# Patient Record
Sex: Male | Born: 1945 | Race: White | Hispanic: No | Marital: Married | Smoking: Former smoker
Health system: Southern US, Community
[De-identification: ages and names within clinical notes are randomized; demographics above are authoritative.]

## PROBLEM LIST (undated history)

## (undated) DIAGNOSIS — C801 Malignant (primary) neoplasm, unspecified: Secondary | ICD-10-CM

## (undated) DIAGNOSIS — R600 Localized edema: Secondary | ICD-10-CM

## (undated) DIAGNOSIS — Z8679 Personal history of other diseases of the circulatory system: Secondary | ICD-10-CM

## (undated) DIAGNOSIS — J449 Chronic obstructive pulmonary disease, unspecified: Secondary | ICD-10-CM

## (undated) DIAGNOSIS — E291 Testicular hypofunction: Secondary | ICD-10-CM

## (undated) DIAGNOSIS — R0602 Shortness of breath: Secondary | ICD-10-CM

## (undated) DIAGNOSIS — E785 Hyperlipidemia, unspecified: Secondary | ICD-10-CM

## (undated) DIAGNOSIS — M1990 Unspecified osteoarthritis, unspecified site: Secondary | ICD-10-CM

## (undated) DIAGNOSIS — I10 Essential (primary) hypertension: Secondary | ICD-10-CM

## (undated) DIAGNOSIS — R112 Nausea with vomiting, unspecified: Secondary | ICD-10-CM

## (undated) DIAGNOSIS — J42 Unspecified chronic bronchitis: Secondary | ICD-10-CM

## (undated) DIAGNOSIS — M25569 Pain in unspecified knee: Secondary | ICD-10-CM

## (undated) DIAGNOSIS — K439 Ventral hernia without obstruction or gangrene: Secondary | ICD-10-CM

## (undated) DIAGNOSIS — R51 Headache: Secondary | ICD-10-CM

## (undated) DIAGNOSIS — Z85828 Personal history of other malignant neoplasm of skin: Secondary | ICD-10-CM

## (undated) DIAGNOSIS — I714 Abdominal aortic aneurysm, without rupture: Secondary | ICD-10-CM

## (undated) DIAGNOSIS — K759 Inflammatory liver disease, unspecified: Secondary | ICD-10-CM

## (undated) DIAGNOSIS — G4730 Sleep apnea, unspecified: Secondary | ICD-10-CM

## (undated) DIAGNOSIS — R609 Edema, unspecified: Secondary | ICD-10-CM

## (undated) DIAGNOSIS — J189 Pneumonia, unspecified organism: Secondary | ICD-10-CM

## (undated) DIAGNOSIS — K219 Gastro-esophageal reflux disease without esophagitis: Secondary | ICD-10-CM

## (undated) DIAGNOSIS — Z9889 Other specified postprocedural states: Secondary | ICD-10-CM

## (undated) DIAGNOSIS — H43819 Vitreous degeneration, unspecified eye: Secondary | ICD-10-CM

## (undated) HISTORY — DX: Hyperlipidemia, unspecified: E78.5

## (undated) HISTORY — PX: SKIN SURGERY: SHX2413

## (undated) HISTORY — PX: FOOT TENDON SURGERY: SHX958

## (undated) HISTORY — DX: Morbid (severe) obesity due to excess calories: E66.01

## (undated) HISTORY — DX: Headache: R51

## (undated) HISTORY — DX: Malignant (primary) neoplasm, unspecified: C80.1

## (undated) HISTORY — DX: Shortness of breath: R06.02

## (undated) HISTORY — DX: Gastro-esophageal reflux disease without esophagitis: K21.9

## (undated) HISTORY — DX: Testicular hypofunction: E29.1

## (undated) HISTORY — DX: Vitreous degeneration, unspecified eye: H43.819

## (undated) HISTORY — DX: Essential (primary) hypertension: I10

## (undated) HISTORY — PX: KNEE SURGERY: SHX244

## (undated) HISTORY — DX: Unspecified osteoarthritis, unspecified site: M19.90

## (undated) HISTORY — DX: Abdominal aortic aneurysm, without rupture: I71.4

## (undated) HISTORY — DX: Personal history of other malignant neoplasm of skin: Z85.828

---

## 2000-02-06 ENCOUNTER — Encounter: Payer: Self-pay | Admitting: Orthopedic Surgery

## 2000-02-06 ENCOUNTER — Ambulatory Visit (HOSPITAL_COMMUNITY): Admission: RE | Admit: 2000-02-06 | Discharge: 2000-02-06 | Payer: Self-pay | Admitting: Orthopedic Surgery

## 2000-03-03 ENCOUNTER — Emergency Department (HOSPITAL_COMMUNITY): Admission: EM | Admit: 2000-03-03 | Discharge: 2000-03-03 | Payer: Self-pay | Admitting: Emergency Medicine

## 2000-03-05 ENCOUNTER — Emergency Department (HOSPITAL_COMMUNITY): Admission: EM | Admit: 2000-03-05 | Discharge: 2000-03-05 | Payer: Self-pay | Admitting: Emergency Medicine

## 2000-03-11 ENCOUNTER — Emergency Department (HOSPITAL_COMMUNITY): Admission: EM | Admit: 2000-03-11 | Discharge: 2000-03-11 | Payer: Self-pay | Admitting: Emergency Medicine

## 2000-08-31 HISTORY — PX: UMBILICAL HERNIA REPAIR: SHX196

## 2002-08-01 ENCOUNTER — Ambulatory Visit (HOSPITAL_BASED_OUTPATIENT_CLINIC_OR_DEPARTMENT_OTHER): Admission: RE | Admit: 2002-08-01 | Discharge: 2002-08-01 | Payer: Self-pay | Admitting: Surgery

## 2005-11-17 ENCOUNTER — Encounter: Admission: RE | Admit: 2005-11-17 | Discharge: 2005-11-17 | Payer: Self-pay | Admitting: Emergency Medicine

## 2010-08-31 HISTORY — PX: ELBOW SURGERY: SHX618

## 2011-02-26 ENCOUNTER — Ambulatory Visit (HOSPITAL_COMMUNITY): Payer: 59 | Attending: Emergency Medicine | Admitting: Radiology

## 2011-02-26 DIAGNOSIS — R0602 Shortness of breath: Secondary | ICD-10-CM | POA: Insufficient documentation

## 2011-02-26 DIAGNOSIS — R079 Chest pain, unspecified: Secondary | ICD-10-CM

## 2011-02-26 MED ORDER — TECHNETIUM TC 99M TETROFOSMIN IV KIT
33.0000 | PACK | Freq: Once | INTRAVENOUS | Status: AC | PRN
  Administered 2011-02-26: 33 via INTRAVENOUS

## 2011-02-26 NOTE — Progress Notes (Signed)
[facility] SITE 3 NUCLEAR MED [address] [place] [address] [phone]  Cardiology Nuclear Med Study  William Hebert is a 65 y.o. male [id number] [birth year]   Nuclear Med Background Indication for Stress Test:  Evaluation for Ischemia History:  '92 GXT:OK per patient. Cardiac Risk Factors: Hypertension, Lipids and Obesity  Symptoms:  Chest Pain (last episode of chest discomfort was about 2-weeks ago), DOE and Fatigue.  Patient still getting over shingles.   Nuclear Pre-Procedure Caffeine/Decaff Intake:  None NPO After: 8:00am   Lungs:  Diminished, but clear.  O2 sat 92-97% with deep breaths. IV 0.9% NS with Angio Cath:  20g  IV Site: R Hand  IV Started by:  Bonnita Levan, RN  Chest Size (in):  54 Cup Size: n/a  Height: 5\' 10"  (1.778 m)  Weight:  308 lb (139.708 kg)  BMI:  Body mass index is 44.19 kg/(m^2). Tech Comments:  N/A    Nuclear Med Study 1 or 2 day study: 2 day  Stress Test Type:  Stress  Reading MD:  Charlton Haws MD Order Authorizing Provider:  Earl Lites, MD  Resting Radionuclide: Technetium 30m Tetrofosmin  Resting Radionuclide Dose: 33 mCi   Stress Radionuclide:  Technetium 42m Tetrofosmin  Stress Radionuclide Dose: 33 mCi           Stress Protocol Rest HR: 74 Stress HR: 139  Rest BP: 111/78 Stress BP: 179/64  Exercise Time (min): 6:01 METS: 7.0   Predicted Max HR: 157 bpm % Max HR: 88.54 bpm Rate Pressure Product: 66440   Dose of Adenosine (mg):  n/a Dose of Lexiscan: n/a mg  Dose of Atropine (mg): n/a Dose of Dobutamine: n/a mcg/kg/min (at max HR)  Stress Test Technologist: Smiley Houseman, CMA-N  Nuclear Technologist:  Doyne Keel, CNMT     Rest Procedure:  Myocardial perfusion imaging was performed at rest 45 minutes following the intravenous administration of Technetium 81m Tetrofosmin.  Rest ECG: PAC, RAD and IRBBB.  Stress Procedure:  The patient exercised for 6:01 on the treadmill utilizing the Bruce protocol.  The  patient stopped due to fatigue and dyspnea.  Peak O2 sat was 96%.   He denied any chest pain.  There were no diagnostic ST-T wave changes, only rare PAC's and fusion beats.  Technetium 84m Tetrofosmin was injected at peak exercise and myocardial perfusion imaging was performed after a brief delay.  Stress ECG: No significant change from baseline ECG  QPS Raw Data Images:  Normal; no motion artifact; normal heart/lung ratio. Stress Images:  There is decreased uptake in the inferior wall. Rest Images:  There is decreased uptake in the inferior wall. Subtraction (SDS):  No reversibility is appreciated. Transient Ischemic Dilatation (Normal <1.22):  1.03 Lung/Heart Ratio (Normal <0.45):  .35  Quantitative Gated Spect Images QGS EDV:  116 ml QGS ESV:  46 ml QGS cine images:  NL LV Function; NL Wall Motion QGS EF: 60%  Impression Exercise Capacity:  Fair exercise capacity. BP Response:  Normal blood pressure response. Clinical Symptoms:  There is dyspnea. ECG Impression:  No significant ST segment change suggestive of ischemia. 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

## 2011-03-03 ENCOUNTER — Ambulatory Visit (HOSPITAL_COMMUNITY): Payer: 59 | Attending: Emergency Medicine | Admitting: Radiology

## 2011-03-03 DIAGNOSIS — R0989 Other specified symptoms and signs involving the circulatory and respiratory systems: Secondary | ICD-10-CM

## 2011-03-03 MED ORDER — TECHNETIUM TC 99M TETROFOSMIN IV KIT
33.0000 | PACK | Freq: Once | INTRAVENOUS | Status: AC | PRN
  Administered 2011-03-03: 33 via INTRAVENOUS

## 2011-03-09 NOTE — Progress Notes (Signed)
nuc med study routed to Dr. Daub.03/09/11 William Hebert

## 2011-03-16 ENCOUNTER — Telehealth: Payer: Self-pay | Admitting: Cardiovascular Disease

## 2011-03-16 NOTE — Telephone Encounter (Signed)
Dr.Daub wanted this patient to be seen w/in 3-5 days: Abn stress test,pos.old MI,CP.  [facility] does not have any openings that soon.  Should we WI?

## 2011-03-18 ENCOUNTER — Telehealth: Payer: Self-pay | Admitting: *Deleted

## 2011-03-18 NOTE — Telephone Encounter (Signed)
Left msg to call back and we will get an app sooner if needed as advised by urgent care, office number left on msg.Alfonso Ramus RN

## 2011-03-26 ENCOUNTER — Encounter: Payer: Self-pay | Admitting: Emergency Medicine

## 2011-04-06 ENCOUNTER — Telehealth: Payer: Self-pay

## 2011-04-06 ENCOUNTER — Emergency Department (HOSPITAL_COMMUNITY)
Admission: EM | Admit: 2011-04-06 | Discharge: 2011-04-06 | Disposition: A | Discharge status: Home / Self Care | Payer: 59 | Attending: Emergency Medicine | Admitting: Emergency Medicine

## 2011-04-06 ENCOUNTER — Emergency Department (HOSPITAL_COMMUNITY): Payer: 59

## 2011-04-06 ENCOUNTER — Other Ambulatory Visit: Payer: Self-pay | Admitting: Emergency Medicine

## 2011-04-06 DIAGNOSIS — G4733 Obstructive sleep apnea (adult) (pediatric): Secondary | ICD-10-CM | POA: Insufficient documentation

## 2011-04-06 DIAGNOSIS — I2789 Other specified pulmonary heart diseases: Secondary | ICD-10-CM | POA: Insufficient documentation

## 2011-04-06 DIAGNOSIS — R0602 Shortness of breath: Secondary | ICD-10-CM | POA: Insufficient documentation

## 2011-04-06 LAB — POCT I-STAT TROPONIN I: Troponin i, poc: 0.01 ng/mL (ref 0.00–0.08)

## 2011-04-06 LAB — POCT I-STAT, CHEM 8
BUN: 23 mg/dL (ref 6–23)
Calcium, Ion: 1.18 mmol/L (ref 1.12–1.32)
Chloride: 103 mEq/L (ref 96–112)
Creatinine, Ser: 1.3 mg/dL (ref 0.50–1.35)
Glucose, Bld: 112 mg/dL — ABNORMAL HIGH (ref 70–99)
HCT: 36 % — ABNORMAL LOW (ref 39.0–52.0)
Hemoglobin: 12.2 g/dL — ABNORMAL LOW (ref 13.0–17.0)
Potassium: 3.1 mEq/L — ABNORMAL LOW (ref 3.5–5.1)
Sodium: 142 mEq/L (ref 135–145)
TCO2: 27 mmol/L (ref 0–100)

## 2011-04-06 MED ORDER — XENON XE 133 GAS
10.0000 | GAS_FOR_INHALATION | Freq: Once | RESPIRATORY_TRACT | Status: AC | PRN
  Administered 2011-04-06: 10 via RESPIRATORY_TRACT

## 2011-04-06 MED ORDER — TECHNETIUM TO 99M ALBUMIN AGGREGATED
6.0000 | Freq: Once | INTRAVENOUS | Status: AC | PRN
  Administered 2011-04-06: 6 via INTRAVENOUS

## 2011-04-06 NOTE — Telephone Encounter (Signed)
lmomtcb x1 for susan 

## 2011-04-07 LAB — D-DIMER, QUANTITATIVE (NOT AT ARMC): D-Dimer, Quant: 1.23 ug/mL-FEU — ABNORMAL HIGH (ref 0.00–0.48)

## 2011-04-07 NOTE — Telephone Encounter (Signed)
LMTCB-shows in EPIC patient has an appt on 04-24-11 with [facility]-need to confirm this with Darl Pikes or if she is requesting a sooner appt.

## 2011-04-08 NOTE — Telephone Encounter (Signed)
lmomtcb  

## 2011-04-09 ENCOUNTER — Telehealth: Payer: Self-pay | Admitting: *Deleted

## 2011-04-09 NOTE — Telephone Encounter (Signed)
Lupita Leash with [facility] with Dr. Cleta Alberts called following up on phone message from 04/06/11 which has been signed off on.  Lupita Leash states pt already had 1 consult scheduled with RA which was moved up to Aug 24 with [facility].  She would like to know if pt can be seen sooner by any pulmonary dr.  Mauri Reading has openings on Aug 15 at 9:30am but I see pt is scheduled for sleep consult with [facility] on Aug 24.  I asked Lupita Leash about this and she states this is not a sleep consult but is a pulm consult for SOB.  I advised MW can see pt on Aug 15 but he is not a sleep dr -- she is ok with this.  Consult with [facility] for Aug 24 cancelled and rescheduled for Aug 15 at 9:30am with MW -- Lupita Leash aware pt needs to arrive 15 min early, bring all meds, and current insurance card if he has one.  She verbalized understanding of this and will inform pt.  Also, states she has already faxed over records on this pt.

## 2011-04-09 NOTE — Telephone Encounter (Signed)
LMTCB and will sign msg per protocol

## 2011-04-11 ENCOUNTER — Emergency Department (HOSPITAL_COMMUNITY): Payer: 59

## 2011-04-11 ENCOUNTER — Observation Stay (HOSPITAL_COMMUNITY)
Admission: EM | Admit: 2011-04-11 | Discharge: 2011-04-12 | DRG: 305 | Disposition: A | Discharge status: Home / Self Care | Payer: 59 | Attending: Cardiology | Admitting: Cardiology

## 2011-04-11 DIAGNOSIS — E785 Hyperlipidemia, unspecified: Secondary | ICD-10-CM | POA: Insufficient documentation

## 2011-04-11 DIAGNOSIS — R51 Headache: Secondary | ICD-10-CM | POA: Insufficient documentation

## 2011-04-11 DIAGNOSIS — I10 Essential (primary) hypertension: Principal | ICD-10-CM | POA: Insufficient documentation

## 2011-04-11 DIAGNOSIS — G4733 Obstructive sleep apnea (adult) (pediatric): Secondary | ICD-10-CM | POA: Insufficient documentation

## 2011-04-11 LAB — POCT I-STAT, CHEM 8
BUN: 24 mg/dL — ABNORMAL HIGH (ref 6–23)
Calcium, Ion: 1.15 mmol/L (ref 1.12–1.32)
Chloride: 100 mEq/L (ref 96–112)
Creatinine, Ser: 1.2 mg/dL (ref 0.50–1.35)
Glucose, Bld: 104 mg/dL — ABNORMAL HIGH (ref 70–99)
HCT: 38 % — ABNORMAL LOW (ref 39.0–52.0)
Hemoglobin: 12.9 g/dL — ABNORMAL LOW (ref 13.0–17.0)
Potassium: 3.3 mEq/L — ABNORMAL LOW (ref 3.5–5.1)
Sodium: 140 mEq/L (ref 135–145)
TCO2: 30 mmol/L (ref 0–100)

## 2011-04-11 LAB — TROPONIN I: Troponin I: <0.3 ng/mL (ref <0.30)

## 2011-04-11 LAB — POCT I-STAT TROPONIN I
Troponin i, poc: 0 ng/mL (ref 0.00–0.08)
Troponin i, poc: 0.02 ng/mL (ref 0.00–0.08)

## 2011-04-11 LAB — CK TOTAL AND CKMB (NOT AT ARMC)
CK, MB: 3.5 ng/mL (ref 0.3–4.0)
Relative Index: 1.9 (ref 0.0–2.5)
Total CK: 184 U/L (ref 7–232)

## 2011-04-12 LAB — HEMOGLOBIN A1C
Hgb A1c MFr Bld: 6.4 % — ABNORMAL HIGH (ref <5.7)
Mean Plasma Glucose: 137 mg/dL — ABNORMAL HIGH (ref <117)

## 2011-04-12 LAB — BASIC METABOLIC PANEL
BUN: 17 mg/dL (ref 6–23)
CO2: 32 mEq/L (ref 19–32)
Calcium: 9.6 mg/dL (ref 8.4–10.5)
Chloride: 99 mEq/L (ref 96–112)
Creatinine, Ser: 1.04 mg/dL (ref 0.50–1.35)
GFR calc Af Amer: >60 mL/min (ref >60)
GFR calc non Af Amer: >60 mL/min (ref >60)
Glucose, Bld: 106 mg/dL — ABNORMAL HIGH (ref 70–99)
Potassium: 3 mEq/L — ABNORMAL LOW (ref 3.5–5.1)
Sodium: 141 mEq/L (ref 135–145)

## 2011-04-12 LAB — CARDIAC PANEL(CRET KIN+CKTOT+MB+TROPI)
CK, MB: 3.5 ng/mL (ref 0.3–4.0)
Relative Index: 2 (ref 0.0–2.5)
Total CK: 177 U/L (ref 7–232)
Troponin I: <0.3 ng/mL (ref <0.30)

## 2011-04-12 LAB — PRO B NATRIURETIC PEPTIDE: Pro B Natriuretic peptide (BNP): 189 pg/mL — ABNORMAL HIGH (ref 0–125)

## 2011-04-13 ENCOUNTER — Encounter: Payer: Self-pay | Admitting: Internal Medicine

## 2011-04-13 ENCOUNTER — Ambulatory Visit (INDEPENDENT_AMBULATORY_CARE_PROVIDER_SITE_OTHER): Payer: 59 | Admitting: Internal Medicine

## 2011-04-13 VITALS — BP 126/78 | HR 57 | Temp 97.4°F | Ht 71.0 in | Wt 317.6 lb

## 2011-04-13 DIAGNOSIS — R0600 Dyspnea, unspecified: Secondary | ICD-10-CM

## 2011-04-13 DIAGNOSIS — Z87891 Personal history of nicotine dependence: Secondary | ICD-10-CM | POA: Insufficient documentation

## 2011-04-13 DIAGNOSIS — R0989 Other specified symptoms and signs involving the circulatory and respiratory systems: Secondary | ICD-10-CM

## 2011-04-13 DIAGNOSIS — G4733 Obstructive sleep apnea (adult) (pediatric): Secondary | ICD-10-CM | POA: Insufficient documentation

## 2011-04-13 NOTE — Patient Instructions (Signed)
Take omeprazole 2 tablets 30-60 min before bfast if possible   Add pepcid 20 mg one at bedtime  GERD (REFLUX)  is an extremely common cause of respiratory symptoms, many times with no significant heartburn at all.    It can be treated with medication, but also with lifestyle changes including avoidance of late meals, excessive alcohol, smoking cessation, and avoid fatty foods, chocolate, peppermint, colas, red wine, and acidic juices such as orange juice.  NO MINT OR MENTHOL PRODUCTS SO NO COUGH DROPS  USE SUGARLESS CANDY INSTEAD (jolley ranchers or Stover's)  NO OIL BASED VITAMINS (NO fish oil)  Please schedule a follow up office visit in 4 weeks, sooner if needed with PFT's   In meantime avoid anything you know will bother your airways.

## 2011-04-13 NOTE — Progress Notes (Signed)
Subjective:     Patient ID: William Hebert, male   DOB: December 09, 1945, 65 y.o.   MRN: [id number]  HPI  80 yowm quit smoking 2007 with a h/o chronic bronchitis that mostly resolved then  no further resp problems problems at all @  around 280 lbs at that point could still do some jogging and still able to do yardowork until summer 2012.   04/13/2011 Initial pulmonary office eval in EMR era for new sob that occurred p cleaned out a house 3 weeks prior to ov, exposed  X  Two 8 hours work days and now improving but still sob room to room so went in for eval by Dr Cleta Alberts rec sleep study and v/q scan neg started cpap 04/08/11 then went to 04/11/11 with ha and double viz and high bp.  Assoc with severe cough x one week. No better with B2, no sign sputum production.  Sleeping much better on cpap.  Pt denies any significant sore throat, dysphagia, itching, sneezing,  nasal congestion or excess/ purulent secretions,  fever, chills, sweats, unintended wt loss, pleuritic or exertional cp, hempoptysis, orthopnea pnd or leg swelling.    Also denies any obvious fluctuation of symptoms with weather or environmental changes or other aggravating or alleviating factors.             Review of Systems     Objective:   Physical Exam Obese wm nad wt 312 04/13/11 HEENT: nl dentition, turbinates, and orophanx. Nl external ear canals without cough reflex   NECK :  without JVD/Nodes/TM/ nl carotid upstrokes bilaterally   LUNGS: no acc muscle use, clear to A and P bilaterally though    bs distant bilaterally, no increase in exp time,  without cough on insp or exp maneuvers   CV:  RRR  no s3 or murmur or increase in P2, no edema   ABD:  soft and nontender but massively obese with limied excursion in the supine position. No bruits or organomegaly, bowel sounds nl  MS:  warm without deformities, calf tenderness, cyanosis or clubbing  SKIN: warm and dry without lesions    NEURO:  alert, approp, no deficits        CXR 04/11/11 Cardiomegaly without acute disease.  BNP 04/12/11  189 Assessment:         Plan:

## 2011-04-14 ENCOUNTER — Telehealth: Payer: Self-pay | Admitting: Internal Medicine

## 2011-04-14 ENCOUNTER — Encounter: Payer: Self-pay | Admitting: Internal Medicine

## 2011-04-14 NOTE — Telephone Encounter (Signed)
Advised that the pepcid is OTC. Carron Curie, CMA

## 2011-04-14 NOTE — Assessment & Plan Note (Signed)
Unable to reproduce this in office but was assoc with severe cough and didn't respond to albuterol so this is most    Classic Upper airway cough syndrome, so named because it's frequently impossible to sort out how much is  CR/sinusitis with freq throat clearing (which can be related to primary GERD)   vs  causing  secondary (" extra esophageal")  GERD from wide swings in gastric pressure that occur with throat clearing, often  promoting self use of mint and menthol lozenges that reduce the lower esophageal sphincter tone and exacerbate the problem further in a cyclical fashion.   These are the same pts who not infrequently have failed to tolerate ace inhibitors,  dry powder inhalers or biphosphonates or report having reflux symptoms that don't respond to standard doses of PPI , and are easily confused as having aecopd or asthma flares,   Rec rx as gerd with ppi and diet and then return for pft's. I do not think he has significant or limiting copd

## 2011-04-14 NOTE — Assessment & Plan Note (Signed)
Better on cpap, deferred all questions re rx to Dr Nadara Eaton and if sleep evaluation requested from pulmonary will refer to one of my colleagues who does sleep

## 2011-04-14 NOTE — Telephone Encounter (Signed)
SPOUSE CALLED BACK AGAIN. UPSET THAT THIS RX WAS TO HAVE BEEN SENT IN YESTERDAY. SHE HAS BEEN TO THE PHARMACY TWICE NOW. WILL BE GOING BACK SOON AND WANTS TO PICK THIS UP ASAP. [facility] PH# [phone]. Hazel Sams

## 2011-04-15 ENCOUNTER — Ambulatory Visit (INDEPENDENT_AMBULATORY_CARE_PROVIDER_SITE_OTHER): Payer: 59 | Admitting: Internal Medicine

## 2011-04-15 ENCOUNTER — Institutional Professional Consult (permissible substitution): Payer: 59 | Admitting: Internal Medicine

## 2011-04-15 DIAGNOSIS — R0989 Other specified symptoms and signs involving the circulatory and respiratory systems: Secondary | ICD-10-CM

## 2011-04-15 DIAGNOSIS — R0600 Dyspnea, unspecified: Secondary | ICD-10-CM

## 2011-04-15 DIAGNOSIS — R0609 Other forms of dyspnea: Secondary | ICD-10-CM

## 2011-04-15 LAB — PULMONARY FUNCTION TEST

## 2011-04-15 NOTE — Progress Notes (Signed)
PFT done today. 

## 2011-04-15 NOTE — Discharge Summary (Signed)
NAMEHARSHAN, KEARLEY NO.:  [id number]  MEDICAL RECORD NO.:  [id number]  LOCATION:  3743                         FACILITY:  [facility]  PHYSICIAN:  Pamella Pert, MD DATE OF BIRTH:  [birth year]  DATE OF ADMISSION:  04/11/2011 DATE OF DISCHARGE:  04/12/2011                              DISCHARGE SUMMARY   23-hour admit.  BRIEF HISTORY:  Mr. Levy Cedano is a morbidly obese hypertensive 65- year-old gentleman who had been having recurrence of double vision, headache, and had presented to [facility] 2 days ago with uncontrolled hypertension.  He was treated with anti-hypertensive medications and he was eventually discharged home.  Because of shortness of breath, he also had a VQ scan of his lungs, which was negative for pulmonary embolism.  Yesterday again he had gone to work and suddenly had been having continued headaches for the last few days and it has gotten worse yesterday while at work.  His blood pressure at work was somewhere in the range of 190/123 mmHg.  His wife had called me and I had advised him to go to the emergency department to evaluate for hypertensive urgency.  In the emergency room, the patient was evaluated with a CT scan of the head, which essentially was unremarkable.  Chest x-ray revealed mild cardiomegaly without any acute cardiopulmonary disease.  The patient was admitted to telemetry.  COURSE IN THE HOSPITAL:  The patient's blood pressure immediately stabilized while in the hospital.  He received his usual medications along with addition of nifedipine 60 mg p.o. daily.  He did receive extra dose of Bystolic at 20 mg once a day.  With this, the blood pressure was better controlled.  The following day, that is today, he is asymptomatic and feels well.  He has no significant visual disturbances.  No nausea, no vomiting.  He still has mild headache.  Overall, he feels well.  PHYSICAL EXAMINATION:  VITAL SIGNS:   Temperature of 97.5, pulse was 51 beats per minute and regular, respiration was 16, blood pressure 154/80 mmHg. CARDIAC:  S1 and S2 was normal without any gallop or murmur. CHEST:  Clear. ABDOMEN:  Soft, obese.  No obvious organomegaly. EXTREMITIES:  Full range of movements without any edema or tenderness. VASCULAR:  Normal. NEUROLOGICAL:  There was no gross deficits.  His EKG demonstrated sinus bradycardia.  IMPRESSION: 1. Hypertensive urgency with headache, which resolved with     reinitiation of his Procardia XL, which he was on previously. 2. Morbid obesity with obstructive sleep apnea.  The patient has not     started using his CPAP yet. 3. Hyperlipidemia.  RECOMMENDATIONS:  At this point, the patient is felt stable for discharge.  I advised him not to do any heavy lifting.  I will see him back in the office in about a week.  DISCHARGE MEDICATIONS: 1. Procardia XL 60 mg p.o. daily. 2. Aspirin 81 mg p.o. daily. 3. Bystolic 10 mg p.o. daily. 4. Fish oil capsule 1200 mg p.o. daily. 5. Omeprazole 20 mg p.o. daily. 6. Osteo Bi-Flex one p.o. daily. 7. Potassium chloride 10 mEq p.o. daily. 8. Pravachol 40 mg p.o. at bedtime. 9. Testosterone  1% gel once a day.  Please also note the office note history and physical is also in the chart for the patient.     Pamella Pert, MD     JRG/MEDQ  D:  04/12/2011  T:  04/12/2011  Job:  [id number]  cc:   Brett Canales A. Cleta Alberts, M.D.  Electronically Signed by Yates Decamp MD on 04/15/2011 07:02:03 PM

## 2011-04-24 ENCOUNTER — Institutional Professional Consult (permissible substitution): Payer: 59 | Admitting: Pulmonary Disease

## 2011-04-29 ENCOUNTER — Other Ambulatory Visit: Payer: Self-pay | Admitting: Cardiology

## 2011-04-29 DIAGNOSIS — D3500 Benign neoplasm of unspecified adrenal gland: Secondary | ICD-10-CM

## 2011-05-06 ENCOUNTER — Ambulatory Visit
Admission: RE | Admit: 2011-05-06 | Discharge: 2011-05-06 | Disposition: A | Discharge status: Home / Self Care | Payer: 59 | Source: Ambulatory Visit | Attending: Cardiology | Admitting: Cardiology

## 2011-05-06 DIAGNOSIS — D3500 Benign neoplasm of unspecified adrenal gland: Secondary | ICD-10-CM

## 2011-05-07 ENCOUNTER — Encounter: Payer: Self-pay | Admitting: Internal Medicine

## 2011-05-08 ENCOUNTER — Institutional Professional Consult (permissible substitution): Payer: 59 | Admitting: Pulmonary Disease

## 2011-05-11 ENCOUNTER — Encounter: Payer: Self-pay | Admitting: Vascular Surgery

## 2011-05-11 ENCOUNTER — Ambulatory Visit: Payer: 59 | Admitting: Internal Medicine

## 2011-05-12 ENCOUNTER — Encounter: Payer: Self-pay | Admitting: Vascular Surgery

## 2011-05-12 ENCOUNTER — Ambulatory Visit (INDEPENDENT_AMBULATORY_CARE_PROVIDER_SITE_OTHER): Payer: 59 | Admitting: Vascular Surgery

## 2011-05-12 VITALS — BP 122/71 | HR 63 | Resp 22 | Ht 71.0 in | Wt 315.0 lb

## 2011-05-12 DIAGNOSIS — I714 Abdominal aortic aneurysm, without rupture: Secondary | ICD-10-CM

## 2011-05-12 NOTE — Progress Notes (Signed)
Subjective:     Patient ID: William Hebert, male   DOB: August 19, 1946, 65 y.o.   MRN: [id number]  HPI this 65 year old morbidly obese male patient was recently found to have a large abdominal aortic aneurysm. It measures 7 cm in maximum diameter. He had no previous knowledge of the aneurysm. He was evaluated by Dr. Nadara Eaton for severe hypertension which has been controlled. He had a cardiac evaluation including a nuclear stress test which was normal. He does have sleep apnea and uses a CPAP machine at night for the past 3 weeks. He has not been having abdominal or back symptoms.  Past Medical History  Diagnosis Date  . HTN (hypertension)   . GERD (gastroesophageal reflux disease)   . Testosterone deficiency   . Hyperlipidemia   . Morbidly obese   . Headache   . Double vision   . Shortness of breath   . Arthritis     History  Substance Use Topics  . Smoking status: Former Smoker -- 2.0 packs/day for 40 years    Types: Cigarettes    Quit date: 08/31/2005  . Smokeless tobacco: Not on file  . Alcohol Use: No    Family History  Problem Relation Age of Onset  . Cancer Mother   . Heart disease Maternal Aunt     No Known Allergies  Current outpatient prescriptions:aspirin 81 MG tablet, Take 81 mg by mouth daily.  , Disp: , Rfl: ;  BYSTOLIC 10 MG tablet, Take 1 tablet by mouth 2 (two) times daily. , Disp: , Rfl: ;  Misc Natural Products (OSTEO BI-FLEX ADV DOUBLE ST PO), Take by mouth.  , Disp: , Rfl: ;  NIFEDICAL XL 60 MG 24 hr tablet, Take 1 tablet by mouth Daily., Disp: , Rfl: ;  omeprazole (PRILOSEC) 20 MG capsule, Take 1 tablet by mouth Twice daily., Disp: , Rfl:  pravastatin (PRAVACHOL) 40 MG tablet, Take 1 tablet by mouth Twice daily., Disp: , Rfl: ;  spironolactone (ALDACTONE) 25 MG tablet, Take 25 mg by mouth daily.  , Disp: , Rfl: ;  TESTIM 50 MG/5GM GEL, 1 Tube Daily., Disp: , Rfl: ;  triamterene-hydrochlorothiazide (DYAZIDE) 37.5-25 MG per capsule, Take 1 capsule by mouth every morning.   , Disp: , Rfl: ;  diclofenac (VOLTAREN) 75 MG EC tablet, Take 1 tablet by mouth Twice daily., Disp: , Rfl:  fish oil-omega-3 fatty acids 1000 MG capsule, Take 1,200 mg by mouth daily.  , Disp: , Rfl: ;  potassium chloride (K-DUR,KLOR-CON) 10 MEQ tablet, Take 1 tablet by mouth Daily., Disp: , Rfl:   BP 122/71  Pulse 63  Resp 22  Ht 5\' 11"  (1.803 m)  Wt 315 lb (142.883 kg)  BMI 43.93 kg/m2  Body mass index is 43.93 kg/(m^2).        Review of Systems he complains of a 40 pound weight gain in the last 6 months, dizziness, occasional headaches, arthritis, joint pain, orthopnea, dyspnea on exertion, reflux esophagitis, urinary frequency, all other systems are negative a complete review of systems     Objective:   Physical Exam blood pressure 120/71 heart rate 63 respirations 22 Generally he is a morbidly obese male patient is in no apparent distress alert and oriented x3 HEENT exam normal for age Chest clear no rhonchi or wheezing Cardiovascular regular rhythm no murmurs Abdomen is obese no palpable masses Neurologic exams normal Skin free of rashes Extremities exam reveals 3+ femoral and posterior tibial pulses palpable. Musculoskeletal free of major deformities  I reviewed his CT scan of the abdomen without contrast. He has a long infrarenal neck in a very focal saccular abdominal aortic aneurysm measuring 7 cm in diameter with good iliac vessels    Assessment:    large infrarenal abdominal aortic aneurysm-good stent graft candidate    Plan:     Plan endovascular stent graft repair of abdominal aortic aneurysm on Wednesday, September 19. Risks and benefits of them thoroughly discussed with the patient and he would like to proceed

## 2011-05-18 ENCOUNTER — Telehealth: Payer: Self-pay | Admitting: Internal Medicine

## 2011-05-18 ENCOUNTER — Other Ambulatory Visit: Payer: Self-pay | Admitting: Vascular Surgery

## 2011-05-18 NOTE — Telephone Encounter (Signed)
Ms. Coby returned triage call.  Please call back after 12:15 pm today at [phone].  Antionette Fairy

## 2011-05-18 NOTE — Telephone Encounter (Signed)
ATC William Hebert's call, mailbox full so will have to try calling back

## 2011-05-18 NOTE — Telephone Encounter (Signed)
Spoke with pt's spouse and advised PFT not signed yet and MW back in the office tomorrow so will have him address then. She states needs these results asap b/c pt having surgery to repair AAA and we will need to have results faxed to Drs Jacinto Halim and Cleta Alberts. Please advise, thanks!

## 2011-05-18 NOTE — Telephone Encounter (Signed)
See previous telephone note. 

## 2011-05-18 NOTE — Telephone Encounter (Signed)
Tell pt :  Sorry for the delay not sure why it happened but the good news is the pft's are normal and Dr Hart Rochester  Has a copy on his computer (I will send message to [place] in epic).  Dr Charleston Poot needs a copy faxed.  No pulmonary contraindication to surgery.

## 2011-05-18 NOTE — Telephone Encounter (Signed)
Pt is aware PFT is normal per Dr. Sherene Sires and Dr. Hart Rochester has a copy as well as Dr. Charleston Poot.

## 2011-05-19 ENCOUNTER — Other Ambulatory Visit: Payer: Self-pay | Admitting: Vascular Surgery

## 2011-05-19 ENCOUNTER — Encounter (HOSPITAL_COMMUNITY)
Admission: RE | Admit: 2011-05-19 | Discharge: 2011-05-19 | Disposition: A | Discharge status: Home / Self Care | Payer: 59 | Source: Ambulatory Visit | Attending: Vascular Surgery | Admitting: Vascular Surgery

## 2011-05-19 ENCOUNTER — Ambulatory Visit (HOSPITAL_COMMUNITY)
Admission: RE | Admit: 2011-05-19 | Discharge: 2011-05-19 | Disposition: A | Discharge status: Home / Self Care | Payer: 59 | Source: Ambulatory Visit | Attending: Vascular Surgery | Admitting: Vascular Surgery

## 2011-05-19 DIAGNOSIS — I714 Abdominal aortic aneurysm, without rupture: Secondary | ICD-10-CM

## 2011-05-19 LAB — CBC
HCT: 39 % (ref 39.0–52.0)
Hemoglobin: 12.8 g/dL — ABNORMAL LOW (ref 13.0–17.0)
MCH: 29.6 pg (ref 26.0–34.0)
MCHC: 32.8 g/dL (ref 30.0–36.0)
MCV: 90.1 fL (ref 78.0–100.0)
Platelets: 176 K/uL (ref 150–400)
RBC: 4.33 MIL/uL (ref 4.22–5.81)
RDW: 12.7 % (ref 11.5–15.5)
WBC: 7.1 K/uL (ref 4.0–10.5)

## 2011-05-19 LAB — URINALYSIS, ROUTINE W REFLEX MICROSCOPIC
Bilirubin Urine: NEGATIVE
Glucose, UA: NEGATIVE mg/dL
Hgb urine dipstick: NEGATIVE
Ketones, ur: NEGATIVE mg/dL
Leukocytes, UA: NEGATIVE
Nitrite: NEGATIVE
Protein, ur: NEGATIVE mg/dL
Specific Gravity, Urine: 1.015 (ref 1.005–1.030)
Urobilinogen, UA: 1 mg/dL (ref 0.0–1.0)
pH: 7 (ref 5.0–8.0)

## 2011-05-19 LAB — BLOOD GAS, ARTERIAL
Acid-Base Excess: 5 mmol/L — ABNORMAL HIGH (ref 0.0–2.0)
Bicarbonate: 29.3 meq/L — ABNORMAL HIGH (ref 20.0–24.0)
Drawn by: 344381
FIO2: 0.21 %
O2 Saturation: 94.9 %
Patient temperature: 98.6
TCO2: 30.6 mmol/L (ref 0–100)
pCO2 arterial: 45.1 mmHg — ABNORMAL HIGH (ref 35.0–45.0)
pH, Arterial: 7.428 (ref 7.350–7.450)
pO2, Arterial: 69.7 mmHg — ABNORMAL LOW (ref 80.0–100.0)

## 2011-05-19 LAB — PROTIME-INR
INR: 0.91 (ref 0.00–1.49)
Prothrombin Time: 12.5 seconds (ref 11.6–15.2)

## 2011-05-19 LAB — COMPREHENSIVE METABOLIC PANEL
ALT: 27 U/L (ref 0–53)
AST: 20 U/L (ref 0–37)
Albumin: 3.7 g/dL (ref 3.5–5.2)
Alkaline Phosphatase: 76 U/L (ref 39–117)
BUN: 26 mg/dL — ABNORMAL HIGH (ref 6–23)
CO2: 29 mEq/L (ref 19–32)
Calcium: 10 mg/dL (ref 8.4–10.5)
Chloride: 100 mEq/L (ref 96–112)
Creatinine, Ser: 1.28 mg/dL (ref 0.50–1.35)
GFR calc Af Amer: >60 mL/min (ref >60)
GFR calc non Af Amer: 57 mL/min — ABNORMAL LOW (ref >60)
Glucose, Bld: 103 mg/dL — ABNORMAL HIGH (ref 70–99)
Potassium: 4.2 mEq/L (ref 3.5–5.1)
Sodium: 138 mEq/L (ref 135–145)
Total Bilirubin: 0.3 mg/dL (ref 0.3–1.2)
Total Protein: 6.7 g/dL (ref 6.0–8.3)

## 2011-05-19 LAB — SURGICAL PCR SCREEN
MRSA, PCR: NEGATIVE
Staphylococcus aureus: NEGATIVE

## 2011-05-19 LAB — ABO/RH: ABO/RH(D): O POS

## 2011-05-19 LAB — APTT: aPTT: 28 s (ref 24–37)

## 2011-05-20 ENCOUNTER — Inpatient Hospital Stay (HOSPITAL_COMMUNITY): Payer: 59

## 2011-05-20 ENCOUNTER — Inpatient Hospital Stay (HOSPITAL_COMMUNITY)
Admission: RE | Admit: 2011-05-20 | Discharge: 2011-05-21 | DRG: 238 | Disposition: A | Discharge status: Home / Self Care | Payer: 59 | Source: Ambulatory Visit | Attending: Vascular Surgery | Admitting: Vascular Surgery

## 2011-05-20 DIAGNOSIS — I714 Abdominal aortic aneurysm, without rupture, unspecified: Principal | ICD-10-CM | POA: Diagnosis present

## 2011-05-20 DIAGNOSIS — Z01818 Encounter for other preprocedural examination: Secondary | ICD-10-CM

## 2011-05-20 DIAGNOSIS — E785 Hyperlipidemia, unspecified: Secondary | ICD-10-CM | POA: Diagnosis present

## 2011-05-20 DIAGNOSIS — I10 Essential (primary) hypertension: Secondary | ICD-10-CM | POA: Diagnosis present

## 2011-05-20 DIAGNOSIS — Z01810 Encounter for preprocedural cardiovascular examination: Secondary | ICD-10-CM

## 2011-05-20 DIAGNOSIS — Z7982 Long term (current) use of aspirin: Secondary | ICD-10-CM

## 2011-05-20 DIAGNOSIS — K219 Gastro-esophageal reflux disease without esophagitis: Secondary | ICD-10-CM | POA: Diagnosis present

## 2011-05-20 DIAGNOSIS — Z01812 Encounter for preprocedural laboratory examination: Secondary | ICD-10-CM

## 2011-05-20 DIAGNOSIS — M129 Arthropathy, unspecified: Secondary | ICD-10-CM | POA: Diagnosis present

## 2011-05-20 DIAGNOSIS — G4733 Obstructive sleep apnea (adult) (pediatric): Secondary | ICD-10-CM | POA: Diagnosis present

## 2011-05-20 HISTORY — PX: ABDOMINAL AORTIC ANEURYSM REPAIR: SHX42

## 2011-05-20 LAB — BASIC METABOLIC PANEL
BUN: 30 mg/dL — ABNORMAL HIGH (ref 6–23)
CO2: 30 mEq/L (ref 19–32)
Calcium: 9.1 mg/dL (ref 8.4–10.5)
Chloride: 102 mEq/L (ref 96–112)
Creatinine, Ser: 1.48 mg/dL — ABNORMAL HIGH (ref 0.50–1.35)
GFR calc Af Amer: 58 mL/min — ABNORMAL LOW (ref >60)
GFR calc non Af Amer: 48 mL/min — ABNORMAL LOW (ref >60)
Glucose, Bld: 111 mg/dL — ABNORMAL HIGH (ref 70–99)
Potassium: 3.6 mEq/L (ref 3.5–5.1)
Sodium: 140 mEq/L (ref 135–145)

## 2011-05-20 LAB — CBC
HCT: 37.4 % — ABNORMAL LOW (ref 39.0–52.0)
Hemoglobin: 12.5 g/dL — ABNORMAL LOW (ref 13.0–17.0)
MCH: 30.6 pg (ref 26.0–34.0)
MCHC: 33.4 g/dL (ref 30.0–36.0)
MCV: 91.4 fL (ref 78.0–100.0)
Platelets: 141 10*3/uL — ABNORMAL LOW (ref 150–400)
RBC: 4.09 MIL/uL — ABNORMAL LOW (ref 4.22–5.81)
RDW: 13 % (ref 11.5–15.5)
WBC: 7.7 10*3/uL (ref 4.0–10.5)

## 2011-05-20 LAB — MAGNESIUM: Magnesium: 2.2 mg/dL (ref 1.5–2.5)

## 2011-05-20 LAB — PROTIME-INR
INR: 1.07 (ref 0.00–1.49)
Prothrombin Time: 14.1 seconds (ref 11.6–15.2)

## 2011-05-20 LAB — APTT: aPTT: 32 seconds (ref 24–37)

## 2011-05-21 ENCOUNTER — Other Ambulatory Visit: Payer: Self-pay

## 2011-05-21 ENCOUNTER — Other Ambulatory Visit: Payer: Self-pay | Admitting: Physician Assistant

## 2011-05-21 DIAGNOSIS — I714 Abdominal aortic aneurysm, without rupture: Secondary | ICD-10-CM

## 2011-05-21 LAB — BASIC METABOLIC PANEL
BUN: 18 mg/dL (ref 6–23)
CO2: 29 mEq/L (ref 19–32)
Calcium: 9.1 mg/dL (ref 8.4–10.5)
Chloride: 97 mEq/L (ref 96–112)
Creatinine, Ser: 1.12 mg/dL (ref 0.50–1.35)
GFR calc Af Amer: >60 mL/min (ref >60)
GFR calc non Af Amer: >60 mL/min (ref >60)
Glucose, Bld: 154 mg/dL — ABNORMAL HIGH (ref 70–99)
Potassium: 3.5 mEq/L (ref 3.5–5.1)
Sodium: 136 mEq/L (ref 135–145)

## 2011-05-21 LAB — CBC
HCT: 37.3 % — ABNORMAL LOW (ref 39.0–52.0)
Hemoglobin: 12.8 g/dL — ABNORMAL LOW (ref 13.0–17.0)
MCH: 31.1 pg (ref 26.0–34.0)
MCHC: 34.3 g/dL (ref 30.0–36.0)
MCV: 90.8 fL (ref 78.0–100.0)
Platelets: 151 10*3/uL (ref 150–400)
RBC: 4.11 MIL/uL — ABNORMAL LOW (ref 4.22–5.81)
RDW: 12.8 % (ref 11.5–15.5)
WBC: 10.2 10*3/uL (ref 4.0–10.5)

## 2011-05-22 LAB — CROSSMATCH
ABO/RH(D): O POS
Antibody Screen: NEGATIVE
Unit division: 0
Unit division: 0

## 2011-05-25 NOTE — Discharge Summary (Signed)
William Hebert, William Hebert NO.:  [id number]  MEDICAL RECORD NO.:  [id number]  LOCATION:  3316                         FACILITY:  [facility]  PHYSICIAN:  Quita Skye. Hart Rochester, M.D.  DATE OF BIRTH:  Mar 16, 1946  DATE OF ADMISSION:  05/20/2011 DATE OF DISCHARGE:  05/21/2011                              DISCHARGE SUMMARY   ADMISSION DIAGNOSIS:  Abdominal aortic aneurysm.  HISTORY OF PRESENT ILLNESS:  This is a 65 year old morbidly obese male who was recently found to have a large abdominal aortic aneurysm.  It measures 7 cm in maximum diameter.  He had no previous knowledge of the aneurysm.  He was evaluated by Dr. Jacinto Halim for severe hypertension which has been controlled.  He had a cardiac evaluation including a nuclear stress test which was normal.  He does have sleep apnea and uses a CPAP machine at night for the past 3 weeks.  He has not been having any abdominal or back symptoms.  HOSPITAL COURSE:  The patient was admitted to the hospital, taken to the operating room on May 20, 2011 where he underwent abdominal aortic aneurysm endovascular stent graft repair.  He tolerated the procedure well and was transported to the recovery room in satisfactory condition. By postoperative day #1, he was doing well.  He had strong pedal pulse signals with Doppler and bilateral groins were soft without hematoma and he does have good urine output.  His BUN and creatinine have improved at 18 and 1.12 respectively.  Otherwise, his postoperative course including increasing ambulation as well as increasing intake of solids without difficulty.  DISCHARGE INSTRUCTIONS:  He is discharged home with extensive instructions on wound care and progressive ambulation.  He is instructed not to drive or perform any heavy lifting for 1 month. When he is not showering,  he is to keep his bilateral groins dry with keeping a washcloth between his pannus  and groin.  DISCHARGE DIAGNOSES: 1. Abdominal  aortic aneurysm.     a.     Status post endovascular stent graft repair of abdominal      aortic aneurysm, May 20, 2011. 2. Hypertension. 3. Gastroesophageal reflux disease. 4. Testosterone deficiency. 5. Hyperlipidemia. 6. Morbid obesity. 7. Headaches. 8. History of double vision. 9. Shortness of breath. 10.Arthritis. 11.Obstructive sleep apnea and he does use a CPAP. 12.Remote tobacco use.  DISCHARGE MEDICATIONS: 1. Oxycodone 5 mg 1-2 tablets p.o. q.4-6 h. p.r.n. pain, #30, no     refill. 2. Spironolactone 25 mg p.o. daily. 3. Aspirin 81 mg p.o. daily. 4. Bystolic 10 mg p.o. twice a day. 5. Diclofenac 75 mg p.o. daily. 6. Famotidine 20 mg p.o. nightly. 7. Nifedipine 60 mg XL 1 tablet p.o. daily. 8. Omeprazole 20 mg 2 tablets p.o. q.a.m. 9. Osteo Bi-Flex 1 tablet daily. 10.Potassium chloride 10 mEq p.o. daily. 11.Pravachol 40 mg p.o. nightly. 12.Testosterone 1% gel one application to the skin daily. 13.Tramadol 50 mg p.o. t.i.d. 14.Triamterene/hydrochlorothiazide 37.5/25 mg p.o. q.a.m.  FOLLOWUP:  The patient will follow up with Dr. Hart Rochester in 4 weeks with postoperative stent graft protocol.     Newton Pigg, PA   ______________________________ Quita Skye Hart Rochester, M.D.    SE/MEDQ  D:  05/21/2011  T:  05/21/2011  Job:  [id number]  Electronically Signed by Newton Pigg PA on 05/21/2011 10:46:02 AM Electronically Signed by William Hebert M.D. on 05/25/2011 12:49:36 PM

## 2011-05-25 NOTE — Op Note (Signed)
NAMEHIEP, OLLIS NO.:  [id number]  MEDICAL RECORD NO.:  [id number]  LOCATION:  3316                         FACILITY:  [facility]  PHYSICIAN:  Quita Skye. Hart Rochester, M.D.  DATE OF BIRTH:  06/21/1946  DATE OF PROCEDURE:  05/20/2011 DATE OF DISCHARGE:                              OPERATIVE REPORT   PREOPERATIVE DIAGNOSIS:  Infrarenal abdominal aortic aneurysm.  POSTOPERATIVE DIAGNOSIS:  Infrarenal abdominal aortic aneurysm.  OPERATION:  Insertion of an aorto-bi-common iliac stent graft (EVAR) using a Gore-Excluder graft via bilateral common femoral artery percutaneous approach graft.  GRAFTS USED:  A.  A 31 x 14 x 17 cm main body via right side. B.  A 14 x 14 cm left contralateral limb. C.  A 32 x 4 cm proximal cuff with completion angiography and closure using ProGlide Perclose device.  SURGEONS: 1. Quita Skye. Hart Rochester, MD 2. Juleen China IV, MD  ANESTHESIA:  General endotracheal.  ESTIMATED BLOOD LOSS:  200 mL.  DESCRIPTION OF PROCEDURE:  The patient was taken to the operating room and placed in supine position at which time satisfactory general endotracheal anesthesia was administered.  Monitoring lines were inserted by Anesthesia.  The abdomen and groins were prepped with Betadine scrub and solution and draped in routine sterile manner. Bilateral common femoral artery cannulation was performed under ultrasound guidance with insertion of 2 ProGlide Perclose sutures, one at 11 o'clock and one at the 1 o'clock position on each side.  After insertion of the guidewires and placement of the Perclose sutures, 8- French sheath short on the right and long on the left was then placed over the guidewire.  Omni flush catheter was passed up the left side over the guidewire and an angiography was performed to measure appropriate lengths for the graft.  This was done using 20 mL of contrast at 20 mL per second demonstrating the aneurysm and the origin of both  hypogastric arteries.  Following this, a 31 x 14 mm x 17 cm graft was selected to deploy via the right side.  Prior to this, a wire exchange on the right was performed and an Amplatz Super Stiff wire inserted.  A 20-French sheath passed over the Amplatz wire and the device then positioned in the pararenal aorta.  A second angiogram was performed to verify the exact location of the renal arteries.  The graft was then deployed just distal to the renal arteries.  There was a 3-cm neck.  Following completion of this and deployment down to the contralateral gate, the contralateral gate was cannulated.  Because of the size of the patient, it was necessary to insert a 12-French dry seal sheath to facilitate cannulation of the contralateral gate.  This was done using the buddy-wire technique.  Following cannulation of the contralateral gate, a retrograde angiogram was performed to measure length and a 14 x 14 cm contralateral limb was selected, passed through the sheath, sheath retracted, and the limb deployed appropriately down to a point just proximal to left hypogastric.  The right ipsilateral limb was then completely deployed and all junctions were dilated with the Q-50 balloon.  Following this, a completion angiogram was performed which revealed  a small either dissection or type 1 leak along the left lateral proximal wall.  Additional gentle dilatation with the balloon catheter did not resolve this, therefore we inserted a 32 x 4 cm proximal cuff up to the renal arteries which gave another 7-10 mm of coverage.  Additional angiogram revealed no further leaks after dilating with the balloon.  Having completed the procedure, a completion angiogram was also performed which revealed no other Endo leaks and all leaks resolved prior to leaving the operating room.  Sheaths were then removed over the guidewires and the arteries closed using the ProGlide sutures.  It did require one-third suture placed  on the right side but two sutures on the left were adequate.  Following protamine administration, the sutures were cut, adequate hemostasis achieved, and the wounds closed in layers with Vicryl in a subcuticular fashion with Dermabond.  The patient had palpable posterior tibial pulses and was taken to the recovery room in stable condition.     Quita Skye Hart Rochester, M.D.     JDL/MEDQ  D:  05/20/2011  T:  05/20/2011  Job:  [id number]  Electronically Signed by Josephina Gip M.D. on 05/25/2011 12:49:32 PM

## 2011-06-22 ENCOUNTER — Encounter: Payer: Self-pay | Admitting: Vascular Surgery

## 2011-06-23 ENCOUNTER — Encounter: Payer: Self-pay | Admitting: Vascular Surgery

## 2011-06-23 ENCOUNTER — Ambulatory Visit (INDEPENDENT_AMBULATORY_CARE_PROVIDER_SITE_OTHER): Payer: 59 | Admitting: Vascular Surgery

## 2011-06-23 ENCOUNTER — Ambulatory Visit
Admission: RE | Admit: 2011-06-23 | Discharge: 2011-06-23 | Disposition: A | Discharge status: Home / Self Care | Payer: 59 | Source: Ambulatory Visit | Attending: Physician Assistant | Admitting: Physician Assistant

## 2011-06-23 VITALS — BP 123/68 | HR 78 | Temp 98.3°F | Ht 71.0 in | Wt 296.0 lb

## 2011-06-23 DIAGNOSIS — I714 Abdominal aortic aneurysm, without rupture: Secondary | ICD-10-CM

## 2011-06-23 MED ORDER — IOHEXOL 350 MG/ML SOLN
100.0000 mL | Freq: Once | INTRAVENOUS | Status: AC | PRN
Start: 2011-06-23 — End: 2011-06-23
  Administered 2011-06-23: 100 mL via INTRAVENOUS

## 2011-06-23 NOTE — Progress Notes (Signed)
Subjective:     Patient ID: William Hebert, male   DOB: 21-Dec-1945, 65 y.o.   MRN: [id number]  HPI this 65 year old male patient returns today one month post aortic stent grafting for a large infrarenal abdominal aortic aneurysm . He had an aorto biiliac graft inserted as well as a proximal cuff up to the renal arteries. This was done via percutaneous approach. Done well since his surgery. He is beginning to exercise more with daily walking and has lost 22 pounds.   Past Medical History  Diagnosis Date  . HTN (hypertension)   . GERD (gastroesophageal reflux disease)   . Testosterone deficiency   . Hyperlipidemia   . Morbidly obese   . Headache   . Double vision   . Shortness of breath   . Arthritis     History  Substance Use Topics  . Smoking status: Former Smoker -- 2.0 packs/day for 40 years    Types: Cigarettes    Quit date: 08/31/2005  . Smokeless tobacco: Not on file  . Alcohol Use: No    Family History  Problem Relation Age of Onset  . Cancer Mother   . Heart disease Maternal Aunt     No Known Allergies  Current outpatient prescriptions:aspirin 81 MG tablet, Take 81 mg by mouth daily.  , Disp: , Rfl: ;  BYSTOLIC 10 MG tablet, Take 1 tablet by mouth 2 (two) times daily. , Disp: , Rfl: ;  meloxicam (MOBIC) 15 MG tablet, Take 15 mg by mouth daily.  , Disp: , Rfl: ;  Misc Natural Products (OSTEO BI-FLEX ADV DOUBLE ST PO), Take by mouth.  , Disp: , Rfl: ;  NIFEDICAL XL 60 MG 24 hr tablet, Take 1 tablet by mouth Daily., Disp: , Rfl:  omeprazole (PRILOSEC) 20 MG capsule, Take 1 tablet by mouth Twice daily., Disp: , Rfl: ;  pravastatin (PRAVACHOL) 40 MG tablet, Take 1 tablet by mouth Twice daily., Disp: , Rfl: ;  spironolactone (ALDACTONE) 25 MG tablet, Take 25 mg by mouth daily.  , Disp: , Rfl: ;  TESTIM 50 MG/5GM GEL, 1 Tube Daily., Disp: , Rfl: ;  triamterene-hydrochlorothiazide (DYAZIDE) 37.5-25 MG per capsule, Take 1 capsule by mouth every morning.  , Disp: , Rfl:  diclofenac  (VOLTAREN) 75 MG EC tablet, Take 1 tablet by mouth Twice daily., Disp: , Rfl: ;  fish oil-omega-3 fatty acids 1000 MG capsule, Take 1,200 mg by mouth daily.  , Disp: , Rfl: ;  potassium chloride (K-DUR,KLOR-CON) 10 MEQ tablet, Take 1 tablet by mouth Daily., Disp: , Rfl:  No current facility-administered medications for this visit. Facility-Administered Medications Ordered in Other Visits: iohexol (OMNIPAQUE) 350 MG/ML injection 100 mL, 100 mL, Intravenous, Once PRN, Medication Radiologist, 100 mL at 06/23/11 1015  BP 123/68  Pulse 78  Temp(Src) 98.3 F (36.8 C) (Oral)  Ht 5\' 11"  (1.803 m)  Wt 296 lb (134.265 kg)  BMI 41.28 kg/m2  Body mass index is 41.28 kg/(m^2).        Review of Systems    Objective:   Physical Exam blood pressure 123/68 heart rate 78 respirations 16 Gen. he is an obese middle-aged male no apparent distress alert and oriented x3 Abdomen obese no palpable pulse no masses noted Femoral pulses are 3+ with well-healed puncture sites  I ordered a CT angiogram which I reviewed by computer. The stent graft is in excellent position. There are no type I leaks. He does have a type II leak which seems to  be coming from the inferior mesenteric artery and lumbar arteries. Diameter of the aneurysm is unchanged at 7.4 centimeter    Assessment:    doing well post aortic stent grafting for large infrarenal aortic aneurysm with type II endoleak    Plan:     #1 resume normal activities #2 return in 6 months with CT angiogram to monitor aneurysm size and type II endoleak

## 2011-08-26 ENCOUNTER — Ambulatory Visit (INDEPENDENT_AMBULATORY_CARE_PROVIDER_SITE_OTHER): Payer: 59

## 2011-08-26 DIAGNOSIS — R05 Cough: Secondary | ICD-10-CM

## 2011-08-26 DIAGNOSIS — R209 Unspecified disturbances of skin sensation: Secondary | ICD-10-CM

## 2011-08-26 DIAGNOSIS — E236 Other disorders of pituitary gland: Secondary | ICD-10-CM

## 2011-08-26 DIAGNOSIS — J449 Chronic obstructive pulmonary disease, unspecified: Secondary | ICD-10-CM

## 2011-09-29 ENCOUNTER — Other Ambulatory Visit: Payer: Self-pay | Admitting: Emergency Medicine

## 2011-10-08 ENCOUNTER — Other Ambulatory Visit: Payer: Self-pay | Admitting: Neurosurgery

## 2011-10-08 DIAGNOSIS — M542 Cervicalgia: Secondary | ICD-10-CM

## 2011-10-12 ENCOUNTER — Ambulatory Visit
Admission: RE | Admit: 2011-10-12 | Discharge: 2011-10-12 | Disposition: A | Discharge status: Home / Self Care | Payer: 59 | Source: Ambulatory Visit | Attending: Neurosurgery | Admitting: Neurosurgery

## 2011-10-12 DIAGNOSIS — M542 Cervicalgia: Secondary | ICD-10-CM

## 2011-10-13 ENCOUNTER — Inpatient Hospital Stay: Admission: RE | Admit: 2011-10-13 | Payer: 59 | Source: Ambulatory Visit

## 2011-10-28 ENCOUNTER — Other Ambulatory Visit: Payer: Self-pay | Admitting: *Deleted

## 2011-10-28 DIAGNOSIS — I714 Abdominal aortic aneurysm, without rupture: Secondary | ICD-10-CM

## 2011-10-28 DIAGNOSIS — Z9889 Other specified postprocedural states: Secondary | ICD-10-CM

## 2011-10-28 DIAGNOSIS — Z8679 Personal history of other diseases of the circulatory system: Secondary | ICD-10-CM

## 2011-11-28 ENCOUNTER — Ambulatory Visit (INDEPENDENT_AMBULATORY_CARE_PROVIDER_SITE_OTHER): Payer: 59 | Admitting: Emergency Medicine

## 2011-11-28 VITALS — BP 124/76 | HR 54 | Temp 97.7°F | Resp 14 | Ht 72.0 in | Wt 306.2 lb

## 2011-11-28 DIAGNOSIS — E669 Obesity, unspecified: Secondary | ICD-10-CM

## 2011-11-28 DIAGNOSIS — R609 Edema, unspecified: Secondary | ICD-10-CM

## 2011-11-28 DIAGNOSIS — E291 Testicular hypofunction: Secondary | ICD-10-CM

## 2011-11-28 DIAGNOSIS — I10 Essential (primary) hypertension: Secondary | ICD-10-CM

## 2011-11-28 LAB — POCT CBC
Granulocyte percent: 69.8 %G (ref 37–80)
HCT, POC: 43.5 % (ref 43.5–53.7)
Hemoglobin: 13.9 g/dL — AB (ref 14.1–18.1)
Lymph, poc: 2 (ref 0.6–3.4)
MCH, POC: 28.7 pg (ref 27–31.2)
MCHC: 32 g/dL (ref 31.8–35.4)
MCV: 89.8 fL (ref 80–97)
MID (cbc): 0.5 (ref 0–0.9)
MPV: 8.6 fL (ref 0–99.8)
POC Granulocyte: 5.7 (ref 2–6.9)
POC LYMPH PERCENT: 24.5 %L (ref 10–50)
POC MID %: 5.7 %M (ref 0–12)
Platelet Count, POC: 224 10*3/uL (ref 142–424)
RBC: 4.84 M/uL (ref 4.69–6.13)
RDW, POC: 13.8 %
WBC: 8.2 10*3/uL (ref 4.6–10.2)

## 2011-11-28 LAB — GLUCOSE, POCT (MANUAL RESULT ENTRY): POC Glucose: 103

## 2011-11-28 NOTE — Progress Notes (Signed)
  Subjective:    Patient ID: William Hebert, male    DOB: 1945-09-23, 66 y.o.   MRN: [id number]  HPI patient enters with multiple complaints. First he is having swelling of his legs. He was taken off her diuretics recently. His blood pressures have been well controlled on the current regimen of blood pressure medications. He had recent surgery involving the ulnar nerve of his right arm and is currently in a splint for this. He also questions about his skin he would like checked. He also needs a recheck on his testosterone level since he is on testosterone replacement   Review of Systems he denies chest pain shortness of breath he has had pain in the left side of his abdomen. Apparently he coughed very hard and felt an immediate pain just below the ribs on the left side.     Objective:   Physical Exam  Constitutional: He appears well-developed.  HENT:  Head: Normocephalic.  Eyes: Pupils are equal, round, and reactive to light.  Neck: No thyromegaly present.  Cardiovascular: Normal rate and regular rhythm.  Exam reveals no gallop and no friction rub.   No murmur heard. Pulmonary/Chest: Breath sounds normal. No respiratory distress. He has no wheezes. He has no rales. He exhibits no tenderness.  Musculoskeletal: He exhibits edema.       The right arm is in a splint . He has tenderness beneath the costal margin on the left believe there is a defect there.  Skin:       Skin exam reveals a basal cell which is present to the right side of the nose. There is also a 3 mm pigmented area on the right side of his flank.          Assessment & Plan:   Patient has significant peripheral edema. We'll check a venous Doppler to rule out clot since he has been very sedentary since his recent surgeries for check of the meds as well as his testosterone levels. Is currently on testosterone replacement. And these levels checked. They're going to make an appointment to see the dermatologist because of the  lesion on the right side of his nose as well as a dark pigmented lesion right flank he

## 2011-11-29 LAB — PSA: PSA: 0.49 ng/mL (ref <=4.00)

## 2011-11-29 LAB — BASIC METABOLIC PANEL
BUN: 19 mg/dL (ref 6–23)
CO2: 27 mEq/L (ref 19–32)
Calcium: 9.6 mg/dL (ref 8.4–10.5)
Chloride: 101 mEq/L (ref 96–112)
Creat: 1.15 mg/dL (ref 0.50–1.35)
Glucose, Bld: 98 mg/dL (ref 70–99)
Potassium: 3.6 mEq/L (ref 3.5–5.3)
Sodium: 139 mEq/L (ref 135–145)

## 2011-11-30 LAB — TESTOSTERONE, FREE, TOTAL, SHBG
Sex Hormone Binding: 27 nmol/L (ref 13–71)
Testosterone, Free: 40.9 pg/mL — ABNORMAL LOW (ref 47.0–244.0)
Testosterone-% Free: 2.1 % (ref 1.6–2.9)
Testosterone: 192.86 ng/dL — ABNORMAL LOW (ref 250–890)

## 2011-12-01 ENCOUNTER — Encounter: Payer: Self-pay | Admitting: Radiology

## 2011-12-03 ENCOUNTER — Other Ambulatory Visit: Payer: Self-pay

## 2011-12-04 ENCOUNTER — Ambulatory Visit
Admission: RE | Admit: 2011-12-04 | Discharge: 2011-12-04 | Disposition: A | Discharge status: Home / Self Care | Payer: 59 | Source: Ambulatory Visit | Attending: Emergency Medicine | Admitting: Emergency Medicine

## 2011-12-04 DIAGNOSIS — R609 Edema, unspecified: Secondary | ICD-10-CM

## 2011-12-05 ENCOUNTER — Telehealth: Payer: Self-pay | Admitting: Radiology

## 2011-12-05 NOTE — Telephone Encounter (Signed)
Notified pt of normal exam.

## 2011-12-05 NOTE — Telephone Encounter (Signed)
Message copied by Levon Hedger A on Sat Dec 05, 2011  4:16 PM ------      Message from: Lesle Chris A      Created: Sat Dec 05, 2011 10:52 AM       Please call no evidence of DVT on exam to

## 2011-12-17 ENCOUNTER — Other Ambulatory Visit: Payer: Self-pay

## 2011-12-17 ENCOUNTER — Telehealth: Payer: Self-pay

## 2011-12-17 NOTE — Telephone Encounter (Signed)
Patient is waiting for a call with the outcome of his test.

## 2011-12-18 NOTE — Telephone Encounter (Signed)
Spoke with patient and they said that his legs are still swelling and would like to know what they should do.  They think that his meds might be causing the swelling?  He seen Dr. Nadara Eaton last week and he said everything on his end was good.

## 2011-12-18 NOTE — Telephone Encounter (Signed)
Pt William Hebert and I gave him message from Dr Cleta Alberts. Pt agrees to check w/Dr Ganji's office

## 2011-12-18 NOTE — Telephone Encounter (Signed)
No answer or voice mail available.

## 2011-12-18 NOTE — Telephone Encounter (Signed)
They need to call Dr. Jerre Simon office and see how he wants to adjust his diuretics.

## 2011-12-23 ENCOUNTER — Other Ambulatory Visit: Payer: Self-pay | Admitting: Emergency Medicine

## 2011-12-23 ENCOUNTER — Other Ambulatory Visit: Payer: Self-pay | Admitting: Vascular Surgery

## 2011-12-23 DIAGNOSIS — L989 Disorder of the skin and subcutaneous tissue, unspecified: Secondary | ICD-10-CM

## 2011-12-24 LAB — BUN: BUN: 18 mg/dL (ref 6–23)

## 2011-12-24 LAB — CREATININE, SERUM: Creat: 1.09 mg/dL (ref 0.50–1.35)

## 2011-12-28 ENCOUNTER — Encounter: Payer: Self-pay | Admitting: Vascular Surgery

## 2011-12-29 ENCOUNTER — Encounter: Payer: Self-pay | Admitting: Vascular Surgery

## 2011-12-29 ENCOUNTER — Ambulatory Visit
Admission: RE | Admit: 2011-12-29 | Discharge: 2011-12-29 | Disposition: A | Discharge status: Home / Self Care | Payer: 59 | Source: Ambulatory Visit | Attending: Vascular Surgery | Admitting: Vascular Surgery

## 2011-12-29 ENCOUNTER — Ambulatory Visit (INDEPENDENT_AMBULATORY_CARE_PROVIDER_SITE_OTHER): Payer: 59 | Admitting: Vascular Surgery

## 2011-12-29 VITALS — BP 156/79 | HR 56 | Temp 98.2°F | Ht 70.0 in | Wt 308.0 lb

## 2011-12-29 DIAGNOSIS — Z8679 Personal history of other diseases of the circulatory system: Secondary | ICD-10-CM

## 2011-12-29 DIAGNOSIS — Z48812 Encounter for surgical aftercare following surgery on the circulatory system: Secondary | ICD-10-CM

## 2011-12-29 DIAGNOSIS — I714 Abdominal aortic aneurysm, without rupture, unspecified: Secondary | ICD-10-CM

## 2011-12-29 MED ORDER — IOHEXOL 350 MG/ML SOLN
100.0000 mL | Freq: Once | INTRAVENOUS | Status: AC | PRN
  Administered 2011-12-29: 100 mL via INTRAVENOUS

## 2011-12-29 NOTE — Progress Notes (Signed)
Subjective:     Patient ID: CEM KOSMAN, male   DOB: 1945-10-15, 66 y.o.   MRN: [id number]  HPI this 66 year old male returns for continued followup regarding his aortic stent graft was placed in August of 2012 are 7.4 cm abdominal aortic aneurysm. He has no symptoms of abdominal or back pain at this time. I saw him last in October of 2012 and a CT angiogram at that time revealed a type II endoleak from the I MA or lumbar branches. He has been doing well since we last saw him  Past Medical History  Diagnosis Date  . HTN (hypertension)   . GERD (gastroesophageal reflux disease)   . Testosterone deficiency   . Hyperlipidemia   . Morbidly obese   . Headache   . Double vision   . Shortness of breath   . Arthritis     History  Substance Use Topics  . Smoking status: Former Smoker -- 2.0 packs/day for 40 years    Types: Cigarettes    Quit date: 08/31/2005  . Smokeless tobacco: Not on file  . Alcohol Use: No    Family History  Problem Relation Age of Onset  . Cancer Mother   . Heart disease Maternal Aunt     No Known Allergies  Current outpatient prescriptions:Ascorbic Acid (VITAMIN C) 1000 MG tablet, Take 1,000 mg by mouth daily., Disp: , Rfl: ;  B Complex Vitamins (VITAMIN B COMPLEX PO), Take by mouth daily., Disp: , Rfl: ;  b complex vitamins tablet, Take 1 tablet by mouth daily., Disp: , Rfl: ;  BYSTOLIC 10 MG tablet, Take 1 tablet by mouth 2 (two) times daily. 20mg  bid, Disp: , Rfl: ;  BYSTOLIC 20 MG TABS, Take 20 mg by mouth 2 (two) times daily., Disp: , Rfl:  diclofenac (VOLTAREN) 75 MG EC tablet, Take 1 tablet by mouth Twice daily., Disp: , Rfl: ;  famotidine (PEPCID) 20 MG tablet, Take 20 mg by mouth daily. , Disp: , Rfl: ;  fish oil-omega-3 fatty acids 1000 MG capsule, Take 1,200 mg by mouth daily.  , Disp: , Rfl: ;  Misc Natural Products (OSTEO BI-FLEX ADV DOUBLE ST PO), Take by mouth.  , Disp: , Rfl: ;  NIFEDICAL XL 60 MG 24 hr tablet, Take 1 tablet by mouth Daily., Disp: ,  Rfl:  omeprazole (PRILOSEC) 20 MG capsule, Take 1 tablet by mouth Twice daily., Disp: , Rfl: ;  pravastatin (PRAVACHOL) 40 MG tablet, Take 1 tablet by mouth Twice daily., Disp: , Rfl: ;  pyridOXINE (VITAMIN B-6) 100 MG tablet, Take 200 mg by mouth daily., Disp: , Rfl: ;  TESTIM 50 MG/5GM GEL, 1 Tube 2 (two) times daily. , Disp: , Rfl: ;  traMADol (ULTRAM) 50 MG tablet, Take 50 mg by mouth 3 (three) times daily. , Disp: , Rfl:  aspirin 81 MG tablet, Take 81 mg by mouth daily.  , Disp: , Rfl: ;  LEVITRA 20 MG tablet, TAKE 1/2 TO 1 TABLET BY MOUTH AS NEEDED, Disp: 5 tablet, Rfl: 0;  meloxicam (MOBIC) 15 MG tablet, Take 15 mg by mouth daily.  , Disp: , Rfl: ;  potassium chloride (K-DUR,KLOR-CON) 10 MEQ tablet, Take 1 tablet by mouth Daily., Disp: , Rfl: ;  spironolactone (ALDACTONE) 25 MG tablet, Take 25 mg by mouth daily.  , Disp: , Rfl:  triamterene-hydrochlorothiazide (DYAZIDE) 37.5-25 MG per capsule, Take 1 capsule by mouth every morning.  , Disp: , Rfl:  No current facility-administered medications for this  visit. Facility-Administered Medications Ordered in Other Visits: iohexol (OMNIPAQUE) 350 MG/ML injection 100 mL, 100 mL, Intravenous, Once PRN, Medication Radiologist, MD, 100 mL at 12/29/11 1002  BP 156/79  Pulse 56  Temp(Src) 98.2 F (36.8 C) (Oral)  Ht 5\' 10"  (1.778 m)  Wt 308 lb (139.708 kg)  BMI 44.19 kg/m2  Body mass index is 44.19 kg/(m^2).          Review of Systems denies chest pain but does have mild dyspnea on exertion. No hemoptysis or claudication symptoms    Objective:   Physical Exam blood pressure 156/79 heart rate 56 respirations 18 Gen.-alert and oriented x3 in no apparent distress HEENT normal for age Lungs no rhonchi or wheezing Cardiovascular regular rhythm no murmurs carotid pulses 3+ palpable no bruits audible Abdomen soft nontender no palpable masses-obese Musculoskeletal free of  major deformities Skin clear -no rashes Neurologic normal Lower  extremities 3+ femoral and dorsalis pedis pulses palpable bilaterally with no edema   Today I ordered a CT angiogram which are reviewed by computer. He continues to have a type II endoleak arising from the distal lumbar arteries. Aneurysm sac is slightly smaller today at 7 cm in maximum diameter.      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 He will return in 6 months for repeat CT angiogram to check for enlargement of aneurysm sac.

## 2011-12-29 NOTE — Progress Notes (Signed)
Addended by: Sharee Pimple on: 12/29/2011 12:13 PM   Modules accepted: Orders

## 2012-01-24 ENCOUNTER — Other Ambulatory Visit: Payer: Self-pay | Admitting: Emergency Medicine

## 2012-01-26 ENCOUNTER — Other Ambulatory Visit: Payer: Self-pay | Admitting: Physician Assistant

## 2012-01-26 MED ORDER — PRAVASTATIN SODIUM 40 MG PO TABS: 80.0000 mg | ORAL_TABLET | Freq: Every day | ORAL | Status: DC

## 2012-02-13 ENCOUNTER — Other Ambulatory Visit: Payer: Self-pay | Admitting: Family Medicine

## 2012-03-20 ENCOUNTER — Other Ambulatory Visit: Payer: Self-pay | Admitting: Family Medicine

## 2012-03-26 ENCOUNTER — Telehealth: Payer: Self-pay

## 2012-03-26 MED ORDER — TESTOSTERONE 50 MG/5GM (1%) TD GEL: TRANSDERMAL | Status: DC

## 2012-03-26 NOTE — Telephone Encounter (Signed)
LMOM THAT RX WAS SENT TO PHARMACY  

## 2012-03-26 NOTE — Telephone Encounter (Signed)
Rx sent to pharmacy   

## 2012-03-26 NOTE — Telephone Encounter (Signed)
Pt's wife is calling stating that she has been trying for over three weeks now to get a refill on patients testim  And that we should be calling this in to [facility] [place]  Best number [phone]  dfb

## 2012-04-22 ENCOUNTER — Encounter: Payer: Self-pay | Admitting: Emergency Medicine

## 2012-06-01 ENCOUNTER — Other Ambulatory Visit: Payer: Self-pay | Admitting: Radiology

## 2012-06-04 ENCOUNTER — Other Ambulatory Visit: Payer: Self-pay

## 2012-06-04 MED ORDER — OMEPRAZOLE 20 MG PO CPDR: 20.0000 mg | DELAYED_RELEASE_CAPSULE | Freq: Two times a day (BID) | ORAL | Status: DC

## 2012-06-04 MED ORDER — NIFEDIPINE ER OSMOTIC RELEASE 60 MG PO TB24: 60.0000 mg | ORAL_TABLET | Freq: Every day | ORAL | Status: DC

## 2012-06-12 ENCOUNTER — Other Ambulatory Visit: Payer: Self-pay | Admitting: Emergency Medicine

## 2012-06-14 ENCOUNTER — Other Ambulatory Visit: Payer: Self-pay | Admitting: *Deleted

## 2012-06-14 ENCOUNTER — Other Ambulatory Visit: Payer: Self-pay | Admitting: Radiology

## 2012-06-16 ENCOUNTER — Ambulatory Visit (INDEPENDENT_AMBULATORY_CARE_PROVIDER_SITE_OTHER): Payer: 59 | Admitting: Emergency Medicine

## 2012-06-16 VITALS — BP 117/72 | HR 76 | Temp 97.6°F | Resp 18 | Ht 66.0 in | Wt 235.6 lb

## 2012-06-16 DIAGNOSIS — E1169 Type 2 diabetes mellitus with other specified complication: Secondary | ICD-10-CM | POA: Insufficient documentation

## 2012-06-16 DIAGNOSIS — N529 Male erectile dysfunction, unspecified: Secondary | ICD-10-CM

## 2012-06-16 DIAGNOSIS — E785 Hyperlipidemia, unspecified: Secondary | ICD-10-CM | POA: Insufficient documentation

## 2012-06-16 DIAGNOSIS — E782 Mixed hyperlipidemia: Secondary | ICD-10-CM

## 2012-06-16 DIAGNOSIS — I10 Essential (primary) hypertension: Secondary | ICD-10-CM

## 2012-06-16 LAB — TESTOSTERONE: Testosterone: 145.48 ng/dL — ABNORMAL LOW (ref 300–890)

## 2012-06-16 NOTE — Progress Notes (Signed)
[facility] [address] [place] [address] [phone]  Date:  06/16/2012   Name:  William Hebert   DOB:  1945-12-24   MRN:  [id number]  PCP:  Ruthe Mannan, MD    Chief Complaint: Hypogonadism   History of Present Illness:  William Hebert is a 66 y.o. very pleasant male patient who presents with the following:  History of erectile dysfunction and low testosterone.  On testim since April and requests lab to check suitable dose.  In fact requested multiple labs but not fasting.  Tolerating other meds. Wife complains that patient is unable to achieve an erection suitable for penetration since having surgery on his heel.  No improvement with Cialis 5 mg for daily use and unable to tolerate full dose viagra due to headache.    Patient Active Problem List  Diagnosis  . Dyspnea  . Obstructive sleep apnea  . Leaking abdominal aortic aneurysm  . Mixed dyslipidemia  . Hypertension  . Erectile dysfunction    Past Medical History  Diagnosis Date  . HTN (hypertension)   . GERD (gastroesophageal reflux disease)   . Testosterone deficiency   . Hyperlipidemia   . Morbidly obese   . Headache   . Double vision   . Shortness of breath   . Arthritis     Past Surgical History  Procedure Date  . Knee surgery x 2  . Umbilical hernia repair   . Abdominal aortic aneurysm repair 05/20/11    aorto bifem. stent graft    History  Substance Use Topics  . Smoking status: Former Smoker -- 2.0 packs/day for 40 years    Types: Cigarettes    Quit date: 08/31/2005  . Smokeless tobacco: Not on file  . Alcohol Use: No    Family History  Problem Relation Age of Onset  . Cancer Mother   . Heart disease Maternal Aunt     No Known Allergies  Medication list has been reviewed and updated.  Current Outpatient Prescriptions on File Prior to Visit  Medication Sig Dispense Refill  . Ascorbic Acid (VITAMIN C) 1000 MG tablet Take 1,000 mg by mouth daily.      [name] aspirin  81 MG tablet Take 81 mg by mouth daily.        . B Complex Vitamins (VITAMIN B COMPLEX PO) Take by mouth daily.      [name] b complex vitamins tablet Take 1 tablet by mouth daily.      [name] BYSTOLIC 20 MG TABS Take 20 mg by mouth 2 (two) times daily.      . diclofenac (VOLTAREN) 75 MG EC tablet Take 1 tablet by mouth Twice daily.      . famotidine (PEPCID) 20 MG tablet Take 20 mg by mouth daily.       . fish oil-omega-3 fatty acids 1000 MG capsule Take 1,200 mg by mouth daily.        [name] LEVITRA 20 MG tablet TAKE 1/2 TO 1 TABLET BY MOUTH AS NEEDED  5 tablet  0  . meloxicam (MOBIC) 15 MG tablet Take 15 mg by mouth daily.        . Misc Natural Products (OSTEO BI-FLEX ADV DOUBLE ST PO) Take by mouth.        [name] NIFEdipine (NIFEDICAL XL) 60 MG 24 hr tablet Take 1 tablet (60 mg total) by mouth daily.  90 tablet  0  . omeprazole (PRILOSEC) 20 MG capsule Take 1 capsule (  20 mg total) by mouth 2 (two) times daily.  180 capsule  0  . potassium chloride (K-DUR,KLOR-CON) 10 MEQ tablet Take 1 tablet by mouth Daily.      . pravastatin (PRAVACHOL) 40 MG tablet Take 2 tablets (80 mg total) by mouth daily.  180 tablet  1  . pyridOXINE (VITAMIN B-6) 100 MG tablet Take 200 mg by mouth daily.      [name] spironolactone (ALDACTONE) 25 MG tablet Take 25 mg by mouth daily.        . TESTIM 50 MG/5GM GEL APPLY 2 TUBES DAILY AS DIRECTED  300 g  0  . traMADol (ULTRAM) 50 MG tablet Take 50 mg by mouth 3 (three) times daily.       [name] triamterene-hydrochlorothiazide (DYAZIDE) 37.5-25 MG per capsule Take 1 capsule by mouth every morning.        [name] BYSTOLIC 10 MG tablet Take 1 tablet by mouth 2 (two) times daily. 20mg  bid        Review of Systems:  As per HPI, otherwise negative.    Physical Examination: Filed Vitals:   06/16/12 1627  BP: 117/72  Pulse: 76  Temp: 97.6 F (36.4 C)  Resp: 18   Filed Vitals:   06/16/12 1627  Height: 5\' 6"  (1.676 m)  Weight: 235 lb 9.6 oz (106.867 kg)   Body mass index is 38.03 kg/(m^2). Ideal  Body Weight: Weight in (lb) to have BMI = 25: 154.6     GEN: morbidly obese, NAD, Non-toxic, Alert & Oriented x 3 HEENT: Atraumatic, Normocephalic.  Ears and Nose: No external deformity. EXTR: No clubbing/cyanosis/edema NEURO: Normal gait.  PSYCH: Normally interactive. Conversant. Not depressed or anxious appearing.  Calm demeanor.    Assessment and Plan: Erectile dysfunction hypogonadism Labs Suggested trying to optimize testosterone around 600  Alternatives to ED drugs include vacuum pumps and bands, and surgical implants  Carmelina Dane, MD  I have reviewed and agree with documentation. Robert P. Merla Riches, M.D.

## 2012-06-21 ENCOUNTER — Other Ambulatory Visit: Payer: Self-pay | Admitting: Vascular Surgery

## 2012-06-21 ENCOUNTER — Telehealth: Payer: Self-pay

## 2012-06-21 LAB — BUN: BUN: 14 mg/dL (ref 6–23)

## 2012-06-21 LAB — CREATININE, SERUM: Creat: 1.19 mg/dL (ref 0.50–1.35)

## 2012-06-21 NOTE — Telephone Encounter (Signed)
Pt's wife asked about new Rx for pt's Testim. Pt would like for Dr Dareen Piano to go ahead and send in the increased dosage of Testim for pt to [facility] in [place] - see notes under lab results. Dr Dareen Piano, can you please send in new Rx and route back to clinical message pool so that we can call pt to notify?

## 2012-06-25 MED ORDER — TESTOSTERONE 50 MG/5GM (1%) TD GEL: TRANSDERMAL | Status: DC

## 2012-06-25 NOTE — Telephone Encounter (Signed)
Increase dose to four tubes daily

## 2012-06-26 NOTE — Telephone Encounter (Signed)
LMOM with message that Rx was sent in.

## 2012-06-27 ENCOUNTER — Encounter: Payer: Self-pay | Admitting: Vascular Surgery

## 2012-06-28 ENCOUNTER — Ambulatory Visit (INDEPENDENT_AMBULATORY_CARE_PROVIDER_SITE_OTHER): Payer: 59 | Admitting: Vascular Surgery

## 2012-06-28 ENCOUNTER — Encounter: Payer: Self-pay | Admitting: Vascular Surgery

## 2012-06-28 ENCOUNTER — Ambulatory Visit
Admission: RE | Admit: 2012-06-28 | Discharge: 2012-06-28 | Disposition: A | Discharge status: Home / Self Care | Payer: 59 | Source: Ambulatory Visit | Attending: Vascular Surgery | Admitting: Vascular Surgery

## 2012-06-28 VITALS — BP 152/80 | HR 66 | Resp 20 | Ht 71.0 in | Wt 310.0 lb

## 2012-06-28 DIAGNOSIS — Z8679 Personal history of other diseases of the circulatory system: Secondary | ICD-10-CM

## 2012-06-28 DIAGNOSIS — Z9889 Other specified postprocedural states: Secondary | ICD-10-CM

## 2012-06-28 DIAGNOSIS — Z48812 Encounter for surgical aftercare following surgery on the circulatory system: Secondary | ICD-10-CM

## 2012-06-28 DIAGNOSIS — I714 Abdominal aortic aneurysm, without rupture: Secondary | ICD-10-CM

## 2012-06-28 MED ORDER — IOHEXOL 350 MG/ML SOLN
100.0000 mL | Freq: Once | INTRAVENOUS | Status: AC | PRN
  Administered 2012-06-28: 100 mL via INTRAVENOUS

## 2012-06-28 NOTE — Progress Notes (Signed)
Subjective:     Patient ID: William Hebert, male   DOB: 05/02/1946, 66 y.o.   MRN: [id number]  HPI this 66 year old male returns 14 months post abdominal aortic stent grafting for a 7.4 cm abdominal aortic aneurysm. He denies any complaints related to that today. He has no abdominal or back symptoms. His kidney function has been good. He did have some right lower extremity surgery on the foot by Dr. Earlie Counts few months ago.  Past Medical History  Diagnosis Date  . HTN (hypertension)   . GERD (gastroesophageal reflux disease)   . Testosterone deficiency   . Hyperlipidemia   . Morbidly obese   . Headache   . Double vision   . Shortness of breath   . Arthritis     History  Substance Use Topics  . Smoking status: Former Smoker -- 2.0 packs/day for 40 years    Types: Cigarettes    Quit date: 08/31/2005  . Smokeless tobacco: Not on file  . Alcohol Use: No    Family History  Problem Relation Age of Onset  . Cancer Mother   . Heart disease Maternal Aunt     No Known Allergies  Current outpatient prescriptions:Ascorbic Acid (VITAMIN C) 1000 MG tablet, Take 1,000 mg by mouth daily., Disp: , Rfl: ;  aspirin 81 MG tablet, Take 81 mg by mouth daily.  , Disp: , Rfl: ;  B Complex Vitamins (VITAMIN B COMPLEX PO), Take by mouth daily., Disp: , Rfl: ;  b complex vitamins tablet, Take 1 tablet by mouth daily., Disp: , Rfl: ;  BYSTOLIC 10 MG tablet, Take 1 tablet by mouth 2 (two) times daily. 20mg  bid, Disp: , Rfl:  BYSTOLIC 20 MG TABS, Take 20 mg by mouth 2 (two) times daily., Disp: , Rfl: ;  diclofenac (VOLTAREN) 75 MG EC tablet, Take 1 tablet by mouth Twice daily., Disp: , Rfl: ;  famotidine (PEPCID) 20 MG tablet, Take 20 mg by mouth daily. , Disp: , Rfl: ;  fish oil-omega-3 fatty acids 1000 MG capsule, Take 1,200 mg by mouth daily.  , Disp: , Rfl: ;  meloxicam (MOBIC) 15 MG tablet, Take 15 mg by mouth daily.  , Disp: , Rfl:  Misc Natural Products (OSTEO BI-FLEX ADV DOUBLE ST PO), Take by mouth.  ,  Disp: , Rfl: ;  NIFEdipine (NIFEDICAL XL) 60 MG 24 hr tablet, Take 1 tablet (60 mg total) by mouth daily., Disp: 90 tablet, Rfl: 0;  omeprazole (PRILOSEC) 20 MG capsule, Take 1 capsule (20 mg total) by mouth 2 (two) times daily., Disp: 180 capsule, Rfl: 0;  potassium chloride (K-DUR,KLOR-CON) 10 MEQ tablet, Take 1 tablet by mouth Daily., Disp: , Rfl:  pravastatin (PRAVACHOL) 40 MG tablet, Take 2 tablets (80 mg total) by mouth daily., Disp: 180 tablet, Rfl: 1;  pyridOXINE (VITAMIN B-6) 100 MG tablet, Take 200 mg by mouth daily., Disp: , Rfl: ;  spironolactone (ALDACTONE) 25 MG tablet, Take 25 mg by mouth daily.  , Disp: , Rfl: ;  testosterone (TESTIM) 50 MG/5GM GEL, 4 tubes daily, Disp: 600 g, Rfl: 2;  traMADol (ULTRAM) 50 MG tablet, Take 50 mg by mouth 3 (three) times daily. , Disp: , Rfl:  triamterene-hydrochlorothiazide (DYAZIDE) 37.5-25 MG per capsule, Take 1 capsule by mouth every morning.  , Disp: , Rfl: ;  LEVITRA 20 MG tablet, TAKE 1/2 TO 1 TABLET BY MOUTH AS NEEDED, Disp: 5 tablet, Rfl: 0 No current facility-administered medications for this visit. Facility-Administered Medications Ordered in  Other Visits: iohexol (OMNIPAQUE) 350 MG/ML injection 100 mL, 100 mL, Intravenous, Once PRN, Medication Radiologist, MD, 100 mL at 06/28/12 0944  BP 152/80  Pulse 66  Resp 20  Ht 5\' 11"  (1.803 m)  Wt 310 lb (140.615 kg)  BMI 43.24 kg/m2  Body mass index is 43.24 kg/(m^2).        Review of Systems denies chest pain, dyspnea on exertion, PND, orthopnea, claudication to     Objective:   Physical Exam blood pressure 150/80 heart rate 66 respirations 20 Gen.-alert and oriented x3 in no apparent distress HEENT normal for age Lungs no rhonchi or wheezing Cardiovascular regular rhythm no murmurs carotid pulses 3+ palpable no bruits audible Abdomen soft nontender no palpable masses-obese Musculoskeletal free of  major deformities Skin clear -no rashes Neurologic normal Lower extremities 3+  femoral and dorsalis pedis right leg-left leg in splint below-knee  Today I ordered a CT angiogram which are reviewed by computer. The aneurysm measures approximately 7 cm in maximum diameter. The type II endoleak continues to be present but seems to have diminished slightly. Graft is in Position.     Assessment:     14 months post aortic stent graft for abdominal aortic aneurysm with small type II endoleak and stable sac diameter    Plan:      return in one year for repeat CT angiogram

## 2012-06-29 NOTE — Progress Notes (Signed)
Reviewed and agree.

## 2012-07-03 ENCOUNTER — Other Ambulatory Visit: Payer: Self-pay | Admitting: Family Medicine

## 2012-07-04 NOTE — Telephone Encounter (Signed)
Pt requesting refill on Viagra but I don't see this on his med list.  Looks like he is on Levitra, is this what he needs refilled?

## 2012-07-09 ENCOUNTER — Telehealth: Payer: Self-pay

## 2012-07-09 NOTE — Telephone Encounter (Signed)
PATIENT'S WIFE CALLED TO SAY SHE HAS CONTACTED THE PHARMACY TWICE REGARDING HER HUSBAND GETTING A REFILL ON HIS VIAGRA. BOTH TIMES THE PHARMACY TOLD HER THEY HAVE NOT HEARD BACK FROM [place] AND THAT SHE SHE CALL us. SHE WOULD LIKE TO GET THIS REFILL ON HER HUSBAND'S MEDICATION AS SOON AS POSSIBLE PLEASE. BEST PHONE [phone] (DARLENE Heese'S CELL)  OR  [phone] (Breion Murph'S CELL)     PHARMACY CHOICE IS [facility] [place], [place].   MBC

## 2012-07-11 ENCOUNTER — Other Ambulatory Visit: Payer: Self-pay | Admitting: Physician Assistant

## 2012-07-11 MED ORDER — SILDENAFIL CITRATE 100 MG PO TABS: 50.0000 mg | ORAL_TABLET | Freq: Every day | ORAL | Status: DC | PRN

## 2012-07-11 NOTE — Telephone Encounter (Signed)
Patient to have a refill on his Viagra with 4 refills

## 2012-07-11 NOTE — Telephone Encounter (Signed)
Patient requests Rx for Viagra, please advise if Viagra should be renewed.

## 2012-07-11 NOTE — Telephone Encounter (Signed)
Called patient to advise  °

## 2012-08-11 ENCOUNTER — Other Ambulatory Visit: Payer: Self-pay | Admitting: Physician Assistant

## 2012-08-12 NOTE — Telephone Encounter (Signed)
Needs office visit.

## 2012-08-12 NOTE — Telephone Encounter (Signed)
[id number] forwarded to [facility] Pool  bf

## 2012-08-18 ENCOUNTER — Other Ambulatory Visit: Payer: Self-pay | Admitting: *Deleted

## 2012-08-18 DIAGNOSIS — I714 Abdominal aortic aneurysm, without rupture: Secondary | ICD-10-CM

## 2012-08-18 DIAGNOSIS — Z48812 Encounter for surgical aftercare following surgery on the circulatory system: Secondary | ICD-10-CM

## 2012-08-23 ENCOUNTER — Telehealth: Payer: Self-pay | Admitting: Physician Assistant

## 2012-08-23 MED ORDER — PRAVASTATIN SODIUM 40 MG PO TABS: 80.0000 mg | ORAL_TABLET | Freq: Every day | ORAL | Status: DC

## 2012-08-23 NOTE — Telephone Encounter (Signed)
Pravastatin sent to mail order pharmacy, pt needs OV and labs for more

## 2012-08-31 DIAGNOSIS — Z85828 Personal history of other malignant neoplasm of skin: Secondary | ICD-10-CM

## 2012-08-31 HISTORY — PX: CATARACT EXTRACTION: SUR2

## 2012-08-31 HISTORY — DX: Personal history of other malignant neoplasm of skin: Z85.828

## 2012-09-13 ENCOUNTER — Ambulatory Visit: Payer: 59 | Admitting: Family Medicine

## 2012-09-26 ENCOUNTER — Ambulatory Visit (INDEPENDENT_AMBULATORY_CARE_PROVIDER_SITE_OTHER): Payer: 59 | Admitting: Emergency Medicine

## 2012-09-26 ENCOUNTER — Encounter: Payer: Self-pay | Admitting: Emergency Medicine

## 2012-09-26 VITALS — BP 154/75 | HR 63 | Temp 98.2°F | Resp 17 | Ht 72.0 in | Wt 320.0 lb

## 2012-09-26 DIAGNOSIS — R05 Cough: Secondary | ICD-10-CM

## 2012-09-26 DIAGNOSIS — G4733 Obstructive sleep apnea (adult) (pediatric): Secondary | ICD-10-CM

## 2012-09-26 DIAGNOSIS — R7989 Other specified abnormal findings of blood chemistry: Secondary | ICD-10-CM

## 2012-09-26 DIAGNOSIS — Z0000 Encounter for general adult medical examination without abnormal findings: Secondary | ICD-10-CM

## 2012-09-26 DIAGNOSIS — E669 Obesity, unspecified: Secondary | ICD-10-CM

## 2012-09-26 DIAGNOSIS — R059 Cough, unspecified: Secondary | ICD-10-CM

## 2012-09-26 DIAGNOSIS — I10 Essential (primary) hypertension: Secondary | ICD-10-CM

## 2012-09-26 DIAGNOSIS — E291 Testicular hypofunction: Secondary | ICD-10-CM

## 2012-09-26 LAB — CBC
HCT: 44.8 % (ref 39.0–52.0)
Hemoglobin: 15.3 g/dL (ref 13.0–17.0)
MCH: 29.5 pg (ref 26.0–34.0)
MCHC: 34.2 g/dL (ref 30.0–36.0)
MCV: 86.5 fL (ref 78.0–100.0)
Platelets: 203 10*3/uL (ref 150–400)
RBC: 5.18 MIL/uL (ref 4.22–5.81)
RDW: 14.4 % (ref 11.5–15.5)
WBC: 8.5 10*3/uL (ref 4.0–10.5)

## 2012-09-26 LAB — POCT URINALYSIS DIPSTICK
Bilirubin, UA: NEGATIVE
Blood, UA: NEGATIVE
Glucose, UA: NEGATIVE
Ketones, UA: NEGATIVE
Leukocytes, UA: NEGATIVE
Nitrite, UA: NEGATIVE
Protein, UA: 30
Spec Grav, UA: 1.02
Urobilinogen, UA: 0.2
pH, UA: 7

## 2012-09-26 LAB — COMPREHENSIVE METABOLIC PANEL
ALT: 28 U/L (ref 0–53)
AST: 27 U/L (ref 0–37)
Albumin: 4.4 g/dL (ref 3.5–5.2)
Alkaline Phosphatase: 103 U/L (ref 39–117)
BUN: 17 mg/dL (ref 6–23)
CO2: 26 mEq/L (ref 19–32)
Calcium: 9.4 mg/dL (ref 8.4–10.5)
Chloride: 102 mEq/L (ref 96–112)
Creat: 0.95 mg/dL (ref 0.50–1.35)
Glucose, Bld: 86 mg/dL (ref 70–99)
Potassium: 3.4 mEq/L — ABNORMAL LOW (ref 3.5–5.3)
Sodium: 140 mEq/L (ref 135–145)
Total Bilirubin: 0.4 mg/dL (ref 0.3–1.2)
Total Protein: 6.9 g/dL (ref 6.0–8.3)

## 2012-09-26 LAB — TSH: TSH: 1.754 u[IU]/mL (ref 0.350–4.500)

## 2012-09-26 LAB — IFOBT (OCCULT BLOOD): IFOBT: NEGATIVE

## 2012-09-26 NOTE — Progress Notes (Addendum)
@[facility] @  Patient ID: William Hebert MRN: [id number], DOB: [birth year] 67 y.o. Date of Encounter: 09/26/2012, 5:16 PM  Primary Physician: Ruthe Mannan, MD  Chief Complaint: Physical (CPE)  HPI: 67 y.o. y/o male with history noted below here for CPE.  Doing well. No issues/complaints.  Review of Systems:  Consitutional: No fever, chills, fatigue, night sweats, lymphadenopathy, patient has been gaining weight since he has not been able to exercise   Eyes: No visual changes, eye redness, or discharge. Patient is known to have bilateral cataracts. ENT/Mouth: Ears: No otalgia, tinnitus, hearing loss, discharge. Nose: No congestion, rhinorrhea, sinus pain, or epistaxis. Throat: No sore throat, post nasal drip, or teeth pain he has had difficulty with phlegm and fluid collection in the back of his throat.. Cardiovascular: No CP, palpitations, diaphoresis, DOE, edema, orthopnea, PND. Patient has been under the care of Dr.Ganji and had a thorough cardiovascular workup. Respiratory: No cough, hemoptysis, SOB, or wheezing. He is known to have obstructive sleep apnea and uses his CPAP regular. Gastrointestinal: No anorexia, dysphagia, reflux, pain, nausea, vomiting, hematemesis, diarrhea, constipation, BRBPR, or melena he is up-to-date on colon screening.. Genitourinary: No dysuria, frequency, urgency, hematuria, incontinence, nocturia, decreased urinary stream, discharge, impotence, or testicular pain/masses. He has a history of low testosterone decreased libido currently on testosterone replacement. Musculoskeletal: No decreased ROM, myalgias, stiffness, joint swelling, or weakness. He has a lot of discomfort in his back difficulty ambulating which has been worse since he gained weight he Skin: No rash, erythema, lesion changes, pain, warmth, jaundice, or pruritis. Neurological: No headache, dizziness, syncope, seizures, tremors, memory loss, coordination problems, or paresthesias. Psychological: No  anxiety, depression, hallucinations, SI/HI. Endocrine: No fatigue, polydipsia, polyphagia, polyuria, or known diabetes. All other systems were reviewed and are otherwise negative.  Past Medical History  Diagnosis Date  . HTN (hypertension)   . GERD (gastroesophageal reflux disease)   . Testosterone deficiency   . Hyperlipidemia   . Morbidly obese   . Headache   . Double vision   . Shortness of breath   . Arthritis      Past Surgical History  Procedure Date  . Knee surgery x 2  . Umbilical hernia repair   . Abdominal aortic aneurysm repair 05/20/11    aorto bifem. stent graft    Home Meds:  Prior to Admission medications   Medication Sig Start Date End Date Taking? Authorizing Provider  aspirin 81 MG tablet Take 81 mg by mouth daily.     Yes Historical Provider, MD  B Complex Vitamins (VITAMIN B COMPLEX PO) Take by mouth daily.   Yes Historical Provider, MD  b complex vitamins tablet Take 1 tablet by mouth daily.   Yes Historical Provider, MD  BYSTOLIC 20 MG TABS Take 20 mg by mouth 2 (two) times daily. 12/18/11  Yes Historical Provider, MD  diclofenac (VOLTAREN) 75 MG EC tablet Take 1 tablet by mouth Twice daily. 03/18/11  Yes Historical Provider, MD  famotidine (PEPCID) 20 MG tablet Take 20 mg by mouth daily.    Yes Historical Provider, MD  meloxicam (MOBIC) 15 MG tablet Take 15 mg by mouth daily.     Yes Historical Provider, MD  Misc Natural Products (OSTEO BI-FLEX ADV DOUBLE ST PO) Take by mouth.     Yes Historical Provider, MD  NIFEdipine (PROCARDIA XL/ADALAT-CC) 60 MG 24 hr tablet Take 1 tablet by mouth  daily 08/11/12  Yes Ryan M Dunn, PA-C  omeprazole (PRILOSEC) 20 MG capsule Take 1 capsule by mouth  two times daily 08/11/12  Yes Ryan M Dunn, PA-C  potassium chloride (K-DUR,KLOR-CON) 10 MEQ tablet Take 1 tablet by mouth Daily. 03/18/11  Yes Historical Provider, MD  pravastatin (PRAVACHOL) 40 MG tablet Take 2 tablets (80 mg total) by mouth daily. 08/23/12  Yes Eleanore Delia Chimes, PA-C  pyridOXINE (VITAMIN B-6) 100 MG tablet Take 200 mg by mouth daily.   Yes Historical Provider, MD  sildenafil (VIAGRA) 100 MG tablet Take 0.5-1 tablets (50-100 mg total) by mouth daily as needed for erectile dysfunction. 07/11/12  Yes Collene Gobble, MD  testosterone (TESTIM) 50 MG/5GM GEL 4 tubes daily 06/25/12  Yes Phillips Odor, MD  traMADol (ULTRAM) 50 MG tablet Take 50 mg by mouth 3 (three) times daily.    Yes Historical Provider, MD  Ascorbic Acid (VITAMIN C) 1000 MG tablet Take 1,000 mg by mouth daily.    Historical Provider, MD  BYSTOLIC 10 MG tablet Take 1 tablet by mouth 2 (two) times daily. 20mg  bid 03/30/11   Historical Provider, MD  fish oil-omega-3 fatty acids 1000 MG capsule Take 1,200 mg by mouth daily.      Historical Provider, MD  LEVITRA 20 MG tablet TAKE 1/2 TO 1 TABLET BY MOUTH AS NEEDED 09/29/11   Chelle S Jeffery, PA-C  spironolactone (ALDACTONE) 25 MG tablet Take 25 mg by mouth daily.      Historical Provider, MD  triamterene-hydrochlorothiazide (DYAZIDE) 37.5-25 MG per capsule Take 1 capsule by mouth every morning.      Historical Provider, MD    Allergies: No Known Allergies  History   Social History  . Marital Status: Married    Spouse Name: N/A    Number of Children: N/A  . Years of Education: N/A   Occupational History  . 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     Social History Main Topics  . Smoking status: Former Smoker -- 2.0 packs/day for 40 years    Types: Cigarettes    Quit date: 08/31/2005  . Smokeless tobacco: Not on file  . Alcohol Use: No  . Drug Use: No  . Sexually Active: Not on file   Other Topics Concern  . Not on file   Social History Narrative  . No narrative on file    Family History  Problem Relation Age of Onset  . Cancer Mother   . Heart disease Maternal Aunt     Physical Exam:  Blood pressure 154/75, pulse 63, temperature 98.2 F (36.8 C), temperature source Oral, resp. rate 17, height 6' (1.829 m), weight 320 lb (145.151 kg),  SpO2 95.00%. blood pressure was repeated and was 126/82 General: Well developed, well nourished, in no acute distress. HEENT: Normocephalic, atraumatic. Conjunctiva pink, sclera non-icteric. Pupils 2 mm constricting to 1 mm, round, regular, and equally reactive to light and accomodation. EOMI. Internal auditory canal clear. TMs with good cone of light and without pathology. Nasal mucosa pink. Nares are without discharge. No sinus tenderness. Oral mucosa pink. Dentition . Pharynx without exudate.   Neck: Supple. Trachea midline. No thyromegaly. Full ROM. No lymphadenopathy. Lungs: Clear to auscultation bilaterally without wheezes, rales, or rhonchi. Breathing is of normal effort and unlabored. Cardiovascular: RRR with S1 S2. No murmurs, rubs, or gallops appreciated. Distal pulses 2+ symmetrically. No carotid or abdominal bruits. Her multiple scars present which represent the sites of his abdominal aortic aneurysm repair  Abdomen: Soft, non-tender, non-distended with normoactive bowel sounds. No hepatosplenomegaly or masses. No rebound/guarding. No CVA tenderness. Without hernias.  Rectal: No external hemorrhoids or  fissures. Rectal vault without masses.   Genitourinary:   circumcised male. No penile lesions. Testes descended bilaterally, and smooth without tenderness or masses.  Musculoskeletal: Full range of motion and 5/5 strength throughout. Without swelling, atrophy, tenderness, crepitus, or warmth. Extremities without clubbing, cyanosis, or edema. Calves supple. Skin: Warm and moist without erythema, ecchymosis, wounds, or rash. Neuro: A+Ox3. CN II-XII grossly intact. Moves all extremities spontaneously. Full sensation throughout. Normal gait. DTR 2+ throughout upper and lower extremities. Finger to nose intact. Psych:  Responds to questions appropriately with a normal affect.   Results for orders placed in visit on 09/26/12  IFOBT (OCCULT BLOOD)      Component Value Range   IFOBT Negative     POCT URINALYSIS DIPSTICK      Component Value Range   Color, UA yellow     Clarity, UA clear     Glucose, UA neg     Bilirubin, UA neg     Ketones, UA neg     Spec Grav, UA 1.020     Blood, UA neg     pH, UA 7.0     Protein, UA 30     Urobilinogen, UA 0.2     Nitrite, UA neg     Leukocytes, UA Negative     Studies: CBC, CMET, Lipid, PSA, TSH, free and total testosterone      Assessment/Plan:  67 y.o. y/o   male here for CPE Routine labs were done including testosterone level to followup on his testosterone replacement. He's going to get his chip read on his CPAP machine to see if he can verify usage. -  Signed, Earl Lites, MD 09/26/2012 5:16 PM

## 2012-09-27 LAB — TESTOSTERONE, FREE, TOTAL, SHBG
Sex Hormone Binding: 34 nmol/L (ref 13–71)
Testosterone, Free: 89.4 pg/mL (ref 47.0–244.0)
Testosterone-% Free: 2 % (ref 1.6–2.9)
Testosterone: 441.66 ng/dL (ref 300–890)

## 2012-09-27 LAB — PSA: PSA: 0.56 ng/mL (ref <=4.00)

## 2012-10-03 ENCOUNTER — Telehealth: Payer: Self-pay

## 2012-10-03 NOTE — Telephone Encounter (Signed)
Wife did not know what she was to do, advised her to have company send [place] his compliance, she will do this.

## 2012-10-03 NOTE — Telephone Encounter (Signed)
William Hebert STATES SOME PAPERS REGARDING HER HUSBAND C-PAP SHOULD HAVE BEEN SENT TO [place] SO HER HUSBAND CAN GET HIS CDL LICENSE. WOULD LIKE TO KNOW THE STATUS. PLEASE CALL [phone]

## 2012-10-05 ENCOUNTER — Telehealth: Payer: Self-pay

## 2012-10-05 NOTE — Telephone Encounter (Signed)
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 Best# [phone]

## 2012-10-05 NOTE — Telephone Encounter (Signed)
Called patient to advise, we got compliance with CPAP for 1 week. However we need this for 1 month. I have left message for patient to advise.

## 2012-10-06 NOTE — Telephone Encounter (Signed)
Patient needs at least 2 weeks of documentation and then I can sign off on his DOT

## 2012-10-06 NOTE — Telephone Encounter (Signed)
Patient states there was a malfunction with his machine, and they could not print out anything more than the one week patient provided for [place]. Please advise.

## 2012-10-07 NOTE — Telephone Encounter (Signed)
Patient advised.

## 2012-10-10 ENCOUNTER — Ambulatory Visit: Payer: Self-pay | Admitting: Family Medicine

## 2012-10-15 ENCOUNTER — Ambulatory Visit: Payer: 59

## 2012-10-15 ENCOUNTER — Ambulatory Visit (INDEPENDENT_AMBULATORY_CARE_PROVIDER_SITE_OTHER): Payer: 59 | Admitting: Family Medicine

## 2012-10-15 VITALS — BP 134/66 | HR 63 | Temp 97.5°F | Resp 18 | Ht 71.75 in | Wt 325.0 lb

## 2012-10-15 DIAGNOSIS — M542 Cervicalgia: Secondary | ICD-10-CM

## 2012-10-15 DIAGNOSIS — M549 Dorsalgia, unspecified: Secondary | ICD-10-CM

## 2012-10-15 DIAGNOSIS — S40011A Contusion of right shoulder, initial encounter: Secondary | ICD-10-CM

## 2012-10-15 DIAGNOSIS — M62838 Other muscle spasm: Secondary | ICD-10-CM

## 2012-10-15 MED ORDER — CYCLOBENZAPRINE HCL 5 MG PO TABS: ORAL_TABLET | ORAL | Status: DC

## 2012-10-15 NOTE — Patient Instructions (Signed)
Ice or heat to area where you are sore. Range of motion as discussed. Ultram OR flexeril if needed but try to avoid combining these. Return to the clinic or go to the nearest emergency room if any of your symptoms worsen or new symptoms occur.

## 2012-10-15 NOTE — Progress Notes (Signed)
  Subjective:    Patient ID: DACEN FRAYRE, male    DOB: 19-Apr-1946, 67 y.o.   MRN: [id number]  HPI KAMALI NEPHEW is a 67 y.o. male  Larey Seat yesterday am - walking across parking lot, ? Black ice.  Upper back sore and r neck sore.  Trouble turning head to right today.   No initial treatment.  More sore today.  Took tramadol yesterday. No bowel or bladder incontinence, no saddle anesthesia, no lower extremity weakness.  No arm weakness.    Detention office - [facility].  Review of Systems  Constitutional: Negative for fever and chills.  Respiratory: Negative for cough, chest tightness and shortness of breath.        Posterior upper back sore and swollen area.        Objective:   Physical Exam  Vitals reviewed. Constitutional: He is oriented to person, place, and time. He appears well-developed and well-nourished. No distress.  Obese.   HENT:  Head: Normocephalic and atraumatic.  Eyes: Pupils are equal, round, and reactive to light.  Pulmonary/Chest: Effort normal and breath sounds normal. He has no decreased breath sounds (distant breath sounds with body habitus, but equal, and clear. ). He has no wheezes.    Musculoskeletal:       Cervical back: He exhibits decreased range of motion (min decreased R rotation, with discomfort in r rotation. ). He exhibits no bony tenderness and no swelling.       Back:  Neurological: He is alert and oriented to person, place, and time. He has normal strength. No sensory deficit.  Equal strength, from at elbow.   Skin: Skin is warm and dry. He is not diaphoretic. No erythema.  Psychiatric: He has a normal mood and affect. His behavior is normal.    [facility] reading (PRIMARY) by  Dr. Neva Seat: C Spine: djd, no apparent fx R rib series:  No apparent fx. .     Assessment & Plan:  KAELYN NAUTA is a 67 y.o. male  Back pain Contusion, scapular region, right, initial encounter - Plan: cyclobenzaprine (FLEXERIL) 5 MG tablet or ultram as below - has at home.   Rom, sx care with heat or ice and rtc precautions.   Neck pain - Plan: DG Cervical Spine Complete - no fx identified.  Suspect nmuscle strain/whiplash type injury. cyclobenzaprine (FLEXERIL) 5 MG tablet - as above, heat, rom, rtc precautions. Understanding expressed.   Muscle spasm - Plan: cyclobenzaprine (FLEXERIL) 5 MG tablet   Patient Instructions  Ice or heat to area where you are sore. Range of motion as discussed. Ultram OR flexeril if needed but try to avoid combining these. Return to the clinic or go to the nearest emergency room if any of your symptoms worsen or new symptoms occur.

## 2012-10-17 ENCOUNTER — Telehealth: Payer: Self-pay

## 2012-10-17 NOTE — Telephone Encounter (Signed)
Wife called to report that pt. fell on 10/14/12, as he slipped on ice.  Stated "he fell flat on his back onto the pavement, in the parking lot, when he got off of work.  Stated that pt. was evaluated by his medical doctor, and did not have any fractures.  Stated his neck is very sore, and pt.c/o pain in the upper back, between his shoulder blades.  Denies abdominal pain, nausea or vomiting.  States that the PCP wanted the pt. to check with Dr. Hart Rochester re: evaluation of the stent graft repair of his AAA.  Advised wife that Dr. Hart Rochester was on vacation this week.  Discussed w/ Dr. Myra Gianotti.  Advised that the pt. should continue to monitor, and report any worsening symptoms and need for further evaluation.  Wife was advised to monitor for change in back pain, occurrence of abdominal pain, nausea/vomiting.  Wife stated she understood, and will cont. to monitor pt's. symptoms.  Verb. Will call office if worsening symptoms.

## 2012-10-26 ENCOUNTER — Other Ambulatory Visit: Payer: Self-pay | Admitting: Physician Assistant

## 2012-10-30 ENCOUNTER — Other Ambulatory Visit: Payer: Self-pay | Admitting: Emergency Medicine

## 2012-10-30 DIAGNOSIS — R609 Edema, unspecified: Secondary | ICD-10-CM

## 2012-11-03 ENCOUNTER — Other Ambulatory Visit: Payer: Self-pay | Admitting: Radiology

## 2012-11-03 ENCOUNTER — Ambulatory Visit
Admission: RE | Admit: 2012-11-03 | Discharge: 2012-11-03 | Disposition: A | Discharge status: Home / Self Care | Payer: 59 | Source: Ambulatory Visit | Attending: Emergency Medicine | Admitting: Emergency Medicine

## 2012-11-03 DIAGNOSIS — R609 Edema, unspecified: Secondary | ICD-10-CM

## 2012-11-03 DIAGNOSIS — M79661 Pain in right lower leg: Secondary | ICD-10-CM

## 2012-11-09 ENCOUNTER — Telehealth: Payer: Self-pay | Admitting: Radiology

## 2012-11-09 NOTE — Telephone Encounter (Signed)
Patient went for the doppler scan, then refused to have it done. To you FYI

## 2012-11-10 ENCOUNTER — Ambulatory Visit (HOSPITAL_COMMUNITY): Payer: 59

## 2012-11-16 ENCOUNTER — Telehealth: Payer: Self-pay

## 2012-11-16 DIAGNOSIS — I872 Venous insufficiency (chronic) (peripheral): Secondary | ICD-10-CM

## 2012-11-16 MED ORDER — JOBST ANTI-EM KNEE HIGH MED MISC: 30.0000 mmol | Status: DC

## 2012-11-16 NOTE — Telephone Encounter (Signed)
We will do both, referral made to vein surgeon. Rx to patient for support hose with zipper, hopefully this will be easier for him to use. Have mailed the hose Rx to his home, he can get these at any medical supply store. Will call to advise.

## 2012-11-16 NOTE — Telephone Encounter (Signed)
pts wife advised

## 2012-11-16 NOTE — Telephone Encounter (Signed)
Wife relates leg still swollen can not get his support hose, on. I advised her he may benefit from jobst support hose with a zipper. Please advise these may be easier for patient to use.

## 2012-11-16 NOTE — Telephone Encounter (Signed)
We do need to get William Hebert fitted for a Jobst zipper stocking or I can get him an appointment to be seen by one of the vein and vascular specialists  to see if they have other recommendations regarding the difficulty he is having with his swelling

## 2012-11-16 NOTE — Telephone Encounter (Signed)
[place] was negative for DVT called her to advise. Apologized for the delay to them. Told his wife he should have been advised of the results at the time of the study.

## 2012-11-16 NOTE — Telephone Encounter (Signed)
Patients wife called on her husbands behalf to ask Dr. Cleta Alberts,  waiting results for doppler on William Hebert's leg, and have not heard anything from our office or [facility] (been over several weeks)  because they were told to call [facility] to ask why they have not received results on William Hebert's leg from [facility]. Patient wants to be notified why no one has called him for his results.Please call when results are available:  Best number is:  [phone]

## 2012-11-18 ENCOUNTER — Telehealth: Payer: Self-pay

## 2012-11-18 ENCOUNTER — Other Ambulatory Visit: Payer: Self-pay

## 2012-11-18 ENCOUNTER — Encounter: Payer: Self-pay | Admitting: Vascular Surgery

## 2012-11-18 DIAGNOSIS — I872 Venous insufficiency (chronic) (peripheral): Secondary | ICD-10-CM

## 2012-11-18 NOTE — Telephone Encounter (Signed)
Patients wife states she needs the rx mailed to her house for her husbands support stocking. She says it was mailed to [facility] but she needs to have the rx in hand for insurance to pay for it.

## 2012-11-21 MED ORDER — JOBST ANTI-EM KNEE HIGH MED MISC: 30.0000 mmol | Status: DC

## 2012-11-21 NOTE — Telephone Encounter (Signed)
At [facility] desk to be picked up.

## 2012-11-21 NOTE — Telephone Encounter (Signed)
It has been mailed to her.

## 2012-12-17 ENCOUNTER — Other Ambulatory Visit: Payer: Self-pay | Admitting: *Deleted

## 2012-12-17 MED ORDER — NIFEDIPINE ER OSMOTIC RELEASE 60 MG PO TB24: 60.0000 mg | ORAL_TABLET | Freq: Every day | ORAL | Status: DC

## 2012-12-23 ENCOUNTER — Encounter: Payer: 59 | Admitting: Vascular Surgery

## 2012-12-27 ENCOUNTER — Other Ambulatory Visit: Payer: Self-pay | Admitting: Radiology

## 2013-01-30 ENCOUNTER — Encounter: Payer: Self-pay | Admitting: Vascular Surgery

## 2013-01-31 ENCOUNTER — Encounter (INDEPENDENT_AMBULATORY_CARE_PROVIDER_SITE_OTHER): Payer: 59 | Admitting: *Deleted

## 2013-01-31 ENCOUNTER — Encounter: Payer: Self-pay | Admitting: Vascular Surgery

## 2013-01-31 ENCOUNTER — Ambulatory Visit (INDEPENDENT_AMBULATORY_CARE_PROVIDER_SITE_OTHER): Payer: 59 | Admitting: Vascular Surgery

## 2013-01-31 VITALS — BP 163/80 | HR 56 | Resp 20 | Ht 70.0 in | Wt 322.0 lb

## 2013-01-31 DIAGNOSIS — M7989 Other specified soft tissue disorders: Secondary | ICD-10-CM | POA: Insufficient documentation

## 2013-01-31 DIAGNOSIS — I872 Venous insufficiency (chronic) (peripheral): Secondary | ICD-10-CM

## 2013-01-31 NOTE — Progress Notes (Signed)
Subjective:     Patient ID: William Hebert, male   DOB: [birth year], 67 y.o.   MRN: [id number]  HPI this 67 year old male was evaluated bilateral lower extremity edema right worse than left. Patient had right foot and calf surgery-extensive by Dr. Victorino Dike in the past and since then the swelling has become more severe. He has also developed swelling in the contralateral left leg. He has no history of DVT and thrombophlebitis. He has tried wearing elastic compression stockings but has difficulty putting him on. He is not elevate his legs.  Past Medical History  Diagnosis Date  . HTN (hypertension)   . GERD (gastroesophageal reflux disease)   . Testosterone deficiency   . Hyperlipidemia   . Morbidly obese   . Headache(784.0)   . Double vision   . Shortness of breath   . Arthritis     History  Substance Use Topics  . Smoking status: Former Smoker -- 2.00 packs/day for 40 years    Types: Cigarettes    Quit date: 08/31/2005  . Smokeless tobacco: Former Neurosurgeon  . Alcohol Use: No    Family History  Problem Relation Age of Onset  . Cancer Mother   . Heart disease Maternal Aunt     No Known Allergies  Current outpatient prescriptions:Ascorbic Acid (VITAMIN C) 1000 MG tablet, Take 1,000 mg by mouth daily., Disp: , Rfl: ;  B Complex Vitamins (VITAMIN B COMPLEX PO), Take by mouth daily., Disp: , Rfl: ;  b complex vitamins tablet, Take 1 tablet by mouth daily., Disp: , Rfl: ;  BYSTOLIC 20 MG TABS, Take 20 mg by mouth 2 (two) times daily., Disp: , Rfl:  cyclobenzaprine (FLEXERIL) 5 MG tablet, 1 pill by mouth up to every 8 hours as needed. Start with one pill by mouth each bedtime as needed due to sedation, Disp: 15 tablet, Rfl: 0;  diclofenac (VOLTAREN) 75 MG EC tablet, Take 1 tablet by mouth Twice daily., Disp: , Rfl: ;  Elastic Bandages & Supports (JOBST ANTI-EM KNEE HIGH MED) MISC, 30 mmol by Does not apply route 1 day or 1 dose. 30mm compression hose with zipper/, Disp: 2 each, Rfl: 0 Misc  Natural Products (OSTEO BI-FLEX ADV DOUBLE ST PO), Take by mouth.  , Disp: , Rfl: ;  NIFEdipine (PROCARDIA XL/ADALAT-CC) 60 MG 24 hr tablet, Take 1 tablet (60 mg total) by mouth daily., Disp: 90 tablet, Rfl: 2;  omeprazole (PRILOSEC) 20 MG capsule, Take 1 capsule by mouth two times daily, Disp: 60 capsule, Rfl: 2;  OVER THE COUNTER MEDICATION, 1 tablet daily. Calcium magnesium complex 1000-500mg , Disp: , Rfl:  potassium chloride (K-DUR,KLOR-CON) 10 MEQ tablet, Take 1 tablet by mouth Daily., Disp: , Rfl: ;  pravastatin (PRAVACHOL) 40 MG tablet, Take 2 tablets (80 mg  total) by mouth daily., Disp: 180 tablet, Rfl: 1;  pyridOXINE (VITAMIN B-6) 100 MG tablet, Take 100 mg by mouth daily. , Disp: , Rfl: ;  sildenafil (VIAGRA) 100 MG tablet, Take 0.5-1 tablets (50-100 mg total) by mouth daily as needed for erectile dysfunction., Disp: 5 tablet, Rfl: 4 testosterone (ANDROGEL) 50 MG/5GM GEL, 2 tubes daily, Disp: , Rfl: ;  aspirin 81 MG tablet, Take 81 mg by mouth daily.  , Disp: , Rfl: ;  BYSTOLIC 10 MG tablet, Take 1 tablet by mouth 2 (two) times daily. 20mg  bid, Disp: , Rfl: ;  famotidine (PEPCID) 20 MG tablet, Take 20 mg by mouth daily. , Disp: , Rfl: ;  fish oil-omega-3 fatty acids  1000 MG capsule, Take 1,200 mg by mouth daily.  , Disp: , Rfl:  LEVITRA 20 MG tablet, TAKE 1/2 TO 1 TABLET BY MOUTH AS NEEDED, Disp: 5 tablet, Rfl: 0;  meloxicam (MOBIC) 15 MG tablet, Take 15 mg by mouth daily.  , Disp: , Rfl: ;  spironolactone (ALDACTONE) 25 MG tablet, Take 25 mg by mouth daily.  , Disp: , Rfl: ;  traMADol (ULTRAM) 50 MG tablet, Take 50 mg by mouth 3 (three) times daily as needed. , Disp: , Rfl:  triamterene-hydrochlorothiazide (DYAZIDE) 37.5-25 MG per capsule, Take 1 capsule by mouth every morning.  , Disp: , Rfl:   BP 163/80  Pulse 56  Resp 20  Ht 5\' 10"  (1.778 m)  Wt 322 lb (146.058 kg)  BMI 46.2 kg/m2  Body mass index is 46.2 kg/(m^2).           Review of Systems multiple complaints including  dyspnea on exertion, obesity, pain in feet when lying flat, varicose veins, productive cough, dizziness.     Objective:   Physical Exam blood pressure 163/80 heart rate 86 respirations 20 General obese male in no apparent stress alert and oriented x3 Lungs no rhonchi or wheezing Lower extremities with diffuse edema bilaterally. Right calf about 1-1/2-2 cm larger in circumference than left. Palpable dorsalis pedis pulse bilaterally. No active ulcerations noted. Diffuse spider veins bilaterally and distal thighs.  Today I ordered bilateral venous duplex exam which I reviewed and interpreted. There is no reflux and grade or small saphenous veins of significance bilaterally. There is no DVT. There is some deep vein reflux bilaterally.    Assessment:     Bilateral edema right worse than left. Status post extensive orthopedic surgery right lower extremity in the past few years No history or evidence of DVT Mild deep vein reflux    Plan:     #1 elevate head of bed 2-3 inches #2 we'll remeasure for short-leg elastic compression stockings which must be placed on legs immediately in the a.m. prior to arising #3 no further suggestions-no indication for any other vascular workup

## 2013-03-02 ENCOUNTER — Other Ambulatory Visit: Payer: Self-pay | Admitting: Physician Assistant

## 2013-03-26 ENCOUNTER — Other Ambulatory Visit: Payer: Self-pay | Admitting: Physician Assistant

## 2013-04-06 ENCOUNTER — Encounter (INDEPENDENT_AMBULATORY_CARE_PROVIDER_SITE_OTHER): Payer: Self-pay | Admitting: Surgery

## 2013-04-06 ENCOUNTER — Ambulatory Visit (INDEPENDENT_AMBULATORY_CARE_PROVIDER_SITE_OTHER): Payer: 59 | Admitting: Surgery

## 2013-04-06 VITALS — BP 138/78 | HR 64 | Temp 98.1°F | Resp 15 | Ht 71.0 in | Wt 313.0 lb

## 2013-04-06 DIAGNOSIS — R229 Localized swelling, mass and lump, unspecified: Secondary | ICD-10-CM

## 2013-04-06 DIAGNOSIS — R222 Localized swelling, mass and lump, trunk: Secondary | ICD-10-CM

## 2013-04-06 NOTE — Patient Instructions (Signed)
Lipoma A lipoma is a noncancerous (benign) tumor composed of fat cells. They are usually found under the skin (subcutaneous). A lipoma may occur in any tissue of the body that contains fat. Common areas for lipomas to appear include the back, shoulders, buttocks, and thighs. Lipomas are a very common soft tissue growth. They are soft and grow slowly. Most problems caused by a lipoma depend on where it is growing. DIAGNOSIS  A lipoma can be diagnosed with a physical exam. These tumors rarely become cancerous, but radiographic studies can help determine this for certain. Studies used may include:  Computerized X-ray scans (CT or CAT scan).  Computerized magnetic scans (MRI). TREATMENT  Small lipomas that are not causing problems may be watched. If a lipoma continues to enlarge or causes problems, removal is often the best treatment. Lipomas can also be removed to improve appearance. Surgery is done to remove the fatty cells and the surrounding capsule. Most often, this is done with medicine that numbs the area (local anesthetic). The removed tissue is examined under a microscope to make sure it is not cancerous. Keep all follow-up appointments with your caregiver. SEEK MEDICAL CARE IF:   The lipoma becomes larger or hard.  The lipoma becomes painful, red, or increasingly swollen. These could be signs of infection or a more serious condition. Document Released: 08/07/2002 Document Revised: 11/09/2011 Document Reviewed: 01/17/2010 ExitCare Patient Information 2014 ExitCare,  GENERAL SURGERY: POST OP INSTRUCTIONS  1. DIET: Follow a light bland diet the first 24 hours after arrival home, such as soup, liquids, crackers, etc.  Be sure to include lots of fluids daily.  Avoid fast food or heavy meals as your are more likely to get nauseated.   2. Take your usually prescribed home medications unless otherwise directed. 3. PAIN CONTROL: a. Pain is best controlled by a usual combination of three  different methods TOGETHER: i. Ice/Heat ii. Over the counter pain medication iii. Prescription pain medication b. Most patients will experience some swelling and bruising around the incisions.  Ice packs or heating pads (30-60 minutes up to 6 times a day) will help. Use ice for the first few days to help decrease swelling and bruising, then switch to heat to help relax tight/sore spots and speed recovery.  Some people prefer to use ice alone, heat alone, alternating between ice & heat.  Experiment to what works for you.  Swelling and bruising can take several weeks to resolve.   c. It is helpful to take an over-the-counter pain medication regularly for the first few weeks.  Choose one of the following that works best for you: i. Naproxen (Aleve, etc)  Two 220mg  tabs twice a day ii. Ibuprofen (Advil, etc) Three 200mg  tabs four times a day (every meal & bedtime) iii. Acetaminophen (Tylenol, etc) 500-650mg  four times a day (every meal & bedtime) d. A  prescription for pain medication (such as oxycodone, hydrocodone, etc) should be given to you upon discharge.  Take your pain medication as prescribed.  i. If you are having problems/concerns with the prescription medicine (does not control pain, nausea, vomiting, rash, itching, etc), please call us [phone] to see if we need to switch you to a different pain medicine that will work better for you and/or control your side effect better. ii. If you need a refill on your pain medication, please contact your pharmacy.  They will contact our office to request authorization. Prescriptions will not be filled after 5 pm or on week-ends. 4. Avoid  getting constipated.  Between the surgery and the pain medications, it is common to experience some constipation.  Increasing fluid intake and taking a fiber supplement (such as Metamucil, Citrucel, FiberCon, MiraLax, etc) 1-2 times a day regularly will usually help prevent this problem from occurring.  A mild laxative  (prune juice, Milk of Magnesia, MiraLax, etc) should be taken according to package directions if there are no bowel movements after 48 hours.   5. Wash / shower every day.  You may shower over the dressings as they are waterproof.  Continue to shower over incision(s) after the dressing is off. 6. Remove your waterproof bandages 5 days after surgery.  You may leave the incision open to air.  You may have skin tapes (Steri Strips) covering the incision(s).  Leave them on until one week, then remove.  You may replace a dressing/Band-Aid to cover the incision for comfort if you wish.      7. ACTIVITIES as tolerated:   a. You may resume regular (light) daily activities beginning the next day-such as daily self-care, walking, climbing stairs-gradually increasing activities as tolerated.  If you can walk 30 minutes without difficulty, it is safe to try more intense activity such as jogging, treadmill, bicycling, low-impact aerobics, swimming, etc. b. Save the most intensive and strenuous activity for last such as sit-ups, heavy lifting, contact sports, etc  Refrain from any heavy lifting or straining until you are off narcotics for pain control.   c. DO NOT PUSH THROUGH PAIN.  Let pain be your guide: If it hurts to do something, don't do it.  Pain is your body warning you to avoid that activity for another week until the pain goes down. d. You may drive when you are no longer taking prescription pain medication, you can comfortably wear a seatbelt, and you can safely maneuver your car and apply brakes. e. Bonita Quin may have sexual intercourse when it is comfortable.  8. FOLLOW UP in our office a. Please call [facility] at [phone] to set up an appointment to see your surgeon in the office for a follow-up appointment approximately 2-3 weeks after your surgery. b. Make sure that you call for this appointment the day you arrive home to insure a convenient appointment time. 9. IF YOU HAVE DISABILITY OR FAMILY LEAVE  FORMS, BRING THEM TO THE OFFICE FOR PROCESSING.  DO NOT GIVE THEM TO YOUR DOCTOR.   WHEN TO CALL us [phone]: 1. Poor pain control 2. Reactions / problems with new medications (rash/itching, nausea, etc)  3. Fever over 101.5 F (38.5 C) 4. Worsening swelling or bruising 5. Continued bleeding from incision. 6. Increased pain, redness, or drainage from the incision 7. Difficulty breathing / swallowing   The clinic staff is available to answer your questions during regular business hours (8:30am-5pm).  Please don't hesitate to call and ask to speak to one of our nurses for clinical concerns.   If you have a medical emergency, go to the nearest emergency room or call 911.  A surgeon from [facility] is always on call at the [facility], [place] [address], [place], [place]  [address] ? MAIN: [phone] ? TOLL FREE: [phone] ?  FAX [phone] [email or web address]

## 2013-04-06 NOTE — Progress Notes (Signed)
Patient ID: William Hebert, male   DOB: Jul 21, 1946, 67 y.o.   MRN: [id number]  Chief Complaint  Patient presents with  . New Evaluation    eval mass on back / also possible melanoma on back     HPI William Hebert is a 67 y.o. male.  Patient sent at the request of Dr.Stinehelfer for mass on upper back. He had an area removed there before. The area is returning and  got larger and more painful. Denies redness or drainage. Has had recent surgery for basal cell carcinoma on his face. His wife wants me to look at multiple skin lesions on his back. HPI  Past Medical History  Diagnosis Date  . HTN (hypertension)   . GERD (gastroesophageal reflux disease)   . Testosterone deficiency   . Hyperlipidemia   . Morbidly obese   . Headache(784.0)   . Double vision   . Shortness of breath   . Arthritis   . Cancer     Past Surgical History  Procedure Laterality Date  . Knee surgery  x 2  . Umbilical hernia repair    . Abdominal aortic aneurysm repair  05/20/11    aorto bifem. stent graft  . Foot tendon surgery  rt foot  . Skin surgery      3 different surgeries for melanoma    Family History  Problem Relation Age of Onset  . Cancer Mother   . Heart disease Maternal Aunt     Social History History  Substance Use Topics  . Smoking status: Former Smoker -- 2.00 packs/day for 40 years    Types: Cigarettes    Quit date: 08/31/2005  . Smokeless tobacco: Former Neurosurgeon  . Alcohol Use: No    No Known Allergies  Current Outpatient Prescriptions  Medication Sig Dispense Refill  . Ascorbic Acid (VITAMIN C) 1000 MG tablet Take 1,000 mg by mouth daily.      . B Complex Vitamins (VITAMIN B COMPLEX PO) Take by mouth daily.      [name] b complex vitamins tablet Take 1 tablet by mouth daily.      [name] BYSTOLIC 20 MG TABS Take 20 mg by mouth 2 (two) times daily.      . diclofenac (VOLTAREN) 75 MG EC tablet Take 1 tablet by mouth Twice daily.      . Misc Natural Products (OSTEO BI-FLEX ADV DOUBLE ST PO)  Take by mouth.        [name] NIFEdipine (PROCARDIA XL/ADALAT-CC) 60 MG 24 hr tablet Take 1 tablet (60 mg total) by mouth daily.  90 tablet  2  . omeprazole (PRILOSEC) 20 MG capsule Take 1 capsule (20 mg total) by mouth 2 (two) times daily. PATIENT NEEDS OFFICE VISIT FOR ADDITIONAL REFILLS  60 capsule  0  . OVER THE COUNTER MEDICATION 1 tablet daily. Calcium magnesium complex 1000-500mg       . potassium chloride (K-DUR,KLOR-CON) 10 MEQ tablet Take 1 tablet by mouth Daily.      . pravastatin (PRAVACHOL) 40 MG tablet Take 2 tablets (80 mg  total) by mouth daily.  180 tablet  1  . sildenafil (VIAGRA) 100 MG tablet Take 0.5-1 tablets (50-100 mg total) by mouth daily as needed for erectile dysfunction.  5 tablet  4  . testosterone (ANDROGEL) 50 MG/5GM GEL 2 tubes daily      . aspirin 81 MG tablet Take 81 mg by mouth daily.        [name] pyridOXINE (VITAMIN B-6) 100 MG  tablet Take 100 mg by mouth daily.       [name] spironolactone (ALDACTONE) 25 MG tablet Take 25 mg by mouth daily.         No current facility-administered medications for this visit.    Review of Systems Review of Systems  Constitutional: Negative for fever, chills and unexpected weight change.  HENT: Negative for hearing loss, congestion, sore throat, trouble swallowing and voice change.   Eyes: Negative for visual disturbance.  Respiratory: Negative for cough and wheezing.   Cardiovascular: Negative for chest pain, palpitations and leg swelling.  Gastrointestinal: Negative for nausea, vomiting, abdominal pain, diarrhea, constipation, blood in stool, abdominal distention, anal bleeding and rectal pain.  Genitourinary: Negative for hematuria and difficulty urinating.  Musculoskeletal: Negative for arthralgias.  Skin: Negative for rash and wound.  Neurological: Negative for seizures, syncope, weakness and headaches.  Hematological: Negative for adenopathy. Does not bruise/bleed easily.  Psychiatric/Behavioral: Negative for confusion.    Blood  pressure 138/78, pulse 64, temperature 98.1 F (36.7 C), temperature source Temporal, resp. rate 15, height 5\' 11"  (1.803 m), weight 313 lb (141.976 kg).  Physical Exam Physical Exam  Constitutional: He is oriented to person, place, and time. He appears well-developed and well-nourished.  HENT:  Head: Normocephalic and atraumatic.  Scar on face  Eyes: Pupils are equal, round, and reactive to light. No scleral icterus.  Neck: Normal range of motion. Neck supple.  Cardiovascular: Normal rate and regular rhythm.   Pulmonary/Chest: Effort normal and breath sounds normal.  Musculoskeletal: Normal range of motion.  Neurological: He is alert and oriented to person, place, and time.  Skin:     Multiple skin lesions. These all appear benign. Over his back.    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 The risk and benefits and alternatives were discussed. He has multiple skin lesions on his back but none look suspicious currently to me.The procedure has been discussed with the patient.  Alternative therapies have been discussed with the patient.  Operative risks include bleeding,  Infection,  Organ injury,  Nerve injury,  Blood vessel injury,  DVT,  Pulmonary embolism,  Death,  And possible reoperation.  Medical management risks include worsening of present situation.  The success of the procedure is 50 -90 % at treating patients symptoms.  The patient understands and agrees to proceed.       Ab Leaming A. 04/06/2013, 2:24 PM

## 2013-04-11 ENCOUNTER — Other Ambulatory Visit: Payer: Self-pay | Admitting: Emergency Medicine

## 2013-05-03 ENCOUNTER — Encounter (HOSPITAL_BASED_OUTPATIENT_CLINIC_OR_DEPARTMENT_OTHER): Payer: Self-pay | Admitting: *Deleted

## 2013-05-03 NOTE — Progress Notes (Signed)
Instructed patient to have EKG and Bmet prior to surgery. During PAT appt, anesthesiologist may want to evaluate. Will notify PAT nurse.

## 2013-05-08 ENCOUNTER — Encounter (HOSPITAL_BASED_OUTPATIENT_CLINIC_OR_DEPARTMENT_OTHER)
Admission: RE | Admit: 2013-05-08 | Discharge: 2013-05-08 | Disposition: A | Discharge status: Home / Self Care | Payer: 59 | Source: Ambulatory Visit | Attending: Surgery | Admitting: Surgery

## 2013-05-08 ENCOUNTER — Other Ambulatory Visit: Payer: Self-pay

## 2013-05-08 LAB — BASIC METABOLIC PANEL
BUN: 15 mg/dL (ref 6–23)
CO2: 27 mEq/L (ref 19–32)
Calcium: 9 mg/dL (ref 8.4–10.5)
Chloride: 102 mEq/L (ref 96–112)
Creatinine, Ser: 0.99 mg/dL (ref 0.50–1.35)
GFR calc Af Amer: >90 mL/min (ref >90)
GFR calc non Af Amer: 84 mL/min — ABNORMAL LOW (ref >90)
Glucose, Bld: 144 mg/dL — ABNORMAL HIGH (ref 70–99)
Potassium: 4.2 mEq/L (ref 3.5–5.1)
Sodium: 140 mEq/L (ref 135–145)

## 2013-05-08 NOTE — Progress Notes (Signed)
Dr crews evaluated pt airway for surgery when he came in for labs, ok for dsc

## 2013-05-10 ENCOUNTER — Encounter (HOSPITAL_BASED_OUTPATIENT_CLINIC_OR_DEPARTMENT_OTHER)
Admission: RE | Disposition: A | Discharge status: Home / Self Care | Payer: Self-pay | Source: Ambulatory Visit | Attending: Surgery

## 2013-05-10 ENCOUNTER — Encounter (HOSPITAL_BASED_OUTPATIENT_CLINIC_OR_DEPARTMENT_OTHER): Payer: Self-pay

## 2013-05-10 ENCOUNTER — Encounter (HOSPITAL_BASED_OUTPATIENT_CLINIC_OR_DEPARTMENT_OTHER): Payer: Self-pay | Admitting: *Deleted

## 2013-05-10 ENCOUNTER — Ambulatory Visit (HOSPITAL_BASED_OUTPATIENT_CLINIC_OR_DEPARTMENT_OTHER)
Admission: RE | Admit: 2013-05-10 | Discharge: 2013-05-10 | Disposition: A | Discharge status: Home / Self Care | Payer: 59 | Source: Ambulatory Visit | Attending: Surgery | Admitting: Surgery

## 2013-05-10 ENCOUNTER — Ambulatory Visit (HOSPITAL_BASED_OUTPATIENT_CLINIC_OR_DEPARTMENT_OTHER): Payer: 59 | Admitting: *Deleted

## 2013-05-10 DIAGNOSIS — L723 Sebaceous cyst: Secondary | ICD-10-CM

## 2013-05-10 DIAGNOSIS — N529 Male erectile dysfunction, unspecified: Secondary | ICD-10-CM

## 2013-05-10 DIAGNOSIS — I10 Essential (primary) hypertension: Secondary | ICD-10-CM

## 2013-05-10 DIAGNOSIS — I714 Abdominal aortic aneurysm, without rupture: Secondary | ICD-10-CM

## 2013-05-10 DIAGNOSIS — M7989 Other specified soft tissue disorders: Secondary | ICD-10-CM

## 2013-05-10 DIAGNOSIS — G4733 Obstructive sleep apnea (adult) (pediatric): Secondary | ICD-10-CM

## 2013-05-10 DIAGNOSIS — R0600 Dyspnea, unspecified: Secondary | ICD-10-CM

## 2013-05-10 DIAGNOSIS — E782 Mixed hyperlipidemia: Secondary | ICD-10-CM

## 2013-05-10 HISTORY — PX: MASS EXCISION: SHX2000

## 2013-05-10 LAB — POCT HEMOGLOBIN-HEMACUE: Hemoglobin: 15 g/dL (ref 13.0–17.0)

## 2013-05-10 SURGERY — EXCISION MASS
Anesthesia: General | Site: Back | Wound class: Clean

## 2013-05-10 MED ORDER — LACTATED RINGERS IV SOLN
INTRAVENOUS | Status: DC
  Administered 2013-05-10 (×2): via INTRAVENOUS

## 2013-05-10 MED ORDER — DEXTROSE 5 % IV SOLN
3.0000 g | INTRAVENOUS | Status: AC
  Administered 2013-05-10: 3 g via INTRAVENOUS

## 2013-05-10 MED ORDER — SUCCINYLCHOLINE CHLORIDE 20 MG/ML IJ SOLN
INTRAMUSCULAR | Status: DC | PRN
  Administered 2013-05-10: 200 mg via INTRAVENOUS

## 2013-05-10 MED ORDER — ONDANSETRON HCL 4 MG/2ML IJ SOLN
INTRAMUSCULAR | Status: DC | PRN
  Administered 2013-05-10: 4 mg via INTRAVENOUS

## 2013-05-10 MED ORDER — OXYCODONE HCL 5 MG PO TABS: 5.0000 mg | ORAL_TABLET | Freq: Once | ORAL | Status: DC | PRN

## 2013-05-10 MED ORDER — ONDANSETRON HCL 4 MG/2ML IJ SOLN: 4.0000 mg | Freq: Once | INTRAMUSCULAR | Status: DC | PRN

## 2013-05-10 MED ORDER — HYDROMORPHONE HCL PF 1 MG/ML IJ SOLN: 0.2500 mg | INTRAMUSCULAR | Status: DC | PRN

## 2013-05-10 MED ORDER — FENTANYL CITRATE 0.05 MG/ML IJ SOLN
INTRAMUSCULAR | Status: DC | PRN
  Administered 2013-05-10: 100 ug via INTRAVENOUS

## 2013-05-10 MED ORDER — MIDAZOLAM HCL 5 MG/5ML IJ SOLN
INTRAMUSCULAR | Status: DC | PRN
  Administered 2013-05-10: 2 mg via INTRAVENOUS

## 2013-05-10 MED ORDER — EPHEDRINE SULFATE 50 MG/ML IJ SOLN
INTRAMUSCULAR | Status: DC | PRN
  Administered 2013-05-10 (×2): 10 mg via INTRAVENOUS

## 2013-05-10 MED ORDER — CHLORHEXIDINE GLUCONATE 4 % EX LIQD: 1.0000 "application " | Freq: Once | CUTANEOUS | Status: DC

## 2013-05-10 MED ORDER — GLYCOPYRROLATE 0.2 MG/ML IJ SOLN
INTRAMUSCULAR | Status: DC | PRN
  Administered 2013-05-10: 0.2 mg via INTRAVENOUS

## 2013-05-10 MED ORDER — LIDOCAINE HCL (CARDIAC) 20 MG/ML IV SOLN
INTRAVENOUS | Status: DC | PRN
  Administered 2013-05-10: 100 mg via INTRAVENOUS
  Administered 2013-05-10: 50 mg via INTRAVENOUS

## 2013-05-10 MED ORDER — PROPOFOL 10 MG/ML IV BOLUS
INTRAVENOUS | Status: DC | PRN
  Administered 2013-05-10: 50 mg via INTRAVENOUS
  Administered 2013-05-10: 300 mg via INTRAVENOUS

## 2013-05-10 MED ORDER — DEXAMETHASONE SODIUM PHOSPHATE 4 MG/ML IJ SOLN
INTRAMUSCULAR | Status: DC | PRN
  Administered 2013-05-10: 10 mg via INTRAVENOUS

## 2013-05-10 MED ORDER — OXYCODONE-ACETAMINOPHEN 5-325 MG PO TABS: 2.0000 | ORAL_TABLET | ORAL | Status: DC | PRN

## 2013-05-10 MED ORDER — BUPIVACAINE-EPINEPHRINE 0.25% -1:200000 IJ SOLN
INTRAMUSCULAR | Status: DC | PRN
  Administered 2013-05-10: 20 mL

## 2013-05-10 MED ORDER — OXYCODONE HCL 5 MG/5ML PO SOLN: 5.0000 mg | Freq: Once | ORAL | Status: DC | PRN

## 2013-05-10 SURGICAL SUPPLY — 46 items
ADH SKN CLS APL DERMABOND .7 (GAUZE/BANDAGES/DRESSINGS) ×1
APL SKNCLS STERI-STRIP NONHPOA (GAUZE/BANDAGES/DRESSINGS)
BENZOIN TINCTURE PRP APPL 2/3 (GAUZE/BANDAGES/DRESSINGS) IMPLANT
BLADE SURG 10 STRL SS (BLADE) IMPLANT
BLADE SURG 15 STRL LF DISP TIS (BLADE) ×1 IMPLANT
BLADE SURG 15 STRL SS (BLADE) ×2
CANISTER SUCTION 1200CC (MISCELLANEOUS) IMPLANT
CHLORAPREP W/TINT 26ML (MISCELLANEOUS) ×2 IMPLANT
COVER MAYO STAND STRL (DRAPES) ×2 IMPLANT
COVER TABLE BACK 60X90 (DRAPES) ×3 IMPLANT
DECANTER SPIKE VIAL GLASS SM (MISCELLANEOUS) IMPLANT
DERMABOND ADVANCED (GAUZE/BANDAGES/DRESSINGS) ×1
DERMABOND ADVANCED .7 DNX12 (GAUZE/BANDAGES/DRESSINGS) IMPLANT
DRAPE PED LAPAROTOMY (DRAPES) ×2 IMPLANT
DRAPE SURG 17X23 STRL (DRAPES) ×1 IMPLANT
DRAPE UTILITY XL STRL (DRAPES) ×2 IMPLANT
ELECT COATED BLADE 2.86 ST (ELECTRODE) ×2 IMPLANT
ELECT REM PT RETURN 9FT ADLT (ELECTROSURGICAL) ×2
ELECTRODE REM PT RTRN 9FT ADLT (ELECTROSURGICAL) ×1 IMPLANT
GLOVE BIO SURGEON STRL SZ 6.5 (GLOVE) ×1 IMPLANT
GLOVE BIOGEL PI IND STRL 8 (GLOVE) ×1 IMPLANT
GLOVE BIOGEL PI INDICATOR 8 (GLOVE) ×1
GLOVE ECLIPSE 6.5 STRL STRAW (GLOVE) ×1 IMPLANT
GLOVE ECLIPSE 8.0 STRL XLNG CF (GLOVE) ×2 IMPLANT
GOWN PREVENTION PLUS XLARGE (GOWN DISPOSABLE) ×2 IMPLANT
NDL HYPO 25X1 1.5 SAFETY (NEEDLE) ×1 IMPLANT
NEEDLE HYPO 25X1 1.5 SAFETY (NEEDLE) ×2 IMPLANT
NS IRRIG 1000ML POUR BTL (IV SOLUTION) ×1 IMPLANT
PACK BASIN DAY SURGERY FS (CUSTOM PROCEDURE TRAY) ×2 IMPLANT
PENCIL BUTTON HOLSTER BLD 10FT (ELECTRODE) ×2 IMPLANT
SLEEVE SCD COMPRESS KNEE MED (MISCELLANEOUS) ×2 IMPLANT
SPONGE LAP 4X18 X RAY DECT (DISPOSABLE) IMPLANT
STAPLER VISISTAT 35W (STAPLE) IMPLANT
STRIP CLOSURE SKIN 1/2X4 (GAUZE/BANDAGES/DRESSINGS) IMPLANT
SUT MNCRL AB 3-0 PS2 18 (SUTURE) ×1 IMPLANT
SUT MON AB 4-0 PC3 18 (SUTURE) ×2 IMPLANT
SUT VIC AB 2-0 SH 27 (SUTURE) ×4
SUT VIC AB 2-0 SH 27XBRD (SUTURE) IMPLANT
SUT VIC AB 3-0 SH 27 (SUTURE) ×2
SUT VIC AB 3-0 SH 27X BRD (SUTURE) ×1 IMPLANT
SUT VICRYL AB 3 0 TIES (SUTURE) IMPLANT
SYR CONTROL 10ML LL (SYRINGE) ×2 IMPLANT
TOWEL OR 17X24 6PK STRL BLUE (TOWEL DISPOSABLE) ×4 IMPLANT
TOWEL OR NON WOVEN STRL DISP B (DISPOSABLE) ×2 IMPLANT
TUBE CONNECTING 20X1/4 (TUBING) IMPLANT
YANKAUER SUCT BULB TIP NO VENT (SUCTIONS) IMPLANT

## 2013-05-10 NOTE — Op Note (Signed)
William Hebert, William Hebert NO.:  [id number]  MEDICAL RECORD NO.:  [id number]  LOCATION:                                 FACILITY:  PHYSICIAN:  Jaydan Chretien A. Joshva Labreck, M.D.     DATE OF BIRTH:  DATE OF PROCEDURE:  05/10/2013 DATE OF DISCHARGE:                              OPERATIVE REPORT   PREOPERATIVE DIAGNOSIS:  Mass, upper back, involving the skin and subcutaneous tissue measuring 5 x 5 cm.  POSTOPERATIVE DIAGNOSIS:  Mass, upper back, involving the skin and subcutaneous tissue, measuring 5 x 5 cm.  PROCEDURE:  Wide excision of mass, upper back, skin, and subcutaneous fat measuring 5 x 5 cm.  SURGEON:  Maisie Fus A. Harl Wiechmann, M.D.  ANESTHESIA:  General endotracheal anesthesia, 0.25% Sensorcaine local with epinephrine.  ESTIMATED BLOOD LOSS:  Approximately 30 mL.  SPECIMENS:  Skin and subcutaneous fat and tissue to Pathology.  DRAINS:  None.  IV FLUIDS:  Approximately 500 mL crystalloid.  INDICATIONS FOR PROCEDURE:  The patient is a 67 year old male with enlarging, painful mass over his left scapula.  This appeared to be either a lipoma or expanding epidermal inclusion cyst.  He wished to have it removed due to discomfort.  We discussed risks, benefits, and alternative therapies with the patient as outlined in my history and physical.  He wished to proceed.  DESCRIPTION OF PROCEDURE:  The patient was met in the holding area and questions were answered.  The mass was marked preoperatively.  He was taken back to the operating room and placed supine on the operating room table.  He was then intubated easily and general anesthesia was initiated.  He was then rolled up on his right side and placed in a bean bag and appropriately padded to have his right side down.  The mass was easily seen and this area was prepped and draped in sterile fashion. Time-out was done.  Transverse incision was made over the mass and the skin and subcutaneous fat down to the fascia was  excised to include a large epidermal inclusion cyst.  The wound was irrigated and closed with the deep layer of 3-0 Vicryl, and 3-0 Monocryl was used to close the skin.  This was done in a subcuticular fashion.  Total area excised was 5 x 5 cm including the skin and subcutaneous fat down to the muscle.  Dermabond was applied.  He was then rolled back supine and placed on the stretcher.  He was then extubated in the operating room. He was taken to PACU in stable condition.  All final counts were found to be correct.     Candela Krul A. Tajae Maiolo, M.D.     TAC/MEDQ  D:  05/10/2013  T:  05/10/2013  Job:  [id number]

## 2013-05-10 NOTE — Anesthesia Procedure Notes (Signed)
Procedure Name: Intubation Date/Time: 05/10/2013 10:36 AM Performed by: Meyer Russel Pre-anesthesia Checklist: Patient identified, Emergency Drugs available, Suction available and Patient being monitored Patient Re-evaluated:Patient Re-evaluated prior to inductionOxygen Delivery Method: Circle System Utilized Preoxygenation: Pre-oxygenation with 100% oxygen Intubation Type: IV induction Ventilation: Mask ventilation without difficulty and Oral airway inserted - appropriate to patient size Laryngoscope size: Glide scope #4. Grade View: Grade II Tube type: Oral Tube size: 8.0 mm Number of attempts: 1 Airway Equipment and Method: stylet,  oral airway and Video-laryngoscopy Placement Confirmation: ETT inserted through vocal cords under direct vision,  positive ETCO2 and breath sounds checked- equal and bilateral Secured at: 24 cm Tube secured with: Tape Dental Injury: Teeth and Oropharynx as per pre-operative assessment

## 2013-05-10 NOTE — Anesthesia Preprocedure Evaluation (Signed)
Anesthesia Evaluation  Patient identified by MRN, date of birth, ID band Patient awake    Reviewed: Allergy & Precautions, H&P , NPO status , Patient's Chart, lab work & pertinent test results  Airway Mallampati: II TM Distance: >3 FB Neck ROM: Full    Dental  (+) Teeth Intact and Dental Advisory Given   Pulmonary sleep apnea and Continuous Positive Airway Pressure Ventilation ,  breath sounds clear to auscultation        Cardiovascular hypertension, Pt. on medications Rhythm:Regular Rate:Normal     Neuro/Psych    GI/Hepatic   Endo/Other    Renal/GU      Musculoskeletal   Abdominal   Peds  Hematology   Anesthesia Other Findings   Reproductive/Obstetrics                           Anesthesia Physical Anesthesia Plan  ASA: III  Anesthesia Plan: General   Post-op Pain Management:    Induction: Intravenous  Airway Management Planned: Oral ETT  Additional Equipment:   Intra-op Plan:   Post-operative Plan: Extubation in OR  Informed Consent: I have reviewed the patients History and Physical, chart, labs and discussed the procedure including the risks, benefits and alternatives for the proposed anesthesia with the patient or authorized representative who has indicated his/her understanding and acceptance.   Dental advisory given  Plan Discussed with: CRNA, Anesthesiologist and Surgeon  Anesthesia Plan Comments:         Anesthesia Quick Evaluation

## 2013-05-10 NOTE — Brief Op Note (Signed)
05/10/2013  11:23 AM  PATIENT:  William Hebert  67 y.o. male  PRE-OPERATIVE DIAGNOSIS:  mass back  POST-OPERATIVE DIAGNOSIS:  mass upper back  PROCEDURE:  Procedure(s): EXCISION back MASS (N/A)  SURGEON:  Surgeon(s) and Role:    * Vernor Monnig A. Duran Ohern, MD - Primary   ASSISTANTS: none   ANESTHESIA:   local and general  EBL:  Total I/O In: 1000 [I.V.:1000] Out: -   BLOOD ADMINISTERED:none  DRAINS: none   LOCAL MEDICATIONS USED:  BUPIVICAINE   SPECIMEN:  Source of Specimen:  skin subcutaneous tissue upper back  DISPOSITION OF SPECIMEN:  PATHOLOGY  COUNTS:  YES  TOURNIQUET:  * No tourniquets in log *  DICTATION: .Other Dictation: Dictation Number [id number]  PLAN OF CARE: Discharge to home after PACU  PATIENT DISPOSITION:  PACU - hemodynamically stable.   Delay start of Pharmacological VTE agent (>24hrs) due to surgical blood loss or risk of bleeding: not applicable

## 2013-05-10 NOTE — Transfer of Care (Signed)
Immediate Anesthesia Transfer of Care Note  Patient: William Hebert  Procedure(s) Performed: Procedure(s): EXCISION back MASS (N/A)  Patient Location: PACU  Anesthesia Type:General  Level of Consciousness: awake, alert  and oriented  Airway & Oxygen Therapy: Patient Spontanous Breathing and Patient connected to face mask oxygen  Post-op Assessment: Report given to PACU RN, Post -op Vital signs reviewed and stable and Patient moving all extremities  Post vital signs: Reviewed and stable  Complications: No apparent anesthesia complications

## 2013-05-10 NOTE — Anesthesia Postprocedure Evaluation (Signed)
  Anesthesia Post-op Note  Patient: William Hebert  Procedure(s) Performed: Procedure(s): EXCISION back MASS (N/A)  Patient Location: PACU  Anesthesia Type:General  Level of Consciousness: awake, alert  and oriented  Airway and Oxygen Therapy: Patient Spontanous Breathing  Post-op Pain: mild  Post-op Assessment: Post-op Vital signs reviewed  Post-op Vital Signs: Reviewed  Complications: No apparent anesthesia complications

## 2013-05-10 NOTE — H&P (Signed)
Demographics William Hebert 67 year old male  Comm Pref: None [address]  [facility] [place] [address] [phone] (H) [phone] (W) Works at [employer]  Problem ListUnprioritized  Dyspnea  Obstructive sleep apnea  Leaking abdominal aortic aneurysm  Mixed dyslipidemia  Hypertension  Erectile dysfunction  Swelling of limb  Significant History/Details  Smoking: Former Smoker (Quit Date:08/31/2005), 2 ppd, 80 pack-years  Smokeless Tobacco: Former Neurosurgeon  Alcohol: No  4 open orders  Preferred Language: English   Date of Birth1947/10/02Specialty CommentsEditShow AllReportPHI SIGNED FOR DARLENE Cillo (WIFE) DOB [birth year] GM/04-06-13 DOS 9.10.14 [facility]-OP Excision Back Mass/8.7.14 [id number] tlc 04/06/2013 pt schd for op surgery 05/10/2013 @ [facility] no precert req'd. (tlc,chm)    MedicationsLong-Term  Ascorbic Acid (VITAMIN C) 1000 MG tablet    aspirin 81 MG tablet    B Complex Vitamins (VITAMIN B COMPLEX PO)    b complex vitamins tablet   BYSTOLIC 20 MG TABS    Misc Natural Products (OSTEO BI-FLEX ADV DOUBLE ST PO)   NIFEdipine (PROCARDIA XL/ADALAT-CC) 60 MG 24 hr tablet   omeprazole (PRILOSEC) 20 MG capsule    OVER THE COUNTER MEDICATION   pravastatin (PRAVACHOL) 40 MG tablet    pyridOXINE (VITAMIN B-6) 100 MG tablet   sildenafil (VIAGRA) 100 MG tablet   spironolactone (ALDACTONE) 25 MG tablet   testosterone (ANDROGEL) 50 MG/5GM GEL    diclofenac (VOLTAREN) 75 MG EC tablet   potassium chloride (K-DUR,KLOR-CON) 10 MEQ tablet     Relevant Labs (3 years)  Na K Cl C02 WBC Hgb Hct Plts  05/08/13 1230 140 4.2 102 -- -- -- -- --  09/26/12 1505* -- -- -- -- 8.5 15.3 44.8 203  09/26/12 1505* 140 3.4 102 -- -- -- -- --  11/28/11 1616 -- -- -- -- 8.2 13.9 43.5 --  11/28/11 1615* 139 3.6 101 -- -- -- -- --                     Relevant Encounters (Maximum of 5 visits)Date Type Department Provider Description  05/10/2013 Surgery [facility]  Dortha Schwalbe., MD   04/06/2013 Office Visit [facility] Dortha Schwalbe., MD Mass On Back (Primary Dx)          My Last Outpatient Progress NoteStatus Last Edited Encounter Date  Signed Thu Apr 06, 2013 2:30 PM EDT 04/06/2013  Patient ID: William Hebert, male   DOB: [birth year], 67 y.o.   MRN: [id number]    Chief Complaint   Patient presents with   .  New Evaluation       eval mass on back / also possible melanoma on back       HPI William Hebert is a 67 y.o. male.  Patient sent at the request of Dr.Stinehelfer for mass on upper back. He had an area removed there before. The area is returning and  got larger and more painful. Denies redness or drainage. Has had recent surgery for basal cell carcinoma on his face. His wife wants me to look at multiple skin lesions on his back. HPI    Past Medical History   Diagnosis  Date   .  HTN (hypertension)     .  GERD (gastroesophageal reflux disease)     .  Testosterone deficiency     .  Hyperlipidemia     .  Morbidly obese     .  Headache(784.0)     .  Double vision     .  Shortness of breath     .  Arthritis     .  Cancer         Past Surgical History   Procedure  Laterality  Date   .  Knee surgery    x 2   .  Umbilical hernia repair       .  Abdominal aortic aneurysm repair    05/20/11       aorto bifem. stent graft   .  Foot tendon surgery    rt foot   .  Skin surgery           3 different surgeries for melanoma       Family History   Problem  Relation  Age of Onset   .  Cancer  Mother     .  Heart disease  Maternal Aunt        Social History History   Substance Use Topics   .  Smoking status:  Former Smoker -- 2.00 packs/day for 40 years       Types:  Cigarettes       Quit date:  08/31/2005   .  Smokeless tobacco:  Former Neurosurgeon   .  Alcohol Use:  No      No Known Allergies    Current Outpatient Prescriptions   Medication  Sig  Dispense  Refill   .  Ascorbic Acid (VITAMIN C) 1000 MG tablet   Take 1,000 mg by mouth daily.         .  B Complex Vitamins (VITAMIN B COMPLEX PO)  Take by mouth daily.         [name]  b complex vitamins tablet  Take 1 tablet by mouth daily.         [name]  BYSTOLIC 20 MG TABS  Take 20 mg by mouth 2 (two) times daily.         .  diclofenac (VOLTAREN) 75 MG EC tablet  Take 1 tablet by mouth Twice daily.         .  Misc Natural Products (OSTEO BI-FLEX ADV DOUBLE ST PO)  Take by mouth.           [name]  NIFEdipine (PROCARDIA XL/ADALAT-CC) 60 MG 24 hr tablet  Take 1 tablet (60 mg total) by mouth daily.   90 tablet   2   .  omeprazole (PRILOSEC) 20 MG capsule  Take 1 capsule (20 mg total) by mouth 2 (two) times daily. PATIENT NEEDS OFFICE VISIT FOR ADDITIONAL REFILLS   60 capsule   0   .  OVER THE COUNTER MEDICATION  1 tablet daily. Calcium magnesium complex 1000-500mg          .  potassium chloride (K-DUR,KLOR-CON) 10 MEQ tablet  Take 1 tablet by mouth Daily.         .  pravastatin (PRAVACHOL) 40 MG tablet  Take 2 tablets (80 mg  total) by mouth daily.   180 tablet   1   .  sildenafil (VIAGRA) 100 MG tablet  Take 0.5-1 tablets (50-100 mg total) by mouth daily as needed for erectile dysfunction.   5 tablet   4   .  testosterone (ANDROGEL) 50 MG/5GM GEL  2 tubes daily         .  aspirin 81 MG tablet  Take 81 mg by mouth daily.           [name]  pyridOXINE (  VITAMIN B-6) 100 MG tablet  Take 100 mg by mouth daily.          [name]  spironolactone (ALDACTONE) 25 MG tablet  Take 25 mg by mouth daily.               No current facility-administered medications for this visit.      Review of Systems Review of Systems  Constitutional: Negative for fever, chills and unexpected weight change.  HENT: Negative for hearing loss, congestion, sore throat, trouble swallowing and voice change.   Eyes: Negative for visual disturbance.  Respiratory: Negative for cough and wheezing.   Cardiovascular: Negative for chest pain, palpitations and leg swelling.  Gastrointestinal: Negative for nausea,  vomiting, abdominal pain, diarrhea, constipation, blood in stool, abdominal distention, anal bleeding and rectal pain.  Genitourinary: Negative for hematuria and difficulty urinating.  Musculoskeletal: Negative for arthralgias.  Skin: Negative for rash and wound.  Neurological: Negative for seizures, syncope, weakness and headaches.  Hematological: Negative for adenopathy. Does not bruise/bleed easily.  Psychiatric/Behavioral: Negative for confusion.    Blood pressure 138/78, pulse 64, temperature 98.1 F (36.7 C), temperature source Temporal, resp. rate 15, height 5\' 11"  (1.803 m), weight 313 lb (141.976 kg).   Physical Exam Physical Exam  Constitutional: He is oriented to person, place, and time. He appears well-developed and well-nourished.  HENT:   Head: Normocephalic and atraumatic.  Scar on face  Eyes: Pupils are equal, round, and reactive to light. No scleral icterus.  Neck: Normal range of motion. Neck supple.  Cardiovascular: Normal rate and regular rhythm.   Pulmonary/Chest: Effort normal and breath sounds normal.  Musculoskeletal: Normal range of motion.  Neurological: He is alert and oriented to person, place, and time.  Skin:     Multiple skin lesions. These all appear benign. Over his back.    Data Reviewed Dorcas Mcmurray note   Assessment 4 cm mass upper back probable lipoma causing pain   Plan The patient and wife would like to have this excised. The risk and benefits and alternatives were discussed. He has multiple skin lesions on his back but none look suspicious currently to me.The procedure has been discussed with the patient.  Alternative therapies have been discussed with the patient.  Operative risks include bleeding,  Infection,  Organ injury,  Nerve injury,  Blood vessel injury,  DVT,  Pulmonary embolism,  Death,  And possible reoperation.  Medical management risks include worsening of present situation.  The success of the procedure is 50 -90 % at  treating patients symptoms.  The patient understands and agrees to proceed.       Jlyn Cerros A.

## 2013-05-10 NOTE — Interval H&P Note (Signed)
History and Physical Interval Note:  05/10/2013 10:16 AM  William Hebert  has presented today for surgery, with the diagnosis of mass back  The various methods of treatment have been discussed with the patient and family. After consideration of risks, benefits and other options for treatment, the patient has consented to  Procedure(s): EXCISION back MASS (N/A) as a surgical intervention .  The patient's history has been reviewed, patient examined, no change in status, stable for surgery.  I have reviewed the patient's chart and labs.  Questions were answered to the patient's satisfaction.     [name]

## 2013-05-11 ENCOUNTER — Encounter (HOSPITAL_BASED_OUTPATIENT_CLINIC_OR_DEPARTMENT_OTHER): Payer: Self-pay | Admitting: Surgery

## 2013-05-25 ENCOUNTER — Encounter (INDEPENDENT_AMBULATORY_CARE_PROVIDER_SITE_OTHER): Payer: Self-pay | Admitting: Surgery

## 2013-05-25 ENCOUNTER — Ambulatory Visit (INDEPENDENT_AMBULATORY_CARE_PROVIDER_SITE_OTHER): Payer: 59 | Admitting: Surgery

## 2013-05-25 VITALS — BP 136/84 | HR 56 | Resp 20 | Ht 70.0 in | Wt 319.0 lb

## 2013-05-25 DIAGNOSIS — Z9889 Other specified postprocedural states: Secondary | ICD-10-CM

## 2013-05-25 NOTE — Progress Notes (Signed)
Patient returns 2 weeks after excision of large epidermal inclusion cyst which had ruptured from his upper back. He is doing okay. No significant pain. No fever or chills.  Exam: Upper back incision intact. Mild erythema noted. No significant drainage or seroma noted.  Impression: Status post excision large ruptured epidermal inclusion cyst upper back with early cellulitis.  Plan: Doxycycline 100 mg by mouth twice a day for 7 days. May return to work next week. Return to clinic as needed.

## 2013-05-25 NOTE — Patient Instructions (Signed)
Take doxycycline twice a day for 7 days.

## 2013-05-29 ENCOUNTER — Other Ambulatory Visit: Payer: Self-pay | Admitting: Physician Assistant

## 2013-06-15 ENCOUNTER — Telehealth (INDEPENDENT_AMBULATORY_CARE_PROVIDER_SITE_OTHER): Payer: Self-pay

## 2013-06-15 NOTE — Telephone Encounter (Signed)
Wife states patients incision on his back has gotten some what better but there is a small area that still is draining  greenish color fluid denies temp or redness . He has completed his Doxycycline, does Dr. Luisa Hart want to prescribe another round ?

## 2013-06-15 NOTE — Telephone Encounter (Signed)
No. Keep area clean.  Return to check it next week

## 2013-06-15 NOTE — Telephone Encounter (Signed)
Called pt with msg below. Made appt for Monday

## 2013-06-19 ENCOUNTER — Encounter (INDEPENDENT_AMBULATORY_CARE_PROVIDER_SITE_OTHER): Payer: Self-pay | Admitting: Surgery

## 2013-06-19 ENCOUNTER — Ambulatory Visit (INDEPENDENT_AMBULATORY_CARE_PROVIDER_SITE_OTHER): Payer: 59 | Admitting: Surgery

## 2013-06-19 VITALS — BP 140/80 | HR 68 | Temp 98.7°F | Resp 16 | Ht 70.0 in | Wt 320.8 lb

## 2013-06-19 DIAGNOSIS — Z9889 Other specified postprocedural states: Secondary | ICD-10-CM

## 2013-06-19 NOTE — Progress Notes (Signed)
Patient returns for wound check. He is 6 weeks out from excision of large cyst from upper back. There is a small open area that is draining of the weekend he was to check. No fever chills. The drainage is yellow.  Exam: Upper back incision well healed except for a lateral 3 mm opening. Small scab noted. No redness or drainage.  Impression: Probable stitch granuloma resolving  Plan: Keep clean. Keep covered until it dry

## 2013-06-19 NOTE — Patient Instructions (Signed)
Return if area does not dry up in 2 weeks.

## 2013-06-28 IMAGING — NM NM PULMONARY VENT & PERF
2 series · 12 of 12 positions shown · non-contrast
Comparison: Outside chest radiographs dated 04/06/2011.

CLINICAL DATA: Intermittent shortness of breath x1 month, evaluate
for PE

NM PULMONARY VENTILATION AND PERFUSION SCAN
Radiopharmaceutical: 10 mCi xenon 133.  6 mCi technetium 99m MAA.

[vq scan · 2.52mm/px · 6 of 20 frames shown (1 of 2)]
[frame 2/20  full-range]
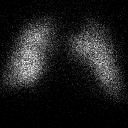
[frame 5/20  full-range]
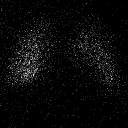
[frame 9/20]
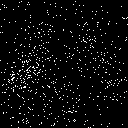
[frame 12/20]
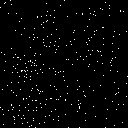
[frame 15/20]
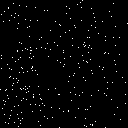
[frame 19/20]
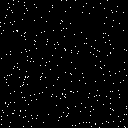

[vq scan · 2.52mm/px · 6 of 20 frames shown (2 of 2)]
[frame 2/20  full-range]
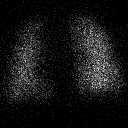
[frame 5/20]
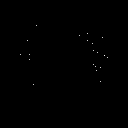
[frame 9/20]
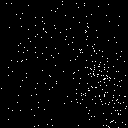
[frame 12/20]
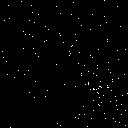
[frame 15/20]
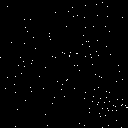
[frame 19/20]
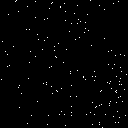

[12 of 12 positions shown; findings below may reference images not displayed]

FINDINGS: Normal ventilation and perfusion.

No perfusion defects worrisome for pulmonary embolism.

On the provided outside hospital radiographs, the lungs are clear.
IMPRESSION: Normal VQ scan.  Very low probability for PE.

## 2013-07-03 ENCOUNTER — Encounter: Payer: Self-pay | Admitting: Vascular Surgery

## 2013-07-04 ENCOUNTER — Encounter: Payer: Self-pay | Admitting: Vascular Surgery

## 2013-07-04 ENCOUNTER — Ambulatory Visit (INDEPENDENT_AMBULATORY_CARE_PROVIDER_SITE_OTHER): Payer: 59 | Admitting: Vascular Surgery

## 2013-07-04 ENCOUNTER — Other Ambulatory Visit: Payer: Self-pay | Admitting: Vascular Surgery

## 2013-07-04 ENCOUNTER — Ambulatory Visit
Admission: RE | Admit: 2013-07-04 | Discharge: 2013-07-04 | Disposition: A | Discharge status: Home / Self Care | Payer: 59 | Source: Ambulatory Visit | Attending: Vascular Surgery | Admitting: Vascular Surgery

## 2013-07-04 VITALS — BP 138/81 | HR 69 | Resp 18 | Ht 71.0 in | Wt 320.0 lb

## 2013-07-04 DIAGNOSIS — Z48812 Encounter for surgical aftercare following surgery on the circulatory system: Secondary | ICD-10-CM

## 2013-07-04 DIAGNOSIS — I714 Abdominal aortic aneurysm, without rupture: Secondary | ICD-10-CM

## 2013-07-04 DIAGNOSIS — Z8679 Personal history of other diseases of the circulatory system: Secondary | ICD-10-CM | POA: Insufficient documentation

## 2013-07-04 DIAGNOSIS — Z9889 Other specified postprocedural states: Secondary | ICD-10-CM

## 2013-07-04 LAB — CREATININE, SERUM: Creat: 1.1 mg/dL (ref 0.50–1.35)

## 2013-07-04 LAB — BUN: BUN: 16 mg/dL (ref 6–23)

## 2013-07-04 MED ORDER — IOHEXOL 350 MG/ML SOLN
125.0000 mL | Freq: Once | INTRAVENOUS | Status: AC | PRN
  Administered 2013-07-04: 125 mL via INTRAVENOUS

## 2013-07-04 NOTE — Progress Notes (Signed)
Subjective:     Patient ID: William Hebert, male   DOB: 1945-12-17, 67 y.o.   MRN: [id number]  HPI this 67 year old male returns to years post endovascular repair of abdominal aortic aneurysm with aortobifemoral iliac graft-Gore -Excluder-C3. Patient denies any abdominal or back symptoms at this time. Aneurysm was 7.4 cm prior to insertion of the endograft. He does have chronic swelling in both legs which has been resistant to elevation and compression stockings. We have evaluated him in the past and he has been found to have some reflux in the deep system particularly on the right.  Past Medical History  Diagnosis Date  . HTN (hypertension)   . GERD (gastroesophageal reflux disease)   . Testosterone deficiency   . Hyperlipidemia   . Morbidly obese   . Headache(784.0)   . Double vision   . Shortness of breath   . Arthritis   . Cancer     History  Substance Use Topics  . Smoking status: Former Smoker -- 2.00 packs/day for 40 years    Types: Cigarettes    Quit date: 08/31/2005  . Smokeless tobacco: Former Neurosurgeon  . Alcohol Use: No    Family History  Problem Relation Age of Onset  . Cancer Mother   . Heart disease Maternal Aunt     No Active Allergies  Current outpatient prescriptions:Ascorbic Acid (VITAMIN C) 1000 MG tablet, Take 1,000 mg by mouth daily., Disp: , Rfl: ;  B Complex Vitamins (VITAMIN B COMPLEX PO), Take by mouth daily., Disp: , Rfl: ;  b complex vitamins tablet, Take 1 tablet by mouth daily., Disp: , Rfl: ;  BYSTOLIC 20 MG TABS, Take 20 mg by mouth 2 (two) times daily., Disp: , Rfl: ;  diclofenac (VOLTAREN) 75 MG EC tablet, Take 1 tablet by mouth Twice daily., Disp: , Rfl:  Misc Natural Products (OSTEO BI-FLEX ADV DOUBLE ST PO), Take by mouth.  , Disp: , Rfl: ;  NIFEdipine (PROCARDIA XL/ADALAT-CC) 60 MG 24 hr tablet, Take 1 tablet (60 mg total) by mouth daily., Disp: 90 tablet, Rfl: 2;  omeprazole (PRILOSEC) 20 MG capsule, Take 1 capsule by mouth  twice a day, Disp: 60  capsule, Rfl: 1;  pravastatin (PRAVACHOL) 40 MG tablet, Take 1 tablet (40 mg total) by mouth daily., Disp: 60 tablet, Rfl: 0 pyridOXINE (VITAMIN B-6) 100 MG tablet, Take 100 mg by mouth daily. , Disp: , Rfl: ;  sildenafil (VIAGRA) 100 MG tablet, Take 0.5-1 tablets (50-100 mg total) by mouth daily as needed for erectile dysfunction., Disp: 5 tablet, Rfl: 4;  testosterone (ANDROGEL) 50 MG/5GM GEL, 2 tubes daily, Disp: , Rfl: ;  aspirin 81 MG tablet, Take 81 mg by mouth daily.  , Disp: , Rfl:  OVER THE COUNTER MEDICATION, 1 tablet daily. Calcium magnesium complex 1000-500mg , Disp: , Rfl: ;  spironolactone (ALDACTONE) 25 MG tablet, Take 25 mg by mouth daily.  , Disp: , Rfl:   BP 138/81  Pulse 69  Resp 18  Ht 5\' 11"  (1.803 m)  Wt 320 lb (145.151 kg)  BMI 44.65 kg/m2  Body mass index is 44.65 kg/(m^2).           Review of Systems denies chest pain but does have mild dyspnea on exertion. He does complain of productive cough and bilateral lower extremity edema. Has had multiple surgical procedures by Dr. Avis Epley systems negative and complete review of systems     Objective:   Physical Exam BP 138/81  Pulse 69  Resp  18  Ht 5\' 11"  (1.803 m)  Wt 320 lb (145.151 kg)  BMI 44.65 kg/m2   General well-developed well-nourished male no apparent distress-morbidly obese HEENT normal for age Lungs no rhonchi or wheezing Cardiovascular regular rhythm no murmurs carotid pulses 3+ palpable no bruits audible Abdomen soft nontender no palpable masses-obese no pulsatile mass Musculoskeletal free of  major deformities Skin clear -no rashes Neurologic normal Lower extremities 3+ femoral and dorsalis pedis pulses palpable bilaterally with 1-2+ edema bilaterally right worse than left  Today I ordered CT angiogram of the abdomen and pelvis reviewed by computer. Aneurysm continues to be about 70 mm in diameter. There is a very scant type II endoleak but the graft is in excellent position with no  abnormalities noted.       Assessment:     2 years post endovascular stent graft for abdominal aortic aneurysm with small type II endoleak-stable and stable diameter of aneurysm sac    Plan:     Return in one year for CT angiogram of abdomen and pelvis for continued followup

## 2013-07-21 ENCOUNTER — Other Ambulatory Visit: Payer: Self-pay | Admitting: Physician Assistant

## 2013-08-15 ENCOUNTER — Telehealth: Payer: Self-pay | Admitting: *Deleted

## 2013-08-15 ENCOUNTER — Ambulatory Visit (INDEPENDENT_AMBULATORY_CARE_PROVIDER_SITE_OTHER): Payer: 59 | Admitting: Emergency Medicine

## 2013-08-15 ENCOUNTER — Encounter: Payer: Self-pay | Admitting: Emergency Medicine

## 2013-08-15 VITALS — BP 150/90 | HR 55 | Temp 97.9°F | Resp 16 | Ht 71.0 in | Wt 320.0 lb

## 2013-08-15 DIAGNOSIS — G4733 Obstructive sleep apnea (adult) (pediatric): Secondary | ICD-10-CM

## 2013-08-15 DIAGNOSIS — I714 Abdominal aortic aneurysm, without rupture: Secondary | ICD-10-CM

## 2013-08-15 DIAGNOSIS — I10 Essential (primary) hypertension: Secondary | ICD-10-CM

## 2013-08-15 DIAGNOSIS — E291 Testicular hypofunction: Secondary | ICD-10-CM

## 2013-08-15 DIAGNOSIS — E782 Mixed hyperlipidemia: Secondary | ICD-10-CM

## 2013-08-15 LAB — CBC WITH DIFFERENTIAL/PLATELET
Basophils Absolute: 0 10*3/uL (ref 0.0–0.1)
Basophils Relative: 1 % (ref 0–1)
Eosinophils Absolute: 0.1 10*3/uL (ref 0.0–0.7)
Eosinophils Relative: 2 % (ref 0–5)
HCT: 42.8 % (ref 39.0–52.0)
Hemoglobin: 14.8 g/dL (ref 13.0–17.0)
Lymphocytes Relative: 20 % (ref 12–46)
Lymphs Abs: 1.4 10*3/uL (ref 0.7–4.0)
MCH: 30.6 pg (ref 26.0–34.0)
MCHC: 34.6 g/dL (ref 30.0–36.0)
MCV: 88.4 fL (ref 78.0–100.0)
Monocytes Absolute: 0.8 10*3/uL (ref 0.1–1.0)
Monocytes Relative: 11 % (ref 3–12)
Neutro Abs: 4.5 10*3/uL (ref 1.7–7.7)
Neutrophils Relative %: 66 % (ref 43–77)
Platelets: 182 10*3/uL (ref 150–400)
RBC: 4.84 MIL/uL (ref 4.22–5.81)
RDW: 14.3 % (ref 11.5–15.5)
WBC: 6.8 10*3/uL (ref 4.0–10.5)

## 2013-08-15 LAB — LIPID PANEL
Cholesterol: 161 mg/dL (ref 0–200)
HDL: 30 mg/dL — ABNORMAL LOW (ref >39)
LDL Cholesterol: 72 mg/dL (ref 0–99)
Total CHOL/HDL Ratio: 5.4 Ratio
Triglycerides: 296 mg/dL — ABNORMAL HIGH (ref <150)
VLDL: 59 mg/dL — ABNORMAL HIGH (ref 0–40)

## 2013-08-15 LAB — COMPLETE METABOLIC PANEL WITH GFR
ALT: 22 U/L (ref 0–53)
AST: 19 U/L (ref 0–37)
Albumin: 3.9 g/dL (ref 3.5–5.2)
Alkaline Phosphatase: 91 U/L (ref 39–117)
BUN: 18 mg/dL (ref 6–23)
CO2: 28 mEq/L (ref 19–32)
Calcium: 9 mg/dL (ref 8.4–10.5)
Chloride: 104 mEq/L (ref 96–112)
Creat: 1.19 mg/dL (ref 0.50–1.35)
GFR, Est African American: 74 mL/min
GFR, Est Non African American: 64 mL/min
Glucose, Bld: 85 mg/dL (ref 70–99)
Potassium: 3.6 mEq/L (ref 3.5–5.3)
Sodium: 139 mEq/L (ref 135–145)
Total Bilirubin: 0.4 mg/dL (ref 0.3–1.2)
Total Protein: 6.1 g/dL (ref 6.0–8.3)

## 2013-08-15 MED ORDER — PRAVASTATIN SODIUM 40 MG PO TABS: ORAL_TABLET | ORAL | Status: DC

## 2013-08-15 MED ORDER — NEBIVOLOL HCL 20 MG PO TABS: 20.0000 mg | ORAL_TABLET | Freq: Two times a day (BID) | ORAL | Status: DC

## 2013-08-15 MED ORDER — TESTOSTERONE 50 MG/5GM (1%) TD GEL: 5.0000 g | Freq: Every day | TRANSDERMAL | Status: DC

## 2013-08-15 MED ORDER — OMEPRAZOLE 20 MG PO CPDR: DELAYED_RELEASE_CAPSULE | ORAL | Status: DC

## 2013-08-15 MED ORDER — NIFEDIPINE ER OSMOTIC RELEASE 60 MG PO TB24: 60.0000 mg | ORAL_TABLET | Freq: Every day | ORAL | Status: DC

## 2013-08-15 NOTE — Progress Notes (Signed)
   Subjective:    Patient ID: William Hebert, male    DOB: 1946-02-21, 67 y.o.   MRN: [id number]  HPI patient in for blood work to get his medications refilled. He overall is doing well and is planning to retire 7. His weight continues to be an issue. He is had multiple surgeries this year. He overall is feeling well. He has sleep apnea and uses his CPAP machine. He still has difficulty with sleeping. He currently is on testosterone replacement but only uses one packet rather than 2 packets a day.    Review of Systems     Objective:   Physical Exam patient is alert and cooperative in no distress. His weight today was 320 pounds. His chest is clear his heart has a regular rate without murmurs his abdomen is protuberant without masses his extremities have 1+ edema but no calf tenderness        Assessment & Plan:  Hopefully once he retires he can start exercising and do better with his diet and exercise. We did refill his medications but I decreased his testosterone to 1 pack a day because of the risks of clot.

## 2013-08-15 NOTE — Telephone Encounter (Signed)
Faxed Rx for testosterone to [facility].

## 2013-08-16 ENCOUNTER — Other Ambulatory Visit: Payer: Self-pay | Admitting: *Deleted

## 2013-08-16 DIAGNOSIS — E291 Testicular hypofunction: Secondary | ICD-10-CM

## 2013-08-16 NOTE — Telephone Encounter (Signed)
Okay to add a free and total testosterone to his blood work

## 2013-08-17 LAB — PSA: PSA: 0.68 ng/mL (ref <=4.00)

## 2013-08-17 LAB — TESTOSTERONE, FREE, TOTAL, SHBG
Sex Hormone Binding: 31 nmol/L (ref 13–71)
Testosterone, Free: 35.4 pg/mL — ABNORMAL LOW (ref 47.0–244.0)
Testosterone-% Free: 2 % (ref 1.6–2.9)
Testosterone: 181 ng/dL — ABNORMAL LOW (ref 300–890)

## 2013-08-17 MED ORDER — TESTOSTERONE 50 MG/5GM (1%) TD GEL: 5.0000 g | Freq: Every day | TRANSDERMAL | Status: DC

## 2013-08-17 MED ORDER — OMEPRAZOLE 20 MG PO CPDR: DELAYED_RELEASE_CAPSULE | ORAL | Status: DC

## 2013-08-17 NOTE — Telephone Encounter (Signed)
[phone]   Patient states the stomach doctor wants a different way of taking the meds and Dr. Cleta Alberts needs to redo them.   Please call Darlene at [phone]

## 2013-08-17 NOTE — Telephone Encounter (Signed)
Spoke to [employer]. Labetalol 200 mg was given by Dr Jacinto Halim, since the Bystolic is on back order Testosterone was not received by the mail order (please reprint and fax) pended She wants to know if we can also change sig on the Omprazole to bid pended Will you clarify the dose on the Pravastatin, Darlene states he was taking bid. What should he be using?

## 2013-08-18 ENCOUNTER — Telehealth: Payer: Self-pay

## 2013-08-18 ENCOUNTER — Other Ambulatory Visit: Payer: Self-pay | Admitting: Radiology

## 2013-08-18 DIAGNOSIS — E291 Testicular hypofunction: Secondary | ICD-10-CM

## 2013-08-18 MED ORDER — TESTOSTERONE 50 MG/5GM (1%) TD GEL: 5.0000 g | Freq: Every day | TRANSDERMAL | Status: DC

## 2013-08-18 NOTE — Telephone Encounter (Signed)
Fax from [facility] asking for clarification on Androgel Rx. Dr Cleta Alberts clarified and I changed in EPIC and faxed back.

## 2013-08-19 ENCOUNTER — Telehealth: Payer: Self-pay

## 2013-08-19 NOTE — Telephone Encounter (Signed)
We talked about the testosterone and due to the side effects we decided he would only use one pack a day even though the level is low. There is significant risk to being on testosterone with regard to venous clots. If we keep him on a lower dose that would be better. I need to see him in 4 month

## 2013-08-19 NOTE — Telephone Encounter (Signed)
Dr. Cleta Alberts, spoke with Agustin Cree about lab results. She's concerned about William Hebert's testosterone. Wants to know if you still only want him to do one pump a day? Also wants to know when you want to recheck him. She said at the visit you said one year, but felt certain you'd want to see him sooner. Thanks

## 2013-08-21 NOTE — Telephone Encounter (Signed)
Wife advised. 

## 2013-08-28 ENCOUNTER — Telehealth: Payer: Self-pay

## 2013-08-28 NOTE — Telephone Encounter (Signed)
Patient's wife states that the wrong RX has been given for her husband. States that he normally uses the Testim cream and NOT the Androgel. She states that their insurance does not cover the Androgel and they cannot afford to pay out of pocket. States that the Testim cream has a $7.00 copay.   [phone]

## 2013-08-29 NOTE — Telephone Encounter (Signed)
Send this to Dr. Cleta Alberts

## 2013-08-29 NOTE — Telephone Encounter (Signed)
It looks like you discussed this with mail order and Dr Cleta Alberts earlier in the month.

## 2013-08-30 NOTE — Telephone Encounter (Signed)
Oops. I was trying to send to [facility], she has been helping patient with this.

## 2013-08-30 NOTE — Telephone Encounter (Signed)
Dr Cleta Alberts, we had clarified dose of Androgel w/[facility], but according to wife, the testosterone needs to be written for Testim instead. Can we send in a Rx for correct dose of Testim for pt? I have pended it, but please check that dose is what you want him on.

## 2013-08-31 MED ORDER — TESTOSTERONE 50 MG/5GM (1%) TD GEL: 5.0000 g | Freq: Every day | TRANSDERMAL | Status: DC

## 2013-08-31 NOTE — Telephone Encounter (Signed)
Notified wife faxed in.

## 2013-10-19 ENCOUNTER — Other Ambulatory Visit: Payer: Self-pay | Admitting: Emergency Medicine

## 2013-10-20 ENCOUNTER — Telehealth: Payer: Self-pay

## 2013-10-20 NOTE — Telephone Encounter (Signed)
Patients wife calling to let us know that the pharmacy is faxing over rx for his blood pressure medication, she said that dr Milderd Meager office will not fill it, and was originally prescribed by dr Everlene Farrier. He is coming in next month to see dr Everlene Farrier and needs this refilled if possible  [phone]

## 2013-10-23 NOTE — Telephone Encounter (Signed)
Darlene stated that Dr Einar Gip switched pt off of bystolic and put him on labetalol 200 mg QD. Pt is still taking the nifedipine (wants the generic, not name brand). She wanted me to update his med list which I am doing. Advised her that [facility] should have nifedipine Rx on file for pt for whole year. She will contact [facility] and call me back if it needs to be resent.

## 2013-10-26 ENCOUNTER — Other Ambulatory Visit: Payer: Self-pay

## 2013-10-26 MED ORDER — NIFEDIPINE ER OSMOTIC RELEASE 60 MG PO TB24: 60.0000 mg | ORAL_TABLET | Freq: Every day | ORAL | Status: DC

## 2013-11-02 ENCOUNTER — Telehealth: Payer: Self-pay

## 2013-11-02 MED ORDER — OSELTAMIVIR PHOSPHATE 75 MG PO CAPS: 75.0000 mg | ORAL_CAPSULE | Freq: Every day | ORAL | Status: DC

## 2013-11-02 NOTE — Telephone Encounter (Signed)
Tamiflu sent in to pharm for pt

## 2014-03-06 ENCOUNTER — Other Ambulatory Visit: Payer: Self-pay | Admitting: Emergency Medicine

## 2014-03-06 ENCOUNTER — Encounter: Payer: Self-pay | Admitting: Emergency Medicine

## 2014-03-06 ENCOUNTER — Ambulatory Visit (INDEPENDENT_AMBULATORY_CARE_PROVIDER_SITE_OTHER): Payer: 59 | Admitting: Emergency Medicine

## 2014-03-06 VITALS — BP 164/84 | HR 69 | Temp 98.3°F | Resp 16 | Ht 72.5 in | Wt 323.8 lb

## 2014-03-06 DIAGNOSIS — I10 Essential (primary) hypertension: Secondary | ICD-10-CM

## 2014-03-06 DIAGNOSIS — Z0000 Encounter for general adult medical examination without abnormal findings: Secondary | ICD-10-CM

## 2014-03-06 DIAGNOSIS — E782 Mixed hyperlipidemia: Secondary | ICD-10-CM

## 2014-03-06 DIAGNOSIS — Z23 Encounter for immunization: Secondary | ICD-10-CM

## 2014-03-06 DIAGNOSIS — E291 Testicular hypofunction: Secondary | ICD-10-CM | POA: Insufficient documentation

## 2014-03-06 DIAGNOSIS — Z139 Encounter for screening, unspecified: Secondary | ICD-10-CM

## 2014-03-06 DIAGNOSIS — N529 Male erectile dysfunction, unspecified: Secondary | ICD-10-CM

## 2014-03-06 LAB — POCT URINALYSIS DIPSTICK
Bilirubin, UA: NEGATIVE
Glucose, UA: NEGATIVE
Ketones, UA: NEGATIVE
Leukocytes, UA: NEGATIVE
Nitrite, UA: NEGATIVE
Protein, UA: 100
Spec Grav, UA: 1.02
Urobilinogen, UA: 0.2
pH, UA: 7

## 2014-03-06 LAB — IFOBT (OCCULT BLOOD): IFOBT: NEGATIVE

## 2014-03-06 LAB — POCT GLYCOSYLATED HEMOGLOBIN (HGB A1C): Hemoglobin A1C: 5.8

## 2014-03-06 NOTE — Progress Notes (Deleted)
   Subjective:    Patient ID: William Hebert, male    DOB: 12-May-1946, 68 y.o.   MRN: [id number]  HPI  Doing good.  Still working part time.  Does not like working.  He works 5 hours every Friday night.  They want him to have a knee replacement but he refuses.  Patient wants a motorized scooter.  He is treating his sleep apnea.  He does not take pravastatin, asa, all of his vitamins, andro gel,     Review of Systems     Objective:   Physical Exam        Assessment & Plan:

## 2014-03-06 NOTE — Progress Notes (Signed)
@[email or web address] @  Patient ID: William Hebert MRN: [id number], DOB: [birth year] 68 y.o. Date of Encounter: 03/06/2014, 5:40 PM  Primary Physician: Jenny Reichmann, MD  Chief Complaint: Physical (CPE)  HPI: 68 y.o. y/o male with history noted below here for CPE.  Doing well. No issues/complaints.  Review of Systems:  Consitutional: No fever, chills, fatigue, night sweats, lymphadenopathy, or weight changes. Eyes: No visual changes, eye redness, or discharge. ENT/Mouth: Ears: No otalgia, tinnitus, hearing loss, discharge. Nose: No congestion, rhinorrhea, sinus pain, or epistaxis. Throat: No sore throat, post nasal drip, or teeth pain. Cardiovascular: No CP, palpitations, diaphoresis, DOE, edema, orthopnea, PND. Patient is status post aneurysm repair. He does not receive regular cardiology followup. He does have a history of obstructive sleep apnea and states he is compliant. Respiratory: No cough, hemoptysis, SOB, or wheezing. Gastrointestinal: No anorexia, dysphagia, reflux, pain, nausea, vomiting, hematemesis, diarrhea, constipation, BRBPR, or melena. Genitourinary: No dysuria, frequency, urgency, hematuria, incontinence, nocturia, decreased urinary stream, discharge, , or testicular pain/masses. He is currently off of testosterone replacement. He has essentially no libido and is impotent. Musculoskeletal: He has severe pain in both knees and marked difficulty with ambulation. He has undergone injections in the right knee without improvement. Skin: No rash, erythema, lesion changes, pain, warmth, jaundice, or pruritis. Neurological: No headache, dizziness, syncope, seizures, tremors, memory loss, coordination problems, or paresthesias. Psychological: No anxiety, depression, hallucinations, SI/HI. He does get upset and does not want to return to work. He works 5 hours a week because his wife has encouraged him to do so Endocrine: No fatigue, polydipsia, polyphagia, polyuria, or known diabetes. All other  systems were reviewed and are otherwise negative.  Past Medical History  Diagnosis Date  . HTN (hypertension)   . GERD (gastroesophageal reflux disease)   . Testosterone deficiency   . Hyperlipidemia   . Morbidly obese   . Headache(784.0)   . Double vision   . Shortness of breath   . Arthritis   . Cancer      Past Surgical History  Procedure Laterality Date  . Knee surgery  x 2  . Umbilical hernia repair    . Abdominal aortic aneurysm repair  05/20/11    aorto bifem. stent graft  . Foot tendon surgery  rt foot  . Skin surgery      3 different surgeries for melanoma  . Cataract extraction    . Mass excision N/A 05/10/2013    Procedure: EXCISION back MASS;  Surgeon: Joyice Faster. Cornett, MD;  Location: [place];  Service: General;  Laterality: N/A;  . Elbow surgery      Home Meds:  Prior to Admission medications   Medication Sig Start Date End Date Taking? Authorizing Provider  diclofenac (VOLTAREN) 75 MG EC tablet Take 1 tablet by mouth Twice daily. 03/18/11  Yes Historical Provider, MD  labetalol (NORMODYNE) 200 MG tablet Take 200 mg by mouth daily.   Yes Historical Provider, MD  Misc Natural Products (OSTEO BI-FLEX ADV DOUBLE ST PO) Take by mouth.     Yes Historical Provider, MD  Nebivolol HCl (BYSTOLIC) 20 MG TABS Take 1 tablet (20 mg total) by mouth 2 (two) times daily. 08/15/13  Yes Darlyne Russian, MD  NIFEdipine (PROCARDIA XL/ADALAT-CC) 60 MG 24 hr tablet Take 1 tablet (60 mg total) by mouth daily. 10/26/13  Yes Darlyne Russian, MD  omeprazole (PRILOSEC) 20 MG capsule Take one tablet bid 08/17/13  Yes Darlyne Russian, MD  Ascorbic Acid (VITAMIN C)  1000 MG tablet Take 1,000 mg by mouth daily.    Historical Provider, MD  aspirin 81 MG tablet Take 81 mg by mouth daily.      Historical Provider, MD  B Complex Vitamins (VITAMIN B COMPLEX PO) Take by mouth daily.    Historical Provider, MD  b complex vitamins tablet Take 1 tablet by mouth daily.    Historical Provider,  MD  oseltamivir (TAMIFLU) 75 MG capsule Take 1 capsule (75 mg total) by mouth daily. 11/02/13   Darlyne Russian, MD  OVER THE COUNTER MEDICATION 1 tablet daily. Calcium magnesium complex 1000-500mg     Historical Provider, MD  pravastatin (PRAVACHOL) 40 MG tablet Take one tablet daily 08/15/13   Darlyne Russian, MD  pyridOXINE (VITAMIN B-6) 100 MG tablet Take 100 mg by mouth daily.     Historical Provider, MD  spironolactone (ALDACTONE) 25 MG tablet Take 25 mg by mouth daily.      Historical Provider, MD  testosterone (TESTIM) 50 MG/5GM GEL Place 5 g onto the skin daily. 08/30/13   Darlyne Russian, MD  VIAGRA 100 MG tablet TAKE 0.5-1 TABLETS (50-100 MG TOTAL) BY MOUTH DAILY AS NEEDED FOR ERECTILE DYSFUNCTION.    Darlyne Russian, MD    Allergies: No Active Allergies  History   Social History  . Marital Status: Married    Spouse Name: N/A    Number of Children: N/A  . Years of Education: N/A   Occupational History  . 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     Social History Main Topics  . Smoking status: Former Smoker -- 2.00 packs/day for 40 years    Types: Cigarettes    Quit date: 08/31/2005  . Smokeless tobacco: Former Systems developer  . Alcohol Use: No  . Drug Use: No  . Sexual Activity: Not on file   Other Topics Concern  . Not on file   Social History Narrative  . No narrative on file    Family History  Problem Relation Age of Onset  . Cancer Mother   . Heart disease Maternal Aunt     Physical Exam: Blood pressure 164/84, pulse 69, temperature 98.3 F (36.8 C), temperature source Oral, resp. rate 16, height 6' 0.5" (1.842 m), weight 323 lb 12.8 oz (146.875 kg), SpO2 94.00%.  General: Well developed, well nourished, in no acute distress. HEENT: Normocephalic, atraumatic. Conjunctiva pink, sclera non-icteric. Pupils 2 mm constricting to 1 mm, round, regular, and equally reactive to light and accomodation. EOMI. Internal auditory canal clear. TMs with good cone of light and without pathology. Nasal mucosa pink.  Nares are without discharge. No sinus tenderness. Oral mucosa pink. Dentition. Pharynx without exudate.   Neck: Supple. Trachea midline. No thyromegaly. Full ROM. No lymphadenopathy. Lungs: Clear to auscultation bilaterally without wheezes, rales, or rhonchi. Breathing is of normal effort and unlabored. Cardiovascular: RRR with S1 S2. No murmurs, rubs, or gallops appreciated. Distal pulses 2+ symmetrically. No carotid or abdominal bruits. Abdomen: Soft, non-tender, non-distended with normoactive bowel sounds. No hepatosplenomegaly or masses. No rebound/guarding. No CVA tenderness. Without hernias. His abdomen is protuberant Rectal: No external hemorrhoids or fissures. Rectal vault without masses.   Genitourinary:   circumcised male. No penile lesions. Testes descended bilaterally, and smooth without tenderness or masses.  Musculoskeletal: Full range of motion and 5/5 strength throughout. Without swelling, atrophy, tenderness, crepitus, or warmth. Extremities without clubbing, cyanosis, or edema. Calves supple. Skin: Warm and moist without erythema, ecchymosis, wounds, or rash. Neuro: A+Ox3. CN II-XII grossly intact. Moves all  extremities spontaneously. Full sensation throughout. Normal gait. DTR 2+ throughout upper and lower extremities. Finger to nose intact. Psych:  Responds to questions appropriately with a normal affect.  EKG borderline right bundle branch block. Results for orders placed in visit on 03/06/14  IFOBT (OCCULT BLOOD)      Result Value Ref Range   IFOBT Negative    POCT GLYCOSYLATED HEMOGLOBIN (HGB A1C)      Result Value Ref Range   Hemoglobin A1C 5.8    POCT URINALYSIS DIPSTICK      Result Value Ref Range   Color, UA yellow     Clarity, UA clear     Glucose, UA neg     Bilirubin, UA neg     Ketones, UA neg     Spec Grav, UA 1.020     Blood, UA trace     pH, UA 7.0     Protein, UA 100     Urobilinogen, UA 0.2     Nitrite, UA neg     Leukocytes, UA Negative      Assessment/Plan:  68 y.o. y/o   male here for CPE. He is incredibly out of shape. 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 This severely limits his ability to walk. They are looking into the possibility of a scooter to help with his mobility which I think is reasonable. He is incredibly overweight but only able to exercise one to 2 days a week in the pool because he does not like to exercise. There significant issues with libido and impotence . He is currently off of testosterone replacement. There are concerns about his high risk for clotting. He needs to make some major lifestyle changes but I'd honestly do not believe he is interested in doing that. Will get opinion from Dr. Loanne Drilling regarding help with his decreased libido and impotence. He was given a tetanus shot and Prevnar today. -  Signed, [name], MD 03/06/2014 5:40 PM

## 2014-03-07 ENCOUNTER — Encounter: Payer: Self-pay | Admitting: Emergency Medicine

## 2014-03-07 LAB — COMPLETE METABOLIC PANEL WITH GFR
ALT: 24 U/L (ref 0–53)
AST: 20 U/L (ref 0–37)
Albumin: 4.2 g/dL (ref 3.5–5.2)
Alkaline Phosphatase: 94 U/L (ref 39–117)
BUN: 12 mg/dL (ref 6–23)
CO2: 26 mEq/L (ref 19–32)
Calcium: 9.3 mg/dL (ref 8.4–10.5)
Chloride: 103 mEq/L (ref 96–112)
Creat: 0.92 mg/dL (ref 0.50–1.35)
GFR, Est African American: >89 mL/min
GFR, Est Non African American: 86 mL/min
Glucose, Bld: 93 mg/dL (ref 70–99)
Potassium: 3.4 mEq/L — ABNORMAL LOW (ref 3.5–5.3)
Sodium: 140 mEq/L (ref 135–145)
Total Bilirubin: 0.6 mg/dL (ref 0.2–1.2)
Total Protein: 6.4 g/dL (ref 6.0–8.3)

## 2014-03-07 LAB — CBC WITH DIFFERENTIAL/PLATELET
Basophils Absolute: 0.1 10*3/uL (ref 0.0–0.1)
Basophils Relative: 1 % (ref 0–1)
Eosinophils Absolute: 0.1 10*3/uL (ref 0.0–0.7)
Eosinophils Relative: 2 % (ref 0–5)
HCT: 43.6 % (ref 39.0–52.0)
Hemoglobin: 15.4 g/dL (ref 13.0–17.0)
Lymphocytes Relative: 21 % (ref 12–46)
Lymphs Abs: 1.2 10*3/uL (ref 0.7–4.0)
MCH: 30.4 pg (ref 26.0–34.0)
MCHC: 35.3 g/dL (ref 30.0–36.0)
MCV: 86.2 fL (ref 78.0–100.0)
Monocytes Absolute: 0.4 10*3/uL (ref 0.1–1.0)
Monocytes Relative: 7 % (ref 3–12)
Neutro Abs: 3.9 10*3/uL (ref 1.7–7.7)
Neutrophils Relative %: 69 % (ref 43–77)
Platelets: 193 10*3/uL (ref 150–400)
RBC: 5.06 MIL/uL (ref 4.22–5.81)
RDW: 14.3 % (ref 11.5–15.5)
WBC: 5.6 10*3/uL (ref 4.0–10.5)

## 2014-03-07 LAB — LIPID PANEL
Cholesterol: 198 mg/dL (ref 0–200)
HDL: 33 mg/dL — ABNORMAL LOW (ref >39)
LDL Cholesterol: 131 mg/dL — ABNORMAL HIGH (ref 0–99)
Total CHOL/HDL Ratio: 6 Ratio
Triglycerides: 171 mg/dL — ABNORMAL HIGH (ref <150)
VLDL: 34 mg/dL (ref 0–40)

## 2014-03-07 LAB — TSH: TSH: 1.75 u[IU]/mL (ref 0.350–4.500)

## 2014-03-07 LAB — T4, FREE: Free T4: 1.04 ng/dL (ref 0.80–1.80)

## 2014-03-08 LAB — TESTOSTERONE: Testosterone: 320 ng/dL (ref 300–890)

## 2014-03-12 ENCOUNTER — Ambulatory Visit (INDEPENDENT_AMBULATORY_CARE_PROVIDER_SITE_OTHER): Payer: Medicare Other | Admitting: Endocrinology

## 2014-03-12 ENCOUNTER — Encounter: Payer: Self-pay | Admitting: Endocrinology

## 2014-03-12 VITALS — BP 132/72 | HR 65 | Temp 98.1°F | Ht 71.0 in | Wt 329.0 lb

## 2014-03-12 DIAGNOSIS — E291 Testicular hypofunction: Secondary | ICD-10-CM

## 2014-03-12 MED ORDER — SILDENAFIL CITRATE 20 MG PO TABS: ORAL_TABLET | ORAL | Status: DC

## 2014-03-12 NOTE — Patient Instructions (Signed)
i agree with Dr Everlene Farrier that it is too dangerous to take the testim medication. Weight-loss also helps your symptoms.  i have sent a prescription to your pharmacy, for a generic strength of viaigra.   I would be happy to see you back here whenever you want.

## 2014-03-12 NOTE — Progress Notes (Signed)
Subjective:    Patient ID: William Hebert, male    DOB: 1946-04-30, 68 y.o.   MRN: [id number]  HPI Pt reports he had puberty at the normal age.  He has 2 biological children.  He says he has never taken illicit androgens.  He was dx'ed with hypogonadism in approx 2010.  He has been off and on testim since then.  Most recently, he stopped it 1 year ago, due to his risk of VTE.  Since off it, he feels no different.  He denies any h/o infertility.  He has ED sxs, especially when he has knee pain.  viagra helps.   Past Medical History  Diagnosis Date  . HTN (hypertension)   . GERD (gastroesophageal reflux disease)   . Testosterone deficiency   . Hyperlipidemia   . Morbidly obese   . Headache(784.0)   . Double vision   . Shortness of breath   . Arthritis   . Cancer     Past Surgical History  Procedure Laterality Date  . Knee surgery  x 2  . Umbilical hernia repair    . Abdominal aortic aneurysm repair  05/20/11    aorto bifem. stent graft  . Foot tendon surgery  rt foot  . Skin surgery      3 different surgeries for melanoma  . Cataract extraction    . Mass excision N/A 05/10/2013    Procedure: EXCISION back MASS;  Surgeon: Joyice Faster. Cornett, MD;  Location: [place];  Service: General;  Laterality: N/A;  . Elbow surgery      History   Social History  . Marital Status: Married    Spouse Name: N/A    Number of Children: N/A  . Years of Education: N/A   Occupational History  . 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     Social History Main Topics  . Smoking status: Former Smoker -- 2.00 packs/day for 40 years    Types: Cigarettes    Quit date: 08/31/2005  . Smokeless tobacco: Former Systems developer  . Alcohol Use: No  . Drug Use: No  . Sexual Activity: Not on file   Other Topics Concern  . Not on file   Social History Narrative  . No narrative on file    Current Outpatient Prescriptions on File Prior to Visit  Medication Sig Dispense Refill  . Ascorbic Acid (VITAMIN C) 1000 MG  tablet Take 1,000 mg by mouth daily.      [name] aspirin 81 MG tablet Take 81 mg by mouth daily.        . B Complex Vitamins (VITAMIN B COMPLEX PO) Take by mouth daily.      [name] b complex vitamins tablet Take 1 tablet by mouth daily.      . diclofenac (VOLTAREN) 75 MG EC tablet Take 1 tablet by mouth Twice daily.      [name] labetalol (NORMODYNE) 200 MG tablet Take 200 mg by mouth daily.      . Misc Natural Products (OSTEO BI-FLEX ADV DOUBLE ST PO) Take by mouth.        . Nebivolol HCl (BYSTOLIC) 20 MG TABS Take 1 tablet (20 mg total) by mouth 2 (two) times daily.  180 tablet  3  . NIFEdipine (PROCARDIA XL/ADALAT-CC) 60 MG 24 hr tablet Take 1 tablet (60 mg total) by mouth daily.  90 tablet  2  . omeprazole (PRILOSEC) 20 MG capsule Take one tablet bid  180 capsule  3  . oseltamivir (TAMIFLU) 75 MG  capsule Take 1 capsule (75 mg total) by mouth daily.  10 capsule  0  . OVER THE COUNTER MEDICATION 1 tablet daily. Calcium magnesium complex 1000-500mg       . pravastatin (PRAVACHOL) 40 MG tablet Take one tablet daily  90 tablet  3  . pyridOXINE (VITAMIN B-6) 100 MG tablet Take 100 mg by mouth daily.       [name] spironolactone (ALDACTONE) 25 MG tablet Take 25 mg by mouth daily.        [name] testosterone (TESTIM) 50 MG/5GM GEL Place 5 g onto the skin daily.  90 Package  1  . VIAGRA 100 MG tablet TAKE 0.5-1 TABLETS (50-100 MG TOTAL) BY MOUTH DAILY AS NEEDED FOR ERECTILE DYSFUNCTION.  5 tablet  3   No current facility-administered medications on file prior to visit.    No Active Allergies  Family History  Problem Relation Age of Onset  . Cancer Mother   . Heart disease Maternal Aunt   neg for hypogonadism  BP 132/72  Pulse 65  Temp(Src) 98.1 F (36.7 C) (Oral)  Ht 5\' 11"  (1.803 m)  Wt 329 lb (149.233 kg)  BMI 45.91 kg/m2  Review of Systems denies headache, fever, diarrhea, rash, visual loss, abdominal pain, depression, galactorrhea, cramps, excessive diaphoresis, cold intolerance, muscle weakness, n/v, and  numbness.  He has weight gain, urinary frequency, arthralgias, rhinorrhea, easy bruising, and doe.     Objective:   Physical Exam VS: see vs page GEN: no distress.  Morbid obesity.   HEAD: head: no deformity eyes: no periorbital swelling, no proptosis external nose and ears are normal mouth: no lesion seen NECK: supple, thyroid is not enlarged CHEST WALL: no deformity LUNGS: clear to auscultation BREASTS:  No gynecomastia CV: reg rate and rhythm, no murmur ABD: abdomen is soft, nontender.  no hepatosplenomegaly.  not distended.  Large self-reducing ventral hernia. GENITALIA:  Normal male.   MUSCULOSKELETAL: muscle bulk and strength are grossly normal.  no obvious joint swelling.  gait is normal and steady EXTEMITIES: no deformity. 2+ bilat leg edema PULSES: no carotid bruit NEURO:  cn 2-12 grossly intact.   readily moves all 4's.  sensation is intact to touch on all 4's. SKIN:  Normal texture and temperature.  No rash or suspicious lesion is visible.  Normal hair distribution.  NODES:  None palpable at the neck.   PSYCH: alert, well-oriented.  Does not appear anxious nor depressed.    Lab Results  Component Value Date   TESTOSTERONE 320 03/06/2014   i have reviewed the following outside records: Office notes  i reviewed CT report (2012): no mention of pituitary abnormality.    Assessment & Plan:  Hypogonadism, new to me, uncertain etiology, resolved. ED sxs: multifactorial OA: pain can contribute to ED sxs. Morbid obesity.  Moderate exacerbation.    Patient is advised the following: Patient Instructions  i agree with Dr Everlene Farrier that it is too dangerous to take the testim medication. Weight-loss also helps your symptoms.  i have sent a prescription to your pharmacy, for a generic strength of viaigra.   I would be happy to see you back here whenever you want.

## 2014-03-15 ENCOUNTER — Telehealth: Payer: Self-pay | Admitting: Endocrinology

## 2014-03-15 ENCOUNTER — Other Ambulatory Visit: Payer: Self-pay

## 2014-03-15 MED ORDER — SILDENAFIL CITRATE 20 MG PO TABS: ORAL_TABLET | ORAL | Status: DC

## 2014-03-15 NOTE — Telephone Encounter (Signed)
please call patient: i received PA form for sildenafil.  It is unlikely to be approved, and there is a charge for the form itself.  It is better to but it at [facility] or [facility].

## 2014-03-15 NOTE — Telephone Encounter (Signed)
Rx sent to [facility] per pt's request

## 2014-03-19 ENCOUNTER — Telehealth: Payer: Self-pay

## 2014-03-19 NOTE — Telephone Encounter (Signed)
Patient called regarding his handicap plaque. Please return call and advise. CB# [phone]

## 2014-03-19 NOTE — Telephone Encounter (Signed)
Pt states the form has been lost- he did not get any papers at his visit. It is lost.  Filled out a new form and had Dr. Everlene Farrier sign- ready for pick up. Pt notified.

## 2014-03-26 ENCOUNTER — Telehealth: Payer: Self-pay | Admitting: *Deleted

## 2014-03-26 DIAGNOSIS — R0600 Dyspnea, unspecified: Secondary | ICD-10-CM

## 2014-03-26 DIAGNOSIS — M170 Bilateral primary osteoarthritis of knee: Secondary | ICD-10-CM

## 2014-03-26 DIAGNOSIS — M1712 Unilateral primary osteoarthritis, left knee: Secondary | ICD-10-CM

## 2014-03-26 DIAGNOSIS — M1711 Unilateral primary osteoarthritis, right knee: Secondary | ICD-10-CM

## 2014-03-26 NOTE — Telephone Encounter (Signed)
Pt asked if Dr. Everlene Farrier could fill out the Handicap Placard paperwork, instead of just the tag.    Pt was also inquiring about the prescription for the Electric scooter.  Pt # [phone]

## 2014-03-27 DIAGNOSIS — Z6841 Body Mass Index (BMI) 40.0 and over, adult: Secondary | ICD-10-CM | POA: Insufficient documentation

## 2014-03-27 DIAGNOSIS — M170 Bilateral primary osteoarthritis of knee: Secondary | ICD-10-CM | POA: Insufficient documentation

## 2014-03-27 NOTE — Telephone Encounter (Signed)
Placed handicap placard in box for completion. Please advise for electric scooter.

## 2014-03-27 NOTE — Telephone Encounter (Signed)
He will need an evaluation from physical therapist prior to getting him the chair. Physical therapy is booked until September wife is aware/ have put in order and sent copy to them.

## 2014-03-27 NOTE — Telephone Encounter (Signed)
Called patient to let him know we are working on the form

## 2014-03-30 NOTE — Telephone Encounter (Signed)
Placed DMV paperwork in the pick up drawer. LM to notify pt.

## 2014-04-09 ENCOUNTER — Other Ambulatory Visit: Payer: Self-pay

## 2014-04-09 NOTE — Telephone Encounter (Signed)
Dr Everlene Farrier, I received faxed req for RF of furosemide. You just saw pt for CPE in July, but I don't see this med on his med list at [facility] and don't see that you have Rxd it in the past. Do you need to see pt back to Rx this? I contacted wife who reported that this was orig Rxd by Dr Einar Gip for pt's leg edema, and has released pt into Dr Perfecto Kingdom care. His Rx is for 40 mg, 1 tab daily, but Dr Einar Gip had pt reduce to 1/2 tab daily. Darlene requests the Rx to be left as 1 daily in case more is needed and to have it last longer. I have pended Rx as it has been written.

## 2014-04-10 MED ORDER — FUROSEMIDE 40 MG PO TABS: 40.0000 mg | ORAL_TABLET | Freq: Every day | ORAL | Status: DC

## 2014-04-20 ENCOUNTER — Telehealth: Payer: Self-pay

## 2014-04-20 NOTE — Telephone Encounter (Signed)
Betsy from [facility] sent a message to me that they can not supply scooters to [facility] pts, and she suggested we send order to [facility]. Reprinted Dr Perfecto Kingdom orders and faxed w/demos and ins info to [facility].

## 2014-05-08 ENCOUNTER — Telehealth: Payer: Self-pay

## 2014-05-08 NOTE — Telephone Encounter (Signed)
Patients wife called today regarding a scooter. She says she still has not heard anything from anybody and they are going on vacation.

## 2014-05-09 NOTE — Telephone Encounter (Signed)
LM for pt wife- faxed order to [employer] on 8/21 per previous phone message. Gave information to contact them and our call back number for further questions.

## 2014-05-18 ENCOUNTER — Other Ambulatory Visit: Payer: Self-pay | Admitting: *Deleted

## 2014-05-18 NOTE — Telephone Encounter (Signed)
Received fax request for refill on [facility]. Pt has not had refill since 2012.

## 2014-05-29 ENCOUNTER — Ambulatory Visit (INDEPENDENT_AMBULATORY_CARE_PROVIDER_SITE_OTHER): Payer: Medicare Other | Admitting: Family Medicine

## 2014-05-29 VITALS — BP 156/82 | HR 70 | Temp 98.0°F | Resp 17 | Ht 71.0 in | Wt 325.0 lb

## 2014-05-29 DIAGNOSIS — J209 Acute bronchitis, unspecified: Secondary | ICD-10-CM

## 2014-05-29 MED ORDER — HYDROCOD POLST-CHLORPHEN POLST 10-8 MG/5ML PO LQCR
5.0000 mL | Freq: Two times a day (BID) | ORAL | Status: DC | PRN
Start: 2014-05-29 — End: 2014-08-29

## 2014-05-29 MED ORDER — AZITHROMYCIN 250 MG PO TABS: ORAL_TABLET | ORAL | Status: DC

## 2014-05-29 MED ORDER — HYDROCOD POLST-CHLORPHEN POLST 10-8 MG/5ML PO LQCR: 5.0000 mL | Freq: Two times a day (BID) | ORAL | Status: DC | PRN

## 2014-05-29 NOTE — Patient Instructions (Signed)

## 2014-05-29 NOTE — Progress Notes (Signed)
68 yo gentleman who quit cigarettes 10 years who  Now works as Corporate treasurer at the [employer] and who comes in with 24 hours of cough, pretty bad and productive of phlegm.  Objective: No acute distress the patient does have a very serious deep and persistent cough during the interview. HEENT: Unremarkable except for large tongue. Chest: Diffuse expiratory wheezes. Few bibasilar rales. Good breath sounds bilaterally. Heart: Regular no murmur Extremities: No edema  Acute bronchitis, unspecified organism - Plan: azithromycin (ZITHROMAX Z-PAK) 250 MG tablet, chlorpheniramine-HYDROcodone (TUSSIONEX PENNKINETIC ER) 10-8 MG/5ML LQCR, DISCONTINUED: chlorpheniramine-HYDROcodone (TUSSIONEX PENNKINETIC ER) 10-8 MG/5ML LQCR  Signed, Robyn Haber, MD

## 2014-07-05 ENCOUNTER — Other Ambulatory Visit: Payer: Self-pay | Admitting: Emergency Medicine

## 2014-07-09 ENCOUNTER — Other Ambulatory Visit: Payer: Self-pay | Admitting: *Deleted

## 2014-07-09 ENCOUNTER — Encounter: Payer: Self-pay | Admitting: Vascular Surgery

## 2014-07-09 DIAGNOSIS — I714 Abdominal aortic aneurysm, without rupture, unspecified: Secondary | ICD-10-CM

## 2014-07-09 DIAGNOSIS — Z01818 Encounter for other preprocedural examination: Secondary | ICD-10-CM

## 2014-07-09 LAB — BUN: BUN: 18 mg/dL (ref 6–23)

## 2014-07-09 LAB — CREATININE, SERUM: Creat: 1.2 mg/dL (ref 0.50–1.35)

## 2014-07-10 ENCOUNTER — Ambulatory Visit
Admission: RE | Admit: 2014-07-10 | Discharge: 2014-07-10 | Disposition: A | Discharge status: Home / Self Care | Payer: Medicare Other | Source: Ambulatory Visit | Attending: Vascular Surgery | Admitting: Vascular Surgery

## 2014-07-10 ENCOUNTER — Ambulatory Visit (INDEPENDENT_AMBULATORY_CARE_PROVIDER_SITE_OTHER): Payer: Medicare Other | Admitting: Vascular Surgery

## 2014-07-10 ENCOUNTER — Encounter: Payer: Self-pay | Admitting: Vascular Surgery

## 2014-07-10 VITALS — BP 136/61 | HR 65 | Ht 71.0 in | Wt 322.0 lb

## 2014-07-10 DIAGNOSIS — I714 Abdominal aortic aneurysm, without rupture, unspecified: Secondary | ICD-10-CM

## 2014-07-10 DIAGNOSIS — Z9889 Other specified postprocedural states: Secondary | ICD-10-CM

## 2014-07-10 DIAGNOSIS — Z8679 Personal history of other diseases of the circulatory system: Secondary | ICD-10-CM

## 2014-07-10 DIAGNOSIS — Z95828 Presence of other vascular implants and grafts: Secondary | ICD-10-CM

## 2014-07-10 HISTORY — DX: Abdominal aortic aneurysm, without rupture, unspecified: I71.40

## 2014-07-10 HISTORY — DX: Abdominal aortic aneurysm, without rupture: I71.4

## 2014-07-10 MED ORDER — IOHEXOL 350 MG/ML SOLN
80.0000 mL | Freq: Once | INTRAVENOUS | Status: AC | PRN
  Administered 2014-07-10: 80 mL via INTRAVENOUS

## 2014-07-10 NOTE — Progress Notes (Signed)
Subjective:     Patient ID: William Hebert, male   DOB: 18-Oct-1945, 68 y.o.   MRN: [id number]  HPI this 68 year old male returns 3 years post endovascular stent graft repair of abdominal aortic aneurysm. He had a CT angiogram performed today. He's had no specific complaints relative to the surgery since last year. He denies any chest pain but does have chronic dyspnea on exertion. His biggest complaint is bilateral lower extremity edema which has been previously evaluated. He has been unable to wear elastic compression stockings and also unable to sleep with the foot of his bed elevated.  Past Medical History  Diagnosis Date  . HTN (hypertension)   . GERD (gastroesophageal reflux disease)   . Testosterone deficiency   . Hyperlipidemia   . Morbidly obese   . Headache(784.0)   . Double vision   . Shortness of breath   . Arthritis   . Cancer     History  Substance Use Topics  . Smoking status: Former Smoker -- 2.00 packs/day for 40 years    Types: Cigarettes    Quit date: 08/31/2005  . Smokeless tobacco: Former Systems developer  . Alcohol Use: No    Family History  Problem Relation Age of Onset  . Cancer Mother   . Heart disease Maternal Aunt     No Active Allergies  Current outpatient prescriptions: Ascorbic Acid (VITAMIN C) 1000 MG tablet, Take 1,000 mg by mouth daily., Disp: , Rfl: ;  aspirin 81 MG tablet, Take 81 mg by mouth daily.  , Disp: , Rfl: ;  chlorpheniramine-HYDROcodone (TUSSIONEX PENNKINETIC ER) 10-8 MG/5ML LQCR, Take 5 mLs by mouth every 12 (twelve) hours as needed., Disp: 115 mL, Rfl: 0;  diclofenac (VOLTAREN) 75 MG EC tablet, Take 1 tablet by mouth Twice daily., Disp: , Rfl:  furosemide (LASIX) 40 MG tablet, Take 1 tablet (40 mg total) by mouth daily., Disp: 90 tablet, Rfl: 1;  labetalol (NORMODYNE) 200 MG tablet, Take 200 mg by mouth daily., Disp: , Rfl: ;  Misc Natural Products (OSTEO BI-FLEX ADV DOUBLE ST PO), Take by mouth.  , Disp: , Rfl: ;  Nebivolol HCl (BYSTOLIC) 20 MG  TABS, Take 1 tablet (20 mg total) by mouth 2 (two) times daily., Disp: 180 tablet, Rfl: 3 NIFEdipine (PROCARDIA XL/ADALAT-CC) 60 MG 24 hr tablet, Take 1 tablet (60 mg total) by mouth daily., Disp: 90 tablet, Rfl: 2;  omeprazole (PRILOSEC) 20 MG capsule, Take one tablet bid, Disp: 180 capsule, Rfl: 3;  pravastatin (PRAVACHOL) 40 MG tablet, Take one tablet daily, Disp: 90 tablet, Rfl: 3;  pyridOXINE (VITAMIN B-6) 100 MG tablet, Take 100 mg by mouth daily. , Disp: , Rfl:  sildenafil (REVATIO) 20 MG tablet, 3-5 tabs as needed for ED symptoms, Disp: 50 tablet, Rfl: 11;  spironolactone (ALDACTONE) 25 MG tablet, Take 25 mg by mouth daily.  , Disp: , Rfl: ;  azithromycin (ZITHROMAX Z-PAK) 250 MG tablet, Take as directed on pack, Disp: 6 tablet, Rfl: 0;  B Complex Vitamins (VITAMIN B COMPLEX PO), Take by mouth daily., Disp: , Rfl: ;  b complex vitamins tablet, Take 1 tablet by mouth daily., Disp: , Rfl:  oseltamivir (TAMIFLU) 75 MG capsule, Take 1 capsule (75 mg total) by mouth daily., Disp: 10 capsule, Rfl: 0;  OVER THE COUNTER MEDICATION, 1 tablet daily. Calcium magnesium complex 1000-500mg , Disp: , Rfl:   BP 136/61 mmHg  Pulse 65  Ht 5\' 11"  (1.803 m)  Wt 322 lb (146.058 kg)  BMI 44.93 kg/m2  SpO2 95%  Body mass index is 44.93 kg/(m^2).          Review of SystemsDenies chest pain but does have dyspnea on exertion, leg pain with walking, chronic edema, sleep apnea. Other systems negative and a complete review of systems other than his morbid obesity     Objective:   Physical Exam BP 136/61 mmHg  Pulse 65  Ht 5\' 11"  (1.803 m)  Wt 322 lb (146.058 kg)  BMI 44.93 kg/m2  SpO2 95%  Gen.-alert and oriented x3 in no apparent distress-morbidly obese HEENT normal for age Lungs no rhonchi or wheezing Cardiovascular regular rhythm no murmurs carotid pulses 3+ palpable no bruits audible Abdomen soft nontender no palpable masses-obese Musculoskeletal free of  major deformities Skin clear -no  rashes Neurologic normal Lower extremities 3+ femoral and Posterior tibial pulses palpable-2-3+ with chronic 1+ to 2+ edema bilaterally  Today I ordered a CT angiogram of abdomen and pelvis which are reviewed by computer. The diameter of the thrombosed aneurysm sac is unchanged at 7 cm. There is no evidence of type II endoleak today. Graft has not migrated.      Assessment:     Doing well 3 years post endovascular stent graft repair of abdominal aortic aneurysm with no evidence of endoleak-sac not enlarging measures 7 cm in diameter     Plan:     Return in 1 year with duplex scan of the stent graft and aneurysm sac in our office-patient is morbidly obese and we may not get satisfactory images Again explained in detail to patient and his wife about elevation of the foot of bed 2-3 inches to assist in eliminating some of his edema. This will then require putting on short leg elastic compression stockings immediately in the morning. They will give this a try. He is currently on diuretics.

## 2014-07-10 NOTE — Addendum Note (Signed)
Addended by: Mena Goes on: 07/10/2014 04:35 PM   Modules accepted: Orders

## 2014-07-16 ENCOUNTER — Other Ambulatory Visit: Payer: Self-pay | Admitting: Emergency Medicine

## 2014-08-07 ENCOUNTER — Ambulatory Visit (INDEPENDENT_AMBULATORY_CARE_PROVIDER_SITE_OTHER): Payer: 59

## 2014-08-07 ENCOUNTER — Ambulatory Visit (INDEPENDENT_AMBULATORY_CARE_PROVIDER_SITE_OTHER): Payer: 59 | Admitting: Emergency Medicine

## 2014-08-07 ENCOUNTER — Encounter: Payer: Self-pay | Admitting: Emergency Medicine

## 2014-08-07 VITALS — BP 169/89 | HR 74 | Temp 98.3°F | Resp 18 | Ht 72.0 in | Wt 320.6 lb

## 2014-08-07 DIAGNOSIS — I499 Cardiac arrhythmia, unspecified: Secondary | ICD-10-CM

## 2014-08-07 DIAGNOSIS — E291 Testicular hypofunction: Secondary | ICD-10-CM

## 2014-08-07 DIAGNOSIS — R059 Cough, unspecified: Secondary | ICD-10-CM

## 2014-08-07 DIAGNOSIS — I10 Essential (primary) hypertension: Secondary | ICD-10-CM

## 2014-08-07 DIAGNOSIS — R7989 Other specified abnormal findings of blood chemistry: Secondary | ICD-10-CM

## 2014-08-07 DIAGNOSIS — R05 Cough: Secondary | ICD-10-CM

## 2014-08-07 DIAGNOSIS — N529 Male erectile dysfunction, unspecified: Secondary | ICD-10-CM

## 2014-08-07 DIAGNOSIS — R9431 Abnormal electrocardiogram [ECG] [EKG]: Secondary | ICD-10-CM

## 2014-08-07 LAB — CBC WITH DIFFERENTIAL/PLATELET
Basophils Absolute: 0.1 10*3/uL (ref 0.0–0.1)
Basophils Relative: 1 % (ref 0–1)
Eosinophils Absolute: 0.1 10*3/uL (ref 0.0–0.7)
Eosinophils Relative: 2 % (ref 0–5)
HCT: 42.4 % (ref 39.0–52.0)
Hemoglobin: 14.6 g/dL (ref 13.0–17.0)
Lymphocytes Relative: 21 % (ref 12–46)
Lymphs Abs: 1.5 10*3/uL (ref 0.7–4.0)
MCH: 30 pg (ref 26.0–34.0)
MCHC: 34.4 g/dL (ref 30.0–36.0)
MCV: 87.1 fL (ref 78.0–100.0)
MPV: 9.7 fL (ref 9.4–12.4)
Monocytes Absolute: 0.7 10*3/uL (ref 0.1–1.0)
Monocytes Relative: 10 % (ref 3–12)
Neutro Abs: 4.7 10*3/uL (ref 1.7–7.7)
Neutrophils Relative %: 66 % (ref 43–77)
Platelets: 213 10*3/uL (ref 150–400)
RBC: 4.87 MIL/uL (ref 4.22–5.81)
RDW: 13.8 % (ref 11.5–15.5)
WBC: 7.1 10*3/uL (ref 4.0–10.5)

## 2014-08-07 LAB — BASIC METABOLIC PANEL WITH GFR
BUN: 17 mg/dL (ref 6–23)
CO2: 26 mEq/L (ref 19–32)
Calcium: 9.7 mg/dL (ref 8.4–10.5)
Chloride: 105 mEq/L (ref 96–112)
Creat: 1.11 mg/dL (ref 0.50–1.35)
GFR, Est African American: 80 mL/min
GFR, Est Non African American: 69 mL/min
Glucose, Bld: 120 mg/dL — ABNORMAL HIGH (ref 70–99)
Potassium: 3.3 mEq/L — ABNORMAL LOW (ref 3.5–5.3)
Sodium: 141 mEq/L (ref 135–145)

## 2014-08-07 MED ORDER — AZELASTINE HCL 0.1 % NA SOLN: 2.0000 | Freq: Two times a day (BID) | NASAL | Status: DC

## 2014-08-07 NOTE — Progress Notes (Addendum)
Subjective:  This chart was scribed for Arlyss Queen, MD by Donato Schultz, Medical Scribe. This patient was seen in Room 23 and the patient's care was started at [place] PM.   Patient ID: William Hebert, male    DOB: February 10, 1946, 68 y.o.   MRN: [id number]  HPI HPI Comments: William Hebert is a 68 y.o. male who presents to the [facility] complaining of constant right shoulder pain that started after he got his tetanus vaccine.  He has applied a heating pad to the area with temporary relief to his pain.  He states that when he is active during the day he cannot feel the pain but the pain returns when he lays down at night.  He has his aneurysm is checked annual and at his next visit he does not have to have an MRI with contrast.  His cardiologist is Dr. Einar Gip and his last visit with him was 2-3 years ago.  He had a stress test conducted on February 26, 2011 which was normal.    He is also complaining of a recurring productive cough.    He is still working 1 shift a week.    Past Medical History  Diagnosis Date  . HTN (hypertension)   . GERD (gastroesophageal reflux disease)   . Testosterone deficiency   . Hyperlipidemia   . Morbidly obese   . Headache(784.0)   . Double vision   . Shortness of breath   . Arthritis   . Cancer    Past Surgical History  Procedure Laterality Date  . Knee surgery  x 2  . Umbilical hernia repair    . Abdominal aortic aneurysm repair  05/20/11    aorto bifem. stent graft  . Foot tendon surgery  rt foot  . Skin surgery      3 different surgeries for melanoma  . Cataract extraction    . Mass excision N/A 05/10/2013    Procedure: EXCISION back MASS;  Surgeon: Joyice Faster. Cornett, MD;  Location: [facility];  Service: General;  Laterality: N/A;  . Elbow surgery     Family History  Problem Relation Age of Onset  . Cancer Mother   . Heart disease Maternal Aunt    History   Social History  . Marital Status: Married    Spouse  Name: N/A    Number of Children: N/A  . Years of Education: N/A   Occupational History  . 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     Social History Main Topics  . Smoking status: Former Smoker -- 2.00 packs/day for 40 years    Types: Cigarettes    Quit date: 08/31/2005  . Smokeless tobacco: Former Systems developer  . Alcohol Use: No  . Drug Use: No  . Sexual Activity: Not on file   Other Topics Concern  . Not on file   Social History Narrative   No Active Allergies  Review of Systems  Respiratory: Positive for cough.   Musculoskeletal: Positive for arthralgias.    Objective:  Physical Exam  Constitutional: He is oriented to person, place, and time. He appears well-developed and well-nourished.  Morbidly obese male who is not in any distress.  HENT:  Head: Normocephalic and atraumatic.  Eyes: EOM are normal.  Neck: Normal range of motion.  Cardiovascular: Normal rate and normal heart sounds.  An irregular rhythm present.  No murmur heard. Frequently skipped beats.  Pulmonary/Chest: Effort normal and breath sounds normal. No respiratory distress. He has no  wheezes. He has no rales.  Musculoskeletal: Normal range of motion. He exhibits tenderness.  Tenderness on the right shoulder over the site of his Tdap injection.  Neurological: He is alert and oriented to person, place, and time.  Skin: Skin is warm and dry.  Psychiatric: He has a normal mood and affect. His behavior is normal.  Nursing note and vitals reviewed. [facility] reading (PRIMARY) by  Dr. Everlene Farrier no acute disease heart size normal  EKG - there is an incomplete right bundle branch block left axis deviation and a first degree heart block  BP 169/89 mmHg  Pulse 74  Temp(Src) 98.3 F (36.8 C) (Oral)  Resp 18  Ht 6' (1.829 m)  Wt 320 lb 9.6 oz (145.423 kg)  BMI 43.47 kg/m2  SpO2 94% Assessment & Plan:  Patient has irregular heart rhythm.  His last stress test was in June 2012 and normal.  Will get a regular EKG and refer back to Dr. Einar Gip for  further evaluation.  He has a tender area on his right shoulder over the site of his Tdap injection which appears to be a resolving hematoma.   He did have some PVCs on exam but not documented on his EKG. Referral made to [facility] office for reevaluation. Blood pressure remains elevated sugar related to his obstructive sleep apnea and weight. I did not make any medication changes.

## 2014-08-07 NOTE — Patient Instructions (Addendum)
We have made appointment for you to see Dr Einar Gip, you will see Bridgette his nurse practitioner first. Your appointment is on Monday Dec 13th at 11:30. Please arrive 15 minutes early for paperwork Cough, Adult  A cough is a reflex that helps clear your throat and airways. It can help heal the body or may be a reaction to an irritated airway. A cough may only last 2 or 3 weeks (acute) or may last more than 8 weeks (chronic).  CAUSES Acute cough:  Viral or bacterial infections. Chronic cough:  Infections.  Allergies.  Asthma.  Post-nasal drip.  Smoking.  Heartburn or acid reflux.  Some medicines.  Chronic lung problems (COPD).  Cancer. SYMPTOMS   Cough.  Fever.  Chest pain.  Increased breathing rate.  High-pitched whistling sound when breathing (wheezing).  Colored mucus that you cough up (sputum). TREATMENT   A bacterial cough may be treated with antibiotic medicine.  A viral cough must run its course and will not respond to antibiotics.  Your caregiver may recommend other treatments if you have a chronic cough. HOME CARE INSTRUCTIONS   Only take over-the-counter or prescription medicines for pain, discomfort, or fever as directed by your caregiver. Use cough suppressants only as directed by your caregiver.  Use a cold steam vaporizer or humidifier in your bedroom or home to help loosen secretions.  Sleep in a semi-upright position if your cough is worse at night.  Rest as needed.  Stop smoking if you smoke. SEEK IMMEDIATE MEDICAL CARE IF:   You have pus in your sputum.  Your cough starts to worsen.  You cannot control your cough with suppressants and are losing sleep.  You begin coughing up blood.  You have difficulty breathing.  You develop pain which is getting worse or is uncontrolled with medicine.  You have a fever. MAKE SURE YOU:   Understand these instructions.  Will watch your condition.  Will get help right away if you are not  doing well or get worse. Document Released: 02/13/2011 Document Revised: 11/09/2011 Document Reviewed: 02/13/2011 [facility] Patient Information 2015 [place], [place]. This information is not intended to replace advice given to you by your health care provider. Make sure you discuss any questions you have with your health care provider.

## 2014-08-08 LAB — TESTOSTERONE, FREE, TOTAL, SHBG
Sex Hormone Binding: 27 nmol/L (ref 13–71)
Testosterone, Free: 39.2 pg/mL — ABNORMAL LOW (ref 47.0–244.0)
Testosterone-% Free: 2.1 % (ref 1.6–2.9)
Testosterone: 185 ng/dL — ABNORMAL LOW (ref 300–890)

## 2014-08-09 ENCOUNTER — Telehealth: Payer: Self-pay

## 2014-08-09 NOTE — Telephone Encounter (Signed)
Called patient regarding flu shot.  He states that he does not take the flu shot.

## 2014-08-10 ENCOUNTER — Other Ambulatory Visit: Payer: Self-pay | Admitting: Radiology

## 2014-08-10 MED ORDER — POTASSIUM CHLORIDE CRYS ER 20 MEQ PO TBCR: 20.0000 meq | EXTENDED_RELEASE_TABLET | Freq: Every day | ORAL | Status: DC

## 2014-08-29 ENCOUNTER — Encounter: Payer: Self-pay | Admitting: Podiatry

## 2014-08-29 ENCOUNTER — Ambulatory Visit (INDEPENDENT_AMBULATORY_CARE_PROVIDER_SITE_OTHER): Payer: 59

## 2014-08-29 ENCOUNTER — Ambulatory Visit (INDEPENDENT_AMBULATORY_CARE_PROVIDER_SITE_OTHER): Payer: 59 | Admitting: Podiatry

## 2014-08-29 VITALS — BP 130/65 | HR 67 | Resp 16

## 2014-08-29 DIAGNOSIS — M779 Enthesopathy, unspecified: Secondary | ICD-10-CM

## 2014-08-29 DIAGNOSIS — M1990 Unspecified osteoarthritis, unspecified site: Secondary | ICD-10-CM

## 2014-08-29 MED ORDER — TRIAMCINOLONE ACETONIDE 10 MG/ML IJ SUSP
10.0000 mg | Freq: Once | INTRAMUSCULAR | Status: AC
  Administered 2014-08-29: 10 mg

## 2014-08-29 NOTE — Progress Notes (Signed)
Subjective:     Patient ID: William Hebert, male   DOB: May 07, 1946, 68 y.o.   MRN: [id number]  HPI patient presents stating I have a lot of pain in the lateral side of my right foot and ankle over the left one and I have had numerous surgeries and had surgeries done about 3 years ago to try to straighten my foot that was not successful. Patient is significantly obese which is contributory to his problems   Review of Systems  All other systems reviewed and are negative.      Objective:   Physical Exam  Constitutional: He is oriented to person, place, and time.  Cardiovascular: Intact distal pulses.   Neurological: He is oriented to person, place, and time.  Skin: Skin is warm and dry.  Nursing note and vitals reviewed.  neurovascular status found to be intact with diminished range of motion of the subtalar joint right over left and diminished muscle strength noted. Patient has scar on the lateral medial side of the foot and around the gastroc where he had a lengthening done and is noted to have quite a bit of discomfort in the peroneal tendon group underneath the lateral malleolus and at its insertion into the peroneal group and base of fifth metatarsal.     Assessment:     Numerous problems with probable deep arthritis tendinitis and adhesive the as complicating factors    Plan:     H&P and x-rays reviewed and today I did 2 separate injections 1 into the lateral ankle gutter and one into the peroneal base fifth metatarsal 3 mg Kenalog 5 mill grams Xylocaine and advised we can do this on the left foot if 4 able to get relief on the right foot. He will be seen back in 2 weeks to reevaluate

## 2014-08-29 NOTE — Progress Notes (Signed)
   Subjective:    Patient ID: William Hebert, male    DOB: 09-Jun-1946, 68 y.o.   MRN: [id number]  HPI Comments: "His feet just hurt a lot"  Patient c/o aching lateral and plantar foot bilateral, right over left, for several years. He is retired from [employer] facility, but still works Friday nights. Worse after being on feet all day. Swelling. Injury to the right foot years ago as a teenager. Tried Tylenol and ice. No help.     Review of Systems  HENT: Positive for sinus pressure and tinnitus.   Respiratory: Positive for apnea, cough and shortness of breath.   Cardiovascular: Positive for leg swelling.  Endocrine: Positive for polyuria.  Musculoskeletal: Positive for back pain, arthralgias and gait problem.  Neurological: Positive for dizziness.  Hematological: Bruises/bleeds easily.  All other systems reviewed and are negative.      Objective:   Physical Exam        Assessment & Plan:

## 2014-09-11 ENCOUNTER — Encounter: Payer: Self-pay | Admitting: Emergency Medicine

## 2014-09-11 ENCOUNTER — Ambulatory Visit (INDEPENDENT_AMBULATORY_CARE_PROVIDER_SITE_OTHER): Payer: Medicare Other | Admitting: Emergency Medicine

## 2014-09-11 VITALS — BP 160/80 | HR 74 | Temp 97.9°F | Resp 16 | Ht 71.0 in | Wt 322.0 lb

## 2014-09-11 DIAGNOSIS — E876 Hypokalemia: Secondary | ICD-10-CM

## 2014-09-11 DIAGNOSIS — N528 Other male erectile dysfunction: Secondary | ICD-10-CM

## 2014-09-11 DIAGNOSIS — Z79899 Other long term (current) drug therapy: Secondary | ICD-10-CM

## 2014-09-11 DIAGNOSIS — E119 Type 2 diabetes mellitus without complications: Secondary | ICD-10-CM

## 2014-09-11 DIAGNOSIS — Z125 Encounter for screening for malignant neoplasm of prostate: Secondary | ICD-10-CM

## 2014-09-11 LAB — BASIC METABOLIC PANEL WITH GFR
BUN: 18 mg/dL (ref 6–23)
CO2: 23 mEq/L (ref 19–32)
Calcium: 9.2 mg/dL (ref 8.4–10.5)
Chloride: 104 mEq/L (ref 96–112)
Creat: 1.19 mg/dL (ref 0.50–1.35)
GFR, Est African American: 73 mL/min
GFR, Est Non African American: 63 mL/min
Glucose, Bld: 102 mg/dL — ABNORMAL HIGH (ref 70–99)
Potassium: 4 mEq/L (ref 3.5–5.3)
Sodium: 137 mEq/L (ref 135–145)

## 2014-09-11 LAB — POCT GLYCOSYLATED HEMOGLOBIN (HGB A1C): Hemoglobin A1C: 5.5

## 2014-09-11 MED ORDER — TESTOSTERONE 50 MG/5GM (1%) TD GEL: 5.0000 g | Freq: Every day | TRANSDERMAL | Status: DC

## 2014-09-11 NOTE — Progress Notes (Signed)
Subjective:  This chart was scribed for Darlyne Russian, MD by Ladene Artist, ED Scribe. The patient was seen in room 21. Patient's care was started at 3:51 PM.   Patient ID: William Hebert, male    DOB: [birth year], 69 y.o.   MRN: [id number]  Chief Complaint  Patient presents with  . Follow-up    Blood work   HPI HPI Comments: William Hebert is a 69 y.o. male, with a h/o HTN, hyperlipidemia, GERD, CA, who presents to the [facility] for a follow-up. Pt was seen by Dr. Einar Gip and started back on Testim 1% gel 2 weeks ago. Pt takes 1 packet a day as prescribed. Pt states that he has not noticed a difference yet.   ED Pt reports that he still experiences complications. He states that he has tried medications for ED before that occasionally worked. Pt states that if his foot pain is severe, he does not participate in sexual intercourse due to pain.  Last A1C was July 2015.  Past Medical History  Diagnosis Date  . HTN (hypertension)   . GERD (gastroesophageal reflux disease)   . Testosterone deficiency   . Hyperlipidemia   . Morbidly obese   . Headache(784.0)   . Double vision   . Shortness of breath   . Arthritis   . Cancer    Current Outpatient Prescriptions on File Prior to Visit  Medication Sig Dispense Refill  . aspirin 81 MG tablet Take 81 mg by mouth daily.      [name] azelastine (ASTELIN) 0.1 % nasal spray Place 2 sprays into both nostrils 2 (two) times daily. Use in each nostril as directed 30 mL 11  . furosemide (LASIX) 40 MG tablet Take 1 tablet (40 mg total) by mouth daily. 90 tablet 1  . labetalol (NORMODYNE) 200 MG tablet Take 200 mg by mouth daily.    . Misc Natural Products (OSTEO BI-FLEX ADV DOUBLE ST PO) Take by mouth.      . Nebivolol HCl (BYSTOLIC) 20 MG TABS Take 1 tablet (20 mg total) by mouth 2 (two) times daily. 180 tablet 3  . NIFEdipine (PROCARDIA XL/ADALAT-CC) 60 MG 24 hr tablet Take 1 tablet (60 mg total) by mouth daily. 90 tablet 2  .  omeprazole (PRILOSEC) 20 MG capsule Take one tablet bid 180 capsule 3  . potassium chloride SA (K-DUR,KLOR-CON) 20 MEQ tablet Take 1 tablet (20 mEq total) by mouth daily. 90 tablet 1  . sildenafil (REVATIO) 20 MG tablet 3-5 tabs as needed for ED symptoms 50 tablet 11  . spironolactone (ALDACTONE) 25 MG tablet Take 25 mg by mouth daily.       No current facility-administered medications on file prior to visit.   No Active Allergies  Review of Systems  Constitutional: Negative for fever and chills.  Musculoskeletal: Positive for myalgias.      Objective:   Physical Exam CONSTITUTIONAL: Well developed/well nourished HEAD: Normocephalic/atraumatic EYES: EOMI/PERRL ENMT: Mucous membranes moist NECK: supple no meningeal signs SPINE/BACK:entire spine nontender CV: S1/S2 noted, no murmurs/rubs/gallops noted LUNGS: Lungs are clear to auscultation bilaterally, no apparent distress ABDOMEN: soft, nontender, no rebound or guarding, bowel sounds noted throughout abdomen GU:no cva tenderness NEURO: Pt is awake/alert/appropriate, moves all extremitiesx4.  No facial droop.   EXTREMITIES: pulses normal/equal, full ROM SKIN: warm, color normal PSYCH: no abnormalities of mood noted, alert and oriented to situation    Results for orders placed or performed in visit on 09/11/14  POCT glycosylated hemoglobin (Hb A1C)  Result Value Ref Range   Hemoglobin A1C 5.5     Assessment & Plan:  Patient states he has had his workup with Dr. Einar Gip. He was started on Testim 5% to apply once a day. He is not sure whether he feels any better with this. He really has not noted much improvement in fatigue. He still struggles with ED. His sugar was elevated to 150 so hemoglobin A1c was done today and it is 5.5. I did get a baseline PSA since he is taking testosterone replacement.I personally performed the services described in this documentation, which was scribed in my presence. The recorded information has been  reviewed and is accurate.

## 2014-09-12 ENCOUNTER — Ambulatory Visit: Payer: Self-pay | Admitting: Podiatry

## 2014-09-12 LAB — PSA, MEDICARE: PSA: 0.84 ng/mL (ref <=4.00)

## 2014-10-20 ENCOUNTER — Other Ambulatory Visit: Payer: Self-pay | Admitting: Emergency Medicine

## 2014-11-29 ENCOUNTER — Ambulatory Visit (INDEPENDENT_AMBULATORY_CARE_PROVIDER_SITE_OTHER): Payer: Medicare Other

## 2014-11-29 ENCOUNTER — Ambulatory Visit (INDEPENDENT_AMBULATORY_CARE_PROVIDER_SITE_OTHER): Payer: Medicare Other | Admitting: Family Medicine

## 2014-11-29 VITALS — BP 151/88 | HR 63 | Temp 97.9°F | Resp 20 | Ht 71.0 in | Wt 323.4 lb

## 2014-11-29 DIAGNOSIS — M545 Low back pain, unspecified: Secondary | ICD-10-CM

## 2014-11-29 DIAGNOSIS — S39012A Strain of muscle, fascia and tendon of lower back, initial encounter: Secondary | ICD-10-CM | POA: Diagnosis not present

## 2014-11-29 MED ORDER — METHOCARBAMOL 500 MG PO TABS: 500.0000 mg | ORAL_TABLET | Freq: Four times a day (QID) | ORAL | Status: DC

## 2014-11-29 NOTE — Progress Notes (Signed)
Subjective:   This chart was scribed for Reginia Forts, MD by Erling Conte, ED Scribe. This patient was seen in Room 10 and the patient's care was started at 4:15 PM.   Patient ID: William Hebert, male    DOB: 1946-06-12, 69 y.o.   MRN: [id number]  11/29/2014  Back Pain  HPI  HPI Comments: William Hebert is a 69 y.o. male who presents to the [facility] complaining of constant, moderate, "sharp", "6/10",  back pain for 1 day. Pt states the pain is worse in the left side than the right. He states he injured his back trying to pull crank cord of his weed eater and he immediately began crying and his knees "buckled". He denies falling. He states the pain initially radiating into his bilateral legs but that is gradually resolving. He states he is having associated tingling and numbness in his right thigh but states he has this at baseline. He has been applying heat and ice with no relief. Pt also applied butterfly TENS unit to his back with no relief. Pt took Tylenol last night but none today. He denies any h/o back surgeries. He denies any fever, chills, urinary or bowel incontinence, or saddle paresthesias. Does not take NSAIDS due to cardiac hx.  PCP: Jenny Reichmann, MD   Review of Systems  Constitutional: Negative for fever, chills, diaphoresis and fatigue.  Genitourinary: Negative for decreased urine volume and difficulty urinating.  Musculoskeletal: Positive for back pain.  Neurological: Positive for numbness (right thigh at baseline). Negative for weakness.    Past Medical History  Diagnosis Date  . HTN (hypertension)   . GERD (gastroesophageal reflux disease)   . Testosterone deficiency   . Hyperlipidemia   . Morbidly obese   . Headache(784.0)   . Double vision   . Shortness of breath   . Arthritis   . Cancer    Past Surgical History  Procedure Laterality Date  . Knee surgery  x 2  . Umbilical hernia repair    . Abdominal aortic aneurysm repair   05/20/11    aorto bifem. stent graft  . Foot tendon surgery  rt foot  . Skin surgery      3 different surgeries for melanoma  . Cataract extraction    . Mass excision N/A 05/10/2013    Procedure: EXCISION back MASS;  Surgeon: Joyice Faster. Cornett, MD;  Location: [facility];  Service: General;  Laterality: N/A;  . Elbow surgery     No Active Allergies Current Outpatient Prescriptions  Medication Sig Dispense Refill  . aspirin 81 MG tablet Take 81 mg by mouth daily.      [name] azelastine (ASTELIN) 0.1 % nasal spray Place 2 sprays into both nostrils 2 (two) times daily. Use in each nostril as directed 30 mL 11  . eplerenone (INSPRA) 50 MG tablet Take 50 mg by mouth daily.    [name] labetalol (NORMODYNE) 200 MG tablet Take 200 mg by mouth daily.    . Misc Natural Products (OSTEO BI-FLEX ADV DOUBLE ST PO) Take by mouth.      . Nebivolol HCl (BYSTOLIC) 20 MG TABS Take 1 tablet (20 mg total) by mouth 2 (two) times daily. 180 tablet 3  . NIFEDICAL XL 60 MG 24 hr tablet Take 1 tablet by mouth  daily 90 tablet 1  . omeprazole (PRILOSEC) 20 MG capsule Take 1 capsule by mouth two times daily 180 capsule 1  . PRESCRIPTION MEDICATION  Testim 1% 1 packet taking everyday    . testosterone (ANDROGEL) 50 MG/5GM (1%) GEL Place 5 g onto the skin daily. 90 Package 1  . furosemide (LASIX) 40 MG tablet Take 1 tablet (40 mg total) by mouth daily. (Patient not taking: Reported on 11/29/2014) 90 tablet 1  . methocarbamol (ROBAXIN) 500 MG tablet Take 1-2 tablets (500-1,000 mg total) by mouth 4 (four) times daily. 60 tablet 0  . spironolactone (ALDACTONE) 25 MG tablet Take 25 mg by mouth daily.       No current facility-administered medications for this visit.       Objective:    Triage Vitals: BP 151/88 mmHg  Pulse 63  Temp(Src) 97.9 F (36.6 C) (Oral)  Resp 20  Ht 5\' 11"  (1.803 m)  Wt 323 lb 6.4 oz (146.693 kg)  BMI 45.13 kg/m2  SpO2 94%  Physical Exam  Constitutional: He is oriented to person,  place, and time. He appears well-developed and well-nourished. No distress.  obese  HENT:  Head: Normocephalic and atraumatic.  Eyes: Conjunctivae and EOM are normal.  Neck: Neck supple. No tracheal deviation present.  Cardiovascular: Normal rate, regular rhythm and normal heart sounds.   Pulmonary/Chest: Effort normal and breath sounds normal. No respiratory distress. He has no wheezes. He has no rales.  Musculoskeletal: He exhibits tenderness.       Lumbar back: He exhibits decreased range of motion, tenderness and pain. He exhibits no bony tenderness and no spasm.  Lumbar spine: +Tender to upper lumbar region of bilateral paraspinals but no midline tenderness; pain with flexion and extension; pain with lateral bending and rotating side to side. Straight leg raises equivocal.  Motor 5/5 BLE.  Walking intact; marching intact.    Neurological: He is alert and oriented to person, place, and time.  Skin: Skin is warm and dry. He is not diaphoretic.  Psychiatric: He has a normal mood and affect. His behavior is normal.  Nursing note and vitals reviewed.      [facility] reading (PRIMARY) by  Dr. Tamala Julian Lumbar spine films: multi level degenerative changes with spurring nothing acute.    Assessment & Plan:   1. Bilateral low back pain without sciatica   2. Lumbar strain, initial encounter     -New. -Rx for Robaxin 500mg  qid provided; recommend Tylenol PRN; call if needs stronger medication for pain.  Avoid NSAIDs. -Continue heat to back bid for 15-20 minutes. -Avoid heavy lifting for next two weeks; if no improvement in two weeks, call for ortho referral.   Meds ordered this encounter  Medications  . eplerenone (INSPRA) 50 MG tablet    Sig: Take 50 mg by mouth daily.  . methocarbamol (ROBAXIN) 500 MG tablet    Sig: Take 1-2 tablets (500-1,000 mg total) by mouth 4 (four) times daily.    Dispense:  60 tablet    Refill:  0    No Follow-up on file.   I personally performed the services  described in this documentation, which was scribed in my presence. The recorded information has been reviewed and considered.  Henretta Quist Elayne Guerin, M.D. [facility] [address] [place], [place]  [phone] phone [phone] fax

## 2014-11-29 NOTE — Patient Instructions (Signed)
Low Back Sprain with Rehab  A sprain is an injury in which a ligament is torn. The ligaments of the lower back are vulnerable to sprains. However, they are strong and require great force to be injured. These ligaments are important for stabilizing the spinal column. Sprains are classified into three categories. Grade 1 sprains cause pain, but the tendon is not lengthened. Grade 2 sprains include a lengthened ligament, due to the ligament being stretched or partially ruptured. With grade 2 sprains there is still function, although the function may be decreased. Grade 3 sprains involve a complete tear of the tendon or muscle, and function is usually impaired. SYMPTOMS   Severe pain in the lower back.  Sometimes, a feeling of a "pop," "snap," or tear, at the time of injury.  Tenderness and sometimes swelling at the injury site.  Uncommonly, bruising (contusion) within 48 hours of injury.  Muscle spasms in the back. CAUSES  Low back sprains occur when a force is placed on the ligaments that is greater than they can handle. Common causes of injury include:  Performing a stressful act while off-balance.  Repetitive stressful activities that involve movement of the lower back.  Direct hit (trauma) to the lower back. RISK INCREASES WITH:  Contact sports (football, wrestling).  Collisions (major skiing accidents).  Sports that require throwing or lifting (baseball, weightlifting).  Sports involving twisting of the spine (gymnastics, diving, tennis, golf).  Poor strength and flexibility.  Inadequate protection.  Previous back injury or surgery (especially fusion). PREVENTION  Wear properly fitted and padded protective equipment.  Warm up and stretch properly before activity.  Allow for adequate recovery between workouts.  Maintain physical fitness:  Strength, flexibility, and endurance.  Cardiovascular fitness.  Maintain a healthy body weight. PROGNOSIS  If treated  properly, low back sprains usually heal with non-surgical treatment. The length of time for healing depends on the severity of the injury.  RELATED COMPLICATIONS   Recurring symptoms, resulting in a chronic problem.  Chronic inflammation and pain in the low back.  Delayed healing or resolution of symptoms, especially if activity is resumed too soon.  Prolonged impairment.  Unstable or arthritic joints of the low back. TREATMENT  Treatment first involves the use of ice and medicine, to reduce pain and inflammation. The use of strengthening and stretching exercises may help reduce pain with activity. These exercises may be performed at home or with a therapist. Severe injuries may require referral to a therapist for further evaluation and treatment, such as ultrasound. Your caregiver may advise that you wear a back brace or corset, to help reduce pain and discomfort. Often, prolonged bed rest results in greater harm then benefit. Corticosteroid injections may be recommended. However, these should be reserved for the most serious cases. It is important to avoid using your back when lifting objects. At night, sleep on your back on a firm mattress, with a pillow placed under your knees. If non-surgical treatment is unsuccessful, surgery may be needed.  MEDICATION   If pain medicine is needed, nonsteroidal anti-inflammatory medicines (aspirin and ibuprofen), or other minor pain relievers (acetaminophen), are often advised.  Do not take pain medicine for 7 days before surgery.  Prescription pain relievers may be given, if your caregiver thinks they are needed. Use only as directed and only as much as you need.  Ointments applied to the skin may be helpful.  Corticosteroid injections may be given by your caregiver. These injections should be reserved for the most serious cases,   because they may only be given a certain number of times. HEAT AND COLD  Cold treatment (icing) should be applied for 10  to 15 minutes every 2 to 3 hours for inflammation and pain, and immediately after activity that aggravates your symptoms. Use ice packs or an ice massage.  Heat treatment may be used before performing stretching and strengthening activities prescribed by your caregiver, physical therapist, or athletic trainer. Use a heat pack or a warm water soak. SEEK MEDICAL CARE IF:   Symptoms get worse or do not improve in 2 to 4 weeks, despite treatment.  You develop numbness or weakness in either leg.  You lose bowel or bladder function.  Any of the following occur after surgery: fever, increased pain, swelling, redness, drainage of fluids, or bleeding in the affected area.  New, unexplained symptoms develop. (Drugs used in treatment may produce side effects.) EXERCISES  RANGE OF MOTION (ROM) AND STRETCHING EXERCISES - Low Back Sprain Most people with lower back pain will find that their symptoms get worse with excessive bending forward (flexion) or arching at the lower back (extension). The exercises that will help resolve your symptoms will focus on the opposite motion.  Your physician, physical therapist or athletic trainer will help you determine which exercises will be most helpful to resolve your lower back pain. Do not complete any exercises without first consulting with your caregiver. Discontinue any exercises which make your symptoms worse, until you speak to your caregiver. If you have pain, numbness or tingling which travels down into your buttocks, leg or foot, the goal of the therapy is for these symptoms to move closer to your back and eventually resolve. Sometimes, these leg symptoms will get better, but your lower back pain may worsen. This is often an indication of progress in your rehabilitation. Be very alert to any changes in your symptoms and the activities in which you participated in the 24 hours prior to the change. Sharing this information with your caregiver will allow him or her to  most efficiently treat your condition. These exercises may help you when beginning to rehabilitate your injury. Your symptoms may resolve with or without further involvement from your physician, physical therapist or athletic trainer. While completing these exercises, remember:   Restoring tissue flexibility helps normal motion to return to the joints. This allows healthier, less painful movement and activity.  An effective stretch should be held for at least 30 seconds.  A stretch should never be painful. You should only feel a gentle lengthening or release in the stretched tissue. FLEXION RANGE OF MOTION AND STRETCHING EXERCISES: STRETCH - Flexion, Single Knee to Chest   Lie on a firm bed or floor with both legs extended in front of you.  Keeping one leg in contact with the floor, bring your opposite knee to your chest. Hold your leg in place by either grabbing behind your thigh or at your knee.  Pull until you feel a gentle stretch in your low back. Hold __________ seconds.  Slowly release your grasp and repeat the exercise with the opposite side. Repeat __________ times. Complete this exercise __________ times per day.  STRETCH - Flexion, Double Knee to Chest  Lie on a firm bed or floor with both legs extended in front of you.  Keeping one leg in contact with the floor, bring your opposite knee to your chest.  Tense your stomach muscles to support your back and then lift your other knee to your chest. Hold your legs   in place by either grabbing behind your thighs or at your knees.  Pull both knees toward your chest until you feel a gentle stretch in your low back. Hold __________ seconds.  Tense your stomach muscles and slowly return one leg at a time to the floor. Repeat __________ times. Complete this exercise __________ times per day.  STRETCH - Low Trunk Rotation  Lie on a firm bed or floor. Keeping your legs in front of you, bend your knees so they are both pointed toward the  ceiling and your feet are flat on the floor.  Extend your arms out to the side. This will stabilize your upper body by keeping your shoulders in contact with the floor.  Gently and slowly drop both knees together to one side until you feel a gentle stretch in your low back. Hold for __________ seconds.  Tense your stomach muscles to support your lower back as you bring your knees back to the starting position. Repeat the exercise to the other side. Repeat __________ times. Complete this exercise __________ times per day  EXTENSION RANGE OF MOTION AND FLEXIBILITY EXERCISES: STRETCH - Extension, Prone on Elbows   Lie on your stomach on the floor, a bed will be too soft. Place your palms about shoulder width apart and at the height of your head.  Place your elbows under your shoulders. If this is too painful, stack pillows under your chest.  Allow your body to relax so that your hips drop lower and make contact more completely with the floor.  Hold this position for __________ seconds.  Slowly return to lying flat on the floor. Repeat __________ times. Complete this exercise __________ times per day.  RANGE OF MOTION - Extension, Prone Press Ups  Lie on your stomach on the floor, a bed will be too soft. Place your palms about shoulder width apart and at the height of your head.  Keeping your back as relaxed as possible, slowly straighten your elbows while keeping your hips on the floor. You may adjust the placement of your hands to maximize your comfort. As you gain motion, your hands will come more underneath your shoulders.  Hold this position __________ seconds.  Slowly return to lying flat on the floor. Repeat __________ times. Complete this exercise __________ times per day.  RANGE OF MOTION- Quadruped, Neutral Spine   Assume a hands and knees position on a firm surface. Keep your hands under your shoulders and your knees under your hips. You may place padding under your knees for  comfort.  Drop your head and point your tailbone toward the ground below you. This will round out your lower back like an angry cat. Hold this position for __________ seconds.  Slowly lift your head and release your tail bone so that your back sags into a large arch, like an old horse.  Hold this position for __________ seconds.  Repeat this until you feel limber in your low back.  Now, find your "sweet spot." This will be the most comfortable position somewhere between the two previous positions. This is your neutral spine. Once you have found this position, tense your stomach muscles to support your low back.  Hold this position for __________ seconds. Repeat __________ times. Complete this exercise __________ times per day.  STRENGTHENING EXERCISES - Low Back Sprain These exercises may help you when beginning to rehabilitate your injury. These exercises should be done near your "sweet spot." This is the neutral, low-back arch, somewhere between fully rounded   and fully arched, that is your least painful position. When performed in this safe range of motion, these exercises can be used for people who have either a flexion or extension based injury. These exercises may resolve your symptoms with or without further involvement from your physician, physical therapist or athletic trainer. While completing these exercises, remember:   Muscles can gain both the endurance and the strength needed for everyday activities through controlled exercises.  Complete these exercises as instructed by your physician, physical therapist or athletic trainer. Increase the resistance and repetitions only as guided.  You may experience muscle soreness or fatigue, but the pain or discomfort you are trying to eliminate should never worsen during these exercises. If this pain does worsen, stop and make certain you are following the directions exactly. If the pain is still present after adjustments, discontinue the  exercise until you can discuss the trouble with your caregiver. STRENGTHENING - Deep Abdominals, Pelvic Tilt   Lie on a firm bed or floor. Keeping your legs in front of you, bend your knees so they are both pointed toward the ceiling and your feet are flat on the floor.  Tense your lower abdominal muscles to press your low back into the floor. This motion will rotate your pelvis so that your tail bone is scooping upwards rather than pointing at your feet or into the floor. With a gentle tension and even breathing, hold this position for __________ seconds. Repeat __________ times. Complete this exercise __________ times per day.  STRENGTHENING - Abdominals, Crunches   Lie on a firm bed or floor. Keeping your legs in front of you, bend your knees so they are both pointed toward the ceiling and your feet are flat on the floor. Cross your arms over your chest.  Slightly tip your chin down without bending your neck.  Tense your abdominals and slowly lift your trunk high enough to just clear your shoulder blades. Lifting higher can put excessive stress on the lower back and does not further strengthen your abdominal muscles.  Control your return to the starting position. Repeat __________ times. Complete this exercise __________ times per day.  STRENGTHENING - Quadruped, Opposite UE/LE Lift   Assume a hands and knees position on a firm surface. Keep your hands under your shoulders and your knees under your hips. You may place padding under your knees for comfort.  Find your neutral spine and gently tense your abdominal muscles so that you can maintain this position. Your shoulders and hips should form a rectangle that is parallel with the floor and is not twisted.  Keeping your trunk steady, lift your right hand no higher than your shoulder and then your left leg no higher than your hip. Make sure you are not holding your breath. Hold this position for __________ seconds.  Continuing to keep  your abdominal muscles tense and your back steady, slowly return to your starting position. Repeat with the opposite arm and leg. Repeat __________ times. Complete this exercise __________ times per day.  STRENGTHENING - Abdominals and Quadriceps, Straight Leg Raise   Lie on a firm bed or floor with both legs extended in front of you.  Keeping one leg in contact with the floor, bend the other knee so that your foot can rest flat on the floor.  Find your neutral spine, and tense your abdominal muscles to maintain your spinal position throughout the exercise.  Slowly lift your straight leg off the floor about 6 inches for a count   of 15, making sure to not hold your breath.  Still keeping your neutral spine, slowly lower your leg all the way to the floor. Repeat this exercise with each leg __________ times. Complete this exercise __________ times per day. POSTURE AND BODY MECHANICS CONSIDERATIONS - Low Back Sprain Keeping correct posture when sitting, standing or completing your activities will reduce the stress put on different body tissues, allowing injured tissues a chance to heal and limiting painful experiences. The following are general guidelines for improved posture. Your physician or physical therapist will provide you with any instructions specific to your needs. While reading these guidelines, remember:  The exercises prescribed by your provider will help you have the flexibility and strength to maintain correct postures.  The correct posture provides the best environment for your joints to work. All of your joints have less wear and tear when properly supported by a spine with good posture. This means you will experience a healthier, less painful body.  Correct posture must be practiced with all of your activities, especially prolonged sitting and standing. Correct posture is as important when doing repetitive low-stress activities (typing) as it is when doing a single heavy-load  activity (lifting). RESTING POSITIONS Consider which positions are most painful for you when choosing a resting position. If you have pain with flexion-based activities (sitting, bending, stooping, squatting), choose a position that allows you to rest in a less flexed posture. You would want to avoid curling into a fetal position on your side. If your pain worsens with extension-based activities (prolonged standing, working overhead), avoid resting in an extended position such as sleeping on your stomach. Most people will find more comfort when they rest with their spine in a more neutral position, neither too rounded nor too arched. Lying on a non-sagging bed on your side with a pillow between your knees, or on your back with a pillow under your knees will often provide some relief. Keep in mind, being in any one position for a prolonged period of time, no matter how correct your posture, can still lead to stiffness. PROPER SITTING POSTURE In order to minimize stress and discomfort on your spine, you must sit with correct posture. Sitting with good posture should be effortless for a healthy body. Returning to good posture is a gradual process. Many people can work toward this most comfortably by using various supports until they have the flexibility and strength to maintain this posture on their own. When sitting with proper posture, your ears will fall over your shoulders and your shoulders will fall over your hips. You should use the back of the chair to support your upper back. Your lower back will be in a neutral position, just slightly arched. You may place a small pillow or folded towel at the base of your lower back for  support.  When working at a desk, create an environment that supports good, upright posture. Without extra support, muscles tire, which leads to excessive strain on joints and other tissues. Keep these recommendations in mind: CHAIR:  A chair should be able to slide under your desk  when your back makes contact with the back of the chair. This allows you to work closely.  The chair's height should allow your eyes to be level with the upper part of your monitor and your hands to be slightly lower than your elbows. BODY POSITION  Your feet should make contact with the floor. If this is not possible, use a foot rest.  Keep your   ears over your shoulders. This will reduce stress on your neck and low back. INCORRECT SITTING POSTURES  If you are feeling tired and unable to assume a healthy sitting posture, do not slouch or slump. This puts excessive strain on your back tissues, causing more damage and pain. Healthier options include:  Using more support, like a lumbar pillow.  Switching tasks to something that requires you to be upright or walking.  Talking a brief walk.  Lying down to rest in a neutral-spine position. PROLONGED STANDING WHILE SLIGHTLY LEANING FORWARD  When completing a task that requires you to lean forward while standing in one place for a long time, place either foot up on a stationary 2-4 inch high object to help maintain the best posture. When both feet are on the ground, the lower back tends to lose its slight inward curve. If this curve flattens (or becomes too large), then the back and your other joints will experience too much stress, tire more quickly, and can cause pain. CORRECT STANDING POSTURES Proper standing posture should be assumed with all daily activities, even if they only take a few moments, like when brushing your teeth. As in sitting, your ears should fall over your shoulders and your shoulders should fall over your hips. You should keep a slight tension in your abdominal muscles to brace your spine. Your tailbone should point down to the ground, not behind your body, resulting in an over-extended swayback posture.  INCORRECT STANDING POSTURES  Common incorrect standing postures include a forward head, locked knees and/or an excessive  swayback. WALKING Walk with an upright posture. Your ears, shoulders and hips should all line-up. PROLONGED ACTIVITY IN A FLEXED POSITION When completing a task that requires you to bend forward at your waist or lean over a low surface, try to find a way to stabilize 3 out of 4 of your limbs. You can place a hand or elbow on your thigh or rest a knee on the surface you are reaching across. This will provide you more stability, so that your muscles do not tire as quickly. By keeping your knees relaxed, or slightly bent, you will also reduce stress across your lower back. CORRECT LIFTING TECHNIQUES DO :  Assume a wide stance. This will provide you more stability and the opportunity to get as close as possible to the object which you are lifting.  Tense your abdominals to brace your spine. Bend at the knees and hips. Keeping your back locked in a neutral-spine position, lift using your leg muscles. Lift with your legs, keeping your back straight.  Test the weight of unknown objects before attempting to lift them.  Try to keep your elbows locked down at your sides in order get the best strength from your shoulders when carrying an object.  Always ask for help when lifting heavy or awkward objects. INCORRECT LIFTING TECHNIQUES DO NOT:   Lock your knees when lifting, even if it is a small object.  Bend and twist. Pivot at your feet or move your feet when needing to change directions.  Assume that you can safely pick up even a paperclip without proper posture. Document Released: 08/17/2005 Document Revised: 11/09/2011 Document Reviewed: 11/29/2008 [employer] Patient Information 2015 [employer]. This information is not intended to replace advice given to you by your health care provider. Make sure you discuss any questions you have with your health care provider.  

## 2014-12-19 ENCOUNTER — Ambulatory Visit (INDEPENDENT_AMBULATORY_CARE_PROVIDER_SITE_OTHER): Payer: Medicare Other | Admitting: Emergency Medicine

## 2014-12-19 ENCOUNTER — Ambulatory Visit (INDEPENDENT_AMBULATORY_CARE_PROVIDER_SITE_OTHER): Payer: Medicare Other

## 2014-12-19 VITALS — BP 132/80 | HR 71 | Temp 98.0°F | Resp 16 | Ht 72.0 in | Wt 328.0 lb

## 2014-12-19 DIAGNOSIS — M25511 Pain in right shoulder: Secondary | ICD-10-CM

## 2014-12-19 DIAGNOSIS — M542 Cervicalgia: Secondary | ICD-10-CM

## 2014-12-19 DIAGNOSIS — M5010 Cervical disc disorder with radiculopathy, unspecified cervical region: Secondary | ICD-10-CM

## 2014-12-19 MED ORDER — METHOCARBAMOL 500 MG PO TABS: 500.0000 mg | ORAL_TABLET | Freq: Four times a day (QID) | ORAL | Status: DC

## 2014-12-19 NOTE — Progress Notes (Addendum)
Subjective:  This chart was scribed for Arlyss Queen, MD by Donato Schultz, Medical Scribe. This patient was seen in Room 10 and the patient's care was started at 12:01 PM.    Patient ID: William Hebert, male    DOB: Aug 11, 1946, 69 y.o.   MRN: [id number]  HPI HPI Comments: William Hebert is a 69 y.o. male who presents to the [facility] complaining of constant, non-radiating right sided lower back pain with associated numbness in his right leg.  He states that he was using a weed eater and suspects he may have pulled a muscle in his back.  He saw Dr. Tamala Julian at [facility] on 11/29/2014 and was diagnosed with bilateral lower back pain without sciatica.  He was treated with Robaxin and states that the medication decreases the severity of his pain.  However, he states that if he walks a certain way, the pain returns.    He is also complaining of right shoulder pain.  He states that the pain is worse in the morning.  He wraps his arm in a heating pad with temporary relief to his symptoms.    He is complaining of decreased hearing in his left ear.  He had a hearing test done recently and was told that his hearing is normal in both ears.    Past Medical History  Diagnosis Date  . HTN (hypertension)   . GERD (gastroesophageal reflux disease)   . Testosterone deficiency   . Hyperlipidemia   . Morbidly obese   . Headache(784.0)   . Double vision   . Shortness of breath   . Arthritis   . Cancer    Past Surgical History  Procedure Laterality Date  . Knee surgery  x 2  . Umbilical hernia repair    . Abdominal aortic aneurysm repair  05/20/11    aorto bifem. stent graft  . Foot tendon surgery  rt foot  . Skin surgery      3 different surgeries for melanoma  . Cataract extraction    . Mass excision N/A 05/10/2013    Procedure: EXCISION back MASS;  Surgeon: Joyice Faster. Cornett, MD;  Location: [place];  Service: General;  Laterality: N/A;  . Elbow surgery     Family  History  Problem Relation Age of Onset  . Cancer Mother   . Heart disease Maternal Aunt    History   Social History  . Marital Status: Married    Spouse Name: N/A  . Number of Children: N/A  . Years of Education: N/A   Occupational History  . 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     Social History Main Topics  . Smoking status: Former Smoker -- 2.00 packs/day for 40 years    Types: Cigarettes    Quit date: 08/31/2005  . Smokeless tobacco: Former Systems developer  . Alcohol Use: No  . Drug Use: No  . Sexual Activity: Not on file   Other Topics Concern  . Not on file   Social History Narrative   No Known Allergies  Review of Systems  HENT: Positive for hearing loss.   Musculoskeletal: Positive for back pain and arthralgias.     Objective:  Physical Exam  Constitutional: He is oriented to person, place, and time. He appears well-developed and well-nourished.  HENT:  Head: Normocephalic and atraumatic.  Eyes: EOM are normal.  Neck: Normal range of motion.  Cardiovascular: Normal rate.   Pulmonary/Chest: Effort normal.  Musculoskeletal: Normal range of motion.  He is tender over the spinous process of C7.  There is tenderness over the mid-right deltoid but there is no weakness of the right arm.  DTRs in the right arm are normal.  He is tender L5/S1 area on the right but DTR of the knees and ankles are 1+.  There is pain with straight leg raise on the right at 90 degrees.    Neurological: He is alert and oriented to person, place, and time.  Skin: Skin is warm and dry.  Psychiatric: He has a normal mood and affect. His behavior is normal.  Nursing note and vitals reviewed. [facility] reading (PRIMARY) by  Dr.Laythan Hayter C-spine films show some mild arthritic changes otherwise normal x-rays of the right shoulder show before meals joint arthritis.    BP 132/80 mmHg  Pulse 71  Temp(Src) 98 F (36.7 C) (Oral)  Resp 16  Ht 6' (1.829 m)  Wt 328 lb (148.78 kg)  BMI 44.48 kg/m2  SpO2 96% Assessment & Plan:  4  proceed with MRI of the lumbar spine. His muscle relaxants were refilled. He has a follow-up appointment with his orthopedist.I personally performed the services described in this documentation, which was scribed in my presence. The recorded information has been reviewed and is accurate.

## 2014-12-21 ENCOUNTER — Telehealth: Payer: Self-pay | Admitting: Vascular Surgery

## 2014-12-21 NOTE — Telephone Encounter (Signed)
-----   Message from Denman George, RN sent at 12/21/2014  2:11 PM EDT ----- Regarding: RE: carotid imaging Contact: [phone] Yes, please schedule for Bilateral Carotid Duplex and appt. with Dr. Kellie Simmering, next available, if pt. Is asymptomatic.  Thanks.    Here is the note I found on his xray from Dr. Everlene Farrier:   Notes Recorded by Darlyne Russian, MD on 12/20/2014 at 7:35 AM  Call patient he does have some calcification in the carotid artery on the left. He is a regular patient of Dr. Kellie Simmering. If he has not previously had an ultrasound of the carotids please schedule ultrasound carotid bilateral at Dr. Evelena Leyden office. Diagnosis is carotid calcification.  ----- Message -----    From: Gena Fray    Sent: 12/21/2014  12:45 PM      To: Denman George, RN Subject: carotid imaging                                Mr Tesfaye has injured his back and has been seeing Dr Everlene Farrier, his PCP. An xray of the back that Mr Petrovic had (in [facility]) did show calcification of his carotid artery. Patient has been instructed to see Dr Kellie Simmering regarding this issue by Dr Everlene Farrier.   Would we just need to do an ultrasound and have him see JDL soon? I don't think it necessarily has to be triaged, as it was an incidental finding. What do you think?  Thanks, Hinton Dyer

## 2014-12-21 NOTE — Telephone Encounter (Signed)
Spoke with pts wife who was anxious to go ahead and get pt in for ultrasound. Understandably, as he was told to see JDL soon regarding calcification in carotid artery. Pt is scheduled for ultrasound 04/26 and JDL on 05/03. dpm

## 2014-12-24 ENCOUNTER — Other Ambulatory Visit: Payer: Self-pay | Admitting: *Deleted

## 2014-12-24 DIAGNOSIS — I6523 Occlusion and stenosis of bilateral carotid arteries: Secondary | ICD-10-CM

## 2014-12-25 ENCOUNTER — Ambulatory Visit (HOSPITAL_COMMUNITY)
Admission: RE | Admit: 2014-12-25 | Discharge: 2014-12-25 | Disposition: A | Discharge status: Home / Self Care | Payer: Medicare Other | Source: Ambulatory Visit | Attending: Vascular Surgery | Admitting: Vascular Surgery

## 2014-12-25 DIAGNOSIS — I6523 Occlusion and stenosis of bilateral carotid arteries: Secondary | ICD-10-CM | POA: Insufficient documentation

## 2014-12-31 ENCOUNTER — Encounter: Payer: Self-pay | Admitting: Vascular Surgery

## 2015-01-01 ENCOUNTER — Encounter: Payer: Self-pay | Admitting: Vascular Surgery

## 2015-01-01 ENCOUNTER — Ambulatory Visit (INDEPENDENT_AMBULATORY_CARE_PROVIDER_SITE_OTHER): Payer: Medicare Other | Admitting: Vascular Surgery

## 2015-01-01 VITALS — BP 147/80 | HR 65 | Resp 18 | Ht 71.0 in | Wt 332.0 lb

## 2015-01-01 DIAGNOSIS — I714 Abdominal aortic aneurysm, without rupture, unspecified: Secondary | ICD-10-CM

## 2015-01-01 DIAGNOSIS — I6522 Occlusion and stenosis of left carotid artery: Secondary | ICD-10-CM | POA: Diagnosis not present

## 2015-01-01 NOTE — Progress Notes (Signed)
Filed Vitals:   01/01/15 1254 01/01/15 1255  BP: 151/79 147/80  Pulse: 64 65  Resp: 18 18  Height: 5\' 11"  (1.803 m)   Weight: 332 lb (150.594 kg)

## 2015-01-01 NOTE — Addendum Note (Signed)
Addended by: Mena Goes on: 01/01/2015 02:40 PM   Modules accepted: Orders

## 2015-01-01 NOTE — Progress Notes (Signed)
[facility] [place] HISTORY AND PHYSICAL   CC:  Follow up carotid duplex scan  Referring Provider:  Darlyne Russian, MD  HPI: This is a 69 y.o. male who has incidental calcification on cervical xrays is here for f/u carotid duplex scan.  Denies amaurosis fugax, paresthesias, or hemiparesis.  He is followed yearly since his EVAR surgery 3 years ago.  He was last seen by Dr. Kellie Simmering 07/11/2015 with a good report and stable disposition of his endovascular stent graft repair of abdominal aortic aneurysm with no evidence of endoleak-sac not enlarging measures 7 cm in diameter. His past medical history includes hypertension, hyperlipidemia, obesity, history of tobacco abuse and bilateral LE edema.  He takes an 81 mg Aspirin every other day, he stopped his statin last year on his own, and he is on a beta blockier daily.    Past Medical History  Diagnosis Date  . HTN (hypertension)   . GERD (gastroesophageal reflux disease)   . Testosterone deficiency   . Hyperlipidemia   . Morbidly obese   . Headache(784.0)   . Double vision   . Shortness of breath   . Arthritis   . Cancer    Past Surgical History  Procedure Laterality Date  . Knee surgery  x 2  . Umbilical hernia repair    . Abdominal aortic aneurysm repair  05/20/11    aorto bifem. stent graft  . Foot tendon surgery  rt foot  . Skin surgery      3 different surgeries for melanoma  . Cataract extraction    . Mass excision N/A 05/10/2013    Procedure: EXCISION back MASS;  Surgeon: Joyice Faster. Cornett, MD;  Location: [place];  Service: General;  Laterality: N/A;  . Elbow surgery      No Known Allergies  Current Outpatient Prescriptions  Medication Sig Dispense Refill  . aspirin 81 MG tablet Take 81 mg by mouth daily.      [name] azelastine (ASTELIN) 0.1 % nasal spray Place 2 sprays into both nostrils 2 (two) times daily. Use in each nostril as directed 30 mL 11  . eplerenone (INSPRA) 50  MG tablet Take 50 mg by mouth daily.    . furosemide (LASIX) 40 MG tablet Take 1 tablet (40 mg total) by mouth daily. 90 tablet 1  . labetalol (NORMODYNE) 200 MG tablet Take 200 mg by mouth daily.    . methocarbamol (ROBAXIN) 500 MG tablet Take 1-2 tablets (500-1,000 mg total) by mouth 4 (four) times daily. 60 tablet 0  . Misc Natural Products (OSTEO BI-FLEX ADV DOUBLE ST PO) Take by mouth.      . Nebivolol HCl (BYSTOLIC) 20 MG TABS Take 1 tablet (20 mg total) by mouth 2 (two) times daily. 180 tablet 3  . NIFEDICAL XL 60 MG 24 hr tablet Take 1 tablet by mouth  daily 90 tablet 1  . omeprazole (PRILOSEC) 20 MG capsule Take 1 capsule by mouth two times daily 180 capsule 1  . PRESCRIPTION MEDICATION Testim 1% 1 packet taking everyday    . spironolactone (ALDACTONE) 25 MG tablet Take 25 mg by mouth daily.      [name] testosterone (ANDROGEL) 50 MG/5GM (1%) GEL Place 5 g onto the skin daily. 90 Package 1   No current facility-administered medications for this visit.    Family History  Problem Relation Age of Onset  . Cancer Mother   . Heart disease Maternal  Aunt     History   Social History  . Marital Status: Married    Spouse Name: N/A  . Number of Children: N/A  . Years of Education: N/A   Occupational History  . 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     Social History Main Topics  . Smoking status: Former Smoker -- 2.00 packs/day for 40 years    Types: Cigarettes    Quit date: 08/31/2005  . Smokeless tobacco: Former Systems developer  . Alcohol Use: No  . Drug Use: No  . Sexual Activity: Not on file   Other Topics Concern  . Not on file   Social History Narrative     ROS: [x]  Positive   [ ]  Negative   [ ]  All sytems reviewed and are negative  General: [ ]  Weight loss, [ ]  Fever, [ ]  chills Neurologic: [ ]  Dizziness, [ ]  Blackouts, [ ]  Seizure [ ]  Stroke, [ ]  "Mini stroke", [ ]  Slurred speech, [ ]  Temporary blindness; [ ]  weakness in arms or legs, [ ]  Hoarseness Cardiac: [ ]  Chest pain/pressure, [ ]  Shortness  of breath at rest [ ]  Shortness of breath with exertion, [ ]  Atrial fibrillation or irregular heartbeat Vascular: [ ]  Pain in legs with walking, [ ]  Pain in legs at rest, [ ]  Pain in legs at night,  [ ]  Non-healing ulcer, [ ]  Blood clot in vein/DVT,   Pulmonary: [ ]  Home oxygen, [ ]  Productive cough, [ ]  Coughing up blood, [ ]  Asthma,  [ ]  Wheezing Musculoskeletal:  [ ]  Arthritis, [ ]  Low back pain, [ ]  Joint pain Hematologic: [ ]  Easy Bruising, [ ]  Anemia; [ ]  Hepatitis Gastrointestinal: [ ]  Blood in stool, [ ]  Gastroesophageal Reflux/heartburn, [ ]  Trouble swallowing Urinary: [ ]  chronic Kidney disease, [ ]  on HD - [ ]  MWF or [ ]  TTHS, [ ]  Burning with urination, [ ]  Difficulty urinating Endocrine: [ ]  hx of diabetes, [ ]  hx of thyroid disease Skin: [ ]  Rashes, [ ]  Wounds Psychological: [ ]  Anxiety, [ ]  Depression   PHYSICAL EXAMINATION:  Filed Vitals:   01/01/15 1255  BP: 147/80  Pulse: 65  Resp: 18   Body mass index is 46.33 kg/(m^2).  General:  WDWN in NAD Gait: Normal HENT: WNL; normocephalic Eyes: PERRL Pulmonary: normal non-labored breathing , without Rales, rhonchi,  wheezing Cardiac: RRR, without  Murmurs, rubs or gallops; without carotid bruits Abdomen: soft, NT, no masses Skin: without rashes,  ulcers  Vascular Exam/Pulses: Palpable radial, femoral, DP/PT bilateral.  2+ edema bil. LE Extremities: without ischemic changes  Neurologic: A&O X 3; Appropriate Affect ; SENSATION: normal; MOTOR FUNCTION:  moving all extremities equally. Speech is fluent/normal   Non-Invasive Vascular Imaging: Carotid Duplex Scan:  01/01/2015  Right triphasic flow with <40 % stenosis Left triphasic flow with <40 % stenosis  ASSESSMENT/PLAN: 69 y.o. male here for f/u carotid duplex scan.   He has normal flow and velocity.  He has no stenosis in his ICA bilaterally.  He is asymptomatic without history of TIA or stroke.   We recommend he take an 81 mg Aspirin daily and restart his statin  he has agreed.  I gave him a written prescription for pravastin 40 mg BID 180 for 3 moths supply he uses mail in prescriptions he will f/u with Dr. Everlene Farrier will take over his Statin prescriptions from there. He will keep his Nov. 2016 appoint f/u for his EVAR with [facility].  He will f/u carotid studies  PRN.  Roxy Horseman PA-C [facility] [phone]  Clinic MD:   Pt seen and examined in conjunction with Dr. Kellie Simmering  Agree with above assessment Patient has essentially normal carotid duplex exam and is asymptomatic-do not believe he needs regularly scheduled follow-up carotid duplex We will restart his statin which he discontinued independently and he will return in November for CT angiogram of the aneurysm repair

## 2015-01-15 ENCOUNTER — Ambulatory Visit: Payer: Medicare Other | Admitting: Emergency Medicine

## 2015-01-15 ENCOUNTER — Encounter: Payer: Medicare Other | Admitting: Emergency Medicine

## 2015-01-17 ENCOUNTER — Ambulatory Visit: Payer: Medicare Other | Admitting: Emergency Medicine

## 2015-01-30 ENCOUNTER — Ambulatory Visit (INDEPENDENT_AMBULATORY_CARE_PROVIDER_SITE_OTHER): Payer: Medicare Other

## 2015-01-30 ENCOUNTER — Ambulatory Visit (INDEPENDENT_AMBULATORY_CARE_PROVIDER_SITE_OTHER): Payer: Medicare Other | Admitting: Family Medicine

## 2015-01-30 ENCOUNTER — Telehealth: Payer: Self-pay

## 2015-01-30 VITALS — BP 122/66 | HR 66 | Temp 98.6°F | Resp 18 | Ht 71.0 in | Wt 321.6 lb

## 2015-01-30 DIAGNOSIS — R059 Cough, unspecified: Secondary | ICD-10-CM

## 2015-01-30 DIAGNOSIS — R05 Cough: Secondary | ICD-10-CM

## 2015-01-30 DIAGNOSIS — J209 Acute bronchitis, unspecified: Secondary | ICD-10-CM

## 2015-01-30 DIAGNOSIS — G4733 Obstructive sleep apnea (adult) (pediatric): Secondary | ICD-10-CM

## 2015-01-30 MED ORDER — HYDROCOD POLST-CPM POLST ER 10-8 MG/5ML PO SUER: 5.0000 mL | Freq: Two times a day (BID) | ORAL | Status: DC | PRN

## 2015-01-30 MED ORDER — CEFDINIR 300 MG PO CAPS: 300.0000 mg | ORAL_CAPSULE | Freq: Two times a day (BID) | ORAL | Status: DC

## 2015-01-30 NOTE — Progress Notes (Signed)
[facility] [address], [place] [address] [phone]  Date:  01/30/2015   Name:  William Hebert   DOB:  07-Jun-1946   MRN:  [id number]  PCP:  Jenny Reichmann, MD    Chief Complaint: Cough and Shortness of Breath   History of Present Illness:  William Hebert is a 69 y.o. very pleasant male patient who presents with the following:  He is here today with "a severe cough for one week."   He is sometimes coughing up "just phlegm."   He has not noted a fever.   He has noted some sore throat.  He had one episode of emesis last week.  None since- states it was not related to emesis  He had some sort of antibiotic he says that was left over from something else - he says it was something called "fibertab;".  Turns out it was doxycycline that he took for 4 days.  He was taking it twice a day - stopped this yesterday.  He has OSA and uses CPAP machine at home.   Had his AAA repair a few years ago- this was discovered by luck on an unrelated imaging study  Patient Active Problem List   Diagnosis Date Noted  . AAA (abdominal aortic aneurysm) without rupture 07/10/2014  . Morbid obesity 03/27/2014  . Osteoarthritis of both knees 03/27/2014  . Hypogonadism in male 03/06/2014  . S/P AAA (abdominal aortic aneurysm) repair 07/04/2013  . Swelling of limb 01/31/2013  . Mixed dyslipidemia 06/16/2012  . Hypertension 06/16/2012  . Erectile dysfunction 06/16/2012  . Abdominal aneurysm without mention of rupture 12/29/2011  . Dyspnea 04/13/2011  . Obstructive sleep apnea 04/13/2011    Past Medical History  Diagnosis Date  . HTN (hypertension)   . GERD (gastroesophageal reflux disease)   . Testosterone deficiency   . Hyperlipidemia   . Morbidly obese   . Headache(784.0)   . Double vision   . Shortness of breath   . Arthritis   . Cancer     Past Surgical History  Procedure Laterality Date  . Knee surgery  x 2  . Umbilical hernia repair    . Abdominal aortic  aneurysm repair  05/20/11    aorto bifem. stent graft  . Foot tendon surgery  rt foot  . Skin surgery      3 different surgeries for melanoma  . Cataract extraction    . Mass excision N/A 05/10/2013    Procedure: EXCISION back MASS;  Surgeon: Joyice Faster. Cornett, MD;  Location: [place];  Service: General;  Laterality: N/A;  . Elbow surgery      History  Substance Use Topics  . Smoking status: Former Smoker -- 2.00 packs/day for 40 years    Types: Cigarettes    Quit date: 08/31/2005  . Smokeless tobacco: Former Systems developer  . Alcohol Use: No    Family History  Problem Relation Age of Onset  . Cancer Mother   . Heart disease Maternal Aunt     No Known Allergies  Medication list has been reviewed and updated.  Current Outpatient Prescriptions on File Prior to Visit  Medication Sig Dispense Refill  . aspirin 81 MG tablet Take 81 mg by mouth daily.      [name] azelastine (ASTELIN) 0.1 % nasal spray Place 2 sprays into both nostrils 2 (two) times daily. Use in each nostril as directed 30 mL 11  . eplerenone (INSPRA) 50 MG tablet  Take 50 mg by mouth daily.    . furosemide (LASIX) 40 MG tablet Take 1 tablet (40 mg total) by mouth daily. 90 tablet 1  . labetalol (NORMODYNE) 200 MG tablet Take 200 mg by mouth daily.    . methocarbamol (ROBAXIN) 500 MG tablet Take 1-2 tablets (500-1,000 mg total) by mouth 4 (four) times daily. 60 tablet 0  . Misc Natural Products (OSTEO BI-FLEX ADV DOUBLE ST PO) Take by mouth.      . Nebivolol HCl (BYSTOLIC) 20 MG TABS Take 1 tablet (20 mg total) by mouth 2 (two) times daily. 180 tablet 3  . NIFEDICAL XL 60 MG 24 hr tablet Take 1 tablet by mouth  daily 90 tablet 1  . omeprazole (PRILOSEC) 20 MG capsule Take 1 capsule by mouth two times daily 180 capsule 1  . PRESCRIPTION MEDICATION Testim 1% 1 packet taking everyday    . spironolactone (ALDACTONE) 25 MG tablet Take 25 mg by mouth daily.      [name] testosterone (ANDROGEL) 50 MG/5GM (1%) GEL Place 5 g  onto the skin daily. 90 Package 1   No current facility-administered medications on file prior to visit.    Review of Systems:  As per HPI- otherwise negative.   Physical Examination: Filed Vitals:   01/30/15 1515  BP: 122/66  Pulse: 66  Temp: 98.6 F (37 C)  Resp: 18   Filed Vitals:   01/30/15 1515  Height: 5\' 11"  (1.803 m)  Weight: 321 lb 9.6 oz (145.877 kg)   Body mass index is 44.87 kg/(m^2). Ideal Body Weight: Weight in (lb) to have BMI = 25: 178.9  GEN: WDWN, NAD, Non-toxic, A & O x 3, obese, coughing hard in room HEENT: Atraumatic, Normocephalic. Neck supple. No masses, No LAD.  Bilateral TM wnl, oropharynx normal.  PEERL,EOMI.   Ears and Nose: No external deformity. CV: RRR, No M/G/R. No JVD. No thrill. No extra heart sounds. PULM: CTA B, no wheezes, crackles, rhonchi. No retractions. No resp. distress. No accessory muscle use. ABD:   Rotund abdomen but no tenderness EXTR: No c/c/e NEURO Normal gait.  PSYCH: Normally interactive. Conversant. Not depressed or anxious appearing.  Calm demeanor.   [facility] reading (PRIMARY) by  Dr. Lorelei Pont. CXR: negative for acute infiltrate  CHEST 2 VIEW  COMPARISON: August 07, 2014.  FINDINGS: The heart size and mediastinal contours are within normal limits. Both lungs are clear. No pneumothorax or pleural effusion is noted. Bony thorax is intact.  IMPRESSION: No active cardiopulmonary disease.  Assessment and Plan: Acute bronchitis, unspecified organism - Plan: cefdinir (OMNICEF) 300 MG capsule  Cough - Plan: DG Chest 2 View, chlorpheniramine-HYDROcodone (TUSSIONEX PENNKINETIC ER) 10-8 MG/5ML SUER  Morbid obesity  OSA (obstructive sleep apnea) treat for persistent cough with omnicef, tussionex as needed  Follow- up if not better soon Cautioned regarding sedation with cough syrup   Signed Lamar Blinks, MD

## 2015-01-30 NOTE — Telephone Encounter (Signed)
Please contact [employer] to give more clinical information for patient to have an MRI of lumbar spine w/o contrast. The Original request per [facility] has expired. I have resubmitted a new request, please contact [employer] at [id number] case # [id number], [facility] ID # [id number] DOB 1946-08-09 CPT code 97989 diagnosis m50.10 and 554.2.

## 2015-01-30 NOTE — Patient Instructions (Signed)
Please let me know if you are not feeling better soon- sooner if you have a fever We are going to treat you for bronchitis with omnicef (antibiotic) and the cough syrup as needed.

## 2015-01-31 NOTE — Telephone Encounter (Signed)
Authorization number # [id number]

## 2015-02-07 ENCOUNTER — Other Ambulatory Visit: Payer: Self-pay | Admitting: Emergency Medicine

## 2015-02-07 DIAGNOSIS — M25511 Pain in right shoulder: Secondary | ICD-10-CM

## 2015-02-08 ENCOUNTER — Telehealth: Payer: Self-pay

## 2015-02-08 NOTE — Telephone Encounter (Signed)
Dr Everlene Farrier, did you call pt? Don't see where we called him.

## 2015-02-08 NOTE — Telephone Encounter (Signed)
Pt missed his CB. He thinks Dr. Everlene Farrier called him, he's not sure why he got a call. Please advise at [phone]

## 2015-02-08 NOTE — Telephone Encounter (Signed)
Patient was called to set him off for an MRI of his shoulder. This is probably the call he received. His wife had called requesting this be performed.

## 2015-02-10 ENCOUNTER — Ambulatory Visit
Admission: RE | Admit: 2015-02-10 | Discharge: 2015-02-10 | Disposition: A | Discharge status: Home / Self Care | Payer: Medicare Other | Source: Ambulatory Visit | Attending: Emergency Medicine | Admitting: Emergency Medicine

## 2015-02-10 DIAGNOSIS — M5010 Cervical disc disorder with radiculopathy, unspecified cervical region: Secondary | ICD-10-CM

## 2015-02-10 DIAGNOSIS — M542 Cervicalgia: Secondary | ICD-10-CM

## 2015-02-11 ENCOUNTER — Telehealth: Payer: Self-pay | Admitting: Emergency Medicine

## 2015-02-11 NOTE — Telephone Encounter (Signed)
Please call patient and give him the results of his MRI. Tell him I forwarded these results to Dr. Kellie Simmering for his review. I am waiting for his response.

## 2015-02-11 NOTE — Telephone Encounter (Signed)
Spoke with pt, advised message from Dr. Everlene Farrier. Pt is coming in tomorrow.

## 2015-02-12 ENCOUNTER — Encounter: Payer: Self-pay | Admitting: Emergency Medicine

## 2015-02-12 ENCOUNTER — Other Ambulatory Visit: Payer: Self-pay | Admitting: Emergency Medicine

## 2015-02-12 ENCOUNTER — Ambulatory Visit (INDEPENDENT_AMBULATORY_CARE_PROVIDER_SITE_OTHER): Payer: Medicare Other | Admitting: Emergency Medicine

## 2015-02-12 VITALS — BP 144/74 | HR 68 | Temp 98.0°F | Resp 16 | Ht 71.0 in | Wt 326.6 lb

## 2015-02-12 DIAGNOSIS — R05 Cough: Secondary | ICD-10-CM

## 2015-02-12 DIAGNOSIS — N529 Male erectile dysfunction, unspecified: Secondary | ICD-10-CM

## 2015-02-12 DIAGNOSIS — E785 Hyperlipidemia, unspecified: Secondary | ICD-10-CM

## 2015-02-12 DIAGNOSIS — E291 Testicular hypofunction: Secondary | ICD-10-CM

## 2015-02-12 DIAGNOSIS — R7989 Other specified abnormal findings of blood chemistry: Secondary | ICD-10-CM

## 2015-02-12 DIAGNOSIS — R059 Cough, unspecified: Secondary | ICD-10-CM

## 2015-02-12 DIAGNOSIS — M549 Dorsalgia, unspecified: Secondary | ICD-10-CM

## 2015-02-12 DIAGNOSIS — H8112 Benign paroxysmal vertigo, left ear: Secondary | ICD-10-CM | POA: Diagnosis not present

## 2015-02-12 LAB — CBC WITH DIFFERENTIAL/PLATELET
Basophils Absolute: 0.1 10*3/uL (ref 0.0–0.1)
Basophils Relative: 1 % (ref 0–1)
Eosinophils Absolute: 0.1 10*3/uL (ref 0.0–0.7)
Eosinophils Relative: 2 % (ref 0–5)
HCT: 42.4 % (ref 39.0–52.0)
Hemoglobin: 14 g/dL (ref 13.0–17.0)
Lymphocytes Relative: 17 % (ref 12–46)
Lymphs Abs: 1.2 10*3/uL (ref 0.7–4.0)
MCH: 29.5 pg (ref 26.0–34.0)
MCHC: 33 g/dL (ref 30.0–36.0)
MCV: 89.5 fL (ref 78.0–100.0)
MPV: 10.7 fL (ref 8.6–12.4)
Monocytes Absolute: 0.7 10*3/uL (ref 0.1–1.0)
Monocytes Relative: 10 % (ref 3–12)
Neutro Abs: 5 10*3/uL (ref 1.7–7.7)
Neutrophils Relative %: 70 % (ref 43–77)
Platelets: 193 10*3/uL (ref 150–400)
RBC: 4.74 MIL/uL (ref 4.22–5.81)
RDW: 13.9 % (ref 11.5–15.5)
WBC: 7.2 10*3/uL (ref 4.0–10.5)

## 2015-02-12 LAB — LIPID PANEL
Cholesterol: 135 mg/dL (ref 0–200)
HDL: 28 mg/dL — ABNORMAL LOW (ref >=40)
LDL Cholesterol: 60 mg/dL (ref 0–99)
Total CHOL/HDL Ratio: 4.8 Ratio
Triglycerides: 236 mg/dL — ABNORMAL HIGH (ref <150)
VLDL: 47 mg/dL — ABNORMAL HIGH (ref 0–40)

## 2015-02-12 MED ORDER — BENZONATATE 100 MG PO CAPS: 100.0000 mg | ORAL_CAPSULE | Freq: Three times a day (TID) | ORAL | Status: DC | PRN

## 2015-02-12 MED ORDER — DOXYCYCLINE HYCLATE 100 MG PO TABS: 100.0000 mg | ORAL_TABLET | Freq: Two times a day (BID) | ORAL | Status: DC

## 2015-02-12 NOTE — Patient Instructions (Signed)

## 2015-02-12 NOTE — Progress Notes (Addendum)
Subjective:  This chart was scribed for Darlyne Russian, MD by Tamsen Roers, at [facility].  This patient was seen in room 21 and the patient's care was started at 3:51 PM.    Patient ID: William Hebert, male    DOB: [birth year], 69 y.o.   MRN: [id number] Chief Complaint  Patient presents with  . Follow-up  . Hypertension  . Hyperlipidemia  . Cough    "better" but still have since 01/30/15 seen by Dr. Lorelei Pont    HPI  HPI Comments: William Hebert is a 69 y.o. male who presents to the [facility] for a follow up.   He saw Dr. Edilia Bo 01/30/15 for a cough.  he was treated with Omnicef for 10 days. He had a CBC and chest x ray done (which came back normal).  Cough: Per wife, patient had bronchitis and was seen here at [facility].  He was given the "strongest antibiotics" but states that his productive cough (white phlegm)  has not gone away.  His wife states that he has had a similar cough in the past which has made him pass out. She is afraid that this will happen again. He is requesting cough medication today.   Back: Patient had an MRI of his back (the radiologist put a note on the report wondering weather his symptoms could be related to his aneurism repair.) They have not yet heard back from Dr. Kellie Simmering.   Extremity Swelling: Patient has also had swelling in his ankles/feet which his wife states is "very bad."   Oral: Patient saw the dentist today and was told that he had an area of a collection of blood and was suspicious that it may be cancer.    Dizziness: Patient notes that he feels dizzy when he lays on his left side. He has not yet done any exercises that may alleviate his symptoms but his wife has vertigo and she is willing to show him what to do.   Patient Active Problem List   Diagnosis Date Noted  . AAA (abdominal aortic aneurysm) without rupture 07/10/2014  . Morbid obesity 03/27/2014  . Osteoarthritis of both knees 03/27/2014  .  Hypogonadism in male 03/06/2014  . S/P AAA (abdominal aortic aneurysm) repair 07/04/2013  . Swelling of limb 01/31/2013  . Mixed dyslipidemia 06/16/2012  . Hypertension 06/16/2012  . Erectile dysfunction 06/16/2012  . Abdominal aneurysm without mention of rupture 12/29/2011  . Dyspnea 04/13/2011  . Obstructive sleep apnea 04/13/2011   Past Medical History  Diagnosis Date  . HTN (hypertension)   . GERD (gastroesophageal reflux disease)   . Testosterone deficiency   . Hyperlipidemia   . Morbidly obese   . Headache(784.0)   . Double vision   . Shortness of breath   . Arthritis   . Cancer    Past Surgical History  Procedure Laterality Date  . Knee surgery  x 2  . Umbilical hernia repair    . Abdominal aortic aneurysm repair  05/20/11    aorto bifem. stent graft  . Foot tendon surgery  rt foot  . Skin surgery      3 different surgeries for melanoma  . Cataract extraction    . Mass excision N/A 05/10/2013    Procedure: EXCISION back MASS;  Surgeon: Joyice Faster. Cornett, MD;  Location: [place];  Service: General;  Laterality: N/A;  . Elbow surgery     No Known Allergies Prior to  Admission medications   Medication Sig Start Date End Date Taking? Authorizing Provider  aspirin 81 MG tablet Take 81 mg by mouth daily.     Yes Historical Provider, MD  azelastine (ASTELIN) 0.1 % nasal spray Place 2 sprays into both nostrils 2 (two) times daily. Use in each nostril as directed 08/07/14  Yes Darlyne Russian, MD  eplerenone (INSPRA) 50 MG tablet Take 50 mg by mouth daily.   Yes Historical Provider, MD  furosemide (LASIX) 40 MG tablet Take 1 tablet (40 mg total) by mouth daily.   Yes Darlyne Russian, MD  labetalol (NORMODYNE) 200 MG tablet Take 200 mg by mouth daily.   Yes Historical Provider, MD  methocarbamol (ROBAXIN) 500 MG tablet Take 1-2 tablets (500-1,000 mg total) by mouth 4 (four) times daily. 12/19/14  Yes Darlyne Russian, MD  Misc Natural Products (OSTEO BI-FLEX ADV  DOUBLE ST PO) Take by mouth.     Yes Historical Provider, MD  Nebivolol HCl (BYSTOLIC) 20 MG TABS Take 1 tablet (20 mg total) by mouth 2 (two) times daily. 08/15/13  Yes Darlyne Russian, MD  NIFEDICAL XL 60 MG 24 hr tablet Take 1 tablet by mouth  daily 10/22/14  Yes Darlyne Russian, MD  omeprazole (PRILOSEC) 20 MG capsule Take 1 capsule by mouth two times daily 10/22/14  Yes Darlyne Russian, MD  PRESCRIPTION MEDICATION Testim 1% 1 packet taking everyday   Yes Historical Provider, MD  spironolactone (ALDACTONE) 25 MG tablet Take 25 mg by mouth daily.     Yes Historical Provider, MD  testosterone (ANDROGEL) 50 MG/5GM (1%) GEL Place 5 g onto the skin daily. 09/11/14  Yes Darlyne Russian, MD   History   Social History  . Marital Status: Married    Spouse Name: N/A  . Number of Children: N/A  . Years of Education: N/A   Occupational History  . 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     Social History Main Topics  . Smoking status: Former Smoker -- 2.00 packs/day for 40 years    Types: Cigarettes    Quit date: 08/31/2005  . Smokeless tobacco: Former Systems developer  . Alcohol Use: No  . Drug Use: No  . Sexual Activity: Not on file   Other Topics Concern  . Not on file   Social History Narrative     Current Outpatient Prescriptions on File Prior to Visit  Medication Sig Dispense Refill  . aspirin 81 MG tablet Take 81 mg by mouth daily.      [name] azelastine (ASTELIN) 0.1 % nasal spray Place 2 sprays into both nostrils 2 (two) times daily. Use in each nostril as directed 30 mL 11  . eplerenone (INSPRA) 50 MG tablet Take 50 mg by mouth daily.    . furosemide (LASIX) 40 MG tablet Take 1 tablet (40 mg total) by mouth daily. 90 tablet 1  . labetalol (NORMODYNE) 200 MG tablet Take 200 mg by mouth daily.    . methocarbamol (ROBAXIN) 500 MG tablet Take 1-2 tablets (500-1,000 mg total) by mouth 4 (four) times daily. 60 tablet 0  . Misc Natural Products (OSTEO BI-FLEX ADV DOUBLE ST PO) Take by mouth.      . Nebivolol HCl (BYSTOLIC) 20 MG  TABS Take 1 tablet (20 mg total) by mouth 2 (two) times daily. 180 tablet 3  . NIFEDICAL XL 60 MG 24 hr tablet Take 1 tablet by mouth  daily 90 tablet 1  . omeprazole (PRILOSEC) 20 MG capsule Take 1  capsule by mouth two times daily 180 capsule 1  . PRESCRIPTION MEDICATION Testim 1% 1 packet taking everyday    . spironolactone (ALDACTONE) 25 MG tablet Take 25 mg by mouth daily.      [name] testosterone (ANDROGEL) 50 MG/5GM (1%) GEL Place 5 g onto the skin daily. 90 Package 1   No current facility-administered medications on file prior to visit.    No Known Allergies  Review of Systems  Constitutional: Negative for fever and chills.  Respiratory: Positive for cough.   Gastrointestinal: Negative for nausea and vomiting.  Neurological: Positive for dizziness. Negative for syncope and speech difficulty.       Objective:   Physical Exam CONSTITUTIONAL: Well developed/well nourished HEAD: Normocephalic/atraumatic EYES: EOMI/PERRL ENMT: Mucous membranes moist NECK: supple no meningeal signs SPINE/BACK:entire spine nontender CV: S1/S2 noted, no murmurs/rubs/gallops noted LUNGS: Lungs are clear to auscultation bilaterally, no apparent distress ABDOMEN: soft, nontender, no rebound or guarding, bowel sounds noted throughout abdomen GU:no cva tenderness NEURO: Pt is awake/alert/appropriate, moves all extremitiesx4.  No facial droop.   EXTREMITIES: pulses normal/equal, full ROM SKIN: warm, color normal PSYCH: no abnormalities of mood noted, alert and oriented to situation  Filed Vitals:   02/12/15 1538  BP: 144/74  Pulse: 68  Temp: 98 F (36.7 C)  TempSrc: Oral  Resp: 16  Height: 5\' 11"  (1.803 m)  Weight: 326 lb 9.6 oz (148.145 kg)  SpO2: 95%       Assessment & Plan:  1. Erectile dysfunction, unspecified erectile dysfunction type Patient feels bad when he takes ED drugs. I advised them testosterone levels will not have any effect on ED. the ED drugs causing to be lightheaded and he  does not want to take them anymore. He has talked to the urologist about this.  2. Low testosterone  - Testosterone, Free, Total,  - PSA, Medicare  3. Cough I placed him on doxycycline and Tessalon Perles for his productive cough. He did have a few doxycycline followed by course of Omnicef and continues to have symptoms. His chest x-ray that was done was normal - CBC with Differential/Platelet  4. Benign paroxysmal positional vertigo, left His wife does exercises for vertigo and they will Google on you to the exercises for [place]  5. Back pain without radiation He will follow-up with orthopedist regarding his back pain. A copy of the MRI report was sent to Dr. Kellie Simmering for his review regarding the comments from Dr. Maree Erie  6. Hyperlipidemia These may not be very accurate since patient ate approximately 2 hours previous - Lipid panel 7.Lesion mouth  He has a 4 x 4 millimeter pigmented area adjacent to his second molar upper jaw on the left. He is scheduled to see an oral surgeon about this. I personally performed the services described in this documentation, which was scribed in my presence. The recorded information has been reviewed and is accurate.  Arlyss Queen, MD  [facility]  02/12/2015 4:41 PM

## 2015-02-13 LAB — TESTOSTERONE, FREE, TOTAL, SHBG
Sex Hormone Binding: 31 nmol/L (ref 22–77)
Testosterone, Free: 43.7 pg/mL — ABNORMAL LOW (ref 47.0–244.0)
Testosterone-% Free: 2 % (ref 1.6–2.9)
Testosterone: 220 ng/dL — ABNORMAL LOW (ref 300–890)

## 2015-02-13 LAB — PSA, MEDICARE: PSA: 0.88 ng/mL (ref <=4.00)

## 2015-02-14 ENCOUNTER — Telehealth: Payer: Self-pay

## 2015-02-14 NOTE — Telephone Encounter (Signed)
error 

## 2015-02-14 NOTE — Progress Notes (Signed)
This encounter was created in error - please disregard.

## 2015-02-14 NOTE — Telephone Encounter (Signed)
Pt missed his CB, he is not sure why he got a call. It may concern his call from 6/10? Please advise at [phone]

## 2015-02-18 ENCOUNTER — Telehealth: Payer: Self-pay

## 2015-02-18 NOTE — Telephone Encounter (Signed)
Pt would like for someone to call him with the lab results from his visit on [id number]. Please try all of the numbers on file if you do not reach them at home.  Home # [phone] Cell # [phone]

## 2015-02-18 NOTE — Telephone Encounter (Signed)
Please review

## 2015-02-19 ENCOUNTER — Telehealth: Payer: Self-pay | Admitting: Emergency Medicine

## 2015-02-19 NOTE — Telephone Encounter (Signed)
Left message for pt to call back  °

## 2015-02-19 NOTE — Telephone Encounter (Signed)
Pt.notified

## 2015-02-19 NOTE — Telephone Encounter (Signed)
Please call patient. His testosterone level is low but better than his last test. He continues to have a high triglyceride and a low HDL this is weight related. PSA is normal

## 2015-02-21 ENCOUNTER — Other Ambulatory Visit: Payer: Self-pay | Admitting: Emergency Medicine

## 2015-02-21 ENCOUNTER — Encounter: Payer: Self-pay | Admitting: Family Medicine

## 2015-02-21 NOTE — Telephone Encounter (Signed)
Dr Everlene Farrier, pt was just here for check up, but don't see these meds/Dxs discussed. OK to RF?

## 2015-02-23 ENCOUNTER — Ambulatory Visit
Admission: RE | Admit: 2015-02-23 | Discharge: 2015-02-23 | Disposition: A | Discharge status: Home / Self Care | Payer: Medicare Other | Source: Ambulatory Visit | Attending: Emergency Medicine | Admitting: Emergency Medicine

## 2015-02-23 DIAGNOSIS — M25511 Pain in right shoulder: Secondary | ICD-10-CM

## 2015-02-24 ENCOUNTER — Telehealth: Payer: Self-pay | Admitting: Emergency Medicine

## 2015-02-24 NOTE — Telephone Encounter (Signed)
Call patient. He has arthritis in the before [facility]  joint and some mild tendinopathy. This would be treated with physical therapy or injection not surgery

## 2015-02-25 NOTE — Telephone Encounter (Signed)
Called pt, left message for pt to call back.

## 2015-02-26 NOTE — Telephone Encounter (Signed)
Spoke with pt, advised message from Dr. Daub. Pt understood. 

## 2015-04-14 ENCOUNTER — Telehealth: Payer: Self-pay | Admitting: Emergency Medicine

## 2015-04-16 ENCOUNTER — Other Ambulatory Visit: Payer: Self-pay | Admitting: *Deleted

## 2015-04-16 NOTE — Telephone Encounter (Signed)
Dr. Kellie Simmering office called about pts pravastatin.  We have him as not taking this med but the wife states that he is.  Can you review labs from June to see if we can refill his pravastatin and at what dose?  He is out.  The pharmacy I think will be loaning him some until you review.

## 2015-04-17 NOTE — Telephone Encounter (Signed)
Please refill the patient's pravastatin at the dosage he was on before. He needs to stay on the medication. Give him a 30 day supply with refills for one year.

## 2015-04-19 NOTE — Telephone Encounter (Signed)
Dr Everlene Farrier, the pt's dose Rxd by a provider in an inpatient setting was for one 40 mg tablet BID, which is requiring a PA. Pharm states that ins will probably pay for one 80 mg tab QD. Is it OK for [place] to change to that, or do you want me to try to get the BID dosing of 40 mg covered, and what reason would you want me to use for having to use lower dose BID?

## 2015-04-20 NOTE — Telephone Encounter (Signed)
He can take 80 mg once a day is fine

## 2015-04-22 MED ORDER — PRAVASTATIN SODIUM 80 MG PO TABS: 80.0000 mg | ORAL_TABLET | Freq: Every day | ORAL | Status: DC

## 2015-04-22 NOTE — Telephone Encounter (Signed)
Patient is calling for refill on pravatstation.  Approved by Dr. Everlene Farrier.  [facility].

## 2015-04-22 NOTE — Telephone Encounter (Signed)
No msg °

## 2015-04-22 NOTE — Telephone Encounter (Signed)
I sent in new Rx for 80 mg per Dr Everlene Farrier, but sent 90 day supply w/3 RFs for a year since it was mail order.

## 2015-04-22 NOTE — Telephone Encounter (Signed)
Sent in Rx for 80 mg (see notes under last ph mes) and called and advised wife. Discussed having pt come in right before the 90 days runs out to have lipids re-checked to make sure there will be no changes.

## 2015-05-30 ENCOUNTER — Other Ambulatory Visit: Payer: Self-pay | Admitting: Emergency Medicine

## 2015-06-04 ENCOUNTER — Encounter: Payer: Self-pay | Admitting: Emergency Medicine

## 2015-07-03 ENCOUNTER — Other Ambulatory Visit: Payer: Self-pay | Admitting: Vascular Surgery

## 2015-07-03 LAB — CREATININE, SERUM: Creat: 1.03 mg/dL (ref 0.70–1.25)

## 2015-07-09 ENCOUNTER — Encounter (INDEPENDENT_AMBULATORY_CARE_PROVIDER_SITE_OTHER): Payer: Self-pay

## 2015-07-09 ENCOUNTER — Ambulatory Visit
Admission: RE | Admit: 2015-07-09 | Discharge: 2015-07-09 | Disposition: A | Discharge status: Home / Self Care | Payer: Medicare Other | Source: Ambulatory Visit | Attending: Vascular Surgery | Admitting: Vascular Surgery

## 2015-07-09 DIAGNOSIS — I714 Abdominal aortic aneurysm, without rupture, unspecified: Secondary | ICD-10-CM

## 2015-07-09 MED ORDER — IOPAMIDOL (ISOVUE-370) INJECTION 76%
75.0000 mL | Freq: Once | INTRAVENOUS | Status: AC | PRN
  Administered 2015-07-09: 75 mL via INTRAVENOUS

## 2015-07-11 ENCOUNTER — Encounter: Payer: Self-pay | Admitting: Vascular Surgery

## 2015-07-16 ENCOUNTER — Ambulatory Visit (INDEPENDENT_AMBULATORY_CARE_PROVIDER_SITE_OTHER): Payer: Medicare Other | Admitting: Vascular Surgery

## 2015-07-16 ENCOUNTER — Other Ambulatory Visit (HOSPITAL_COMMUNITY): Payer: Medicare Other

## 2015-07-16 ENCOUNTER — Encounter: Payer: Self-pay | Admitting: Vascular Surgery

## 2015-07-16 VITALS — BP 126/72 | HR 64 | Temp 97.1°F | Resp 20 | Ht 70.0 in | Wt 332.0 lb

## 2015-07-16 DIAGNOSIS — I714 Abdominal aortic aneurysm, without rupture, unspecified: Secondary | ICD-10-CM

## 2015-07-16 NOTE — Addendum Note (Signed)
Addended by: Dorthula Rue L on: 07/16/2015 03:01 PM   Modules accepted: Orders

## 2015-07-16 NOTE — Progress Notes (Signed)
Subjective:     Patient ID: William Hebert, male   DOB: [birth year], 69 y.o.   MRN: [id number]  HPI this 69 year old male returns now 4 years post abdominal aortic stent graft for abdominal aortic aneurysm. He has done well from that standpoint. Aneurysm was approximately 7 cm at the time of his procedure. He has had no complications relative to it.  Past Medical History  Diagnosis Date  . HTN (hypertension)   . GERD (gastroesophageal reflux disease)   . Testosterone deficiency   . Hyperlipidemia   . Morbidly obese ([place])   . Headache(784.0)   . Double vision   . Shortness of breath   . Arthritis   . Cancer [facility])     Social History  Substance Use Topics  . Smoking status: Former Smoker -- 2.00 packs/day for 40 years    Types: Cigarettes    Quit date: 08/31/2005  . Smokeless tobacco: Former Systems developer  . Alcohol Use: No    Family History  Problem Relation Age of Onset  . Cancer Mother   . Heart disease Maternal Aunt     No Known Allergies   Current outpatient prescriptions:  .  aspirin 81 MG tablet, Take 81 mg by mouth daily.  , Disp: , Rfl:  .  azelastine (ASTELIN) 0.1 % nasal spray, Place 2 sprays into both nostrils 2 (two) times daily. Use in each nostril as directed, Disp: 30 mL, Rfl: 11 .  benzonatate (TESSALON) 100 MG capsule, Take 1-2 capsules (100-200 mg total) by mouth 3 (three) times daily as needed for cough., Disp: 40 capsule, Rfl: 0 .  doxycycline (VIBRA-TABS) 100 MG tablet, Take 1 tablet (100 mg total) by mouth 2 (two) times daily., Disp: 20 tablet, Rfl: 0 .  eplerenone (INSPRA) 50 MG tablet, Take 50 mg by mouth daily., Disp: , Rfl:  .  furosemide (LASIX) 40 MG tablet, Take 1 tablet by mouth  daily, Disp: 90 tablet, Rfl: 0 .  labetalol (NORMODYNE) 200 MG tablet, Take 200 mg by mouth daily., Disp: , Rfl:  .  methocarbamol (ROBAXIN) 500 MG tablet, Take 1-2 tablets (500-1,000 mg total) by mouth 4 (four) times daily., Disp: 60 tablet, Rfl: 0 .  Misc Natural Products  (OSTEO BI-FLEX ADV DOUBLE ST PO), Take by mouth.  , Disp: , Rfl:  .  Nebivolol HCl (BYSTOLIC) 20 MG TABS, Take 1 tablet (20 mg total) by mouth 2 (two) times daily., Disp: 180 tablet, Rfl: 3 .  NIFEDICAL XL 60 MG 24 hr tablet, Take 1 tablet by mouth  daily, Disp: 90 tablet, Rfl: 1 .  omeprazole (PRILOSEC) 20 MG capsule, Take 1 capsule by mouth two times daily, Disp: 180 capsule, Rfl: 1 .  potassium chloride SA (K-DUR,KLOR-CON) 20 MEQ tablet, Take 1 tablet by mouth  daily, Disp: 90 tablet, Rfl: 0 .  pravastatin (PRAVACHOL) 80 MG tablet, Take 1 tablet (80 mg total) by mouth daily., Disp: 90 tablet, Rfl: 3 .  PRESCRIPTION MEDICATION, Testim 1% 1 packet taking everyday, Disp: , Rfl:  .  spironolactone (ALDACTONE) 25 MG tablet, Take 25 mg by mouth daily.  , Disp: , Rfl:  .  testosterone (ANDROGEL) 50 MG/5GM (1%) GEL, Place 5 g onto the skin daily., Disp: 90 Package, Rfl: 1  Filed Vitals:   07/16/15 1126  BP: 126/72  Pulse: 64  Temp: 97.1 F (36.2 C)  TempSrc: Oral  Resp: 20  Height: 5\' 10"  (1.778 m)  Weight: 332 lb (150.594 kg)  SpO2: 95%  Body mass index is 47.64 kg/(m^2).           Review of Systems denies chest pain but does have dyspnea on exertion. Has continued to gain some weight does have morbid obesity. History of GERD O active symptoms. Has bilateral lower extremity edema with recent negative duplex scan bilaterally or by his medical doctor. Other systems negative and a complete review of systems     Objective:   Physical Exam BP 126/72 mmHg  Pulse 64  Temp(Src) 97.1 F (36.2 C) (Oral)  Resp 20  Ht 5\' 10"  (1.778 m)  Wt 332 lb (150.594 kg)  BMI 47.64 kg/m2  SpO2 95%  Gen.-alert and oriented x3 in no apparent distress-morbidly obese HEENT normal for age Lungs no rhonchi or wheezing Cardiovascular regular rhythm no murmurs carotid pulses 3+ palpable no bruits audible Abdomen soft nontender no palpable masses-obese Musculoskeletal free of  major  deformities Skin clear -no rashes Neurologic normal Lower extremities 3+ femoral and dorsalis pedis pulses palpable bilaterally with 1+ edema bilaterally  I ordered a CT angiogram of the abdomen and pelvis which I reviewed the images by computer and the report from the radiologist. The endograft is in excellent position with no evidence of endoleak. The diameter of the aneurysm has slightly decreased to 6.9 cm in maximum diameter. There is no evidence of migration of the graft or fracture of the stents.       Assessment:     Doing well 4 years post insertion aorto biiliac stent graft for large abdominal aortic aneurysm. No evidence of endoleak or problems with the graft. Ongoing obesity GERD     Plan:     Patient will return in 1 year. We will see if we can evaluate the stent graft using duplex scanning which may be difficult because of his size. He will return to see nurse practitioner in one year with duplex scan of the aortic stent graft

## 2015-07-19 ENCOUNTER — Other Ambulatory Visit: Payer: Self-pay | Admitting: Emergency Medicine

## 2015-08-24 ENCOUNTER — Ambulatory Visit (INDEPENDENT_AMBULATORY_CARE_PROVIDER_SITE_OTHER): Payer: Medicare Other | Admitting: Emergency Medicine

## 2015-08-24 VITALS — BP 150/80 | HR 70 | Temp 98.0°F | Resp 16 | Ht 72.0 in | Wt 330.0 lb

## 2015-08-24 DIAGNOSIS — J029 Acute pharyngitis, unspecified: Secondary | ICD-10-CM | POA: Diagnosis not present

## 2015-08-24 DIAGNOSIS — L723 Sebaceous cyst: Secondary | ICD-10-CM

## 2015-08-24 LAB — POCT RAPID STREP A (OFFICE): Rapid Strep A Screen: NEGATIVE

## 2015-08-24 MED ORDER — AMOXICILLIN 875 MG PO TABS: 875.0000 mg | ORAL_TABLET | Freq: Two times a day (BID) | ORAL | Status: DC

## 2015-08-24 NOTE — Progress Notes (Signed)
Patient ID: William Hebert, male   DOB: August 24, 1946, 69 y.o.   MRN: [id number]    By signing my name below, I, Essence Howell, attest that this documentation has been prepared under the direction and in the presence of Darlyne Russian, MD Electronically Signed: Ladene Artist, ED Scribe 08/24/2015 at 1:21 PM.  Chief Complaint:  Chief Complaint  Patient presents with  . Mass    marble size, neck, x 1 week   HPI: William Hebert is a 69 y.o. male who reports to [facility] today complaining of a marble sized mass to the back of his neck first noticed 1 week ago. Pt reports h/o a similar mass to the same area last year that required excision by a surgeon. He also presents with right submaxillary lymphadenopathy that is slightly tender to palpation. Pt states that he recently had dental work done earlier this week. He denies dental pain. No treatments tried PTA. No known medical allergies.   Past Medical History  Diagnosis Date  . HTN (hypertension)   . GERD (gastroesophageal reflux disease)   . Testosterone deficiency   . Hyperlipidemia   . Morbidly obese ([place])   . Headache(784.0)   . Double vision   . Shortness of breath   . Arthritis   . Cancer [facility])    Past Surgical History  Procedure Laterality Date  . Knee surgery  x 2  . Umbilical hernia repair    . Abdominal aortic aneurysm repair  05/20/11    aorto bifem. stent graft  . Foot tendon surgery  rt foot  . Skin surgery      3 different surgeries for melanoma  . Cataract extraction    . Mass excision N/A 05/10/2013    Procedure: EXCISION back MASS;  Surgeon: Joyice Faster. Cornett, MD;  Location: [place];  Service: General;  Laterality: N/A;  . Elbow surgery     Social History   Social History  . Marital Status: Married    Spouse Name: N/A  . Number of Children: N/A  . Years of Education: N/A   Occupational History  . 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     Social History Main Topics  . Smoking status: Former Smoker -- 2.00 packs/day  for 40 years    Types: Cigarettes    Quit date: 08/31/2005  . Smokeless tobacco: Former Systems developer  . Alcohol Use: No  . Drug Use: No  . Sexual Activity: Not Asked   Other Topics Concern  . None   Social History Narrative   Family History  Problem Relation Age of Onset  . Cancer Mother   . Heart disease Maternal Aunt    No Known Allergies Prior to Admission medications   Medication Sig Start Date End Date Taking? Authorizing Provider  aspirin 81 MG tablet Take 81 mg by mouth daily.     Yes Historical Provider, MD  azelastine (ASTELIN) 0.1 % nasal spray Place 2 sprays into both nostrils 2 (two) times daily. Use in each nostril as directed 08/07/14  Yes Darlyne Russian, MD  benzonatate (TESSALON) 100 MG capsule Take 1-2 capsules (100-200 mg total) by mouth 3 (three) times daily as needed for cough. 02/12/15  Yes Darlyne Russian, MD  doxycycline (VIBRA-TABS) 100 MG tablet Take 1 tablet (100 mg total) by mouth 2 (two) times daily. 02/12/15  Yes Darlyne Russian, MD  eplerenone (INSPRA) 50 MG tablet Take 50 mg by mouth daily.   Yes Historical Provider, MD  furosemide (LASIX)  40 MG tablet TAKE 1 TABLET BY MOUTH DAILY 07/19/15  Yes Darlyne Russian, MD  labetalol (NORMODYNE) 200 MG tablet Take 200 mg by mouth daily.   Yes Historical Provider, MD  methocarbamol (ROBAXIN) 500 MG tablet Take 1-2 tablets (500-1,000 mg total) by mouth 4 (four) times daily. 12/19/14  Yes Darlyne Russian, MD  Misc Natural Products (OSTEO BI-FLEX ADV DOUBLE ST PO) Take by mouth.     Yes Historical Provider, MD  Nebivolol HCl (BYSTOLIC) 20 MG TABS Take 1 tablet (20 mg total) by mouth 2 (two) times daily. 08/15/13  Yes Darlyne Russian, MD  NIFEDICAL XL 60 MG 24 hr tablet Take 1 tablet by mouth  daily 02/22/15  Yes Darlyne Russian, MD  omeprazole (PRILOSEC) 20 MG capsule Take 1 capsule by mouth two times daily 02/22/15  Yes Darlyne Russian, MD  potassium chloride SA (K-DUR,KLOR-CON) 20 MEQ tablet Take 1 tablet by mouth  daily 05/31/15  Yes Darlyne Russian, MD  pravastatin (PRAVACHOL) 80 MG tablet Take 1 tablet (80 mg total) by mouth daily. 04/22/15  Yes Darlyne Russian, MD  PRESCRIPTION MEDICATION Testim 1% 1 packet taking everyday   Yes Historical Provider, MD  spironolactone (ALDACTONE) 25 MG tablet Take 25 mg by mouth daily.     Yes Historical Provider, MD  testosterone (ANDROGEL) 50 MG/5GM (1%) GEL Place 5 g onto the skin daily. 09/11/14  Yes Darlyne Russian, MD   ROS: The patient denies fevers, chills, night sweats, unintentional weight loss, dental pain, chest pain, palpitations, wheezing, dyspnea on exertion, nausea, vomiting, abdominal pain, dysuria, hematuria, melena, numbness, weakness, or tingling. +mass  All other systems have been reviewed and were otherwise negative with the exception of those mentioned in the HPI and as above.    PHYSICAL EXAM: Filed Vitals:   08/24/15 1218  BP: 150/80  Pulse: 70  Temp: 98 F (36.7 C)  Resp: 16   Body mass index is 44.75 kg/(m^2).  General: Alert, no acute distress HEENT:  Normocephalic, atraumatic, oropharynx patent. Neck: 1x1 cm cystic area over the posterior neck on the R Eye: EOMI, [facility] Cardiovascular:  Regular rate and rhythm, no rubs murmurs or gallops. No Carotid bruits, radial pulse intact. No pedal edema.  Respiratory: Clear to auscultation bilaterally. No wheezes, rales, or rhonchi. No cyanosis, no use of accessory musculature Abdominal: No organomegaly, abdomen is soft and non-tender, positive bowel sounds. No masses. Musculoskeletal: Gait intact. No edema, tenderness Skin: No rashes. Neurologic: Facial musculature symmetric. Psychiatric: Patient acts appropriately throughout our interaction. Lymphatic: Minimal discomfort of the R submaxillary gland. No other adenopathy.   LABS: Results for orders placed or performed in visit on 08/24/15  POCT rapid strep A  Result Value Ref Range   Rapid Strep A Screen Negative Negative   EKG/XRAY:   Primary read interpreted by Dr.  Everlene Farrier at [facility].   ASSESSMENT/PLAN: ake the amoxicillin for 10 days. Swish her mouth out with water twice a day. Please follow-up with your dermatologist regarding the cyst on your back.I personally performed the services described in this documentation, which was scribed in my presence. The recorded information has been reviewed and is accurate.    Gross sideeffects, risk and benefits, and alternatives of medications d/w patient. Patient is aware that all medications have potential sideeffects and we are unable to predict every sideeffect or drug-drug interaction that may occur.  Arlyss Queen MD 08/24/2015 1:10 PM

## 2015-09-10 ENCOUNTER — Encounter: Payer: Self-pay | Admitting: Emergency Medicine

## 2015-09-10 ENCOUNTER — Other Ambulatory Visit: Payer: Self-pay | Admitting: Emergency Medicine

## 2015-09-10 ENCOUNTER — Ambulatory Visit (INDEPENDENT_AMBULATORY_CARE_PROVIDER_SITE_OTHER): Payer: Medicare Other | Admitting: Emergency Medicine

## 2015-09-10 ENCOUNTER — Encounter: Payer: Medicare Other | Admitting: Emergency Medicine

## 2015-09-10 VITALS — BP 124/75 | HR 60 | Temp 97.9°F | Resp 18 | Ht 72.0 in | Wt 329.0 lb

## 2015-09-10 DIAGNOSIS — Z125 Encounter for screening for malignant neoplasm of prostate: Secondary | ICD-10-CM | POA: Diagnosis not present

## 2015-09-10 DIAGNOSIS — E291 Testicular hypofunction: Secondary | ICD-10-CM | POA: Diagnosis not present

## 2015-09-10 DIAGNOSIS — Z1159 Encounter for screening for other viral diseases: Secondary | ICD-10-CM | POA: Diagnosis not present

## 2015-09-10 DIAGNOSIS — Z23 Encounter for immunization: Secondary | ICD-10-CM

## 2015-09-10 DIAGNOSIS — R7989 Other specified abnormal findings of blood chemistry: Secondary | ICD-10-CM

## 2015-09-10 DIAGNOSIS — G4733 Obstructive sleep apnea (adult) (pediatric): Secondary | ICD-10-CM

## 2015-09-10 DIAGNOSIS — N529 Male erectile dysfunction, unspecified: Secondary | ICD-10-CM | POA: Diagnosis not present

## 2015-09-10 DIAGNOSIS — Z0000 Encounter for general adult medical examination without abnormal findings: Secondary | ICD-10-CM | POA: Diagnosis not present

## 2015-09-10 DIAGNOSIS — E785 Hyperlipidemia, unspecified: Secondary | ICD-10-CM | POA: Diagnosis not present

## 2015-09-10 LAB — CBC WITH DIFFERENTIAL/PLATELET
Basophils Absolute: 0.1 10*3/uL (ref 0.0–0.1)
Basophils Relative: 1 % (ref 0–1)
Eosinophils Absolute: 0.1 10*3/uL (ref 0.0–0.7)
Eosinophils Relative: 2 % (ref 0–5)
HCT: 42.1 % (ref 39.0–52.0)
Hemoglobin: 14.3 g/dL (ref 13.0–17.0)
Lymphocytes Relative: 19 % (ref 12–46)
Lymphs Abs: 1.3 10*3/uL (ref 0.7–4.0)
MCH: 30.2 pg (ref 26.0–34.0)
MCHC: 34 g/dL (ref 30.0–36.0)
MCV: 89 fL (ref 78.0–100.0)
MPV: 9.5 fL (ref 8.6–12.4)
Monocytes Absolute: 0.7 10*3/uL (ref 0.1–1.0)
Monocytes Relative: 10 % (ref 3–12)
Neutro Abs: 4.8 10*3/uL (ref 1.7–7.7)
Neutrophils Relative %: 68 % (ref 43–77)
Platelets: 195 10*3/uL (ref 150–400)
RBC: 4.73 MIL/uL (ref 4.22–5.81)
RDW: 13.8 % (ref 11.5–15.5)
WBC: 7 10*3/uL (ref 4.0–10.5)

## 2015-09-10 LAB — POCT URINALYSIS DIP (MANUAL ENTRY)
Bilirubin, UA: NEGATIVE
Glucose, UA: NEGATIVE
Ketones, POC UA: NEGATIVE
Leukocytes, UA: NEGATIVE
Nitrite, UA: NEGATIVE
Protein Ur, POC: 100 — AB
Spec Grav, UA: 1.015
Urobilinogen, UA: 0.2
pH, UA: 6

## 2015-09-10 LAB — LIPID PANEL
Cholesterol: 138 mg/dL (ref 125–200)
HDL: 31 mg/dL — ABNORMAL LOW (ref >=40)
LDL Cholesterol: 61 mg/dL (ref <130)
Total CHOL/HDL Ratio: 4.5 Ratio (ref <=5.0)
Triglycerides: 229 mg/dL — ABNORMAL HIGH (ref <150)
VLDL: 46 mg/dL — ABNORMAL HIGH (ref <30)

## 2015-09-10 LAB — COMPLETE METABOLIC PANEL WITH GFR
ALT: 20 U/L (ref 9–46)
AST: 21 U/L (ref 10–35)
Albumin: 3.9 g/dL (ref 3.6–5.1)
Alkaline Phosphatase: 79 U/L (ref 40–115)
BUN: 13 mg/dL (ref 7–25)
CO2: 25 mmol/L (ref 20–31)
Calcium: 8.9 mg/dL (ref 8.6–10.3)
Chloride: 102 mmol/L (ref 98–110)
Creat: 1.06 mg/dL (ref 0.70–1.25)
GFR, Est African American: 84 mL/min (ref >=60)
GFR, Est Non African American: 72 mL/min (ref >=60)
Glucose, Bld: 95 mg/dL (ref 65–99)
Potassium: 3.5 mmol/L (ref 3.5–5.3)
Sodium: 139 mmol/L (ref 135–146)
Total Bilirubin: 0.4 mg/dL (ref 0.2–1.2)
Total Protein: 6 g/dL — ABNORMAL LOW (ref 6.1–8.1)

## 2015-09-10 LAB — POC MICROSCOPIC URINALYSIS (UMFC): Mucus: ABSENT

## 2015-09-10 NOTE — Progress Notes (Signed)
By signing my name below, I, Judithe Hebert, attest that this documentation has been prepared under the direction and in the presence of William Jordan, MD. Electronically Signed: Judithe Hebert, ER Scribe. 09/10/2015. 1:38 PM.   Chief Complaint:  Chief Complaint  Patient presents with  . Annual Exam    HPI: William Hebert is a 70 y.o. male who reports to [facility] today for an annual physical. 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 He and his wife have been doing research on possible mineral deficiencies which can contribute to ED. He does not take a multivitamin. He states he is working, though he does not want to. He needs the money. He denies CP or abdominal pain. His knee continued to hurt, but he does not want a surgery. He has seen the opthomologist regularly. He sees a vascular doctor once per year. His colonoscopy was eight years ago, and at that time he was told to report back in 10 years. He has used to use chewing tobacco, but quit 10 years ago.    Past Medical History  Diagnosis Date  . HTN (hypertension)   . GERD (gastroesophageal reflux disease)   . Testosterone deficiency   . Hyperlipidemia   . Morbidly obese ([place])   . Headache(784.0)   . Double vision   . Shortness of breath   . Arthritis   . Cancer [facility])    Past Surgical History  Procedure Laterality Date  . Knee surgery  x 2  . Umbilical hernia repair    . Abdominal aortic aneurysm repair  05/20/11    aorto bifem. stent graft  . Foot tendon surgery  rt foot  . Skin surgery      3 different surgeries for melanoma  . Cataract extraction    . Mass excision N/A 05/10/2013    Procedure: EXCISION back MASS;  Surgeon: Joyice Faster. Cornett, MD;  Location: [place];  Service: General;  Laterality: N/A;  . Elbow surgery     Social History   Social History  . Marital Status: Married    Spouse Name: N/A  . Number of Children: N/A  . Years of Education: N/A   Occupational History  . 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      Social History Main Topics  . Smoking status: Former Smoker -- 2.00 packs/day for 40 years    Types: Cigarettes    Quit date: 08/31/2005  . Smokeless tobacco: Former Systems developer  . Alcohol Use: No  . Drug Use: No  . Sexual Activity: Not Asked   Other Topics Concern  . None   Social History Narrative   Family History  Problem Relation Age of Onset  . Cancer Mother   . Heart disease Maternal Aunt    No Known Allergies Prior to Admission medications   Medication Sig Start Date End Date Taking? Authorizing Provider  aspirin 81 MG tablet Take 81 mg by mouth daily.     Yes Historical Provider, MD  eplerenone (INSPRA) 50 MG tablet Take 50 mg by mouth daily.   Yes Historical Provider, MD  furosemide (LASIX) 40 MG tablet TAKE 1 TABLET BY MOUTH DAILY 07/19/15  Yes Darlyne Russian, MD  labetalol (NORMODYNE) 200 MG tablet Take 200 mg by mouth daily.   Yes Historical Provider, MD  Misc Natural Products (OSTEO BI-FLEX ADV DOUBLE ST PO) Take by mouth.     Yes Historical Provider, MD  NIFEDICAL XL 60 MG 24 hr tablet Take 1 tablet by mouth  daily 02/22/15  Yes Darlyne Russian, MD  omeprazole (PRILOSEC) 20 MG capsule Take 1 capsule by mouth two times daily 02/22/15  Yes Darlyne Russian, MD  potassium chloride SA (K-DUR,KLOR-CON) 20 MEQ tablet Take 1 tablet by mouth  daily 05/31/15  Yes Darlyne Russian, MD  pravastatin (PRAVACHOL) 80 MG tablet Take 1 tablet (80 mg total) by mouth daily. 04/22/15  Yes Darlyne Russian, MD  PRESCRIPTION MEDICATION Testim 1% 1 packet taking everyday   Yes Historical Provider, MD  spironolactone (ALDACTONE) 25 MG tablet Take 25 mg by mouth daily.     Yes Historical Provider, MD  testosterone (ANDROGEL) 50 MG/5GM (1%) GEL Place 5 g onto the skin daily. 09/11/14  Yes Darlyne Russian, MD  amoxicillin (AMOXIL) 875 MG tablet Take 1 tablet (875 mg total) by mouth 2 (two) times daily. Patient not taking: Reported on 09/10/2015 08/24/15   Darlyne Russian, MD  benzonatate (TESSALON) 100 MG capsule Take  1-2 capsules (100-200 mg total) by mouth 3 (three) times daily as needed for cough. Patient not taking: Reported on 09/10/2015 02/12/15   Darlyne Russian, MD  doxycycline (VIBRA-TABS) 100 MG tablet Take 1 tablet (100 mg total) by mouth 2 (two) times daily. Patient not taking: Reported on 09/10/2015 02/12/15   Darlyne Russian, MD  methocarbamol (ROBAXIN) 500 MG tablet Take 1-2 tablets (500-1,000 mg total) by mouth 4 (four) times daily. Patient not taking: Reported on 09/10/2015 12/19/14   Darlyne Russian, MD  Nebivolol HCl (BYSTOLIC) 20 MG TABS Take 1 tablet (20 mg total) by mouth 2 (two) times daily. Patient not taking: Reported on 09/10/2015 08/15/13   Darlyne Russian, MD    ROS: The patient denies fevers, chills, night sweats, unintentional weight loss, chest pain, palpitations, wheezing, dyspnea on exertion, nausea, vomiting, abdominal pain, dysuria, hematuria, melena, numbness, weakness, or tingling.   All other systems have been reviewed and were otherwise negative with the exception of those mentioned in the HPI and as above.    PHYSICAL EXAM: Filed Vitals:   09/10/15 1326  BP: 124/75  Pulse: 60  Temp: 97.9 F (36.6 C)  Resp: 18   Body mass index is 44.61 kg/(m^2).   General: Alert, no acute distress. Morbidly obese male.  HEENT:  Normocephalic, atraumatic, oropharynx patent. Eye: Juliette Mangle [facility] Cardiovascular:  Regular rate and rhythm, no rubs murmurs or gallops.  No Carotid bruits, radial pulse intact. No pedal edema.  Respiratory: Clear to auscultation bilaterally.  No wheezes, rales, or rhonchi.  No cyanosis, no use of accessory musculature Abdominal: No organomegaly, abdomen is soft and non-tender, positive bowel sounds.  No masses. Musculoskeletal: Gait intact. No edema, tenderness Skin: No rashes. Healed scar over the left upper back.  Neurologic: Facial musculature symmetric. Psychiatric: Patient acts appropriately throughout our interaction. Lymphatic: No cervical or submandibular  lymphadenopathy Anorectal: Prostate exam normal   LABS:   EKG/XRAY:   Primary read interpreted by Dr. Everlene Farrier at [facility].   ASSESSMENT/PLAN: Patient looks good. He is currently taking all of his medications. He has regular visits to his vascular surgeon. Obesity is his main issue and he needs to work on this. His wife is concerned about ED and requested vitamin D and zinc levels to be done which were performed. Patient given 23 vaccine today.  Gross sideeffects, risk and benefits, and alternatives of medications d/w patient. Patient is aware that all medications have potential sideeffects and we are unable to predict every sideeffect or drug-drug interaction  that may occur.  Arlyss Queen MD 09/10/2015 1:37 PM

## 2015-09-11 LAB — HEPATITIS C ANTIBODY: HCV Ab: NEGATIVE

## 2015-09-11 LAB — VITAMIN D 25 HYDROXY (VIT D DEFICIENCY, FRACTURES): Vit D, 25-Hydroxy: 23 ng/mL — ABNORMAL LOW (ref 30–100)

## 2015-09-11 LAB — PSA, MEDICARE: PSA: 1.3 ng/mL (ref <=4.00)

## 2015-09-13 ENCOUNTER — Telehealth: Payer: Self-pay

## 2015-09-13 NOTE — Telephone Encounter (Signed)
Pt is calling back about his lab resuls states we called him two days ago

## 2015-09-14 LAB — ZINC: Zinc: 52 ug/dL — ABNORMAL LOW (ref 60–130)

## 2015-09-14 NOTE — Telephone Encounter (Signed)
Pt called again requesting lab results to be reviewed with him. Please call ASAP.

## 2015-09-15 NOTE — Telephone Encounter (Signed)
Pt.notified

## 2015-09-16 LAB — TESTOSTERONE TOTAL,FREE,BIO, MALES
Albumin: 3.9 g/dL (ref 3.6–5.1)
Sex Hormone Binding: 33 nmol/L (ref 22–77)
Testosterone, Bioavailable: 41.3 ng/dL — ABNORMAL LOW (ref 130.5–681.7)
Testosterone, Free: 23 pg/mL — ABNORMAL LOW (ref 47.0–244.0)
Testosterone: 182 ng/dL — ABNORMAL LOW (ref 250–827)

## 2015-09-30 ENCOUNTER — Other Ambulatory Visit: Payer: Self-pay | Admitting: Emergency Medicine

## 2015-12-23 ENCOUNTER — Other Ambulatory Visit: Payer: Self-pay | Admitting: Emergency Medicine

## 2016-01-23 ENCOUNTER — Ambulatory Visit (INDEPENDENT_AMBULATORY_CARE_PROVIDER_SITE_OTHER): Payer: Medicare Other

## 2016-01-23 ENCOUNTER — Ambulatory Visit (INDEPENDENT_AMBULATORY_CARE_PROVIDER_SITE_OTHER): Payer: Medicare Other | Admitting: Family Medicine

## 2016-01-23 VITALS — BP 138/80 | HR 61 | Temp 98.4°F | Resp 18 | Ht 72.0 in | Wt 334.2 lb

## 2016-01-23 DIAGNOSIS — S8002XA Contusion of left knee, initial encounter: Secondary | ICD-10-CM | POA: Diagnosis not present

## 2016-01-23 DIAGNOSIS — M25562 Pain in left knee: Secondary | ICD-10-CM

## 2016-01-23 MED ORDER — DICLOFENAC SODIUM 1 % TD GEL
2.0000 g | Freq: Four times a day (QID) | TRANSDERMAL | Status: DC
Start: 2016-01-23 — End: 2017-02-19

## 2016-01-23 NOTE — Progress Notes (Signed)
Subjective:    Patient ID: William Hebert, male    DOB: Aug 04, 1946, 70 y.o.   MRN: [id number]  01/23/2016  Knee Pain   HPI This 70 y.o. male presents for evaluation of L knee pain.  Known B osteoarthritis.  Walking down handicapped rampe; stepped straight and fell.  Twisted knee L; popping.  If stretches/extends for past 1.5 weeks.  Refuses to undergo TKR R.  Unable to walk.  Occurs 2.5 weeks ago.  No swelling.  Severe pain.  No giving out.  No popping.  Tylenol some.  Limping.  Icing some.  s/p synthetic injection in R knee.   Review of Systems  Constitutional: Negative for fever, chills, diaphoresis, activity change, appetite change and fatigue.  Respiratory: Negative for cough and shortness of breath.   Cardiovascular: Negative for chest pain, palpitations and leg swelling.  Gastrointestinal: Negative for nausea, vomiting, abdominal pain and diarrhea.  Endocrine: Negative for cold intolerance, heat intolerance, polydipsia, polyphagia and polyuria.  Musculoskeletal: Positive for arthralgias and gait problem.  Skin: Negative for color change, rash and wound.  Neurological: Negative for dizziness, tremors, seizures, syncope, facial asymmetry, speech difficulty, weakness, light-headedness, numbness and headaches.  Psychiatric/Behavioral: Negative for sleep disturbance and dysphoric mood. The patient is not nervous/anxious.     Past Medical History  Diagnosis Date  . HTN (hypertension)   . GERD (gastroesophageal reflux disease)   . Testosterone deficiency   . Hyperlipidemia   . Morbidly obese ([place])   . Headache(784.0)   . Double vision   . Shortness of breath   . Arthritis   . Cancer [facility])    Past Surgical History  Procedure Laterality Date  . Knee surgery  x 2  . Umbilical hernia repair    . Abdominal aortic aneurysm repair  05/20/11    aorto bifem. stent graft  . Foot tendon surgery  rt foot  . Skin surgery      3 different surgeries for melanoma  . Cataract extraction     . Mass excision N/A 05/10/2013    Procedure: EXCISION back MASS;  Surgeon: Joyice Faster. Cornett, MD;  Location: [place];  Service: General;  Laterality: N/A;  . Elbow surgery     No Known Allergies  Social History   Social History  . Marital Status: Married    Spouse Name: N/A  . Number of Children: N/A  . Years of Education: N/A   Occupational History  . 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     Social History Main Topics  . Smoking status: Former Smoker -- 2.00 packs/day for 40 years    Types: Cigarettes    Quit date: 08/31/2005  . Smokeless tobacco: Former Systems developer  . Alcohol Use: No  . Drug Use: No  . Sexual Activity:    Partners: Female   Other Topics Concern  . Not on file   Social History Narrative   Family History  Problem Relation Age of Onset  . Cancer Mother   . Heart disease Maternal Aunt        Objective:    BP 138/80 mmHg  Pulse 61  Temp(Src) 98.4 F (36.9 C) (Oral)  Resp 18  Ht 6' (1.829 m)  Wt 334 lb 3.2 oz (151.592 kg)  BMI 45.32 kg/m2  SpO2 94% Physical Exam  Constitutional: He is oriented to person, place, and time. He appears well-developed and well-nourished. No distress.  HENT:  Head: Normocephalic and atraumatic.  Right Ear: External ear normal.  Left Ear: External  ear normal.  Nose: Nose normal.  Mouth/Throat: Oropharynx is clear and moist.  Eyes: Conjunctivae and EOM are normal. Pupils are equal, round, and reactive to light.  Neck: Normal range of motion. Neck supple. Carotid bruit is not present. No thyromegaly present.  Cardiovascular: Normal rate, regular rhythm, normal heart sounds and intact distal pulses.  Exam reveals no gallop and no friction rub.   No murmur heard. Pulmonary/Chest: Effort normal and breath sounds normal. He has no wheezes. He has no rales.  Abdominal: Soft. Bowel sounds are normal. He exhibits no distension and no mass. There is no tenderness. There is no rebound and no guarding.  Musculoskeletal:       Left  knee: He exhibits decreased range of motion. He exhibits no swelling, no effusion and normal meniscus. Tenderness found. Medial joint line and lateral joint line tenderness noted. No patellar tendon tenderness noted.  Lymphadenopathy:    He has no cervical adenopathy.  Neurological: He is alert and oriented to person, place, and time. No cranial nerve deficit.  Skin: Skin is warm and dry. No rash noted. He is not diaphoretic.  Psychiatric: He has a normal mood and affect. His behavior is normal.  Nursing note and vitals reviewed.       Assessment & Plan:   1. Knee contusion, left, initial encounter   2. Lateral knee pain, left     Orders Placed This Encounter  Procedures  . DG Knee Complete 4 Views Left    Standing Status: Future     Number of Occurrences: 1     Standing Expiration Date: 01/22/2017    Order Specific Question:  Reason for Exam (SYMPTOM  OR DIAGNOSIS REQUIRED)    Answer:  L lateral knee pain    Order Specific Question:  Preferred imaging location?    Answer:  External   Meds ordered this encounter  Medications  . diclofenac sodium (VOLTAREN) 1 % GEL    Sig: Apply 2 g topically 4 (four) times daily.    Dispense:  100 g    Refill:  1    No Follow-up on file.    Lailoni Baquera Elayne Guerin, M.D. [facility] [address] [place], [place]  [address] [phone] phone [phone] fax

## 2016-01-23 NOTE — Patient Instructions (Addendum)
   IF you received an x-ray today, you will receive an invoice from [facility]. Please contact [facility] at [phone] with questions or concerns regarding your invoice.   IF you received labwork today, you will receive an invoice from [facility]. Please contact [facility] at [phone] with questions or concerns regarding your invoice.   Our billing staff will not be able to assist you with questions regarding bills from these companies.  You will be contacted with the lab results as soon as they are available. The fastest way to get your results is to activate your My Chart account. Instructions are located on the last page of this paperwork. If you have not heard from us regarding the results in 2 weeks, please contact this office.    Lateral Collateral Knee Ligament Sprain With Phase I Rehab The lateral collateral ligament (LCL) of the knee helps hold the knee joint in proper alignment and prevents the bones from shifting out of alignment (displacing) toward the outside (laterally). Injury to the knee may cause a tear in the LCL ligament (sprain). The LCL is the least common ligament of the knee to be injured. Sprains may heal on their own, but they often result in a loose joint. Sprains are classified into three categories. Grade 1 sprains cause pain, but the tendon is not lengthened. Grade 2 sprains include a lengthened ligament, due to the ligament being stretched or partially ruptured. With grade 2 sprains there is still function, although the function may be decreased. Grade 3 sprains involve a complete tear of the tendon or muscle, and function is usually impaired. SYMPTOMS   Pain and tenderness on the outer side of the knee.  A "pop," tearing, or pulling sensation at the time of injury.  Bruising (contusion) at the site of injury within 48 hours of injury.  Knee stiffness.  Limping, often walking with the knee bent. CAUSES  An  LCL sprain occurs when a force is placed on the ligament that is greater than it can handle. Common causes of injury include:  Direct hit (trauma) to the inner side of the knee, especially if the foot is planted on the ground.  Forceful pivoting of the body and leg while the foot is planted on the ground. RISK INCREASES WITH:  Contact sports (football, rugby).  Sports that require pivoting or cutting (soccer).  Poor knee strength and flexibility.  Improper equipment use. PREVENTION   Warm up and stretch properly before activity.  Maintain physical fitness:  Strength, flexibility, and endurance.  Cardiovascular fitness.  Wear properly fitted protective equipment (correct length of cleats for surface).  Functional braces may be effective in preventing injury. PROGNOSIS  If treated properly, LCL tears usually heal on their own. Sometimes, surgery is required. RELATED COMPLICATIONS   Frequently recurring symptoms, such as knee giving way, instability, and swelling.  Injury to other structures in the knee joint.  Meniscal cartilage, resulting in locking and swelling of the knee.  Articular cartilage, resulting in knee arthritis.  Other ligaments of the knee (commonly).  Injury to nerves, causing numbness of the outer leg, foot, and ankle and weakness or paralysis, with inability to raise the ankle, big toe, or lesser toes.  Knee stiffness (loss of knee motion). TREATMENT  Treatment first involves the use of ice and medicine to reduce pain and inflammation. The use of strengthening and stretching exercises may help reduce pain with activity. These exercises may be performed at home, but referral to   a therapist is often advised. You may be advised to walk with crutches until you are able to walk without a limp. Your caregiver may provide you with a hinged knee brace to help regain a full range of motion while also protecting the injured knee. For severe LCL injuries, or injuries  that involve other ligaments of the knee, surgery is often advised. MEDICATION   If pain medicine is needed, nonsteroidal anti-inflammatory medicines (aspirin and ibuprofen), or other minor pain relievers (acetaminophen), are often advised.  Do not take pain medicine for 7 days before surgery.  Prescription pain relievers may be given, if your caregiver thinks they are needed. Use only as directed and only as much as you need. HEAT AND COLD  Cold treatment (icing) should be applied for 10 to 15 minutes every 2 to 3 hours for inflammation and pain, and immediately after activity that aggravates your symptoms. Use ice packs or an ice massage.  Heat treatment may be used before performing stretching and strengthening activities prescribed by your caregiver, physical therapist, or athletic trainer. Use a heat pack or a warm water soak. SEEK MEDICAL CARE IF:   Symptoms get worse or do not improve in 4 to 6 weeks, despite treatment.  New, unexplained symptoms develop. (Drugs used in treatment may produce side effects.) EXERCISES RANGE OF MOTION (ROM) AND STRETCHING EXERCISES - Lateral Collateral Knee Ligament Sprain Phase I These are some of the initial exercises that your physician, physical therapist or athletic trainer may have you perform to begin your rehabilitation. When you demonstrate gains in your flexibility and strength, your caregiver may progress you to Phase II exercises. As you perform these exercises, remember:   These initial exercises are intended to be gentle. They will help you restore motion without increasing any swelling.  Completing these exercises allows less painful movement and prepares you for the more aggressive strengthening exercises in Phase II.  An effective stretch should be held for at least 30 seconds.  A stretch should never be painful. You should only feel a gentle lengthening or release in the stretched tissue. RANGE OF MOTION - Knee Flexion,  Active  Lie on your back with both knees straight. (If this causes back discomfort, bend your opposite knee, placing your foot flat on the floor.)  Slowly slide your heel back toward your buttocks until you feel a gentle stretch in the front of your knee or thigh.  Hold for __________ seconds. Slowly slide your heel back to the starting position. Repeat __________ times. Complete this exercise __________ times per day.  STRETCH - Knee Flexion, Supine  Lie on the floor with your right / left heel and foot lightly touching the wall. (Place both feet on the wall, if you do not use a door frame.)  Without using any effort, allow gravity to slide your foot down the wall slowly until you feel a gentle stretch in the front of your right / left knee.  Hold this stretch for __________ seconds. Then return the leg to the starting position, using your healthy leg for help, if needed. Repeat __________ times. Complete this stretch __________ times per day.  RANGE OF MOTION - Knee Flexion and Extension, Active-Assisted  Sit on the edge of a table or chair with your thighs firmly supported. It may be helpful to place a folded towel under the end of your right / left thigh.  Flexion (bending): Place the ankle of your healthy leg on top of the other ankle. Use   your healthy leg to gently bend your right / left knee until you feel a mild tension across the top of your knee.  Hold for __________ seconds.  Extension (straightening): Switch your ankles so your right / left leg is on top. Use your healthy leg to straighten your right / left knee until you feel a mild tension on the backside of your knee.  Hold for __________ seconds. Repeat __________ times. Complete this exercise __________ times per day. STRETCH - Knee Extension Sitting  Sit with your right / left leg/heel propped on another chair, coffee table, or foot stool.  Allow your leg muscles to relax, letting gravity straighten out your  knee.*  You should feel a stretch behind your right / left knee. Hold this position for __________ seconds. Repeat __________ times. Complete this stretch __________ times per day.  *Your physician, physical therapist, or athletic trainer may instruct you place a __________ weight on your thigh, just above your kneecap, to deepen the stretch.  STRENGTHENING EXERCISES Lateral Collateral Knee Ligament Sprain - Phase I These exercises may help you when beginning to rehabilitate your injury. They may resolve your symptoms with or without further involvement from your physician, physical therapist, or athletic trainer. While completing these exercises, remember:   Muscles can gain both the endurance and the strength needed for everyday activities through controlled exercises.  Complete these exercises as instructed by your physician, physical therapist or athletic trainer. Increase the resistance and repetitions only as guided.  In order to return to more demanding activities, you will likely need to progress to more challenging exercises. Your physician, physical therapist or athletic trainer will advance your exercises when your tissues show adequate healing and your muscles demonstrate increased strength. STRENGTH - Quadriceps, Isometrics  Lie on your back with your right / left leg extended and your opposite knee bent.  Gradually tense the muscles in the front of your right / left thigh. You should see either your kneecap slide up toward your hip or increased dimpling just above the knee. This motion will push the back of the knee down toward the floor, mat, or bed on which you are lying.  Hold the muscle as tight as you can without increasing your pain for __________ seconds.  Relax the muscles slowly and completely between each repetition. Repeat __________ times. Complete this exercise __________ times per day.  STRENGTH - Quadriceps, Short Arcs   Lie on your back. Place a __________ inch  towel roll under your right / left knee, so that the knee bends slightly.  Raise only your lower leg by tightening the muscles in the front of your thigh. Do not allow your thigh to rise.  Hold this position for __________ seconds. Repeat __________ times. Complete this exercise __________ times per day.  OPTIONAL ANKLE WEIGHTS: Begin with ____________________, but DO NOT exceed ____________________. Increase in 1 pound/0.5 kilogram increments. STRENGTH - Quadriceps, Straight Leg Raises  Quality counts! Watch for signs that the quadriceps muscle is working, to be sure you are strengthening the correct muscles and not "cheating" by substituting with healthier muscles.  Lie on your back with your right / left leg extended and your opposite knee bent.  Tense the muscles in the front of your right / left thigh. You should see either your kneecap slide up or increased dimpling just above the knee. Your thigh may even shake a bit.  Tighten these muscles even more and raise your leg 4 to 6 inches off the   floor. Hold for __________ seconds.  Keeping these muscles tense, lower your leg.  Relax the muscles slowly and completely in between each repetition. Repeat __________ times. Complete this exercise __________ times per day.  STRENGTH - Hamstring, Isometrics   Lie on your back, on a firm surface.  Bend your right / left knee approximately __________ degrees.  Dig your heel into the surface as if you are trying to pull it toward your buttocks. Tighten the muscles in the back of your thighs to "dig" as hard as you can, without increasing any pain.  Hold this position for __________ seconds.  Release the tension gradually and allow your muscle to completely relax for __________ seconds in between each exercise. Repeat __________ times. Complete this exercise __________ times per day.  STRENGTH - Hamstring, Curls   Lie on your stomach with your legs extended. (If you lie on a bed, your feet  may hang over the edge.)  Tighten the muscles in the back of your thigh to bend your right / left knee up to 90 degrees. Keep your hips flat on the bed.  Hold this position for __________ seconds.  Slowly lower your leg back to the starting position. Repeat __________ times. Complete this exercise __________ times per day.  OPTIONAL ANKLE WEIGHTS: Begin with ____________________, but DO NOT exceed ____________________. Increase in 1 pound/0.5 kilogram increments.   This information is not intended to replace advice given to you by your health care provider. Make sure you discuss any questions you have with your health care provider.   Document Released: 08/17/2005 Document Revised: 09/07/2014 Document Reviewed: 11/29/2008 Elsevier Interactive Patient Education 2016 [employer]  

## 2016-01-31 ENCOUNTER — Telehealth: Payer: Self-pay

## 2016-01-31 NOTE — Telephone Encounter (Signed)
Completed paper form for PA of diclofenac gel 1% and faxed to [facility] w/confirmation. Pending.

## 2016-02-07 NOTE — Telephone Encounter (Signed)
PA approved through 08/30/16. Notified pharm and asked them to let pt know when ready.

## 2016-03-23 ENCOUNTER — Other Ambulatory Visit: Payer: Self-pay | Admitting: Emergency Medicine

## 2016-04-14 ENCOUNTER — Encounter: Payer: Self-pay | Admitting: Family Medicine

## 2016-04-14 ENCOUNTER — Ambulatory Visit (INDEPENDENT_AMBULATORY_CARE_PROVIDER_SITE_OTHER): Payer: Medicare Other | Admitting: Family Medicine

## 2016-04-14 VITALS — BP 140/80 | HR 58 | Wt 331.0 lb

## 2016-04-14 DIAGNOSIS — I714 Abdominal aortic aneurysm, without rupture, unspecified: Secondary | ICD-10-CM

## 2016-04-14 DIAGNOSIS — E785 Hyperlipidemia, unspecified: Secondary | ICD-10-CM

## 2016-04-14 DIAGNOSIS — D170 Benign lipomatous neoplasm of skin and subcutaneous tissue of head, face and neck: Secondary | ICD-10-CM | POA: Insufficient documentation

## 2016-04-14 DIAGNOSIS — I10 Essential (primary) hypertension: Secondary | ICD-10-CM

## 2016-04-14 DIAGNOSIS — R221 Localized swelling, mass and lump, neck: Secondary | ICD-10-CM | POA: Diagnosis not present

## 2016-04-14 DIAGNOSIS — E291 Testicular hypofunction: Secondary | ICD-10-CM

## 2016-04-14 DIAGNOSIS — G4733 Obstructive sleep apnea (adult) (pediatric): Secondary | ICD-10-CM

## 2016-04-14 NOTE — Patient Instructions (Addendum)
Pass by our referral coordinator for neck ultrasound for persistent right sided swelling.  Return in 5 months for medicare wellness visit, prior for fasting labs.  Good to meet you today, call us with questions.

## 2016-04-14 NOTE — Assessment & Plan Note (Signed)
Prior on topical testosterone replacement, off since trouble with insurance coverage.

## 2016-04-14 NOTE — Assessment & Plan Note (Addendum)
Ongoing lump R submandibular region since 08/2015. Not resolving, not tender. In ex tobacco user (both cigarettes and smokeless), will further evaluate with neck US.

## 2016-04-14 NOTE — Assessment & Plan Note (Signed)
Continue CPAP.  

## 2016-04-14 NOTE — Assessment & Plan Note (Signed)
Chronic, stable. Continue current regimen. Extensive med rec performed today.

## 2016-04-14 NOTE — Assessment & Plan Note (Signed)
Chronic, on pravastatin. Continue. Check FLP next CPE.

## 2016-04-14 NOTE — Progress Notes (Signed)
BP 140/80   Pulse (!) 58   Wt (!) 331 lb (150.1 kg)   SpO2 93%   BMI 44.89 kg/m    CC: new pt to establish care Subjective:    Patient ID: William Hebert, male    DOB: [birth year], 70 y.o.   MRN: [id number]  HPI: William Hebert is a 70 y.o. male presenting on 04/14/2016 for Establish Care   Prior saw [place] Dr Everlene Farrier. 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   Unsure how he takes meds. Wife helps him with medications. Med rec performed according to med list he brings today which was scanned.   Last saw Dr Einar Gip 2 yrs ago.   H/o skin cancers.  Ex smoker - quit 2007, 80 PY hx.   HTN - Compliant with current antihypertensive regimen as per med list. Med rec performed. Does not check blood pressures at home. No low blood pressure symptoms of dizziness/syncope.  Denies HA, vision changes, CP/tightness, SOB. + leg swelling.   OSA - on CPAP.   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 Interim medical history since our last visit reviewed. Allergies and medications reviewed and updated. Current Outpatient Prescriptions on File Prior to Visit  Medication Sig  . aspirin 81 MG tablet Take 81 mg by mouth daily.    . diclofenac sodium (VOLTAREN) 1 % GEL Apply 2 g topically 4 (four) times daily.  . furosemide (LASIX) 40 MG tablet Take 1 tablet by mouth  daily  . labetalol (NORMODYNE) 200 MG tablet Take 200 mg by mouth daily.  . Misc Natural Products (OSTEO BI-FLEX ADV DOUBLE ST PO) Take by mouth.    [name] NIFEdipine (PROCARDIA XL/ADALAT-CC) 60 MG 24 hr tablet Take 1 tablet by mouth  daily  . omeprazole (PRILOSEC) 20 MG capsule Take 1 capsule by mouth two times daily  . potassium chloride SA (K-DUR,KLOR-CON) 20 MEQ tablet Take 1 tablet by mouth  daily  .  pravastatin (PRAVACHOL) 80 MG tablet Take 1 tablet by mouth  daily   No current facility-administered medications on file prior to visit.     Review of Systems Per HPI unless specifically indicated in ROS section     Objective:    BP 140/80   Pulse (!) 58   Wt (!) 331 lb (150.1 kg)   SpO2 93%   BMI 44.89 kg/m   Wt Readings from Last 3 Encounters:  04/14/16 (!) 331 lb (150.1 kg)  01/23/16 (!) 334 lb 3.2 oz (151.6 kg)  09/10/15 (!) 329 lb (149.2 kg)    Physical Exam  Constitutional: He appears well-developed and well-nourished. No distress.  HENT:  Head: Normocephalic and atraumatic.  Right Ear: External ear normal.  Left Ear: External ear normal.  Mouth/Throat: Oropharynx is clear and moist. No oropharyngeal exudate.  Eyes: Conjunctivae and EOM are normal. Pupils are equal, round, and reactive to light. No scleral icterus.  Neck: Normal range of motion. Neck supple. Thyromegaly (R nodule) present.  Cardiovascular: Normal rate, regular rhythm, normal heart sounds and intact distal pulses.   No murmur heard. Pulmonary/Chest: Effort normal and breath sounds normal. No respiratory distress. He has no wheezes. He has no rales.  Musculoskeletal: Normal range of motion. He exhibits edema (1+).  Lymphadenopathy:       Head (  right side): Submandibular adenopathy present. No submental, no tonsillar, no preauricular and no posterior auricular adenopathy present.       Head (left side): No submental, no submandibular, no tonsillar, no preauricular and no posterior auricular adenopathy present.    He has no cervical adenopathy.       Right: No supraclavicular adenopathy present.       Left: No supraclavicular adenopathy present.  Skin: Skin is warm and dry. No rash noted.  Psychiatric: He has a normal mood and affect.  Nursing note and vitals reviewed.  Results for orders placed or performed in visit on 09/10/15  Testosterone Total,Free,Bio, Males  Result Value Ref Range    Testosterone 182 (L) 250 - 827 ng/dL   Albumin 3.9 3.6 - 5.1 g/dL   Sex Hormone Binding 33 22 - 77 nmol/L   Testosterone, Free 23.0 (L) 47.0 - 244.0 pg/mL   Testosterone, Bioavailable 41.3 (L) 130.5 - 681.7 ng/dL      Assessment & Plan:   Problem List Items Addressed This Visit    AAA (abdominal aortic aneurysm) without rupture [facility])    S/p endovascular repair 2012 with stent Kellie Simmering), yearly f/u      Dyslipidemia    Chronic, on pravastatin. Continue. Check FLP next CPE.       Hypertension    Chronic, stable. Continue current regimen. Extensive med rec performed today.      Hypogonadism in male    Prior on topical testosterone replacement, off since trouble with insurance coverage.      Localized swelling, mass or lump of neck - Primary    Ongoing lump R submandibular region since 08/2015. Not resolving, not tender. In ex tobacco user (both cigarettes and smokeless), will further evaluate with neck US.       Relevant Orders   US Soft Tissue Head/Neck   Obstructive sleep apnea    Continue CPAP.       Other Visit Diagnoses   None.      Follow up plan: Return in about 5 months (around 09/14/2016), or as needed, for medicare wellness visit.  Ria Bush, MD

## 2016-04-14 NOTE — Assessment & Plan Note (Signed)
S/p endovascular repair 2012 with stent William Hebert), yearly f/u

## 2016-04-16 ENCOUNTER — Ambulatory Visit
Admission: RE | Admit: 2016-04-16 | Discharge: 2016-04-16 | Disposition: A | Discharge status: Home / Self Care | Payer: Medicare Other | Source: Ambulatory Visit | Attending: Family Medicine | Admitting: Family Medicine

## 2016-04-16 DIAGNOSIS — R221 Localized swelling, mass and lump, neck: Secondary | ICD-10-CM

## 2016-04-18 ENCOUNTER — Encounter: Payer: Self-pay | Admitting: Family Medicine

## 2016-04-25 ENCOUNTER — Encounter: Payer: Self-pay | Admitting: Family Medicine

## 2016-04-27 ENCOUNTER — Other Ambulatory Visit: Payer: Self-pay | Admitting: Emergency Medicine

## 2016-05-01 ENCOUNTER — Telehealth: Payer: Self-pay

## 2016-05-01 MED ORDER — NIFEDIPINE ER OSMOTIC RELEASE 60 MG PO TB24: 60.0000 mg | ORAL_TABLET | Freq: Every day | ORAL | 0 refills | Status: DC

## 2016-05-01 MED ORDER — POTASSIUM CHLORIDE CRYS ER 20 MEQ PO TBCR: 20.0000 meq | EXTENDED_RELEASE_TABLET | Freq: Every day | ORAL | 0 refills | Status: DC

## 2016-05-01 MED ORDER — PRAVASTATIN SODIUM 80 MG PO TABS: 80.0000 mg | ORAL_TABLET | Freq: Every day | ORAL | 0 refills | Status: DC

## 2016-05-01 NOTE — Telephone Encounter (Signed)
Dr Everlene Farrier, Pt's wife [facility] and is frustrated because pt was only given 30 day RFs of Procardia, K+ and pravastatin from [facility] w/note that pt needs BP check up. She stated that pt was just in and had his BP checked. Checking records pt was in to be seen in May for acute knee contusion/pain and has not been seen for chronic issues/HTN and labs since Jan. Your notes on labs were for pt to RTC in 6 mos to repeat some labs. We followed protocol and gave pt 1 mos RF w/note to RTC since he was overdue to be seen. Wife is upset because it has thrown off their meds so that they are not going to be getting them all at the same time now. It looks like pt est care w/Dr Ria Bush last month, but I don't see any labs in [facility] since Jan. Do you want to OK two mos RF of these meds to get him back on sched with his other meds, or should I have her contact Dr Danise Mina' office (which should have been done in the first place)?

## 2016-05-01 NOTE — Telephone Encounter (Signed)
Spoke to pt's wife and she would rather have the 60 days now, and then will ask pt's new MD to fill the next time. Sent RFs to [facility].

## 2016-05-01 NOTE — Telephone Encounter (Signed)
Okay to fill 2 months of refills or to give a 90 day supply either one is fine.

## 2016-06-08 ENCOUNTER — Other Ambulatory Visit: Payer: Self-pay | Admitting: Emergency Medicine

## 2016-06-09 NOTE — Telephone Encounter (Signed)
03/2016 last ov 09/2015 last labs. Has an appt. 07/2016

## 2016-07-15 ENCOUNTER — Encounter: Payer: Self-pay | Admitting: Family

## 2016-07-15 ENCOUNTER — Other Ambulatory Visit: Payer: Self-pay | Admitting: Emergency Medicine

## 2016-07-17 ENCOUNTER — Other Ambulatory Visit: Payer: Self-pay | Admitting: *Deleted

## 2016-07-17 MED ORDER — POTASSIUM CHLORIDE CRYS ER 20 MEQ PO TBCR: 20.0000 meq | EXTENDED_RELEASE_TABLET | Freq: Every day | ORAL | 1 refills | Status: DC

## 2016-07-17 MED ORDER — NIFEDIPINE ER OSMOTIC RELEASE 60 MG PO TB24: 60.0000 mg | ORAL_TABLET | Freq: Every day | ORAL | 1 refills | Status: DC

## 2016-07-17 MED ORDER — PRAVASTATIN SODIUM 80 MG PO TABS: 80.0000 mg | ORAL_TABLET | Freq: Every day | ORAL | 1 refills | Status: DC

## 2016-07-28 ENCOUNTER — Ambulatory Visit (INDEPENDENT_AMBULATORY_CARE_PROVIDER_SITE_OTHER): Payer: Medicare Other | Admitting: Family

## 2016-07-28 ENCOUNTER — Encounter: Payer: Self-pay | Admitting: Family

## 2016-07-28 ENCOUNTER — Ambulatory Visit (HOSPITAL_COMMUNITY)
Admission: RE | Admit: 2016-07-28 | Discharge: 2016-07-28 | Disposition: A | Discharge status: Home / Self Care | Payer: Medicare Other | Source: Ambulatory Visit | Attending: Family | Admitting: Family

## 2016-07-28 VITALS — BP 170/77 | HR 56 | Temp 97.6°F | Ht 71.0 in | Wt 333.0 lb

## 2016-07-28 DIAGNOSIS — I714 Abdominal aortic aneurysm, without rupture, unspecified: Secondary | ICD-10-CM

## 2016-07-28 DIAGNOSIS — Z95828 Presence of other vascular implants and grafts: Secondary | ICD-10-CM

## 2016-07-28 NOTE — Progress Notes (Signed)
[facility] [place]  CC: Follow up s/p EVAR  History of Present Illness  William Hebert is a 70 y.o. (December 12, 1945) male patient of Dr. Kellie Simmering who is status post abdominal aorto biiliac stent graft repair for abdominal aortic aneurysm on 05-20-2011. He has done well from that standpoint. Aneurysm was approximately 7 cm at the time of his procedure. He has had no complications relative to it.  12/25/14 carotid duplex demonstrated <50% bilateral ICA stenoses.  He denies any hx of stroke or TIA.   Pt Diabetic: No Pt smoker: former smoker, quit in 2007, started about age 68 years   Past Medical History:  Diagnosis Date  . AAA (abdominal aortic aneurysm) without rupture ([place]) 07/10/2014   S/p endovascular repair 2012 with stent Kellie Simmering), yearly f/u  . Arthritis   . GERD (gastroesophageal reflux disease)   . Headache(784.0)   . History of skin cancer 2014  . HTN (hypertension)   . Hyperlipidemia   . Hypogonadism in male    prior on testosterone cream  . Morbidly obese ([place])   . Shortness of breath    Past Surgical History:  Procedure Laterality Date  . ABDOMINAL AORTIC ANEURYSM REPAIR  05/20/2011   aorto bifem. stent graft Kellie Simmering)  . CATARACT EXTRACTION Bilateral 2014  . ELBOW SURGERY    . FOOT TENDON SURGERY Right   . KNEE SURGERY  x 2  . MASS EXCISION N/A 05/10/2013   Procedure: EXCISION back MASS;  Surgeon: Joyice Faster. Cornett, MD;  Location: [facility];  Service: General;  Laterality: N/A;  . SKIN SURGERY     3 different surgeries for melanoma (unclear)  . UMBILICAL HERNIA REPAIR  2002   Social History Social History  Substance Use Topics  . Smoking status: Former Smoker    Packs/day: 2.00    Years: 40.00    Types: Cigarettes    Quit date: 08/31/2005  . Smokeless tobacco: Former Systems developer    Types: Snuff, Chew  . Alcohol use 0.0 oz/week     Comment: rarely   Family History Family History  Problem Relation Age of Onset  . Cancer Mother 22     throat (nonsmoker)  . Heart disease Maternal Aunt   . Alcohol abuse Father     deceased 43yo   Current Outpatient Prescriptions on File Prior to Visit  Medication Sig Dispense Refill  . Ascorbic Acid (VITAMIN C) 1000 MG tablet Take 1,000 mg by mouth daily.    [name] aspirin 81 MG tablet Take 81 mg by mouth daily.      . cholecalciferol (VITAMIN D) 1000 units tablet Take 1,000 Units by mouth daily.    . diclofenac sodium (VOLTAREN) 1 % GEL Apply 2 g topically 4 (four) times daily. 100 g 1  . furosemide (LASIX) 40 MG tablet TAKE 1 TABLET BY MOUTH  DAILY 90 tablet 0  . labetalol (NORMODYNE) 200 MG tablet Take 200 mg by mouth daily.    . Misc Natural Products (OSTEO BI-FLEX ADV DOUBLE ST PO) Take by mouth.      . Multiple Vitamins-Minerals (MULTIVITAMIN ADULT PO) Take 1 tablet by mouth daily.    [name] NIFEdipine (PROCARDIA XL/ADALAT-CC) 60 MG 24 hr tablet Take 1 tablet (60 mg total) by mouth daily. 90 tablet 1  . omeprazole (PRILOSEC) 20 MG capsule Take 1 capsule by mouth two times daily 180 capsule 1  . potassium chloride SA (K-DUR,KLOR-CON) 20 MEQ tablet Take 1 tablet (20 mEq total) by mouth  daily. 90 tablet 1  . pravastatin (PRAVACHOL) 80 MG tablet Take 1 tablet (80 mg total) by mouth daily. 90 tablet 1  . Zinc 100 MG TABS Take 1 tablet by mouth daily.     No current facility-administered medications on file prior to visit.    No Known Allergies   ROS: See HPI for pertinent positives and negatives.  Physical Examination  Vitals:   07/28/16 0836 07/28/16 0839  BP: (!) 157/79 (!) 170/77  Pulse: (!) 56   Temp: 97.6 F (36.4 C)   SpO2: 97%   Weight: (!) 333 lb (151 kg)   Height: 5\' 11"  (1.803 m)    Body mass index is 46.44 kg/m.  General: A&O x 3, WD, morbidly obese male  Pulmonary: Sym exp, respirations are non labored, breath sounds are distant, CTAB, no rales, rhonchi, or wheezing.  Cardiac: RRR, Nl S1, S2, no murmur appreciated  Vascular: Vessel Right Left  Radial  2+Palpable 2+Palpable  Carotid  without bruit  without bruit  Aorta Not palpable N/A  Femoral Not Palpable Not Palpable  Popliteal Not palpable Not palpable  PT 3+Palpable 2+Palpable  DP 3+Palpable 2+Palpable   Gastrointestinal: soft, NTND, -G/R, - HSM, + asymptomatic reducible ventral hernia, - CVAT B.  Musculoskeletal: M/S 5/5 throughout, extremities without ischemic changes.  Neurologic: Pain and light touch intact in extremities, Motor exam as listed above    CTA Abd/Pelvis Duplex (Date: 07-09-15) Aorta: Tortuous distal descending thoracic segment. Suprarenal segment unremarkable. Patent bifurcated infrarenal stent graft. No endoleak. Native sac diameter 6.6 x 6.9 cm (previously 6.9 x 7). IMPRESSION: 1. Patent infrarenal bifurcated aortic stent graft with no endoleak, slight decrease in size of native aneurysm sac from 7 to 6.9 cm maximum axial diameter.    Non-Invasive Vascular Imaging  EVAR Duplex (Date: 07/28/16)  AAA sac size: 6.7 cm AP x transverse diameter not visualized; bilateral CIA's not visualized.  no endoleak detected, based on limited visualization due to patient body habitus.  12/25/14 carotid duplex demonstrated <50% bilateral ICA stenoses.    Medical Decision Making  William Hebert is a 70 y.o. male who presents s/p EVAR (Date: 05-20-2011).  Pt is asymptomatic with stable sac size, based on limited visualization. I discussed the limited visualization of today's duplex with Dr. Donnetta Hutching, pt HPI, morbid obesity status, and normal creatinine in January 2017.   He denies any hx of stroke or TIA.    I discussed with the patient the importance of surveillance of the endograft.   The next CTA will be scheduled for 12 months.  The patient will follow up with [place] in 12 months with these studies, see me afterward.  I emphasized the importance of maximal medical management including strict control of blood pressure, blood glucose, and lipid levels, antiplatelet  agents, obtaining regular exercise, and cessation of smoking.   Thank you for allowing [place] to participate in this patient's care.  Clemon Chambers, RN, MSN, FNP-C [facility] [place] Office: [phone]  Clinic Physician: Early  07/28/2016, 8:41 AM

## 2016-09-16 ENCOUNTER — Other Ambulatory Visit: Payer: Self-pay | Admitting: Physician Assistant

## 2016-09-21 ENCOUNTER — Other Ambulatory Visit: Payer: Self-pay | Admitting: *Deleted

## 2016-09-21 MED ORDER — OMEPRAZOLE 20 MG PO CPDR: DELAYED_RELEASE_CAPSULE | ORAL | 1 refills | Status: DC

## 2016-10-07 ENCOUNTER — Other Ambulatory Visit: Payer: Self-pay | Admitting: *Deleted

## 2016-10-07 MED ORDER — FUROSEMIDE 40 MG PO TABS: 40.0000 mg | ORAL_TABLET | Freq: Every day | ORAL | 0 refills | Status: DC

## 2016-10-28 ENCOUNTER — Other Ambulatory Visit: Payer: Self-pay | Admitting: Primary Care

## 2016-10-28 ENCOUNTER — Encounter: Payer: Self-pay | Admitting: Primary Care

## 2016-10-28 ENCOUNTER — Ambulatory Visit (INDEPENDENT_AMBULATORY_CARE_PROVIDER_SITE_OTHER): Payer: Medicare Other | Admitting: Primary Care

## 2016-10-28 ENCOUNTER — Ambulatory Visit (INDEPENDENT_AMBULATORY_CARE_PROVIDER_SITE_OTHER)
Admission: RE | Admit: 2016-10-28 | Discharge: 2016-10-28 | Disposition: A | Discharge status: Home / Self Care | Payer: Medicare Other | Source: Ambulatory Visit | Attending: Primary Care | Admitting: Primary Care

## 2016-10-28 VITALS — BP 124/76 | HR 84 | Temp 98.3°F | Wt 338.4 lb

## 2016-10-28 DIAGNOSIS — R05 Cough: Secondary | ICD-10-CM

## 2016-10-28 DIAGNOSIS — R059 Cough, unspecified: Secondary | ICD-10-CM

## 2016-10-28 MED ORDER — ALBUTEROL SULFATE HFA 108 (90 BASE) MCG/ACT IN AERS: 2.0000 | INHALATION_SPRAY | Freq: Four times a day (QID) | RESPIRATORY_TRACT | 0 refills | Status: DC | PRN

## 2016-10-28 MED ORDER — ALBUTEROL SULFATE (2.5 MG/3ML) 0.083% IN NEBU
2.5000 mg | INHALATION_SOLUTION | Freq: Once | RESPIRATORY_TRACT | Status: AC
  Administered 2016-10-28: 2.5 mg via RESPIRATORY_TRACT

## 2016-10-28 MED ORDER — HYDROCOD POLST-CPM POLST ER 10-8 MG/5ML PO SUER: 5.0000 mL | Freq: Two times a day (BID) | ORAL | 0 refills | Status: DC | PRN

## 2016-10-28 MED ORDER — IPRATROPIUM BROMIDE 0.02 % IN SOLN
0.5000 mg | Freq: Once | RESPIRATORY_TRACT | Status: AC
  Administered 2016-10-28: 0.5 mg via RESPIRATORY_TRACT

## 2016-10-28 NOTE — Progress Notes (Signed)
Subjective:    Patient ID: William Hebert, male    DOB: [birth year], 71 y.o.   MRN: [id number]  HPI  William Hebert is a 71 year old male with a history of GERD, OSA, HTN who presents today with a chief complaint of cough. He also reports chest congestion, wheezing, shortness of breath with exertion, sore throat. His symptoms began yesterday. Over the past several weeks he's had watery eyes and runny nose. He denies chest pain, body aches, fevers, increase in lower extremity edema. He's taken a few antibiotics that he had for an older prescription and benadryl on Monday night that helped him sleep.   Review of Systems  Constitutional: Negative for fatigue and fever.  HENT: Positive for congestion, sneezing and sore throat.   Respiratory: Positive for cough, shortness of breath and wheezing.   Musculoskeletal: Negative for myalgias.       Past Medical History:  Diagnosis Date  . AAA (abdominal aortic aneurysm) without rupture ([place]) 07/10/2014   S/p endovascular repair 2012 with stent Kellie Simmering), yearly f/u  . Arthritis   . GERD (gastroesophageal reflux disease)   . Headache(784.0)   . History of skin cancer 2014  . HTN (hypertension)   . Hyperlipidemia   . Hypogonadism in male    prior on testosterone cream  . Morbidly obese ([place])   . Shortness of breath      Social History   Social History  . Marital status: Married    Spouse name: N/A  . Number of children: N/A  . Years of education: N/A   Occupational History  . 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     Social History Main Topics  . Smoking status: Former Smoker    Packs/day: 2.00    Years: 40.00    Types: Cigarettes    Quit date: 08/31/2005  . Smokeless tobacco: Former Systems developer    Types: Snuff, Chew  . Alcohol use 0.0 oz/week     Comment: rarely  . Drug use: No  . Sexual activity: Yes    Partners: Female   Other Topics Concern  . Not on file   Social History Narrative   Lives with wife   Occ: semi-retired Corporate treasurer, works for  [employer]   Edu: HS   Activity: no regular exercise   Diet: no water, sweet tea, some fruits/vegetables    Past Surgical History:  Procedure Laterality Date  . ABDOMINAL AORTIC ANEURYSM REPAIR  05/20/2011   aorto bifem. stent graft Kellie Simmering)  . CATARACT EXTRACTION Bilateral 2014  . ELBOW SURGERY    . FOOT TENDON SURGERY Right   . KNEE SURGERY  x 2  . MASS EXCISION N/A 05/10/2013   Procedure: EXCISION back MASS;  Surgeon: Joyice Faster. Cornett, MD;  Location: [facility];  Service: General;  Laterality: N/A;  . SKIN SURGERY     3 different surgeries for melanoma (unclear)  . UMBILICAL HERNIA REPAIR  2002    Family History  Problem Relation Age of Onset  . Cancer Mother 27    throat (nonsmoker)  . Heart disease Maternal Aunt   . Alcohol abuse Father     deceased 75yo    No Known Allergies  Current Outpatient Prescriptions on File Prior to Visit  Medication Sig Dispense Refill  . Ascorbic Acid (VITAMIN C) 1000 MG tablet Take 1,000 mg by mouth daily.    [name] aspirin 81 MG tablet Take 81 mg by mouth daily.      . cholecalciferol (VITAMIN  D) 1000 units tablet Take 1,000 Units by mouth daily.    . diclofenac sodium (VOLTAREN) 1 % GEL Apply 2 g topically 4 (four) times daily. 100 g 1  . furosemide (LASIX) 40 MG tablet Take 1 tablet (40 mg total) by mouth daily. 90 tablet 0  . labetalol (NORMODYNE) 200 MG tablet Take 200 mg by mouth daily.    . Misc Natural Products (OSTEO BI-FLEX ADV DOUBLE ST PO) Take by mouth.      . Multiple Vitamins-Minerals (MULTIVITAMIN ADULT PO) Take 1 tablet by mouth daily.    [name] NIFEdipine (PROCARDIA XL/ADALAT-CC) 60 MG 24 hr tablet Take 1 tablet (60 mg total) by mouth daily. 90 tablet 1  . omeprazole (PRILOSEC) 20 MG capsule Take 1 capsule by mouth two times daily 180 capsule 1  . potassium chloride SA (K-DUR,KLOR-CON) 20 MEQ tablet Take 1 tablet (20 mEq total) by mouth daily. 90 tablet 1  . pravastatin (PRAVACHOL) 80 MG tablet Take 1 tablet  (80 mg total) by mouth daily. 90 tablet 1  . Zinc 100 MG TABS Take 1 tablet by mouth daily.     No current facility-administered medications on file prior to visit.     BP 124/76   Pulse 84   Temp 98.3 F (36.8 C) (Oral)   Wt (!) 338 lb 6.4 oz (153.5 kg)   SpO2 94%   BMI 47.20 kg/m    Objective:   Physical Exam  Constitutional: He appears well-nourished. He does not appear ill.  HENT:  Right Ear: Tympanic membrane and ear canal normal.  Left Ear: Tympanic membrane and ear canal normal.  Nose: No mucosal edema. Right sinus exhibits no maxillary sinus tenderness and no frontal sinus tenderness. Left sinus exhibits no maxillary sinus tenderness and no frontal sinus tenderness.  Mouth/Throat: Oropharynx is clear and moist.  Eyes: Conjunctivae are normal.  Neck: Neck supple.  Cardiovascular: Normal rate and regular rhythm.   Pulmonary/Chest: Effort normal and breath sounds normal. He has no wheezes. He has no rales.  Difficult to auscultate, sounds tight. Harsh cough with bronchospasm in clinic.  Skin: Skin is warm and dry.          Assessment & Plan:  URI:  Cough, congestion, shortness of breath x 24 hours. Has taken 3 antibiotic tablets of Penicillin that he has around for dental procedures. He does not appear sickly, but harsh cough with bronchospasm. DuoNeb provided in the clinic today. Chest xray pending to rule out infiltrates as it was difficulty to auscultate.  Could be viral/allergy related, low suspicion for bacterial cause at this point, but will await xray result. Will treat cough with anti-tussives and albuterol inhaler. He is stable for outpatient treatment at this point, SOB improved with DuoNeb. Fluids, rest, return precautions provided.  Sheral Flow, NP

## 2016-10-28 NOTE — Progress Notes (Signed)
Pre visit review using our clinic review tool, if applicable. No additional management support is needed unless otherwise documented below in the visit note. 

## 2016-10-28 NOTE — Addendum Note (Signed)
Addended by: Jacqualin Combes on: 10/28/2016 02:23 PM   Modules accepted: Orders

## 2016-10-28 NOTE — Patient Instructions (Addendum)
Complete xray(s) prior to leaving today. I will notify you of your results once received.  You may take the Tussionex cough suppressant every 12 hours as needed for cough and rest. Caution this medication contains codeine and will make you feel drowsy.  Cough/Shortness of Breath: Use the Albuterol inhaler: Inhale 2 puffs into the lungs every 6 to 8 hours as needed for wheezing and/or shortness of breath.   I will be in touch later today.  It was a pleasure meeting you!

## 2016-10-29 ENCOUNTER — Ambulatory Visit: Payer: Medicare Other | Admitting: Family Medicine

## 2016-10-30 ENCOUNTER — Other Ambulatory Visit: Payer: Self-pay | Admitting: Family Medicine

## 2016-10-30 MED ORDER — OPTICHAMBER DIAMOND DEVI: 0 refills | Status: DC

## 2016-11-02 ENCOUNTER — Ambulatory Visit: Payer: Medicare Other | Admitting: Family Medicine

## 2016-11-03 ENCOUNTER — Ambulatory Visit (INDEPENDENT_AMBULATORY_CARE_PROVIDER_SITE_OTHER): Payer: Medicare Other | Admitting: Family Medicine

## 2016-11-03 ENCOUNTER — Encounter: Payer: Self-pay | Admitting: Family Medicine

## 2016-11-03 DIAGNOSIS — R05 Cough: Secondary | ICD-10-CM | POA: Diagnosis not present

## 2016-11-03 DIAGNOSIS — R059 Cough, unspecified: Secondary | ICD-10-CM

## 2016-11-03 NOTE — Patient Instructions (Addendum)
You may have mild COPD but we won't know until you have formal testing done.  More importantly, use the inhaler if needed in the meantime for cough or wheeze.  When well, if still needing inhaler more than twice a week, then let us know.  Take care.  Glad to see you. Schedule a physical with Dr. Darnell Level.

## 2016-11-03 NOTE — Progress Notes (Signed)
Pre visit review using our clinic review tool, if applicable. No additional management support is needed unless otherwise documented below in the visit note. 

## 2016-11-03 NOTE — Progress Notes (Signed)
Prev cough is clearly improved.  Still taking cough medicine at night but doing better in the meantime.  No fevers.  Scant sputum production, clear.  No vomiting. No diarrhea.  Last used SABA about 48 hours ago.  No wheeze.    Prev smoked for about 40 years.     D/w pt about smoking, recent illness, possible COPD dx.  Imaging reviewed with patient at [facility].   Meds, vitals, and allergies reviewed.   ROS: Per HPI unless specifically indicated in ROS section   GEN: nad, alert and oriented, obese HEENT: mucous membranes moist, OP wnl NECK: supple w/o LA CV: rrr.  PULM: occ mild dry cough but ctab, no inc wob ABD: soft, +bs

## 2016-11-04 DIAGNOSIS — R053 Chronic cough: Secondary | ICD-10-CM | POA: Insufficient documentation

## 2016-11-04 DIAGNOSIS — R05 Cough: Secondary | ICD-10-CM | POA: Insufficient documentation

## 2016-11-04 NOTE — Assessment & Plan Note (Signed)
Improved.  Prev with flattening of diaphragm noted, with suggestion of possible COPD.  D/w pt about formal dx with PFTs but shouldn't be done now as results could be affected by recent illness.  At risk for copd, since he smoked about 40 years.  Not smoking now.  Wouldn't need testing now as he is improved, and can continue to use SABA prn in the near future.  If continuing to need SABA, then reasonable to consider testing for COPD in the future.  At this point, okay for outpatient f/u.  All questions answered.  More than anything, needs to lose weight, d/w pt. >25 minutes spent in face to face time with patient, >50% spent in counselling or coordination of care.  Will defer to PCP, ie patient needs to schedule CPE, d/w pt.

## 2016-11-25 ENCOUNTER — Other Ambulatory Visit: Payer: Self-pay | Admitting: Family Medicine

## 2016-11-25 ENCOUNTER — Ambulatory Visit (INDEPENDENT_AMBULATORY_CARE_PROVIDER_SITE_OTHER): Payer: Medicare Other

## 2016-11-25 VITALS — BP 130/70 | HR 80 | Temp 98.3°F | Ht 71.0 in | Wt 333.8 lb

## 2016-11-25 DIAGNOSIS — Z125 Encounter for screening for malignant neoplasm of prostate: Secondary | ICD-10-CM | POA: Diagnosis not present

## 2016-11-25 DIAGNOSIS — I10 Essential (primary) hypertension: Secondary | ICD-10-CM | POA: Diagnosis not present

## 2016-11-25 DIAGNOSIS — E559 Vitamin D deficiency, unspecified: Secondary | ICD-10-CM | POA: Diagnosis not present

## 2016-11-25 DIAGNOSIS — Z0000 Encounter for general adult medical examination without abnormal findings: Secondary | ICD-10-CM

## 2016-11-25 DIAGNOSIS — E785 Hyperlipidemia, unspecified: Secondary | ICD-10-CM | POA: Diagnosis not present

## 2016-11-25 LAB — CBC WITH DIFFERENTIAL/PLATELET
Basophils Absolute: 0.1 10*3/uL (ref 0.0–0.1)
Basophils Relative: 0.7 % (ref 0.0–3.0)
Eosinophils Absolute: 0.2 10*3/uL (ref 0.0–0.7)
Eosinophils Relative: 2.7 % (ref 0.0–5.0)
HCT: 42.8 % (ref 39.0–52.0)
Hemoglobin: 14.4 g/dL (ref 13.0–17.0)
Lymphocytes Relative: 17.8 % (ref 12.0–46.0)
Lymphs Abs: 1.5 10*3/uL (ref 0.7–4.0)
MCHC: 33.7 g/dL (ref 30.0–36.0)
MCV: 90.7 fl (ref 78.0–100.0)
Monocytes Absolute: 0.8 10*3/uL (ref 0.1–1.0)
Monocytes Relative: 9.3 % (ref 3.0–12.0)
Neutro Abs: 5.7 10*3/uL (ref 1.4–7.7)
Neutrophils Relative %: 69.5 % (ref 43.0–77.0)
Platelets: 208 10*3/uL (ref 150.0–400.0)
RBC: 4.72 Mil/uL (ref 4.22–5.81)
RDW: 13.9 % (ref 11.5–15.5)
WBC: 8.2 10*3/uL (ref 4.0–10.5)

## 2016-11-25 LAB — LIPID PANEL
Cholesterol: 131 mg/dL (ref 0–200)
HDL: 30.8 mg/dL — ABNORMAL LOW (ref >39.00)
NonHDL: 99.8
Total CHOL/HDL Ratio: 4
Triglycerides: 247 mg/dL — ABNORMAL HIGH (ref 0.0–149.0)
VLDL: 49.4 mg/dL — ABNORMAL HIGH (ref 0.0–40.0)

## 2016-11-25 LAB — COMPREHENSIVE METABOLIC PANEL
ALT: 24 U/L (ref 0–53)
AST: 19 U/L (ref 0–37)
Albumin: 3.9 g/dL (ref 3.5–5.2)
Alkaline Phosphatase: 79 U/L (ref 39–117)
BUN: 14 mg/dL (ref 6–23)
CO2: 30 mEq/L (ref 19–32)
Calcium: 9.2 mg/dL (ref 8.4–10.5)
Chloride: 104 mEq/L (ref 96–112)
Creatinine, Ser: 1.12 mg/dL (ref 0.40–1.50)
GFR: 69.06 mL/min (ref >60.00)
Glucose, Bld: 161 mg/dL — ABNORMAL HIGH (ref 70–99)
Potassium: 3.5 mEq/L (ref 3.5–5.1)
Sodium: 141 mEq/L (ref 135–145)
Total Bilirubin: 0.3 mg/dL (ref 0.2–1.2)
Total Protein: 6.1 g/dL (ref 6.0–8.3)

## 2016-11-25 LAB — LDL CHOLESTEROL, DIRECT: Direct LDL: 69 mg/dL

## 2016-11-25 LAB — PSA, MEDICARE: PSA: 0.69 ng/ml (ref 0.10–4.00)

## 2016-11-25 LAB — VITAMIN D 25 HYDROXY (VIT D DEFICIENCY, FRACTURES): VITD: 20.73 ng/mL — ABNORMAL LOW (ref 30.00–100.00)

## 2016-11-25 NOTE — Progress Notes (Signed)
Subjective:   William Hebert is a 71 y.o. male who presents for Medicare Annual/Subsequent preventive examination.  Review of Systems:  N/A Cardiac Risk Factors include: advanced age (>63men, >25 women);obesity (BMI >30kg/m2);dyslipidemia;hypertension;male gender     Objective:    Vitals: BP 130/70 (BP Location: Right Wrist, Patient Position: Sitting, Cuff Size: Normal)   Pulse 80   Temp 98.3 F (36.8 C) (Oral)   Ht 5\' 11"  (1.803 m) Comment: no shoes  Wt (!) 333 lb 12 oz (151.4 kg)   SpO2 92%   BMI 46.55 kg/m   Body mass index is 46.55 kg/m.  Tobacco History  Smoking Status  . Former Smoker  . Packs/day: 2.00  . Years: 40.00  . Types: Cigarettes  . Quit date: 08/31/2005  Smokeless Tobacco  . Former Systems developer  . Types: Snuff, Chew     Counseling given: No   Past Medical History:  Diagnosis Date  . AAA (abdominal aortic aneurysm) without rupture ([place]) 07/10/2014   S/p endovascular repair 2012 with stent Kellie Simmering), yearly f/u  . Arthritis   . GERD (gastroesophageal reflux disease)   . Headache(784.0)   . History of skin cancer 2014  . HTN (hypertension)   . Hyperlipidemia   . Hypogonadism in male    prior on testosterone cream  . Morbidly obese ([place])   . Shortness of breath    Past Surgical History:  Procedure Laterality Date  . ABDOMINAL AORTIC ANEURYSM REPAIR  05/20/2011   aorto bifem. stent graft Kellie Simmering)  . CATARACT EXTRACTION Bilateral 2014  . ELBOW SURGERY    . FOOT TENDON SURGERY Right   . KNEE SURGERY  x 2  . MASS EXCISION N/A 05/10/2013   Procedure: EXCISION back MASS;  Surgeon: Joyice Faster. Cornett, MD;  Location: [facility];  Service: General;  Laterality: N/A;  . SKIN SURGERY     3 different surgeries for melanoma (unclear)  . UMBILICAL HERNIA REPAIR  2002   Family History  Problem Relation Age of Onset  . Cancer Mother 61    throat (nonsmoker)  . Heart disease Maternal Aunt   . Alcohol abuse Father     deceased 14yo   History    Sexual Activity  . Sexual activity: Yes  . Partners: Female    Outpatient Encounter Prescriptions as of 11/25/2016  Medication Sig  . albuterol (PROVENTIL HFA;VENTOLIN HFA) 108 (90 Base) MCG/ACT inhaler Inhale 2 puffs into the lungs every 6 (six) hours as needed for wheezing or shortness of breath.  . Ascorbic Acid (VITAMIN C) 1000 MG tablet Take 1,000 mg by mouth daily.  [name] aspirin 81 MG tablet Take 81 mg by mouth daily.    . chlorpheniramine-HYDROcodone (TUSSIONEX PENNKINETIC ER) 10-8 MG/5ML SUER Take 5 mLs by mouth every 12 (twelve) hours as needed.  . cholecalciferol (VITAMIN D) 1000 units tablet Take 1,000 Units by mouth daily.  . furosemide (LASIX) 40 MG tablet TAKE 1 TABLET BY MOUTH  DAILY  . labetalol (NORMODYNE) 200 MG tablet Take 200 mg by mouth daily.  . Misc Natural Products (OSTEO BI-FLEX ADV DOUBLE ST PO) Take by mouth.    . Multiple Vitamins-Minerals (MULTIVITAMIN ADULT PO) Take 1 tablet by mouth daily.  [name] NIFEdipine (PROCARDIA XL/ADALAT-CC) 60 MG 24 hr tablet TAKE 1 TABLET BY MOUTH  DAILY  . NON FORMULARY Tumeric with pepper  . omeprazole (PRILOSEC) 20 MG capsule Take 1 capsule by mouth two times daily  . potassium chloride SA (K-DUR,KLOR-CON) 20 MEQ tablet  TAKE 1 TABLET BY MOUTH  DAILY  . pravastatin (PRAVACHOL) 80 MG tablet TAKE 1 TABLET BY MOUTH  DAILY  . Spacer/Aero-Holding Chambers [facility] DIAMOND) DEVI Use as directed with albuterol inhaler  . Zinc 100 MG TABS Take 1 tablet by mouth daily.  . diclofenac sodium (VOLTAREN) 1 % GEL Apply 2 g topically 4 (four) times daily. (Patient not taking: Reported on 11/03/2016)   No facility-administered encounter medications on file as of 11/25/2016.     Activities of Daily Living In your present state of health, do you have any difficulty performing the following activities: 11/25/2016  Hearing? N  Vision? N  Difficulty concentrating or making decisions? N  Walking or climbing stairs? Y  Dressing or bathing? N  Doing  errands, shopping? N  Preparing Food and eating ? N  Using the Toilet? N  In the past six months, have you accidently leaked urine? N  Do you have problems with loss of bowel control? N  Managing your Medications? N  Managing your Finances? N  Housekeeping or managing your Housekeeping? N  Some recent data might be hidden    Patient Care Team: Ria Bush, MD as PCP - General (Family Medicine) Adrian Prows, MD (Cardiology)   Assessment:     Hearing Screening (Inadequate exam)   125Hz  250Hz  500Hz  1000Hz  2000Hz  3000Hz  4000Hz  6000Hz  8000Hz   Right ear:   40 40 40  0    Left ear:   40 40 40  0    Vision Screening Comments: Last vision in Oct 2017 with Dr. Ronnald Ramp   Exercise Activities and Dietary recommendations Current Exercise Habits: The patient has a physically strenous job, but has no regular exercise apart from work. (mow, yard work 2 hrs week when weather permits), Exercise limited by: None identified  Goals    . physical          When weather permits, I resume yard work at least 120 min once weekly.       Fall Risk Fall Risk  11/25/2016 01/23/2016 09/10/2015 02/12/2015 03/06/2014  Falls in the past year? No No No No No   Depression Screen PHQ 2/9 Scores 11/25/2016 01/23/2016 09/10/2015 08/24/2015  PHQ - 2 Score 0 0 0 0    Cognitive Function MMSE - Mini Mental State Exam 11/25/2016  Orientation to time 5  Orientation to Place 5  Registration 3  Attention/ Calculation 0  Recall 3  Language- name 2 objects 0  Language- repeat 1  Language- follow 3 step command 3  Language- read & follow direction 0  Write a sentence 0  Copy design 0  Total score 20     PLEASE NOTE: A Mini-Cog screen was completed. Maximum score is 20. A value of 0 denotes this part of [place] MMSE was not completed or the patient failed this part of the Mini-Cog screening.   Mini-Cog Screening Orientation to Time - Max 5 pts Orientation to Place - Max 5 pts Registration - Max 3 pts Recall - Max 3  pts Language Repeat - Max 1 pts Language Follow 3 Step Command - Max 3 pts     Immunization History  Administered Date(s) Administered  . Pneumococcal Conjugate-13 03/06/2014  . Pneumococcal Polysaccharide-23 09/10/2015  . Td 03/06/2014   Screening Tests Health Maintenance  Topic Date Due  . COLONOSCOPY  11/28/2017 (Originally 10/06/1997)  . DTaP/Tdap/Td (1 - Tdap) 03/06/2024 (Originally 03/07/2014)  . INFLUENZA VACCINE  11/28/2025 (Originally 03/31/2016)  . TETANUS/TDAP  03/06/2024  .  Hepatitis C Screening  Completed  . PNA vac Low Risk Adult  Completed      Plan:     I have personally reviewed and addressed the Medicare Annual Wellness questionnaire and have noted the following in the patient's chart:  A. Medical and social history B. Use of alcohol, tobacco or illicit drugs  C. Current medications and supplements D. Functional ability and status E.  Nutritional status F.  Physical activity G. Advance directives H. List of other physicians I.  Hospitalizations, surgeries, and ER visits in previous 12 months J.  [place] to include hearing, vision, cognitive, depression L. Referrals and appointments - none  In addition, I have reviewed and discussed with patient certain preventive protocols, quality metrics, and best practice recommendations. A written personalized care plan for preventive services as well as general preventive health recommendations were provided to patient.  See attached scanned questionnaire for additional information.   Signed,   Lindell Noe, MHA, BS, LPN Health Coach

## 2016-11-25 NOTE — Progress Notes (Signed)
PCP notes:   Health maintenance:  Flu vaccine - pt declined  Colon cancer screening - pt will discuss with PCP at next appt  Abnormal screenings:   Hearing - failed  Patient concerns:   Pt has complain of left shoulder pain. Pain scale: 3/10.  Nurse concerns:  None  Next PCP appt:   12/10/16 @ 1630  I reviewed health advisor's note, was available for consultation on the day of service listed in this note, and agree with documentation and plan. Elsie Stain, MD.

## 2016-11-25 NOTE — Patient Instructions (Signed)
Mr. Shavers , Thank you for taking time to come for your Medicare Wellness Visit. I appreciate your ongoing commitment to your health goals. Please review the following plan we discussed and let me know if I can assist you in the future.   These are the goals we discussed: Goals    . physical          When weather permits, I resume yard work at least 120 min once weekly.        This is a list of the screening recommended for you and due dates:  Health Maintenance  Topic Date Due  . Colon Cancer Screening  11/28/2017*  . DTaP/Tdap/Td vaccine (1 - Tdap) 03/06/2024*  . Flu Shot  11/28/2025*  . Tetanus Vaccine  03/06/2024  .  Hepatitis C: One time screening is recommended by Center for Disease Control  (CDC) for  adults born from [birth year] through 1963.   Completed  . Pneumonia vaccines  Completed  *Topic was postponed. The date shown is not the original due date.   Preventive Care for Adults  A healthy lifestyle and preventive care can promote health and wellness. Preventive health guidelines for adults include the following key practices.  . A routine yearly physical is a good way to check with your health care provider about your health and preventive screening. It is a chance to share any concerns and updates on your health and to receive a thorough exam.  . Visit your dentist for a routine exam and preventive care every 6 months. Brush your teeth twice a day and floss once a day. Good oral hygiene prevents tooth decay and gum disease.  . The frequency of eye exams is based on your age, health, family medical history, use  of contact lenses, and other factors. Follow your health care provider's ecommendations for frequency of eye exams.  . Eat a healthy diet. Foods like vegetables, fruits, whole grains, low-fat dairy products, and lean protein foods contain the nutrients you need without too many calories. Decrease your intake of foods high in solid fats, added sugars, and salt. Eat the  right amount of calories for you. Get information about a proper diet from your health care provider, if necessary.  . Regular physical exercise is one of the most important things you can do for your health. Most adults should get at least 150 minutes of moderate-intensity exercise (any activity that increases your heart rate and causes you to sweat) each week. In addition, most adults need muscle-strengthening exercises on 2 or more days a week.  Silver Sneakers may be a benefit available to you. To determine eligibility, you may visit the website: www.silversneakers.com or contact program at [phone]) [phone] Mon-Fri between 8AM-8PM.   . Maintain a healthy weight. The body mass index (BMI) is a screening tool to identify possible weight problems. It provides an estimate of body fat based on height and weight. Your health care provider can find your BMI and can help you achieve or maintain a healthy weight.   For adults 20 years and older: ? A BMI below 18.5 is considered underweight. ? A BMI of 18.5 to 24.9 is normal. ? A BMI of 25 to 29.9 is considered overweight. ? A BMI of 30 and above is considered obese.   . Maintain normal blood lipids and cholesterol levels by exercising and minimizing your intake of saturated fat. Eat a balanced diet with plenty of fruit and vegetables. Blood tests for lipids and cholesterol should begin  at age 55 and be repeated every 5 years. If your lipid or cholesterol levels are high, you are over 50, or you are at high risk for heart disease, you may need your cholesterol levels checked more frequently. Ongoing high lipid and cholesterol levels should be treated with medicines if diet and exercise are not working.  . If you smoke, find out from your health care provider how to quit. If you do not use tobacco, please do not start.  . If you choose to drink alcohol, please do not consume more than 2 drinks per day. One drink is considered to be 12 ounces (355 mL) of  beer, 5 ounces (148 mL) of wine, or 1.5 ounces (44 mL) of liquor.  . If you are 50-55 years old, ask your health care provider if you should take aspirin to prevent strokes.  . Use sunscreen. Apply sunscreen liberally and repeatedly throughout the day. You should seek shade when your shadow is shorter than you. Protect yourself by wearing long sleeves, pants, a wide-brimmed hat, and sunglasses year round, whenever you are outdoors.  . Once a month, do a whole body skin exam, using a mirror to look at the skin on your back. Tell your health care provider of new moles, moles that have irregular borders, moles that are larger than a pencil eraser, or moles that have changed in shape or color.

## 2016-11-25 NOTE — Progress Notes (Signed)
Pre visit review using our clinic review tool, if applicable. No additional management support is needed unless otherwise documented below in the visit note. 

## 2016-11-26 ENCOUNTER — Other Ambulatory Visit (INDEPENDENT_AMBULATORY_CARE_PROVIDER_SITE_OTHER): Payer: Medicare Other

## 2016-11-26 DIAGNOSIS — R739 Hyperglycemia, unspecified: Secondary | ICD-10-CM | POA: Diagnosis not present

## 2016-11-26 LAB — HEMOGLOBIN A1C: Hgb A1c MFr Bld: 6.3 % (ref 4.6–6.5)

## 2016-12-10 ENCOUNTER — Ambulatory Visit: Payer: Medicare Other | Admitting: Family Medicine

## 2016-12-15 ENCOUNTER — Ambulatory Visit (INDEPENDENT_AMBULATORY_CARE_PROVIDER_SITE_OTHER): Payer: Medicare Other | Admitting: Internal Medicine

## 2016-12-15 ENCOUNTER — Encounter: Payer: Self-pay | Admitting: Internal Medicine

## 2016-12-15 VITALS — BP 154/86 | HR 78 | Temp 99.0°F | Resp 20 | Wt 334.2 lb

## 2016-12-15 DIAGNOSIS — J209 Acute bronchitis, unspecified: Secondary | ICD-10-CM | POA: Insufficient documentation

## 2016-12-15 DIAGNOSIS — J42 Unspecified chronic bronchitis: Secondary | ICD-10-CM | POA: Diagnosis not present

## 2016-12-15 DIAGNOSIS — R05 Cough: Secondary | ICD-10-CM

## 2016-12-15 DIAGNOSIS — R059 Cough, unspecified: Secondary | ICD-10-CM

## 2016-12-15 MED ORDER — DOXYCYCLINE HYCLATE 100 MG PO TABS: 100.0000 mg | ORAL_TABLET | Freq: Two times a day (BID) | ORAL | 0 refills | Status: DC

## 2016-12-15 MED ORDER — HYDROCOD POLST-CPM POLST ER 10-8 MG/5ML PO SUER: 5.0000 mL | Freq: Every evening | ORAL | 0 refills | Status: DC | PRN

## 2016-12-15 MED ORDER — PREDNISONE 20 MG PO TABS: 40.0000 mg | ORAL_TABLET | Freq: Every day | ORAL | 0 refills | Status: DC

## 2016-12-15 NOTE — Assessment & Plan Note (Signed)
Bad cough and SOB when lying down Not much wheezing and reasonably comfortable now Will give prednisone and doxy Cough syrup He has not had ongoing symptoms so is not ready to see pulmonologist

## 2016-12-15 NOTE — Progress Notes (Signed)
Subjective:    Patient ID: William Hebert, male    DOB: 1946-05-20, 71 y.o.   MRN: [id number]  HPI Here due to respiratory illness  Having some trouble breathing--but still able to walk around Known chronic bronchitis----  Cough bad if lying flat---coughing up sputum (clear) Slept better sleeping in chair  This started after mowing yard 2 days ago Not usually pollen sensitive No fever No chills but he feels hot  Slight sore throat No ear pain Headache this AM--better with tylenol  Using albuterol inhaler Wife thinks it helps him--but he doesn't  Current Outpatient Prescriptions on File Prior to Visit  Medication Sig Dispense Refill  . albuterol (PROVENTIL HFA;VENTOLIN HFA) 108 (90 Base) MCG/ACT inhaler Inhale 2 puffs into the lungs every 6 (six) hours as needed for wheezing or shortness of breath. 1 Inhaler 0  . Ascorbic Acid (VITAMIN C) 1000 MG tablet Take 1,000 mg by mouth daily.    [name] aspirin 81 MG tablet Take 81 mg by mouth daily.      . cholecalciferol (VITAMIN D) 1000 units tablet Take 1,000 Units by mouth daily.    . diclofenac sodium (VOLTAREN) 1 % GEL Apply 2 g topically 4 (four) times daily. 100 g 1  . furosemide (LASIX) 40 MG tablet TAKE 1 TABLET BY MOUTH  DAILY 90 tablet 1  . labetalol (NORMODYNE) 200 MG tablet Take 200 mg by mouth daily.    . Misc Natural Products (OSTEO BI-FLEX ADV DOUBLE ST PO) Take by mouth.      . Multiple Vitamins-Minerals (MULTIVITAMIN ADULT PO) Take 1 tablet by mouth daily.    [name] NIFEdipine (PROCARDIA XL/ADALAT-CC) 60 MG 24 hr tablet TAKE 1 TABLET BY MOUTH  DAILY 90 tablet 1  . NON FORMULARY Tumeric with pepper    . omeprazole (PRILOSEC) 20 MG capsule Take 1 capsule by mouth two times daily 180 capsule 1  . potassium chloride SA (K-DUR,KLOR-CON) 20 MEQ tablet TAKE 1 TABLET BY MOUTH  DAILY 90 tablet 1  . pravastatin (PRAVACHOL) 80 MG tablet TAKE 1 TABLET BY MOUTH  DAILY 90 tablet 1  . Spacer/Aero-Holding Chambers [facility] DIAMOND) DEVI  Use as directed with albuterol inhaler 1 Device 0  . Zinc 100 MG TABS Take 1 tablet by mouth daily.     No current facility-administered medications on file prior to visit.     No Known Allergies  Past Medical History:  Diagnosis Date  . AAA (abdominal aortic aneurysm) without rupture ([place]) 07/10/2014   S/p endovascular repair 2012 with stent Kellie Simmering), yearly f/u  . Arthritis   . GERD (gastroesophageal reflux disease)   . Headache(784.0)   . History of skin cancer 2014  . HTN (hypertension)   . Hyperlipidemia   . Hypogonadism in male    prior on testosterone cream  . Morbidly obese ([place])   . Shortness of breath     Past Surgical History:  Procedure Laterality Date  . ABDOMINAL AORTIC ANEURYSM REPAIR  05/20/2011   aorto bifem. stent graft Kellie Simmering)  . CATARACT EXTRACTION Bilateral 2014  . ELBOW SURGERY    . FOOT TENDON SURGERY Right   . KNEE SURGERY  x 2  . MASS EXCISION N/A 05/10/2013   Procedure: EXCISION back MASS;  Surgeon: Joyice Faster. Cornett, MD;  Location: [facility];  Service: General;  Laterality: N/A;  . SKIN SURGERY     3 different surgeries for melanoma (unclear)  . UMBILICAL HERNIA REPAIR  2002    Family  History  Problem Relation Age of Onset  . Cancer Mother 81    throat (nonsmoker)  . Heart disease Maternal Aunt   . Alcohol abuse Father     deceased 49yo    Social History   Social History  . Marital status: Married    Spouse name: N/A  . Number of children: N/A  . Years of education: N/A   Occupational History  . 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     Social History Main Topics  . Smoking status: Former Smoker    Packs/day: 2.00    Years: 40.00    Types: Cigarettes    Quit date: 08/31/2005  . Smokeless tobacco: Former Systems developer    Types: Snuff, Chew  . Alcohol use 0.0 oz/week     Comment: rarely  . Drug use: No  . Sexual activity: Yes    Partners: Female   Other Topics Concern  . Not on file   Social History Narrative   Lives with wife   Occ:  semi-retired Corporate treasurer, works for [employer]   Edu: HS   Activity: no regular exercise   Diet: no water, sweet tea, some fruits/vegetables   Review of Systems  No rash No vomiting or diarrhea Appetite is okay     Objective:   Physical Exam  Constitutional: No distress.  Coarse cough  Neck: No thyromegaly present.  Pulmonary/Chest: Effort normal. No respiratory distress. He has no wheezes. He has no rales.  Slightly decreased breath sounds but not really tight  Lymphadenopathy:    He has no cervical adenopathy.          Assessment & Plan:

## 2016-12-15 NOTE — Progress Notes (Signed)
Pre visit review using our clinic review tool, if applicable. No additional management support is needed unless otherwise documented below in the visit note. 

## 2016-12-22 ENCOUNTER — Ambulatory Visit (INDEPENDENT_AMBULATORY_CARE_PROVIDER_SITE_OTHER): Payer: Medicare Other | Admitting: Family Medicine

## 2016-12-22 ENCOUNTER — Encounter: Payer: Self-pay | Admitting: Family Medicine

## 2016-12-22 VITALS — BP 140/80 | HR 70 | Temp 98.2°F | Wt 329.5 lb

## 2016-12-22 DIAGNOSIS — R05 Cough: Secondary | ICD-10-CM

## 2016-12-22 DIAGNOSIS — D170 Benign lipomatous neoplasm of skin and subcutaneous tissue of head, face and neck: Secondary | ICD-10-CM

## 2016-12-22 DIAGNOSIS — I10 Essential (primary) hypertension: Secondary | ICD-10-CM

## 2016-12-22 DIAGNOSIS — E785 Hyperlipidemia, unspecified: Secondary | ICD-10-CM | POA: Diagnosis not present

## 2016-12-22 DIAGNOSIS — Z9889 Other specified postprocedural states: Secondary | ICD-10-CM | POA: Diagnosis not present

## 2016-12-22 DIAGNOSIS — R059 Cough, unspecified: Secondary | ICD-10-CM

## 2016-12-22 DIAGNOSIS — Z6841 Body Mass Index (BMI) 40.0 and over, adult: Secondary | ICD-10-CM

## 2016-12-22 DIAGNOSIS — Z0000 Encounter for general adult medical examination without abnormal findings: Secondary | ICD-10-CM | POA: Diagnosis not present

## 2016-12-22 DIAGNOSIS — J42 Unspecified chronic bronchitis: Secondary | ICD-10-CM | POA: Diagnosis not present

## 2016-12-22 DIAGNOSIS — Z8679 Personal history of other diseases of the circulatory system: Secondary | ICD-10-CM | POA: Diagnosis not present

## 2016-12-22 DIAGNOSIS — Z7189 Other specified counseling: Secondary | ICD-10-CM | POA: Diagnosis not present

## 2016-12-22 DIAGNOSIS — Z0001 Encounter for general adult medical examination with abnormal findings: Secondary | ICD-10-CM | POA: Insufficient documentation

## 2016-12-22 MED ORDER — HYDROCOD POLST-CPM POLST ER 10-8 MG/5ML PO SUER: 5.0000 mL | Freq: Every evening | ORAL | 0 refills | Status: DC | PRN

## 2016-12-22 MED ORDER — TIOTROPIUM BROMIDE MONOHYDRATE 18 MCG IN CAPS: 18.0000 ug | ORAL_CAPSULE | Freq: Every day | RESPIRATORY_TRACT | 6 refills | Status: DC

## 2016-12-22 NOTE — Addendum Note (Signed)
Addended by: Ria Bush on: 12/22/2016 08:16 PM   Modules accepted: Level of Service

## 2016-12-22 NOTE — Assessment & Plan Note (Signed)
Preventative protocols reviewed and updated unless pt declined. Discussed healthy diet and lifestyle.  

## 2016-12-22 NOTE — Assessment & Plan Note (Signed)
Chronic, stable. Continue current regimen. BP improved on recheck.  

## 2016-12-22 NOTE — Progress Notes (Signed)
Pre visit review using our clinic review tool, if applicable. No additional management support is needed unless otherwise documented below in the visit note. 

## 2016-12-22 NOTE — Assessment & Plan Note (Addendum)
Reviewed recent labs and diet changes to affect improved triglyceride levels

## 2016-12-22 NOTE — Progress Notes (Addendum)
BP 140/80   Pulse 70   Temp 98.2 F (36.8 C) (Oral)   Wt (!) 329 lb 8 oz (149.5 kg)   SpO2 96%   BMI 45.96 kg/m    CC: CPE Subjective:    Patient ID: William Hebert, male    DOB: 1946/05/01, 71 y.o.   MRN: [id number]  HPI: William Hebert is a 71 y.o. male presenting on 12/22/2016 for Annual Exam   Saw Katha Cabal last month for medicare wellness visit. Note reviewed.   Chronic bronchitis with acute exacerbation - latest treated by Dr Silvio Pate with prednisone and doxy. He was also treated with albuterol inhaler. All these have helped. Persistent intermittent cough but overall improved.   Preventative: Colonoscopy 2008 Collene Mares). States Dr Collene Mares recommended against rpt colonoscopy due to h/o endovascular AAA repair? Discussed options, requests cologuard.  Prostate cancer screening - declines DRE.  Lung cancer screening - discussed, declines.  Flu shot - not done  Td 2015 Pneumovax 2017, prevnar 2015 Shingrix - declines Advanced directive discussion - would want daughter [name] to be HCPOA. packet provided last month.  Seat belt use discussed Sunscreen use discussed. No changing moles on skin. Sees derm regularly. 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 Interim medical history since our last visit reviewed. Allergies and medications reviewed and updated. Outpatient Medications Prior to Visit  Medication Sig Dispense Refill  . albuterol (PROVENTIL HFA;VENTOLIN HFA) 108 (90 Base) MCG/ACT inhaler Inhale 2 puffs into the lungs every 6 (six) hours as needed for wheezing or shortness of breath. 1 Inhaler 0  . Ascorbic Acid (VITAMIN C) 1000 MG tablet Take 1,000 mg by mouth daily.    [name] aspirin 81 MG tablet Take 81 mg  by mouth daily.      . cholecalciferol (VITAMIN D) 1000 units tablet Take 1,000 Units by mouth daily.    . diclofenac sodium (VOLTAREN) 1 % GEL Apply 2 g topically 4 (four) times daily. 100 g 1  . furosemide (LASIX) 40 MG tablet TAKE 1 TABLET BY MOUTH  DAILY 90 tablet 1  . labetalol (NORMODYNE) 200 MG tablet Take 200 mg by mouth daily.    . Misc Natural Products (OSTEO BI-FLEX ADV DOUBLE ST PO) Take by mouth.      . Multiple Vitamins-Minerals (MULTIVITAMIN ADULT PO) Take 1 tablet by mouth daily.    [name] NIFEdipine (PROCARDIA XL/ADALAT-CC) 60 MG 24 hr tablet TAKE 1 TABLET BY MOUTH  DAILY 90 tablet 1  . NON FORMULARY Tumeric with pepper    . omeprazole (PRILOSEC) 20 MG capsule Take 1 capsule by mouth two times daily 180 capsule 1  . potassium chloride SA (K-DUR,KLOR-CON) 20 MEQ tablet TAKE 1 TABLET BY MOUTH  DAILY 90 tablet 1  . pravastatin (PRAVACHOL) 80 MG tablet TAKE 1 TABLET BY MOUTH  DAILY 90 tablet 1  . Spacer/Aero-Holding Chambers [facility] DIAMOND) DEVI Use as directed with albuterol inhaler 1 Device 0  . Zinc 100 MG TABS Take 1 tablet by mouth daily.    . chlorpheniramine-HYDROcodone (TUSSIONEX PENNKINETIC ER) 10-8 MG/5ML SUER Take 5 mLs by mouth at bedtime as needed. (Patient not taking: Reported on 12/22/2016) 70 mL 0  . doxycycline (VIBRA-TABS) 100 MG tablet Take 1 tablet (100 mg total) by  mouth 2 (two) times daily. (Patient not taking: Reported on 12/22/2016) 14 tablet 0  . predniSONE (DELTASONE) 20 MG tablet Take 2 tablets (40 mg total) by mouth daily. (Patient not taking: Reported on 12/22/2016) 10 tablet 0   No facility-administered medications prior to visit.      Per HPI unless specifically indicated in ROS section below Review of Systems  Constitutional: Positive for chills. Negative for activity change, appetite change, fatigue, fever and unexpected weight change.  HENT: Negative for hearing loss.   Eyes: Negative for visual disturbance.  Respiratory: Positive for cough,  shortness of breath and wheezing. Negative for chest tightness.   Cardiovascular: Positive for leg swelling. Negative for chest pain and palpitations.  Gastrointestinal: Negative for abdominal distention, abdominal pain, blood in stool, constipation, diarrhea, nausea and vomiting.  Genitourinary: Negative for difficulty urinating and hematuria.  Musculoskeletal: Negative for arthralgias, myalgias and neck pain.  Skin: Negative for rash.  Neurological: Positive for dizziness. Negative for seizures, syncope and headaches.  Hematological: Negative for adenopathy. Does not bruise/bleed easily.  Psychiatric/Behavioral: Negative for dysphoric mood. The patient is not nervous/anxious.        Objective:    BP 140/80   Pulse 70   Temp 98.2 F (36.8 C) (Oral)   Wt (!) 329 lb 8 oz (149.5 kg)   SpO2 96%   BMI 45.96 kg/m   Wt Readings from Last 3 Encounters:  12/22/16 (!) 329 lb 8 oz (149.5 kg)  12/15/16 (!) 334 lb 4 oz (151.6 kg)  11/25/16 (!) 333 lb 12 oz (151.4 kg)    Physical Exam  Constitutional: He is oriented to person, place, and time. He appears well-developed and well-nourished. No distress.  HENT:  Head: Normocephalic and atraumatic.  Right Ear: Hearing, tympanic membrane, external ear and ear canal normal.  Left Ear: Hearing, tympanic membrane, external ear and ear canal normal.  Nose: Nose normal.  Mouth/Throat: Uvula is midline, oropharynx is clear and moist and mucous membranes are normal. No oropharyngeal exudate, posterior oropharyngeal edema or posterior oropharyngeal erythema.  Eyes: Conjunctivae and EOM are normal. Pupils are equal, round, and reactive to light. No scleral icterus.  Neck: Normal range of motion. Neck supple. Carotid bruit is not present. No thyromegaly present.  Cardiovascular: Normal rate, regular rhythm, normal heart sounds and intact distal pulses.   No murmur heard. Pulses:      Radial pulses are 2+ on the right side, and 2+ on the left side.    Pulmonary/Chest: Effort normal and breath sounds normal. No respiratory distress. He has no wheezes. He has no rales.  Coarse breath sounds  Abdominal: Soft. Bowel sounds are normal. He exhibits no distension and no mass. There is no tenderness. There is no rebound and no guarding.  Musculoskeletal: Normal range of motion. He exhibits no edema.  Lymphadenopathy:    He has no cervical adenopathy.  Neurological: He is alert and oriented to person, place, and time.  CN grossly intact, station and gait intact  Skin: Skin is warm and dry. No rash noted.  Psychiatric: He has a normal mood and affect. His behavior is normal. Judgment and thought content normal.  Nursing note and vitals reviewed.  Results for orders placed or performed in visit on 11/26/16  Hemoglobin A1c  Result Value Ref Range   Hgb A1c MFr Bld 6.3 4.6 - 6.5 %   Lab Results  Component Value Date   CHOL 131 11/25/2016   HDL 30.80 (L) 11/25/2016   [place]  61 09/10/2015   LDLDIRECT 69.0 11/25/2016   TRIG 247.0 (H) 11/25/2016   CHOLHDL 4 11/25/2016       Assessment & Plan:   Problem List Items Addressed This Visit    Advanced care planning/counseling discussion    Advanced directive discussion - would want daughter [name] to be HCPOA. packet provided last month.       Body mass index (BMI) of 45.0 to 49.9 in adult [facility])    Discussed healthy diet and lifestyle changes to affect sustainable weight loss.      Chronic bronchitis ([place])    Saw pulm 2012 dx with classic upper airway cough syndrome.  Persistent recurrent bronchitis in smoker and CXR with hyperinflation consistent with mild COPD - anticipate COPD: chronic bronchitis, discussed this with patient.  rec start spiriva nightly. rec return for spirometry.  Will refill tussionex for ongoing cough. No signs of persistent bacterial infection so no abx prescribed at this time.       RESOLVED: Cough   Relevant Medications   chlorpheniramine-HYDROcodone  (TUSSIONEX PENNKINETIC ER) 10-8 MG/5ML SUER   Dyslipidemia    Reviewed recent labs and diet changes to affect improved triglyceride levels       Health maintenance examination - Primary    Preventative protocols reviewed and updated unless pt declined. Discussed healthy diet and lifestyle.       Hypertension    Chronic, stable. Continue current regimen. BP improved on recheck.       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      S/P AAA (abdominal aortic aneurysm) repair    s/p endovascular repair followed by [facility]          Follow up plan: Return in about 3 months (around 03/23/2017) for follow up visit.  Ria Bush, MD

## 2016-12-22 NOTE — Assessment & Plan Note (Signed)
Discussed healthy diet and lifestyle changes to affect sustainable weight loss  

## 2016-12-22 NOTE — Assessment & Plan Note (Signed)
Saw pulm 2012 dx with classic upper airway cough syndrome.  Persistent recurrent bronchitis in smoker and CXR with hyperinflation consistent with mild COPD - anticipate COPD: chronic bronchitis, discussed this with patient.  rec start spiriva nightly. rec return for spirometry.  Will refill tussionex for ongoing cough. No signs of persistent bacterial infection so no abx prescribed at this time.

## 2016-12-22 NOTE — Assessment & Plan Note (Signed)
s/p endovascular repair followed by [facility]

## 2016-12-22 NOTE — Assessment & Plan Note (Signed)
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

## 2016-12-22 NOTE — Assessment & Plan Note (Signed)
Advanced directive discussion - would want daughter [name] to be HCPOA. packet provided last month.

## 2016-12-22 NOTE — Patient Instructions (Addendum)
We will sign you up for cologuard.  Think about lung cancer screening CT scan.  Continue albuterol as needed for wheezing or shortness of breath. Cough syrup refilled. Start spiriva nightly - have pharmacist demonstrate use.  Return as needed or in 2-3 months for spirometry (lung function testing).   Health Maintenance, Male A healthy lifestyle and preventive care is important for your health and wellness. Ask your health care provider about what schedule of regular examinations is right for you. What should I know about weight and diet?  Eat a Healthy Diet  Eat plenty of vegetables, fruits, whole grains, low-fat dairy products, and lean protein.  Do not eat a lot of foods high in solid fats, added sugars, or salt. Maintain a Healthy Weight  Regular exercise can help you achieve or maintain a healthy weight. You should:  Do at least 150 minutes of exercise each week. The exercise should increase your heart rate and make you sweat (moderate-intensity exercise).  Do strength-training exercises at least twice a week. Watch Your Levels of Cholesterol and Blood Lipids  Have your blood tested for lipids and cholesterol every 5 years starting at 69 years of age. If you are at high risk for heart disease, you should start having your blood tested when you are 69 years old. You may need to have your cholesterol levels checked more often if:  Your lipid or cholesterol levels are high.  You are older than 69 years of age.  You are at high risk for heart disease. What should I know about cancer screening? Many types of cancers can be detected early and may often be prevented. Lung Cancer  You should be screened every year for lung cancer if:  You are a current smoker who has smoked for at least 30 years.  You are a former smoker who has quit within the past 15 years.  Talk to your health care provider about your screening options, when you should start screening, and how often you should  be screened. Colorectal Cancer  Routine colorectal cancer screening usually begins at 69 years of age and should be repeated every 5-10 years until you are 69 years old. You may need to be screened more often if early forms of precancerous polyps or small growths are found. Your health care provider may recommend screening at an earlier age if you have risk factors for colon cancer.  Your health care provider may recommend using home test kits to check for hidden blood in the stool.  A small camera at the end of a tube can be used to examine your colon (sigmoidoscopy or colonoscopy). This checks for the earliest forms of colorectal cancer. Prostate and Testicular Cancer  Depending on your age and overall health, your health care provider may do certain tests to screen for prostate and testicular cancer.  Talk to your health care provider about any symptoms or concerns you have about testicular or prostate cancer. Skin Cancer  Check your skin from head to toe regularly.  Tell your health care provider about any new moles or changes in moles, especially if:  There is a change in a mole's size, shape, or color.  You have a mole that is larger than a pencil eraser.  Always use sunscreen. Apply sunscreen liberally and repeat throughout the day.  Protect yourself by wearing long sleeves, pants, a wide-brimmed hat, and sunglasses when outside. What should I know about heart disease, diabetes, and high blood pressure?  If you are  34-36 years of age, have your blood pressure checked every 3-5 years. If you are 15 years of age or older, have your blood pressure checked every year. You should have your blood pressure measured twice-once when you are at a hospital or clinic, and once when you are not at a hospital or clinic. Record the average of the two measurements. To check your blood pressure when you are not at a hospital or clinic, you can use:  An automated blood pressure machine at a  pharmacy.  A home blood pressure monitor.  Talk to your health care provider about your target blood pressure.  If you are between 42-44 years old, ask your health care provider if you should take aspirin to prevent heart disease.  Have regular diabetes screenings by checking your fasting blood sugar level.  If you are at a normal weight and have a low risk for diabetes, have this test once every three years after the age of 67.  If you are overweight and have a high risk for diabetes, consider being tested at a younger age or more often.  A one-time screening for abdominal aortic aneurysm (AAA) by ultrasound is recommended for men aged 85-75 years who are current or former smokers. What should I know about preventing infection? Hepatitis B  If you have a higher risk for hepatitis B, you should be screened for this virus. Talk with your health care provider to find out if you are at risk for hepatitis B infection. Hepatitis C  Blood testing is recommended for:  Everyone born from [birth year] through 1963.  Anyone with known risk factors for hepatitis C. Sexually Transmitted Diseases (STDs)  You should be screened each year for STDs including gonorrhea and chlamydia if:  You are sexually active and are younger than 69 years of age.  You are older than 69 years of age and your health care provider tells you that you are at risk for this type of infection.  Your sexual activity has changed since you were last screened and you are at an increased risk for chlamydia or gonorrhea. Ask your health care provider if you are at risk.  Talk with your health care provider about whether you are at high risk of being infected with HIV. Your health care provider may recommend a prescription medicine to help prevent HIV infection. What else can I do?  Schedule regular health, dental, and eye exams.  Stay current with your vaccines (immunizations).  Do not use any tobacco products, such as  cigarettes, chewing tobacco, and e-cigarettes. If you need help quitting, ask your health care provider.  Limit alcohol intake to no more than 2 drinks per day. One drink equals 12 ounces of beer, 5 ounces of wine, or 1 ounces of hard liquor.  Do not use street drugs.  Do not share needles.  Ask your health care provider for help if you need support or information about quitting drugs.  Tell your health care provider if you often feel depressed.  Tell your health care provider if you have ever been abused or do not feel safe at home. This information is not intended to replace advice given to you by your health care provider. Make sure you discuss any questions you have with your health care provider. Document Released: 02/13/2008 Document Revised: 04/15/2016 Document Reviewed: 05/21/2015 Elsevier Interactive Patient Education  2017 [employer].

## 2016-12-31 ENCOUNTER — Encounter: Payer: Self-pay | Admitting: Family Medicine

## 2017-01-06 LAB — COLOGUARD: Cologuard: POSITIVE

## 2017-01-07 ENCOUNTER — Telehealth: Payer: Self-pay | Admitting: Family Medicine

## 2017-01-07 DIAGNOSIS — R195 Other fecal abnormalities: Secondary | ICD-10-CM

## 2017-01-07 NOTE — Telephone Encounter (Signed)
Pt called again, requesting a cb

## 2017-01-07 NOTE — Telephone Encounter (Signed)
plz notify cologuard returned positive - recommend GI referral to at least discuss options. Referral placed. Has previously seen Dr Collene Mares. cologuard results in CMA box for scanning.

## 2017-01-07 NOTE — Telephone Encounter (Signed)
Pt returned missed call. Can reach him on [phone].

## 2017-01-07 NOTE — Telephone Encounter (Signed)
Noted  

## 2017-01-07 NOTE — Telephone Encounter (Signed)
Left message to return call 

## 2017-01-07 NOTE — Telephone Encounter (Signed)
Patient notified and would like referral to Dr.Mann, in the afternoon and not on fridays

## 2017-01-08 NOTE — Telephone Encounter (Signed)
Appt made with Dr Collene Mares and patient aware.

## 2017-01-20 ENCOUNTER — Other Ambulatory Visit: Payer: Self-pay | Admitting: Gastroenterology

## 2017-02-19 ENCOUNTER — Encounter (HOSPITAL_COMMUNITY): Payer: Self-pay | Admitting: *Deleted

## 2017-02-23 ENCOUNTER — Ambulatory Visit (HOSPITAL_COMMUNITY): Payer: Medicare Other | Admitting: Registered Nurse

## 2017-02-23 ENCOUNTER — Encounter (HOSPITAL_COMMUNITY): Payer: Self-pay

## 2017-02-23 ENCOUNTER — Ambulatory Visit (HOSPITAL_COMMUNITY)
Admission: RE | Admit: 2017-02-23 | Discharge: 2017-02-23 | Disposition: A | Discharge status: Home / Self Care | Payer: Medicare Other | Source: Ambulatory Visit | Attending: Gastroenterology | Admitting: Gastroenterology

## 2017-02-23 ENCOUNTER — Encounter (HOSPITAL_COMMUNITY)
Admission: RE | Disposition: A | Discharge status: Home / Self Care | Payer: Self-pay | Source: Ambulatory Visit | Attending: Gastroenterology

## 2017-02-23 DIAGNOSIS — D120 Benign neoplasm of cecum: Secondary | ICD-10-CM | POA: Insufficient documentation

## 2017-02-23 DIAGNOSIS — E785 Hyperlipidemia, unspecified: Secondary | ICD-10-CM | POA: Diagnosis not present

## 2017-02-23 DIAGNOSIS — G4730 Sleep apnea, unspecified: Secondary | ICD-10-CM | POA: Insufficient documentation

## 2017-02-23 DIAGNOSIS — D122 Benign neoplasm of ascending colon: Secondary | ICD-10-CM | POA: Diagnosis not present

## 2017-02-23 DIAGNOSIS — Z87891 Personal history of nicotine dependence: Secondary | ICD-10-CM | POA: Diagnosis not present

## 2017-02-23 DIAGNOSIS — I10 Essential (primary) hypertension: Secondary | ICD-10-CM | POA: Insufficient documentation

## 2017-02-23 DIAGNOSIS — K219 Gastro-esophageal reflux disease without esophagitis: Secondary | ICD-10-CM | POA: Diagnosis not present

## 2017-02-23 DIAGNOSIS — Z1211 Encounter for screening for malignant neoplasm of colon: Secondary | ICD-10-CM | POA: Insufficient documentation

## 2017-02-23 DIAGNOSIS — Z7982 Long term (current) use of aspirin: Secondary | ICD-10-CM | POA: Insufficient documentation

## 2017-02-23 DIAGNOSIS — Z6841 Body Mass Index (BMI) 40.0 and over, adult: Secondary | ICD-10-CM | POA: Insufficient documentation

## 2017-02-23 HISTORY — PX: COLONOSCOPY WITH PROPOFOL: SHX5780

## 2017-02-23 HISTORY — DX: Ventral hernia without obstruction or gangrene: K43.9

## 2017-02-23 HISTORY — DX: Other specified postprocedural states: R11.2

## 2017-02-23 HISTORY — DX: Other specified postprocedural states: Z98.890

## 2017-02-23 HISTORY — DX: Unspecified chronic bronchitis: J42

## 2017-02-23 HISTORY — DX: Sleep apnea, unspecified: G47.30

## 2017-02-23 LAB — GLUCOSE, CAPILLARY: Glucose-Capillary: 111 mg/dL — ABNORMAL HIGH (ref 65–99)

## 2017-02-23 SURGERY — COLONOSCOPY WITH PROPOFOL
Anesthesia: Monitor Anesthesia Care

## 2017-02-23 MED ORDER — ONDANSETRON HCL 4 MG/2ML IJ SOLN
INTRAMUSCULAR | Status: AC
  Filled 2017-02-23: qty 2

## 2017-02-23 MED ORDER — PROPOFOL 10 MG/ML IV BOLUS
INTRAVENOUS | Status: AC
  Filled 2017-02-23: qty 60

## 2017-02-23 MED ORDER — ONDANSETRON HCL 4 MG/2ML IJ SOLN
INTRAMUSCULAR | Status: DC | PRN
  Administered 2017-02-23: 4 mg via INTRAVENOUS

## 2017-02-23 MED ORDER — LACTATED RINGERS IV SOLN
INTRAVENOUS | Status: DC
  Administered 2017-02-23: 1000 mL via INTRAVENOUS

## 2017-02-23 MED ORDER — SODIUM CHLORIDE 0.9 % IV SOLN: INTRAVENOUS | Status: DC

## 2017-02-23 MED ORDER — PROPOFOL 500 MG/50ML IV EMUL
INTRAVENOUS | Status: DC | PRN
  Administered 2017-02-23: 200 ug/kg/min via INTRAVENOUS

## 2017-02-23 SURGICAL SUPPLY — 22 items

## 2017-02-23 NOTE — H&P (Signed)
William Hebert is an 71 y.o. male.   Chief Complaint: Colorectal cancer screening. HPI: 71 year old white male here for colorectal cancer screening. See office notes for details.  Past Medical History:  Diagnosis Date  . AAA (abdominal aortic aneurysm) without rupture (William Hebert) 07/10/2014   S/p endovascular repair 2012 with stent William Hebert), yearly f/u  . Arthritis   . Chronic bronchitis (William Hebert) 11/2016 AND 10-2016  . GERD (gastroesophageal reflux disease)   . Headache(784.0)   . Hernia of abdominal wall    LARGE FROM BREAST BONE AREA TO BELLY BUTTON  . History of skin cancer 2014  . HTN (hypertension)   . Hyperlipidemia   . Hypogonadism in male    prior on testosterone cream  . Morbidly obese (William Hebert)   . PONV (postoperative nausea and vomiting)    PONV AFTER AAA REPAIR, ALL OTHER SURGERIES WENT OK  . Shortness of breath    WITH EXERTION  . Sleep apnea    CPAP    Past Surgical History:  Procedure Laterality Date  . ABDOMINAL AORTIC ANEURYSM REPAIR  05/20/2011   aorto bifem. stent graft William Hebert)  . CATARACT EXTRACTION Bilateral 2014   X 2 BOTH EYES DONE TWICE  . ELBOW SURGERY  2012  . FOOT TENDON SURGERY Right   . KNEE SURGERY  x 2  . MASS EXCISION N/A 05/10/2013   Procedure: EXCISION back MASS;  Surgeon: William Faster. Cornett, MD;  Location: [facility];  Service: General;  Laterality: N/A;  . SKIN SURGERY     3 different surgeries for melanoma (unclear)  . UMBILICAL HERNIA REPAIR  2002   Family History  Problem Relation Age of Onset  . Cancer Mother 30       throat (nonsmoker)  . Heart disease Maternal Aunt   . Alcohol abuse Father        deceased 71yo   Social History:  reports that he quit smoking about 11 years ago. His smoking use included Cigarettes. He has a 80.00 pack-year smoking history. He has quit using smokeless tobacco. His smokeless tobacco use included Snuff and Chew. He reports that he drinks alcohol. He reports that he does not use  drugs.  Allergies: No Known Allergies  Medications Prior to Admission  Medication Sig Dispense Refill  . albuterol (PROVENTIL HFA;VENTOLIN HFA) 108 (90 Base) MCG/ACT inhaler Inhale 2 puffs into the lungs every 6 (six) hours as needed for wheezing or shortness of breath. 1 Inhaler 0  . aspirin 81 MG tablet Take 81 mg by mouth daily.      . diclofenac sodium (VOLTAREN) 1 % GEL Apply 1 application topically 3 (three) times daily as needed (PAIN).    . furosemide (LASIX) 40 MG tablet TAKE 1 TABLET BY MOUTH  DAILY 90 tablet 1  . labetalol (NORMODYNE) 200 MG tablet Take 200 mg by mouth daily.    . Misc Natural Products (OSTEO BI-FLEX ADV TRIPLE ST) TABS Take 1 tablet by mouth daily.    . Multiple Vitamins-Minerals (MULTIVITAMIN ADULT PO) Take 1 tablet by mouth daily.    [name] NIFEdipine (PROCARDIA XL/ADALAT-CC) 60 MG 24 hr tablet TAKE 1 TABLET BY MOUTH  DAILY 90 tablet 1  . NON FORMULARY Take 1 capsule by mouth daily. Tumeric with pepper     . omeprazole (PRILOSEC) 20 MG capsule Take 1 capsule by mouth two times daily 180 capsule 1  . potassium chloride SA (K-DUR,KLOR-CON) 20 MEQ tablet TAKE 1 TABLET BY MOUTH  DAILY 90  tablet 1  . pravastatin (PRAVACHOL) 80 MG tablet TAKE 1 TABLET BY MOUTH  DAILY 90 tablet 1  . tiotropium (SPIRIVA HANDIHALER) 18 MCG inhalation capsule Place 1 capsule (18 mcg total) into inhaler and inhale at bedtime. (Patient taking differently: Place 18 mcg into inhaler and inhale daily as needed. ) 30 capsule 6  . Zinc 100 MG TABS Take 1 tablet by mouth daily.     No results found for this or any previous visit (from the past 48 hour(s)). No results found.  Review of Systems  Constitutional: Negative.   HENT: Negative.   Eyes: Negative.   Respiratory: Negative.   Cardiovascular: Negative.   Gastrointestinal: Positive for heartburn. Negative for abdominal pain, nausea and vomiting.  Genitourinary: Negative.   Musculoskeletal: Positive for joint pain.  Skin: Negative.    Neurological: Negative.   Endo/Heme/Allergies: Negative.   Psychiatric/Behavioral: Negative.    Blood pressure (!) 174/90, pulse 65, temperature 98 F (36.7 C), temperature source Oral, resp. rate 14, height 5\' 11"  (1.803 m), weight (!) 149.2 kg (329 lb), SpO2 95 %. Physical Exam  Constitutional: He is oriented to person, place, and time. He appears well-developed and well-nourished.  Morbidly obese  HENT:  Head: Normocephalic and atraumatic.  Eyes: Conjunctivae and EOM are normal. Pupils are equal, round, and reactive to light.  Neck: Normal range of motion. Neck supple.  Cardiovascular: Normal rate and regular rhythm.   Respiratory: Effort normal and breath sounds normal.  GI: Soft. Bowel sounds are normal.  Musculoskeletal: Normal range of motion.  Neurological: He is alert and oriented to person, place, and time.  Skin: Skin is warm and dry.  Psychiatric: He has a normal mood and affect. His behavior is normal. Judgment and thought content normal.    Assessment/Plan Colorectal cancer screening: proceed with a colonoscopy at this time. Conan Mcmanaway, MD 02/23/2017, 7:11 AM

## 2017-02-23 NOTE — Anesthesia Preprocedure Evaluation (Signed)
Anesthesia Evaluation  Patient identified by MRN, date of birth, ID band Patient awake    Reviewed: Allergy & Precautions, H&P , NPO status , Patient's Chart, lab work & pertinent test results  History of Anesthesia Complications (+) PONV  Airway Mallampati: II  TM Distance: >3 FB Neck ROM: Full    Dental no notable dental hx. (+) Teeth Intact, Dental Advisory Given   Pulmonary sleep apnea and Continuous Positive Airway Pressure Ventilation , former smoker,    Pulmonary exam normal breath sounds clear to auscultation       Cardiovascular hypertension, Pt. on medications and Pt. on home beta blockers Normal cardiovascular exam Rhythm:Regular Rate:Normal     Neuro/Psych negative neurological ROS  negative psych ROS   GI/Hepatic Neg liver ROS, GERD  Medicated and Controlled,  Endo/Other  Morbid obesity  Renal/GU negative Renal ROS  negative genitourinary   Musculoskeletal   Abdominal (+) + obese,   Peds negative pediatric ROS (+)  Hematology negative hematology ROS (+)   Anesthesia Other Findings   Reproductive/Obstetrics negative OB ROS                             Anesthesia Physical  Anesthesia Plan  ASA: III  Anesthesia Plan: MAC   Post-op Pain Management:    Induction: Intravenous  PONV Risk Score and Plan: 2 and Ondansetron and Dexamethasone  Airway Management Planned: Natural Airway and Simple Face Mask  Additional Equipment:   Intra-op Plan:   Post-operative Plan:   Informed Consent: I have reviewed the patients History and Physical, chart, labs and discussed the procedure including the risks, benefits and alternatives for the proposed anesthesia with the patient or authorized representative who has indicated his/her understanding and acceptance.     Plan Discussed with: CRNA and Surgeon  Anesthesia Plan Comments:         Anesthesia Quick Evaluation

## 2017-02-23 NOTE — Op Note (Signed)
[facility] Patient Name: William Hebert Procedure Date: 02/23/2017 MRN: [id number] Attending MD: Juanita Craver , MD Date of Birth: 1946-07-09 CSN: [id number] Age: 71 Admit Type: Outpatient Procedure:                Colonoscopy with biopsies & snare polypwectomy x 6. Indications:              CRC screening for colorectal malignant neoplasm. Providers:                Juanita Craver, MD, Elmer Ramp. Tilden Dome, RN, William Dalton, Technician, Enrigue Catena, CRNA. Referring MD:             Ria Bush, MD Medicines:                Monitored anesthesia care. Complications:            No immediate complications. Estimated Blood Loss:     Estimated blood loss: minimal. Procedure:                Pre-anesthesia assessment: - Prior to the                            procedure, a history and physical was performed,                            and patient medications and allergies were                            reviewed. The patient's tolerance of previous                            anesthesia was also reviewed. The risks and                            benefits of the procedure and the sedation options                            and risks were discussed with the patient. All                            questions were answered, and informed consent was                            obtained. Prior anticoagulants: The patient has                            taken aspirin, last dose was 5 days prior to                            procedure. ASA Grade assessment: III - A patient                            with severe systemic disease. After reviewing the  risks and benefits, the patient was deemed in                            satisfactory condition to undergo the procedure.                            After obtaining informed consent, the colonoscope                            was passed under direct vision. Throughout the   procedure, the patient's blood pressure, pulse, and                            oxygen saturations were monitored continuously. The                            EC-[id number] ([id number]) scope was introduced through                            the anus and advanced to the the terminal ileum,                            with identification of the appendiceal orifice and                            IC valve. The colonoscopy was performed without                            difficulty. The patient tolerated the procedure                            well. The quality of the bowel preparation was                            good. The terminal ileum, the ileocecal valve, the                            appendiceal orifice and the rectum were                            photographed. The bowel preparation used was                            GoLYTELY. Scope In: 7:20:10 AM Scope Out: 7:41:00 AM Scope Withdrawal Time: 0 hours 16 minutes 39 seconds  Total Procedure Duration: 0 hours 20 minutes 50 seconds  Findings:      Four 6-7-8 mm sessile polyps were found, 3 in the proximal ascending       colon and 1 in the cecum-these polyps were removed with a hot snare x       6-200/20; resection and retrieval were complete.      Two small sessile polyps were found, 1 in the ascending colon, proximal       ascending colon and 1 in the cecum-these were removed by cold biopsies.  The terminal ileum appeared normal.      No additional abnormalities were found on retroflexion. Impression:               - Four polyps, 3 in the proximal ascending colon                            and 1 in the cecum, removed with a hot snare x 6;                            resected and retrieved.                           - Two small sessile polyps, 1 in the proximal                            ascending colon and 1 in the cecum-removed by cold                            biopsies.                           - The examined portion of the ileum  was normal. Moderate Sedation:      MAC given. Recommendation:           - High fiber diet with augmented water consumption                            daily.                           - Continue present medications.                           - Await pathology results.                           - No Ibuprofen, Naproxen, or other non-steroidal                            anti-inflammatory drugs for 2 weeks after polyp                            removal.                           - Repeat colonoscopy in 3 - 5 years for                            surveillance.                           - Return to GI office PRN.                           - If the patient has any abnormal GI symptoms in  the interim, he has been advised to call the office                            ASAP for further recommendations. Procedure Code(s):        --- Professional ---                           [id number], Colonoscopy, flexible; with removal of                            tumor(s), polyp(s), or other lesion(s) by snare                            technique Diagnosis Code(s):        --- Professional ---                           D12.0, Benign neoplasm of cecum                           D12.2, Benign neoplasm of ascending colon                           Z12.11, Encounter for screening for malignant                            neoplasm of colon CPT copyright 2016 American Medical Association. All rights reserved. The codes documented in this report are preliminary and upon coder review may  be revised to meet current compliance requirements. Juanita Craver, MD Juanita Craver, MD 02/23/2017 7:54:44 AM This report has been signed electronically. Number of Addenda: 0

## 2017-02-23 NOTE — Transfer of Care (Signed)
Immediate Anesthesia Transfer of Care Note  Patient: William Hebert  Procedure(s) Performed: Procedure(s): COLONOSCOPY WITH PROPOFOL (N/A)  Patient Location: PACU  Anesthesia Type:MAC  Level of Consciousness: awake, alert , oriented and patient cooperative  Airway & Oxygen Therapy: Patient Spontanous Breathing and Patient connected to face mask oxygen  Post-op Assessment: Report given to RN, Post -op Vital signs reviewed and stable and Patient moving all extremities X 4  Post vital signs: stable  Last Vitals:  Vitals:   02/23/17 0622 02/23/17 0748  BP: (!) 174/90 (!) 173/84  Pulse: 65 73  Resp: 14 19  Temp: 36.7 C 36.8 C    Last Pain:  Vitals:   02/23/17 0748  TempSrc: Oral         Complications: No apparent anesthesia complications

## 2017-02-23 NOTE — Discharge Instructions (Signed)
Colonoscopy, Adult, Care After  This sheet gives you information about how to care for yourself after your procedure. Your health care provider may also give you more specific instructions. If you have problems or questions, contact your health care provider.  What can I expect after the procedure?  After the procedure, it is common to have:  · A small amount of blood in your stool for 24 hours after the procedure.  · Some gas.  · Mild abdominal cramping or bloating.    Follow these instructions at home:  General instructions    · For the first 24 hours after the procedure:  ? Do not drive or use machinery.  ? Do not sign important documents.  ? Do not drink alcohol.  ? Do your regular daily activities at a slower pace than normal.  ? Eat soft, easy-to-digest foods.  ? Rest often.  · Take over-the-counter or prescription medicines only as told by your health care provider.  · It is up to you to get the results of your procedure. Ask your health care provider, or the department performing the procedure, when your results will be ready.  Relieving cramping and bloating  · Try walking around when you have cramps or feel bloated.  · Apply heat to your abdomen as told by your health care provider. Use a heat source that your health care provider recommends, such as a moist heat pack or a heating pad.  ? Place a towel between your skin and the heat source.  ? Leave the heat on for 20-30 minutes.  ? Remove the heat if your skin turns bright red. This is especially important if you are unable to feel pain, heat, or cold. You may have a greater risk of getting burned.  Eating and drinking  · Drink enough fluid to keep your urine clear or pale yellow.  · Resume your normal diet as instructed by your health care provider. Avoid heavy or fried foods that are hard to digest.  · Avoid drinking alcohol for as long as instructed by your health care provider.  Contact a health care provider if:  · You have blood in your stool 2-3  days after the procedure.  Get help right away if:  · You have more than a small spotting of blood in your stool.  · You pass large blood clots in your stool.  · Your abdomen is swollen.  · You have nausea or vomiting.  · You have a fever.  · You have increasing abdominal pain that is not relieved with medicine.  This information is not intended to replace advice given to you by your health care provider. Make sure you discuss any questions you have with your health care provider.  Document Released: 03/31/2004 Document Revised: 05/11/2016 Document Reviewed: 10/29/2015  Elsevier Interactive Patient Education © 2018 [employer]

## 2017-02-23 NOTE — Anesthesia Postprocedure Evaluation (Signed)
Anesthesia Post Note  Patient: William Hebert  Procedure(s) Performed: Procedure(s) (LRB): COLONOSCOPY WITH PROPOFOL (N/A)     Patient location during evaluation: Endoscopy Anesthesia Type: MAC Level of consciousness: awake Vital Signs Assessment: post-procedure vital signs reviewed and stable Respiratory status: spontaneous breathing Cardiovascular status: stable Postop Assessment: no signs of nausea or vomiting Anesthetic complications: no    Last Vitals:  Vitals:   02/23/17 0800 02/23/17 0810  BP: (!) 173/97 (!) 183/92  Pulse: 68 68  Resp: 17 18  Temp:      Last Pain:  Vitals:   02/23/17 0748  TempSrc: Oral   Pain Goal:                 [name],JOHN Lorance Pickeral

## 2017-02-24 ENCOUNTER — Encounter: Payer: Self-pay | Admitting: Family Medicine

## 2017-02-25 ENCOUNTER — Telehealth: Payer: Self-pay | Admitting: Family Medicine

## 2017-02-25 NOTE — Telephone Encounter (Signed)
Wife stopped by, to talk with you about husbands awv and cpe.  The cpe was paid 100% but the awv was no covered.Wife is requesting cb about this thanks   [phone]

## 2017-02-26 ENCOUNTER — Encounter (HOSPITAL_COMMUNITY): Payer: Self-pay | Admitting: Gastroenterology

## 2017-06-05 ENCOUNTER — Encounter: Payer: Self-pay | Admitting: Family Medicine

## 2017-06-05 ENCOUNTER — Telehealth: Payer: Self-pay | Admitting: Family Medicine

## 2017-06-05 NOTE — Telephone Encounter (Signed)
I received request for preop clearance for R shoulder surgery by Dr Erasmo Score- would like him to come in for this. 30 min appt.  I will hold on to paperwork

## 2017-06-07 NOTE — Telephone Encounter (Signed)
Spoke with pt's wife, Carlyon Shadow (on dpr), relaying message per Dr. Darnell Level. Says she will cb tomorrow to schedule.

## 2017-06-11 ENCOUNTER — Other Ambulatory Visit: Payer: Self-pay | Admitting: Family Medicine

## 2017-06-11 NOTE — Telephone Encounter (Signed)
Pt has appt Mon, 06/14/17. How many refills do you want to allow?

## 2017-06-11 NOTE — Telephone Encounter (Signed)
Had CPE 11/2016. May send in with 1 refill.

## 2017-06-14 ENCOUNTER — Encounter: Payer: Self-pay | Admitting: Family Medicine

## 2017-06-14 ENCOUNTER — Ambulatory Visit (INDEPENDENT_AMBULATORY_CARE_PROVIDER_SITE_OTHER): Payer: Medicare Other | Admitting: Family Medicine

## 2017-06-14 ENCOUNTER — Ambulatory Visit (INDEPENDENT_AMBULATORY_CARE_PROVIDER_SITE_OTHER)
Admission: RE | Admit: 2017-06-14 | Discharge: 2017-06-14 | Disposition: A | Discharge status: Home / Self Care | Payer: Medicare Other | Source: Ambulatory Visit | Attending: Family Medicine | Admitting: Family Medicine

## 2017-06-14 VITALS — BP 138/70 | HR 81 | Temp 97.9°F | Wt 339.5 lb

## 2017-06-14 DIAGNOSIS — G4733 Obstructive sleep apnea (adult) (pediatric): Secondary | ICD-10-CM

## 2017-06-14 DIAGNOSIS — Z9889 Other specified postprocedural states: Secondary | ICD-10-CM

## 2017-06-14 DIAGNOSIS — E559 Vitamin D deficiency, unspecified: Secondary | ICD-10-CM | POA: Diagnosis not present

## 2017-06-14 DIAGNOSIS — Z8679 Personal history of other diseases of the circulatory system: Secondary | ICD-10-CM | POA: Diagnosis not present

## 2017-06-14 DIAGNOSIS — Z01818 Encounter for other preprocedural examination: Secondary | ICD-10-CM | POA: Diagnosis not present

## 2017-06-14 DIAGNOSIS — I10 Essential (primary) hypertension: Secondary | ICD-10-CM | POA: Diagnosis not present

## 2017-06-14 DIAGNOSIS — R7303 Prediabetes: Secondary | ICD-10-CM | POA: Diagnosis not present

## 2017-06-14 DIAGNOSIS — E785 Hyperlipidemia, unspecified: Secondary | ICD-10-CM | POA: Diagnosis not present

## 2017-06-14 DIAGNOSIS — E118 Type 2 diabetes mellitus with unspecified complications: Secondary | ICD-10-CM | POA: Insufficient documentation

## 2017-06-14 DIAGNOSIS — E1169 Type 2 diabetes mellitus with other specified complication: Secondary | ICD-10-CM | POA: Insufficient documentation

## 2017-06-14 DIAGNOSIS — Z6841 Body Mass Index (BMI) 40.0 and over, adult: Secondary | ICD-10-CM | POA: Diagnosis not present

## 2017-06-14 DIAGNOSIS — E1165 Type 2 diabetes mellitus with hyperglycemia: Secondary | ICD-10-CM | POA: Insufficient documentation

## 2017-06-14 DIAGNOSIS — J42 Unspecified chronic bronchitis: Secondary | ICD-10-CM

## 2017-06-14 MED ORDER — ALBUTEROL SULFATE (2.5 MG/3ML) 0.083% IN NEBU
2.5000 mg | INHALATION_SOLUTION | Freq: Once | RESPIRATORY_TRACT | Status: AC
Start: 2017-06-14 — End: 2017-06-14
  Administered 2017-06-14: 2.5 mg via RESPIRATORY_TRACT

## 2017-06-14 NOTE — Assessment & Plan Note (Addendum)
Chronic, stable. Continue pravastatin 80mg  daily.

## 2017-06-14 NOTE — Assessment & Plan Note (Signed)
RCRI score = 0. Low risk surgery and per patient not planning GETA.  Should be ok to proceed without need for cardiac evaluation.  Check EKG today, CXR, labs today.  Check spirometry today.

## 2017-06-14 NOTE — Progress Notes (Addendum)
BP 138/70 (BP Location: Right Arm, Patient Position: Sitting, Cuff Size: Large)   Pulse 81   Temp 97.9 F (36.6 C) (Oral)   Wt (!) 339 lb 8 oz (154 kg)   SpO2 97%   BMI 47.35 kg/m    CC: preop evaluation Subjective:    Patient ID: William Hebert, male    DOB: 08/08/1946, 71 y.o.   MRN: [id number]  HPI: William Hebert is a 71 y.o. male presenting on 06/14/2017 for Pre-op Exam   Here for preop evaluation for upcoming R shoulder rotator cuff tear repair by Dr Susa Day at [facility] ortho, surgery date TBD. Pt states he will only have local anesthesia with nerve block.   Sedentary lifestyle. Weight gain noted. Dyspnea with weed eating yard. Denies dyspnea with 1 flight of stairs. Denies chest pain, palpitations, dizziness.   Has tolerated prior GETA, has had multiple ortho procedures.  PONV x1, other surgeries did well.   H/o AAA s/p endovascular repair 2012 Kellie Simmering).  H/o OSA on CPAP, doesn't know settings, sleeps with it 6-8 hours a night. Has not recently seen pulm. Ex smoker - quit 2007.  No significant alcohol use.   H/o chronic bronchitis latest treatment 11/2016 with prednisone/doxy course. Not on daily respiratory medication. He never returned for spirometry. He did not start spiriva. Denies significant dyspnea. Some chronic cough. Some ongoing headaches and lower back pain.   Relevant past medical, surgical, family and social history reviewed and updated as indicated. Interim medical history since our last visit reviewed. Allergies and medications reviewed and updated. Outpatient Medications Prior to Visit  Medication Sig Dispense Refill  . Multiple Vitamins-Minerals (MULTIVITAMIN ADULT PO) Take 1 tablet by mouth daily. MEN'S 50+ MULTIVITAMIN.    [name] Zinc 100 MG TABS Take 1 tablet by mouth daily.    [name] albuterol (PROVENTIL HFA;VENTOLIN HFA) 108 (90 Base) MCG/ACT inhaler Inhale 2 puffs into the lungs every 6 (six) hours as needed for wheezing or shortness of breath. 1 Inhaler 0    . furosemide (LASIX) 40 MG tablet TAKE 1 TABLET BY MOUTH  DAILY 90 tablet 1  . labetalol (NORMODYNE) 200 MG tablet Take 200 mg by mouth daily.    . Misc Natural Products (OSTEO BI-FLEX ADV TRIPLE ST) TABS Take 1 tablet by mouth daily. WITH TUMERIC    . NIFEdipine (PROCARDIA XL/ADALAT-CC) 60 MG 24 hr tablet TAKE 1 TABLET BY MOUTH  DAILY 90 tablet 1  . NON FORMULARY Take 1 capsule by mouth daily. Tumeric with pepper     . omeprazole (PRILOSEC) 20 MG capsule TAKE 1 CAPSULE BY MOUTH TWO TIMES DAILY (Patient taking differently: TAKE 1 CAPSULE BY MOUTH ONCE DAILY IN THE MORNING) 180 capsule 1  . potassium chloride SA (K-DUR,KLOR-CON) 20 MEQ tablet TAKE 1 TABLET BY MOUTH  DAILY 90 tablet 1  . pravastatin (PRAVACHOL) 80 MG tablet TAKE 1 TABLET BY MOUTH  DAILY (Patient taking differently: TAKE 1 TABLET BY MOUTH  DAILY AT NIGHT) 90 tablet 1  . diclofenac sodium (VOLTAREN) 1 % GEL Apply 1 application topically 3 (three) times daily as needed (PAIN).    [name] tiotropium (SPIRIVA HANDIHALER) 18 MCG inhalation capsule Place 1 capsule (18 mcg total) into inhaler and inhale at bedtime. (Patient not taking: Reported on 06/14/2017) 30 capsule 6   No facility-administered medications prior to visit.      Per HPI unless specifically indicated in ROS section below Review of Systems     Objective:    BP  138/70 (BP Location: Right Arm, Patient Position: Sitting, Cuff Size: Large)   Pulse 81   Temp 97.9 F (36.6 C) (Oral)   Wt (!) 339 lb 8 oz (154 kg)   SpO2 97%   BMI 47.35 kg/m   Wt Readings from Last 3 Encounters:  02/11/18 (!) 337 lb 12 oz (153.2 kg)  08/02/17 (!) 339 lb 1.6 oz (153.8 kg)  07/21/17 (!) 342 lb (155.1 kg)    Physical Exam  Constitutional: He appears well-developed and well-nourished. No distress.  HENT:  Mouth/Throat: Oropharynx is clear and moist. No oropharyngeal exudate.  Eyes: Pupils are equal, round, and reactive to light. Conjunctivae are normal.  Neck: Normal range of motion.  Neck supple. No thyromegaly present.  Cardiovascular: Normal rate, regular rhythm and intact distal pulses.   No murmur heard. Distant heart sounds  Pulmonary/Chest: Effort normal and breath sounds normal. No respiratory distress. He has no wheezes. He has no rales.  Musculoskeletal: He exhibits edema (1+ chronic).  Skin: Skin is warm and dry. No rash noted.  Psychiatric: He has a normal mood and affect.  Nursing note and vitals reviewed.  Results for orders placed or performed in visit on 06/14/17  Hemoglobin A1c  Result Value Ref Range   Hgb A1c MFr Bld 6.5 4.6 - 6.5 %  CBC with Differential/Platelet  Result Value Ref Range   WBC 7.6 4.0 - 10.5 K/uL   RBC 4.53 4.22 - 5.81 Mil/uL   Hemoglobin 14.1 13.0 - 17.0 g/dL   HCT 41.8 39.0 - 52.0 %   MCV 92.1 78.0 - 100.0 fl   MCHC 33.8 30.0 - 36.0 g/dL   RDW 14.1 11.5 - 15.5 %   Platelets 198.0 150.0 - 400.0 K/uL   Neutrophils Relative % 66.9 43.0 - 77.0 %   Lymphocytes Relative 20.0 12.0 - 46.0 %   Monocytes Relative 9.9 3.0 - 12.0 %   Eosinophils Relative 2.3 0.0 - 5.0 %   Basophils Relative 0.9 0.0 - 3.0 %   Neutro Abs 5.1 1.4 - 7.7 K/uL   Lymphs Abs 1.5 0.7 - 4.0 K/uL   Monocytes Absolute 0.8 0.1 - 1.0 K/uL   Eosinophils Absolute 0.2 0.0 - 0.7 K/uL   Basophils Absolute 0.1 0.0 - 0.1 K/uL  Basic metabolic panel  Result Value Ref Range   Sodium 141 135 - 145 mEq/L   Potassium 3.4 (L) 3.5 - 5.1 mEq/L   Chloride 104 96 - 112 mEq/L   CO2 25 19 - 32 mEq/L   Glucose, Bld 99 70 - 99 mg/dL   BUN 19 6 - 23 mg/dL   Creatinine, Ser 1.28 0.40 - 1.50 mg/dL   Calcium 9.2 8.4 - 10.5 mg/dL   GFR 59.11 (L) >60.00 mL/min  VITAMIN D 25 Hydroxy (Vit-D Deficiency, Fractures)  Result Value Ref Range   VITD 17.39 (L) 30.00 - 100.00 ng/mL  Protime-INR  Result Value Ref Range   INR 1.0 0.8 - 1.0 ratio   Prothrombin Time 11.1 9.6 - 13.1 sec   DG Chest 2 View CLINICAL DATA:  Bronchitis, cough.  EXAM: CHEST - 2 VIEW  COMPARISON:   Radiographs of June 14, 2017.  FINDINGS: Stable cardiomediastinal silhouette. No pneumothorax or pleural effusion is noted. Mild bibasilar opacities are noted which may represent subsegmental atelectasis secondary to hypoinflation of the lungs. Bony thorax is unremarkable.  IMPRESSION: Hypoinflation of the lungs is noted with mild bibasilar subsegmental atelectasis.  Electronically Signed   By: Marijo Conception, M.D.  On: 02/14/2018 17:22   EKG - incomplete RBBB, NSR rate 70s, normal intervals, without acute ST/T changes, unchanged from prior EKG 2015.    Assessment & Plan:  Flu shot declined today.  Over 40 minutes were spent face-to-face with the patient during this encounter and >50% of that time was spent on counseling and coordination of care  Problem List Items Addressed This Visit    Vitamin D deficiency   Relevant Orders   VITAMIN D 25 Hydroxy (Vit-D Deficiency, Fractures) (Completed)   S/P AAA (abdominal aortic aneurysm) repair    Stable period, followed by [facility] Simmering)      Prediabetes    Update A1c.       Relevant Orders   Hemoglobin A1c (Completed)   Pre-op evaluation - Primary    RCRI score = 0. Low risk surgery and per patient not planning GETA.  Should be ok to proceed without need for cardiac evaluation.  Check EKG today, CXR, labs today.  Check spirometry today.      Relevant Orders   DG Chest 2 View (Completed)   EKG 12-Lead (Completed)   CBC with Differential/Platelet (Completed)   Basic metabolic panel (Completed)   Protime-INR (Completed)   Obstructive sleep apnea    Prior followed by Dr Gray Bernhardt, no recent evaluation however he does endorse regular CPAP use - rec continue this perioperatively. This continues to be medically necessary to control his OSA.       Hypertension    Chronic, stable on current antihypertensive regimen.       Dyslipidemia    Chronic, stable. Continue pravastatin 80mg  daily.      Chronic bronchitis ([place])     H/o this. Check spirometry today - pre-albuterol was overall normal but poor quality graph due to likely extra breath - inaccurate results.  Consider repeated. Pt not currently on respiratory treatment, denies significant dyspnea.       Relevant Orders   Spirometry: Pre & Post Eval (Completed)   Body mass index (BMI) of 45.0 to 49.9 in adult [facility])    Morbid obesity complicates upcoming surgery. Pt states he will not have GETA.           Follow up plan: Return if symptoms worsen or fail to improve.  Ria Bush, MD

## 2017-06-14 NOTE — Assessment & Plan Note (Addendum)
H/o this. Check spirometry today - pre-albuterol was overall normal but poor quality graph due to likely extra breath - inaccurate results.  Consider repeated. Pt not currently on respiratory treatment, denies significant dyspnea.

## 2017-06-14 NOTE — Assessment & Plan Note (Signed)
Update A1c ?

## 2017-06-14 NOTE — Assessment & Plan Note (Deleted)
Stable period, followed by [facility] Kellie Simmering)

## 2017-06-14 NOTE — Patient Instructions (Addendum)
Chest xray today, EKG and spirometry today Labs today.  We will be in touch with results and fax today's evaluation to Dr Tonita Cong. Good to see you today, call us with questions.

## 2017-06-14 NOTE — Assessment & Plan Note (Signed)
Chronic, stable on current antihypertensive regimen.

## 2017-06-14 NOTE — Assessment & Plan Note (Addendum)
Prior followed by Dr Gray Bernhardt, no recent evaluation however he does endorse regular CPAP use - rec continue this perioperatively. This continues to be medically necessary to control his OSA.

## 2017-06-14 NOTE — Assessment & Plan Note (Addendum)
Morbid obesity complicates upcoming surgery. Pt states he will not have GETA.

## 2017-06-14 NOTE — Assessment & Plan Note (Signed)
Stable period, followed by [facility] Simmer[facility])

## 2017-06-15 LAB — CBC WITH DIFFERENTIAL/PLATELET
Basophils Absolute: 0.1 10*3/uL (ref 0.0–0.1)
Basophils Relative: 0.9 % (ref 0.0–3.0)
Eosinophils Absolute: 0.2 10*3/uL (ref 0.0–0.7)
Eosinophils Relative: 2.3 % (ref 0.0–5.0)
HCT: 41.8 % (ref 39.0–52.0)
Hemoglobin: 14.1 g/dL (ref 13.0–17.0)
Lymphocytes Relative: 20 % (ref 12.0–46.0)
Lymphs Abs: 1.5 10*3/uL (ref 0.7–4.0)
MCHC: 33.8 g/dL (ref 30.0–36.0)
MCV: 92.1 fl (ref 78.0–100.0)
Monocytes Absolute: 0.8 10*3/uL (ref 0.1–1.0)
Monocytes Relative: 9.9 % (ref 3.0–12.0)
Neutro Abs: 5.1 10*3/uL (ref 1.4–7.7)
Neutrophils Relative %: 66.9 % (ref 43.0–77.0)
Platelets: 198 10*3/uL (ref 150.0–400.0)
RBC: 4.53 Mil/uL (ref 4.22–5.81)
RDW: 14.1 % (ref 11.5–15.5)
WBC: 7.6 10*3/uL (ref 4.0–10.5)

## 2017-06-15 LAB — BASIC METABOLIC PANEL
BUN: 19 mg/dL (ref 6–23)
CO2: 25 mEq/L (ref 19–32)
Calcium: 9.2 mg/dL (ref 8.4–10.5)
Chloride: 104 mEq/L (ref 96–112)
Creatinine, Ser: 1.28 mg/dL (ref 0.40–1.50)
GFR: 59.11 mL/min — ABNORMAL LOW (ref >60.00)
Glucose, Bld: 99 mg/dL (ref 70–99)
Potassium: 3.4 mEq/L — ABNORMAL LOW (ref 3.5–5.1)
Sodium: 141 mEq/L (ref 135–145)

## 2017-06-15 LAB — VITAMIN D 25 HYDROXY (VIT D DEFICIENCY, FRACTURES): VITD: 17.39 ng/mL — ABNORMAL LOW (ref 30.00–100.00)

## 2017-06-15 LAB — HEMOGLOBIN A1C: Hgb A1c MFr Bld: 6.5 % (ref 4.6–6.5)

## 2017-06-15 LAB — PROTIME-INR
INR: 1 ratio (ref 0.8–1.0)
Prothrombin Time: 11.1 s (ref 9.6–13.1)

## 2017-06-17 ENCOUNTER — Telehealth: Payer: Self-pay

## 2017-06-17 MED ORDER — VITAMIN D3 1.25 MG (50000 UT) PO TABS: 1.0000 | ORAL_TABLET | ORAL | 1 refills | Status: DC

## 2017-06-17 NOTE — Telephone Encounter (Signed)
William Hebert (DPR signed)left v/m requesting prescription strength Vit d sent to [place]; pt seen 06/14/17. If need to cb call pt at [phone].

## 2017-06-17 NOTE — Telephone Encounter (Signed)
sent in

## 2017-06-21 ENCOUNTER — Other Ambulatory Visit: Payer: Self-pay | Admitting: Family Medicine

## 2017-06-25 ENCOUNTER — Ambulatory Visit: Payer: Self-pay | Admitting: Orthopedic Surgery

## 2017-06-28 ENCOUNTER — Ambulatory Visit: Payer: Self-pay | Admitting: Orthopedic Surgery

## 2017-06-28 NOTE — H&P (View-Only) (Signed)
William Hebert is an 71 y.o. male.   Chief Complaint: right shoulder pain HPI: The patient is a 71 year old male who presents today for follow up of their shoulder. The patient is being followed for their right shoulder pain. They are 3 month(s) out from injury. Symptoms reported today include: pain. and report their pain level to be 5 / 10 (but can be 10/10). Current treatment includes: pain medications (OTC). The following medication has been used for pain control: Tylenol. The patient presents today following MRI.  William Hebert follows up for shoulder pain, worse with activity, better with rest, three months status post his injury. He is having difficulty raising the arm. This was occurred after an injury.  Past Medical History:  Diagnosis Date  . AAA (abdominal aortic aneurysm) without rupture ([place]) 07/10/2014   S/p endovascular repair 2012 with stent [facility] Simmering), yearly f/u  . Arthritis   . Chronic bronchitis ([place]) 11/2016 AND 10-2016  . GERD (gastroesophageal reflux disease)   . Headache(784.0)   . Hernia of abdominal wall    LARGE FROM BREAST BONE AREA TO BELLY BUTTON  . History of skin cancer 2014  . HTN (hypertension)   . Hyperlipidemia   . Hypogonadism in male    prior on testosterone cream  . Morbidly obese ([place])   . PONV (postoperative nausea and vomiting)    PONV AFTER AAA REPAIR, ALL OTHER SURGERIES WENT OK  . Shortness of breath    WITH EXERTION  . Sleep apnea    CPAP     Past Surgical History:  Procedure Laterality Date  . ABDOMINAL AORTIC ANEURYSM REPAIR  05/20/2011   aorto bifem. stent graft [facility] Simmering)  . CATARACT EXTRACTION Bilateral 2014   X 2 BOTH EYES DONE TWICE  . COLONOSCOPY WITH PROPOFOL N/A 02/23/2017   4 TA polyps, rpt 3-5 yrs ([place])  . ELBOW SURGERY  2012  . FOOT TENDON SURGERY Right   . KNEE SURGERY Right remote   arthroscopy x2  . MASS EXCISION N/A 05/10/2013   Procedure: EXCISION back MASS - cyst Marcello Moores A. Cornett, MD)  . SKIN SURGERY     3  different surgeries for melanoma (unclear)  . UMBILICAL HERNIA REPAIR  2002    Family History  Problem Relation Age of Onset  . Cancer Mother 102       throat (nonsmoker)  . Heart disease Maternal Aunt   . Alcohol abuse Father        deceased 52yo   Social History:  reports that he quit smoking about 11 years ago. His smoking use included Cigarettes. He has a 80.00 pack-year smoking history. He has quit using smokeless tobacco. His smokeless tobacco use included Snuff and Chew. He reports that he drinks alcohol. He reports that he does not use drugs.  Allergies: No Known Allergies   (Not in a hospital admission)  No results found for this or any previous visit (from the past 48 hour(s)). No results found.  Review of Systems  Constitutional: Negative.   HENT: Negative.   Eyes: Negative.   Respiratory: Negative.   Cardiovascular: Negative.   Gastrointestinal: Negative.   Genitourinary: Negative.   Musculoskeletal: Positive for joint pain.  Skin: Negative.   Neurological: Negative.   Psychiatric/Behavioral: Negative.     There were no vitals taken for this visit. Physical Exam  Constitutional: He is oriented to person, place, and time.  obese  HENT:  Head: Normocephalic.  Eyes: Pupils are equal, round, and reactive to  light.  Neck: Normal range of motion.  Cardiovascular: Normal rate.   Respiratory: Effort normal.  GI: Soft.  Musculoskeletal:  On exam, tender in the anterior subacromial region. Positive impingement sign. Positive second impingement sign of the shoulder. Nontender over the [facility].  Nontender over the biceps.  Neurological: He is alert and oriented to person, place, and time.    MRI demonstrates a tear of the supraspinatus and infraspinatus junction, 60% of the tendon, on the MRI.  Assessment/Plan Rotator cuff tear, 60% through the tendon interstitial supraspinatus and infraspinatus.  We had an extensive discussion concerning current pathology,  relevant anatomy, and treatment option. We discussed living with them and monitoring versus surgical intervention. They have some time constraints over the next six months. We discussed repeat injection. We will proceed with that. After discussing risks and benefits with the patient, we proceeded with a shoulder injection. I sterilely prepped the posterior subacromial region with Betadine and alcohol and injected the patient with 1 mL of Aristospan and 8 mL of 1% lidocaine solution with reduction of pain. Post-injection care was discussed with the patient. If he still significant symptomatic after that, they are to call and we can discuss mini open rotator cuff repair on the right with suture anchors and the risks and benefits were discussed with that. I had a long discussion with the patient concerning the risks and benefits of a shoulder rotator cuff repair, including bleeding, infection, prolonged postoperative recovery, which may require 3 to 5 months until maximum medical improvement. Overnight procedure with initiation of early passive range of motion within physical therapy. Avoid any active motion for the first six weeks. This is all in an effort to avoid recurrent tear of the rotator cuff and adhesive capsulitis. Return to work without use of the arm can be obtained following two weeks. However, driving will be a challenge. We also discussed the possibility of requiring implants including bone anchors, as well as an Allograft patch graft if a massive rotator cuff tear is encountered. Removal of any bones for spurs as well as bursitis will be performed during the procedure and also any associated anesthetic complications as well. He will let me know if the injection helps him significantly. We discussed avoiding impingement type scenarios and further monitoring to avoid a retracted rotator cuff tear.  Plan right shoulder mini-open RCR, possible patch graft  Cecilie Kicks., PA-C for Dr.  Tonita Cong 06/28/2017, 8:51 AM

## 2017-06-28 NOTE — H&P (Signed)
William Hebert is an 71 y.o. male.   Chief Complaint: right shoulder pain HPI: The patient is a 71 year old male who presents today for follow up of their shoulder. The patient is being followed for their right shoulder pain. They are 3 month(s) out from injury. Symptoms reported today include: pain. and report their pain level to be 5 / 10 (but can be 10/10). Current treatment includes: pain medications (OTC). The following medication has been used for pain control: Tylenol. The patient presents today following MRI.  William Hebert follows up for shoulder pain, worse with activity, better with rest, three months status post his injury. He is having difficulty raising the arm. This was occurred after an injury.  Past Medical History:  Diagnosis Date  . AAA (abdominal aortic aneurysm) without rupture ([place]) 07/10/2014   S/p endovascular repair 2012 with stent Kellie Simmering), yearly f/u  . Arthritis   . Chronic bronchitis ([place]) 11/2016 AND 10-2016  . GERD (gastroesophageal reflux disease)   . Headache(784.0)   . Hernia of abdominal wall    LARGE FROM BREAST BONE AREA TO BELLY BUTTON  . History of skin cancer 2014  . HTN (hypertension)   . Hyperlipidemia   . Hypogonadism in male    prior on testosterone cream  . Morbidly obese ([place])   . PONV (postoperative nausea and vomiting)    PONV AFTER AAA REPAIR, ALL OTHER SURGERIES WENT OK  . Shortness of breath    WITH EXERTION  . Sleep apnea    CPAP     Past Surgical History:  Procedure Laterality Date  . ABDOMINAL AORTIC ANEURYSM REPAIR  05/20/2011   aorto bifem. stent graft Kellie Simmering)  . CATARACT EXTRACTION Bilateral 2014   X 2 BOTH EYES DONE TWICE  . COLONOSCOPY WITH PROPOFOL N/A 02/23/2017   4 TA polyps, rpt 3-5 yrs ([place])  . ELBOW SURGERY  2012  . FOOT TENDON SURGERY Right   . KNEE SURGERY Right remote   arthroscopy x2  . MASS EXCISION N/A 05/10/2013   Procedure: EXCISION back MASS - cyst William Moores A. Cornett, MD)  . SKIN SURGERY     3  different surgeries for melanoma (unclear)  . UMBILICAL HERNIA REPAIR  2002    Family History  Problem Relation Age of Onset  . Cancer Mother 64       throat (nonsmoker)  . Heart disease Maternal Aunt   . Alcohol abuse Father        deceased 71yo   Social History:  reports that he quit smoking about 11 years ago. His smoking use included Cigarettes. He has a 80.00 pack-year smoking history. He has quit using smokeless tobacco. His smokeless tobacco use included Snuff and Chew. He reports that he drinks alcohol. He reports that he does not use drugs.  Allergies: No Known Allergies   (Not in a hospital admission)  No results found for this or any previous visit (from the past 48 hour(s)). No results found.  Review of Systems  Constitutional: Negative.   HENT: Negative.   Eyes: Negative.   Respiratory: Negative.   Cardiovascular: Negative.   Gastrointestinal: Negative.   Genitourinary: Negative.   Musculoskeletal: Positive for joint pain.  Skin: Negative.   Neurological: Negative.   Psychiatric/Behavioral: Negative.     There were no vitals taken for this visit. Physical Exam  Constitutional: He is oriented to person, place, and time.  obese  HENT:  Head: Normocephalic.  Eyes: Pupils are equal, round, and reactive to  light.  Neck: Normal range of motion.  Cardiovascular: Normal rate.   Respiratory: Effort normal.  GI: Soft.  Musculoskeletal:  On exam, tender in the anterior subacromial region. Positive impingement sign. Positive second impingement sign of the shoulder. Nontender over the [facility].  Nontender over the biceps.  Neurological: He is alert and oriented to person, place, and time.    MRI demonstrates a tear of the supraspinatus and infraspinatus junction, 60% of the tendon, on the MRI.  Assessment/Plan Rotator cuff tear, 60% through the tendon interstitial supraspinatus and infraspinatus.  We had an extensive discussion concerning current pathology,  relevant anatomy, and treatment option. We discussed living with them and monitoring versus surgical intervention. They have some time constraints over the next six months. We discussed repeat injection. We will proceed with that. After discussing risks and benefits with the patient, we proceeded with a shoulder injection. I sterilely prepped the posterior subacromial region with Betadine and alcohol and injected the patient with 1 mL of Aristospan and 8 mL of 1% lidocaine solution with reduction of pain. Post-injection care was discussed with the patient. If he still significant symptomatic after that, they are to call and we can discuss mini open rotator cuff repair on the right with suture anchors and the risks and benefits were discussed with that. I had a long discussion with the patient concerning the risks and benefits of a shoulder rotator cuff repair, including bleeding, infection, prolonged postoperative recovery, which may require 3 to 5 months until maximum medical improvement. Overnight procedure with initiation of early passive range of motion within physical therapy. Avoid any active motion for the first six weeks. This is all in an effort to avoid recurrent tear of the rotator cuff and adhesive capsulitis. Return to work without use of the arm can be obtained following two weeks. However, driving will be a challenge. We also discussed the possibility of requiring implants including bone anchors, as well as an Allograft patch graft if a massive rotator cuff tear is encountered. Removal of any bones for spurs as well as bursitis will be performed during the procedure and also any associated anesthetic complications as well. He will let me know if the injection helps him significantly. We discussed avoiding impingement type scenarios and further monitoring to avoid a retracted rotator cuff tear.  Plan right shoulder mini-open RCR, possible patch graft  Cecilie Kicks., PA-C for Dr.  Tonita Cong 06/28/2017, 8:51 AM

## 2017-07-02 NOTE — Progress Notes (Signed)
ekg 06/14/17 epic  labs 06/14/17 epic cbc/bmp/hgba1c/ vit d, pt/inr  cxr 06/14/17 epic

## 2017-07-02 NOTE — Patient Instructions (Addendum)
William Hebert  07/02/2017   Your procedure is scheduled on: 07/08/17  Report to [facility] Main  Entrance Take [place]  elevators to 3rd floor to  [place] at     [place] AM.    Call this number if you have problems the morning of surgery [phone]    Remember: ONLY 1 PERSON MAY GO WITH YOU TO SHORT STAY TO GET  READY MORNING OF [place].   Do not eat food or drink liquids :After Midnight.     Take these medicines the morning of surgery with A SIP OF WATER: omeprazole, procardia, labatelol                                You may not have any metal on your body including hair pins and              piercings  Do not wear jewelry,lotions, powders or perfumes, deodorant                  Men may shave face and neck.   Do not bring valuables to the hospital. [place].  Contacts, dentures or bridgework may not be worn into surgery.  Leave suitcase in the car. After surgery it may be brought to your room.                 Please read over the following fact sheets you were given: _____________________________________________________________________          [facility] - Preparing for Surgery Before surgery, you can play an important role.  Because skin is not sterile, your skin needs to be as free of germs as possible.  You can reduce the number of germs on your skin by washing with CHG (chlorahexidine gluconate) soap before surgery.  CHG is an antiseptic cleaner which kills germs and bonds with the skin to continue killing germs even after washing. Please DO NOT use if you have an allergy to CHG or antibacterial soaps.  If your skin becomes reddened/irritated stop using the CHG and inform your nurse when you arrive at Short Stay. Do not shave (including legs and underarms) for at least 48 hours prior to the first CHG shower.  You may shave your face/neck. Please follow these instructions carefully:  1.   Shower with CHG Soap the night before surgery and the  morning of Surgery.  2.  If you choose to wash your hair, wash your hair first as usual with your  normal  shampoo.  3.  After you shampoo, rinse your hair and body thoroughly to remove the  shampoo.                           4.  Use CHG as you would any other liquid soap.  You can apply chg directly  to the skin and wash                       Gently with a scrungie or clean washcloth.  5.  Apply the CHG Soap to your body ONLY FROM THE NECK DOWN.   Do not use on face/ open  Wound or open sores. Avoid contact with eyes, ears mouth and genitals (private parts).                       Wash face,  Genitals (private parts) with your normal soap.             6.  Wash thoroughly, paying special attention to the area where your surgery  will be performed.  7.  Thoroughly rinse your body with warm water from the neck down.  8.  DO NOT shower/wash with your normal soap after using and rinsing off  the CHG Soap.                9.  Pat yourself dry with a clean towel.            10.  Wear clean pajamas.            11.  Place clean sheets on your bed the night of your first shower and do not  sleep with pets. Day of Surgery : Do not apply any lotions/deodorants the morning of surgery.  Please wear clean clothes to the hospital/surgery center.  FAILURE TO FOLLOW THESE INSTRUCTIONS MAY RESULT IN THE CANCELLATION OF YOUR SURGERY PATIENT SIGNATURE_________________________________  NURSE SIGNATURE__________________________________  ________________________________________________________________________

## 2017-07-06 ENCOUNTER — Other Ambulatory Visit: Payer: Self-pay

## 2017-07-06 ENCOUNTER — Encounter (HOSPITAL_COMMUNITY): Payer: Self-pay

## 2017-07-06 ENCOUNTER — Encounter (HOSPITAL_COMMUNITY)
Admission: RE | Admit: 2017-07-06 | Discharge: 2017-07-06 | Disposition: A | Discharge status: Home / Self Care | Payer: Medicare Other | Source: Ambulatory Visit | Attending: Specialist | Admitting: Specialist

## 2017-07-06 DIAGNOSIS — I714 Abdominal aortic aneurysm, without rupture: Secondary | ICD-10-CM | POA: Diagnosis not present

## 2017-07-06 DIAGNOSIS — Z7982 Long term (current) use of aspirin: Secondary | ICD-10-CM | POA: Diagnosis not present

## 2017-07-06 DIAGNOSIS — M1990 Unspecified osteoarthritis, unspecified site: Secondary | ICD-10-CM | POA: Diagnosis not present

## 2017-07-06 DIAGNOSIS — M75111 Incomplete rotator cuff tear or rupture of right shoulder, not specified as traumatic: Secondary | ICD-10-CM | POA: Diagnosis present

## 2017-07-06 DIAGNOSIS — Z85820 Personal history of malignant melanoma of skin: Secondary | ICD-10-CM | POA: Diagnosis not present

## 2017-07-06 DIAGNOSIS — I739 Peripheral vascular disease, unspecified: Secondary | ICD-10-CM | POA: Diagnosis not present

## 2017-07-06 DIAGNOSIS — J449 Chronic obstructive pulmonary disease, unspecified: Secondary | ICD-10-CM | POA: Diagnosis not present

## 2017-07-06 DIAGNOSIS — Z6841 Body Mass Index (BMI) 40.0 and over, adult: Secondary | ICD-10-CM | POA: Diagnosis not present

## 2017-07-06 DIAGNOSIS — I10 Essential (primary) hypertension: Secondary | ICD-10-CM | POA: Diagnosis not present

## 2017-07-06 DIAGNOSIS — K219 Gastro-esophageal reflux disease without esophagitis: Secondary | ICD-10-CM | POA: Diagnosis not present

## 2017-07-06 DIAGNOSIS — M7541 Impingement syndrome of right shoulder: Secondary | ICD-10-CM | POA: Diagnosis not present

## 2017-07-06 DIAGNOSIS — E785 Hyperlipidemia, unspecified: Secondary | ICD-10-CM | POA: Diagnosis not present

## 2017-07-06 DIAGNOSIS — X58XXXA Exposure to other specified factors, initial encounter: Secondary | ICD-10-CM | POA: Diagnosis not present

## 2017-07-06 DIAGNOSIS — Z9989 Dependence on other enabling machines and devices: Secondary | ICD-10-CM | POA: Diagnosis not present

## 2017-07-06 DIAGNOSIS — Z79899 Other long term (current) drug therapy: Secondary | ICD-10-CM | POA: Diagnosis not present

## 2017-07-06 DIAGNOSIS — I4510 Unspecified right bundle-branch block: Secondary | ICD-10-CM | POA: Diagnosis not present

## 2017-07-06 DIAGNOSIS — E291 Testicular hypofunction: Secondary | ICD-10-CM | POA: Diagnosis not present

## 2017-07-06 DIAGNOSIS — R7303 Prediabetes: Secondary | ICD-10-CM | POA: Diagnosis not present

## 2017-07-06 DIAGNOSIS — G4730 Sleep apnea, unspecified: Secondary | ICD-10-CM | POA: Diagnosis not present

## 2017-07-06 DIAGNOSIS — Z87891 Personal history of nicotine dependence: Secondary | ICD-10-CM | POA: Diagnosis not present

## 2017-07-06 HISTORY — DX: Chronic obstructive pulmonary disease, unspecified: J44.9

## 2017-07-06 HISTORY — DX: Pneumonia, unspecified organism: J18.9

## 2017-07-06 HISTORY — DX: Inflammatory liver disease, unspecified: K75.9

## 2017-07-06 LAB — BASIC METABOLIC PANEL
Anion gap: 11 (ref 5–15)
BUN: 19 mg/dL (ref 6–20)
CO2: 27 mmol/L (ref 22–32)
Calcium: 9.3 mg/dL (ref 8.9–10.3)
Chloride: 103 mmol/L (ref 101–111)
Creatinine, Ser: 1.24 mg/dL (ref 0.61–1.24)
GFR calc Af Amer: >60 mL/min (ref >60)
GFR calc non Af Amer: 58 mL/min — ABNORMAL LOW (ref >60)
Glucose, Bld: 142 mg/dL — ABNORMAL HIGH (ref 65–99)
Potassium: 3.3 mmol/L — ABNORMAL LOW (ref 3.5–5.1)
Sodium: 141 mmol/L (ref 135–145)

## 2017-07-06 NOTE — Progress Notes (Signed)
BMP routed to Dr. Tonita Cong via epic 07/06/17

## 2017-07-07 MED ORDER — CEFAZOLIN SODIUM 10 G IJ SOLR
3.0000 g | INTRAMUSCULAR | Status: AC
  Administered 2017-07-08: 3 g via INTRAVENOUS
  Filled 2017-07-07: qty 3

## 2017-07-08 ENCOUNTER — Ambulatory Visit (HOSPITAL_COMMUNITY): Payer: Medicare Other | Admitting: Registered Nurse

## 2017-07-08 ENCOUNTER — Ambulatory Visit (HOSPITAL_COMMUNITY)
Admission: RE | Admit: 2017-07-08 | Discharge: 2017-07-08 | Disposition: A | Discharge status: Home / Self Care | Payer: Medicare Other | Source: Ambulatory Visit | Attending: Specialist | Admitting: Specialist

## 2017-07-08 ENCOUNTER — Encounter (HOSPITAL_COMMUNITY)
Admission: RE | Disposition: A | Discharge status: Home / Self Care | Payer: Self-pay | Source: Ambulatory Visit | Attending: Specialist

## 2017-07-08 ENCOUNTER — Encounter (HOSPITAL_COMMUNITY): Payer: Self-pay

## 2017-07-08 DIAGNOSIS — X58XXXA Exposure to other specified factors, initial encounter: Secondary | ICD-10-CM | POA: Insufficient documentation

## 2017-07-08 DIAGNOSIS — M75120 Complete rotator cuff tear or rupture of unspecified shoulder, not specified as traumatic: Secondary | ICD-10-CM | POA: Diagnosis present

## 2017-07-08 DIAGNOSIS — I739 Peripheral vascular disease, unspecified: Secondary | ICD-10-CM | POA: Insufficient documentation

## 2017-07-08 DIAGNOSIS — Z87891 Personal history of nicotine dependence: Secondary | ICD-10-CM | POA: Insufficient documentation

## 2017-07-08 DIAGNOSIS — Z85820 Personal history of malignant melanoma of skin: Secondary | ICD-10-CM | POA: Insufficient documentation

## 2017-07-08 DIAGNOSIS — Z9989 Dependence on other enabling machines and devices: Secondary | ICD-10-CM | POA: Insufficient documentation

## 2017-07-08 DIAGNOSIS — I714 Abdominal aortic aneurysm, without rupture: Secondary | ICD-10-CM | POA: Insufficient documentation

## 2017-07-08 DIAGNOSIS — Z6841 Body Mass Index (BMI) 40.0 and over, adult: Secondary | ICD-10-CM | POA: Insufficient documentation

## 2017-07-08 DIAGNOSIS — I10 Essential (primary) hypertension: Secondary | ICD-10-CM | POA: Insufficient documentation

## 2017-07-08 DIAGNOSIS — J449 Chronic obstructive pulmonary disease, unspecified: Secondary | ICD-10-CM | POA: Insufficient documentation

## 2017-07-08 DIAGNOSIS — Z7982 Long term (current) use of aspirin: Secondary | ICD-10-CM | POA: Insufficient documentation

## 2017-07-08 DIAGNOSIS — M7541 Impingement syndrome of right shoulder: Secondary | ICD-10-CM | POA: Diagnosis not present

## 2017-07-08 DIAGNOSIS — K219 Gastro-esophageal reflux disease without esophagitis: Secondary | ICD-10-CM | POA: Insufficient documentation

## 2017-07-08 DIAGNOSIS — M1990 Unspecified osteoarthritis, unspecified site: Secondary | ICD-10-CM | POA: Insufficient documentation

## 2017-07-08 DIAGNOSIS — M75111 Incomplete rotator cuff tear or rupture of right shoulder, not specified as traumatic: Secondary | ICD-10-CM | POA: Diagnosis not present

## 2017-07-08 DIAGNOSIS — I4510 Unspecified right bundle-branch block: Secondary | ICD-10-CM | POA: Insufficient documentation

## 2017-07-08 DIAGNOSIS — Z79899 Other long term (current) drug therapy: Secondary | ICD-10-CM | POA: Insufficient documentation

## 2017-07-08 DIAGNOSIS — E785 Hyperlipidemia, unspecified: Secondary | ICD-10-CM | POA: Insufficient documentation

## 2017-07-08 DIAGNOSIS — E291 Testicular hypofunction: Secondary | ICD-10-CM | POA: Insufficient documentation

## 2017-07-08 DIAGNOSIS — G4730 Sleep apnea, unspecified: Secondary | ICD-10-CM | POA: Insufficient documentation

## 2017-07-08 DIAGNOSIS — R7303 Prediabetes: Secondary | ICD-10-CM | POA: Insufficient documentation

## 2017-07-08 HISTORY — PX: SHOULDER OPEN ROTATOR CUFF REPAIR: SHX2407

## 2017-07-08 SURGERY — REPAIR, ROTATOR CUFF, OPEN
Anesthesia: General | Site: Shoulder | Laterality: Right

## 2017-07-08 MED ORDER — LACTATED RINGERS IV SOLN
INTRAVENOUS | Status: DC
  Administered 2017-07-08: 07:00:00 via INTRAVENOUS

## 2017-07-08 MED ORDER — POLYETHYLENE GLYCOL 3350 17 G PO PACK: 17.0000 g | PACK | Freq: Every day | ORAL | 0 refills | Status: DC

## 2017-07-08 MED ORDER — EPHEDRINE 5 MG/ML INJ
INTRAVENOUS | Status: AC
  Filled 2017-07-08: qty 10

## 2017-07-08 MED ORDER — BUPIVACAINE-EPINEPHRINE (PF) 0.5% -1:200000 IJ SOLN
INTRAMUSCULAR | Status: DC | PRN
  Administered 2017-07-08: 30 mL

## 2017-07-08 MED ORDER — SODIUM CHLORIDE 0.9 % IV SOLN
INTRAVENOUS | Status: DC | PRN
  Administered 2017-07-08: 500 mL

## 2017-07-08 MED ORDER — MIDAZOLAM HCL 2 MG/2ML IJ SOLN
INTRAMUSCULAR | Status: AC
  Filled 2017-07-08: qty 2

## 2017-07-08 MED ORDER — PROPOFOL 10 MG/ML IV BOLUS
INTRAVENOUS | Status: DC | PRN
  Administered 2017-07-08: 250 mg via INTRAVENOUS

## 2017-07-08 MED ORDER — MIDAZOLAM HCL 5 MG/5ML IJ SOLN
INTRAMUSCULAR | Status: DC | PRN
  Administered 2017-07-08 (×2): 1 mg via INTRAVENOUS

## 2017-07-08 MED ORDER — ONDANSETRON HCL 4 MG/2ML IJ SOLN
INTRAMUSCULAR | Status: AC
  Filled 2017-07-08: qty 2

## 2017-07-08 MED ORDER — SUCCINYLCHOLINE CHLORIDE 200 MG/10ML IV SOSY
PREFILLED_SYRINGE | INTRAVENOUS | Status: DC | PRN
  Administered 2017-07-08: 160 mg via INTRAVENOUS

## 2017-07-08 MED ORDER — METHOCARBAMOL 500 MG PO TABS
500.0000 mg | ORAL_TABLET | Freq: Four times a day (QID) | ORAL | Status: DC | PRN
Start: 2017-07-08 — End: 2017-07-08

## 2017-07-08 MED ORDER — BUPIVACAINE-EPINEPHRINE (PF) 0.5% -1:200000 IJ SOLN
INTRAMUSCULAR | Status: AC
  Filled 2017-07-08: qty 30

## 2017-07-08 MED ORDER — FENTANYL CITRATE (PF) 250 MCG/5ML IJ SOLN
INTRAMUSCULAR | Status: DC | PRN
  Administered 2017-07-08: 150 ug via INTRAVENOUS
  Administered 2017-07-08 (×2): 50 ug via INTRAVENOUS

## 2017-07-08 MED ORDER — DEXAMETHASONE SODIUM PHOSPHATE 10 MG/ML IJ SOLN
INTRAMUSCULAR | Status: DC | PRN
  Administered 2017-07-08: 10 mg via INTRAVENOUS

## 2017-07-08 MED ORDER — DEXAMETHASONE SODIUM PHOSPHATE 10 MG/ML IJ SOLN
INTRAMUSCULAR | Status: AC
  Filled 2017-07-08: qty 1

## 2017-07-08 MED ORDER — CHLORHEXIDINE GLUCONATE 4 % EX LIQD: 60.0000 mL | Freq: Once | CUTANEOUS | Status: DC

## 2017-07-08 MED ORDER — SUCCINYLCHOLINE CHLORIDE 200 MG/10ML IV SOSY
PREFILLED_SYRINGE | INTRAVENOUS | Status: AC
  Filled 2017-07-08: qty 10

## 2017-07-08 MED ORDER — LIDOCAINE 2% (20 MG/ML) 5 ML SYRINGE
INTRAMUSCULAR | Status: AC
  Filled 2017-07-08: qty 5

## 2017-07-08 MED ORDER — PROMETHAZINE HCL 25 MG/ML IJ SOLN: 6.2500 mg | INTRAMUSCULAR | Status: DC | PRN

## 2017-07-08 MED ORDER — FENTANYL CITRATE (PF) 250 MCG/5ML IJ SOLN
INTRAMUSCULAR | Status: AC
  Filled 2017-07-08: qty 5

## 2017-07-08 MED ORDER — ONDANSETRON HCL 4 MG/2ML IJ SOLN
INTRAMUSCULAR | Status: DC | PRN
  Administered 2017-07-08: 4 mg via INTRAVENOUS

## 2017-07-08 MED ORDER — LIDOCAINE 2% (20 MG/ML) 5 ML SYRINGE
INTRAMUSCULAR | Status: DC | PRN
  Administered 2017-07-08: 60 mg via INTRAVENOUS

## 2017-07-08 MED ORDER — HYDROMORPHONE HCL 1 MG/ML IJ SOLN: 0.2500 mg | INTRAMUSCULAR | Status: DC | PRN

## 2017-07-08 MED ORDER — BUPIVACAINE-EPINEPHRINE 0.5% -1:200000 IJ SOLN
INTRAMUSCULAR | Status: DC | PRN
  Administered 2017-07-08: 6 mL

## 2017-07-08 MED ORDER — OXYCODONE-ACETAMINOPHEN 10-325 MG PO TABS: 1.0000 | ORAL_TABLET | ORAL | 0 refills | Status: DC | PRN

## 2017-07-08 MED ORDER — PROPOFOL 10 MG/ML IV BOLUS
INTRAVENOUS | Status: AC
  Filled 2017-07-08: qty 40

## 2017-07-08 MED ORDER — MEPERIDINE HCL 50 MG/ML IJ SOLN: 6.2500 mg | INTRAMUSCULAR | Status: DC | PRN

## 2017-07-08 MED ORDER — EPHEDRINE SULFATE-NACL 50-0.9 MG/10ML-% IV SOSY
PREFILLED_SYRINGE | INTRAVENOUS | Status: DC | PRN
  Administered 2017-07-08 (×2): 15 mg via INTRAVENOUS

## 2017-07-08 MED ORDER — DOCUSATE SODIUM 100 MG PO CAPS: 100.0000 mg | ORAL_CAPSULE | Freq: Two times a day (BID) | ORAL | 1 refills | Status: DC | PRN

## 2017-07-08 MED ORDER — METHOCARBAMOL 1000 MG/10ML IJ SOLN
500.0000 mg | Freq: Four times a day (QID) | INTRAVENOUS | Status: DC | PRN
  Filled 2017-07-08: qty 5

## 2017-07-08 MED ORDER — SODIUM CHLORIDE 0.9 % IV SOLN
INTRAVENOUS | Status: AC
  Filled 2017-07-08: qty 500000

## 2017-07-08 MED ORDER — ROCURONIUM BROMIDE 50 MG/5ML IV SOSY
PREFILLED_SYRINGE | INTRAVENOUS | Status: AC
  Filled 2017-07-08: qty 10

## 2017-07-08 MED ORDER — DEXTROSE 5 % IV SOLN
3.0000 g | Freq: Four times a day (QID) | INTRAVENOUS | Status: DC
  Administered 2017-07-08: 3 g via INTRAVENOUS
  Filled 2017-07-08: qty 3
  Filled 2017-07-08: qty 3000

## 2017-07-08 SURGICAL SUPPLY — 49 items
ANCHOR NDL 9/16 CIR SZ 8 (NEEDLE) IMPLANT
ANCHOR NEEDLE 9/16 CIR SZ 8 (NEEDLE) ×3 IMPLANT
BAG SPEC THK2 15X12 ZIP CLS (MISCELLANEOUS)
BAG ZIPLOCK 12X15 (MISCELLANEOUS) IMPLANT
BLADE OSCILLATING/SAGITTAL (BLADE)
BLADE SW THK.38XMED LNG THN (BLADE) IMPLANT
CLOSURE WOUND 1/2 X4 (GAUZE/BANDAGES/DRESSINGS) ×1
CLOTH 2% CHLOROHEXIDINE 3PK (PERSONAL CARE ITEMS) ×3 IMPLANT
COVER SURGICAL LIGHT HANDLE (MISCELLANEOUS) ×3 IMPLANT
DRAPE POUCH INSTRU U-SHP 10X18 (DRAPES) ×3 IMPLANT
DRSG AQUACEL AG ADV 3.5X 4 (GAUZE/BANDAGES/DRESSINGS) ×2 IMPLANT
DURAPREP 26ML APPLICATOR (WOUND CARE) ×3 IMPLANT
ELECT NDL TIP 2.8 STRL (NEEDLE) ×1 IMPLANT
ELECT NEEDLE TIP 2.8 STRL (NEEDLE) ×3 IMPLANT
ELECT REM PT RETURN 15FT ADLT (MISCELLANEOUS) ×3 IMPLANT
GLOVE BIO SURGEON STRL SZ7 (GLOVE) ×2 IMPLANT
GLOVE BIOGEL PI IND STRL 7.0 (GLOVE) ×1 IMPLANT
GLOVE BIOGEL PI IND STRL 7.5 (GLOVE) IMPLANT
GLOVE BIOGEL PI IND STRL 8 (GLOVE) ×1 IMPLANT
GLOVE BIOGEL PI INDICATOR 7.0 (GLOVE) ×4
GLOVE BIOGEL PI INDICATOR 7.5 (GLOVE) ×2
GLOVE BIOGEL PI INDICATOR 8 (GLOVE) ×2
GLOVE SURG SS PI 7.0 STRL IVOR (GLOVE) ×3 IMPLANT
GLOVE SURG SS PI 7.5 STRL IVOR (GLOVE) ×3 IMPLANT
GLOVE SURG SS PI 8.0 STRL IVOR (GLOVE) ×3 IMPLANT
GOWN STRL REUS W/TWL XL LVL3 (GOWN DISPOSABLE) ×6 IMPLANT
KIT BASIN OR (CUSTOM PROCEDURE TRAY) ×3 IMPLANT
KIT POSITION SHOULDER SCHLEI (MISCELLANEOUS) ×3 IMPLANT
MANIFOLD NEPTUNE II (INSTRUMENTS) ×3 IMPLANT
MARKER SKIN DUAL TIP RULER LAB (MISCELLANEOUS) ×3 IMPLANT
NDL SCORPION MULTI FIRE (NEEDLE) IMPLANT
NEEDLE SCORPION MULTI FIRE (NEEDLE) IMPLANT
PACK SHOULDER (CUSTOM PROCEDURE TRAY) ×3 IMPLANT
POSITIONER SURGICAL ARM (MISCELLANEOUS) ×3 IMPLANT
SLING ARM FOAM STRAP XLG (SOFTGOODS) ×2 IMPLANT
SLING ULTRA II L (ORTHOPEDIC SUPPLIES) IMPLANT
STRIP CLOSURE SKIN 1/2X4 (GAUZE/BANDAGES/DRESSINGS) ×2 IMPLANT
SUT BONE WAX W31G (SUTURE) IMPLANT
SUT ETHIBOND NAB CT1 #1 30IN (SUTURE) IMPLANT
SUT FIBERWIRE #2 38 T-5 BLUE (SUTURE)
SUT PROLENE 3 0 PS 2 (SUTURE) ×3 IMPLANT
SUT TIGER TAPE 7 IN WHITE (SUTURE) IMPLANT
SUT VIC AB 1-0 CT2 27 (SUTURE) ×5 IMPLANT
SUT VIC AB 2-0 CT2 27 (SUTURE) ×3 IMPLANT
SUTURE FIBERWR #2 38 T-5 BLUE (SUTURE) IMPLANT
TAPE FIBER 2MM 7IN #2 BLUE (SUTURE) IMPLANT
TOWEL OR 17X26 10 PK STRL BLUE (TOWEL DISPOSABLE) ×3 IMPLANT
TOWEL OR NON WOVEN STRL DISP B (DISPOSABLE) ×3 IMPLANT
YANKAUER SUCT BULB TIP NO VENT (SUCTIONS) ×3 IMPLANT

## 2017-07-08 NOTE — Interval H&P Note (Signed)
History and Physical Interval Note:  07/08/2017 7:21 AM  William Hebert  has presented today for surgery, with the diagnosis of Right shoulder rotator cuff tear  The various methods of treatment have been discussed with the patient and family. After consideration of risks, benefits and other options for treatment, the patient has consented to  Procedure(s) with comments: Right shoulder mini open rotator cuff repair, possible patch graft (Right) - 90 mins as a surgical intervention .  The patient's history has been reviewed, patient examined, no change in status, stable for surgery.  I have reviewed the patient's chart and labs.  Questions were answered to the patient's satisfaction.     [name]

## 2017-07-08 NOTE — Anesthesia Procedure Notes (Signed)
Procedure Name: Intubation Date/Time: 07/08/2017 7:43 AM Performed by: Talbot Grumbling, CRNA Pre-anesthesia Checklist: Patient identified, Emergency Drugs available, Suction available and Patient being monitored Patient Re-evaluated:Patient Re-evaluated prior to induction Oxygen Delivery Method: Circle system utilized Preoxygenation: Pre-oxygenation with 100% oxygen Induction Type: IV induction Ventilation: Mask ventilation without difficulty Laryngoscope Size: Mac and 4 Grade View: Grade II Tube type: Oral Number of attempts: 1 Airway Equipment and Method: Stylet Placement Confirmation: ETT inserted through vocal cords under direct vision,  positive ETCO2 and breath sounds checked- equal and bilateral Secured at: 23 cm Tube secured with: Tape Dental Injury: Teeth and Oropharynx as per pre-operative assessment

## 2017-07-08 NOTE — Brief Op Note (Signed)
07/08/2017  8:31 AM  PATIENT:  William Hebert  71 y.o. male  PRE-OPERATIVE DIAGNOSIS:  Right shoulder rotator cuff tear  POST-OPERATIVE DIAGNOSIS:  Right shoulder rotator cuff tear  PROCEDURE:  Procedure(s) with comments: Right shoulder mini open rotator cuff repair (Right) - 90 mins; Interscalene Block  SURGEON:  Surgeon(s) and Role:    * Susa Day, MD - Primary  PHYSICIAN ASSISTANT:   ASSISTANTS: [place]   ANESTHESIA:   GET  EBL:  min   BLOOD ADMINISTERED:none  DRAINS: none   LOCAL MEDICATIONS USED:  MARCAINE     SPECIMEN:  No Specimen  DISPOSITION OF SPECIMEN:  N/A  COUNTS:  YES  TOURNIQUET:  * No tourniquets in log *  DICTATION: .Other Dictation: Dictation Number [id number]  PLAN OF CARE: Admit for overnight observation  PATIENT DISPOSITION:  PACU - hemodynamically stable.   Delay start of Pharmacological VTE agent (>24hrs) due to surgical blood loss or risk of bleeding: no

## 2017-07-08 NOTE — Anesthesia Preprocedure Evaluation (Signed)
Anesthesia Evaluation  Patient identified by MRN, date of birth, ID band Patient awake    Reviewed: Allergy & Precautions, NPO status , Patient's Chart, lab work & pertinent test results, reviewed documented beta blocker date and time   History of Anesthesia Complications (+) PONV and history of anesthetic complications  Airway Mallampati: II  TM Distance: >3 FB Neck ROM: Full    Dental no notable dental hx. (+) Teeth Intact, Caps   Pulmonary shortness of breath and with exertion, sleep apnea and Continuous Positive Airway Pressure Ventilation , pneumonia, resolved, COPD, former smoker,    Pulmonary exam normal breath sounds clear to auscultation       Cardiovascular hypertension, Pt. on medications and Pt. on home beta blockers + Peripheral Vascular Disease  Normal cardiovascular exam Rhythm:Regular Rate:Normal  Hx/o AAA S/P endovascular stent 2012 Incomplete RBBB   Neuro/Psych  Headaches, negative psych ROS   GI/Hepatic GERD  Medicated and Controlled,(+) Hepatitis -  Endo/Other  Morbid obesityHyperlipidemia Hypogonadism Pre diabetic HbA1c 6.5  Renal/GU negative Renal ROS   ED    Musculoskeletal  (+) Arthritis , Osteoarthritis,  Torn rotator cuff right shoulder   Abdominal (+) + obese,   Peds  Hematology negative hematology ROS (+)   Anesthesia Other Findings   Reproductive/Obstetrics                             Anesthesia Physical Anesthesia Plan  ASA: III  Anesthesia Plan: General   Post-op Pain Management:  Regional for Post-op pain   Induction: Intravenous  PONV Risk Score and Plan: 4 or greater and Scopolamine patch - Pre-op, Propofol infusion, Ondansetron, Dexamethasone, Promethazine and Treatment may vary due to age or medical condition  Airway Management Planned: Oral ETT  Additional Equipment:   Intra-op Plan:   Post-operative Plan: Extubation in OR  Informed  Consent: I have reviewed the patients History and Physical, chart, labs and discussed the procedure including the risks, benefits and alternatives for the proposed anesthesia with the patient or authorized representative who has indicated his/her understanding and acceptance.   Dental advisory given  Plan Discussed with: CRNA, Anesthesiologist and Surgeon  Anesthesia Plan Comments:         Anesthesia Quick Evaluation

## 2017-07-08 NOTE — Anesthesia Procedure Notes (Signed)
Anesthesia Regional Block: Supraclavicular block   Pre-Anesthetic Checklist: ,, timeout performed, Correct Patient, Correct Site, Correct Laterality, Correct Procedure, Correct Position, site marked, Risks and benefits discussed,  Surgical consent,  Pre-op evaluation,  At surgeon's request and post-op pain management  Laterality: Right  Prep: chloraprep       Needles:  Injection technique: Single-shot  Needle Type: Echogenic Stimulator Needle     Needle Length: 9cm  Needle Gauge: 21   Needle insertion depth: 8 cm   Additional Needles:   Procedures:,,,, ultrasound used (permanent image in chart),,,,  Narrative:  Start time: 07/08/2017 7:12 AM End time: 07/08/2017 7:17 AM Injection made incrementally with aspirations every 5 mL.  Performed by: Personally  Anesthesiologist: [name], MD  Additional Notes: Timeout performed. Patient sedated. Relevant anatomy ID'd using [place]. Incremental 2-67ml injection of LA with frequent aspiration. Patient tolerated procedure well.

## 2017-07-08 NOTE — Discharge Instructions (Signed)
Aquacel dressing may remain in place until follow up. May shower with aquacel dressing in place. If the dressing becomes saturated or peels off, you may remove it and place a new dressing with gauze and tape which should be kept clean and dry and changed daily. Use sling at all times except when exercising or showering No driving for 4-6 weeks No lifting for 6 weeks operative arm Pendulum exercises as instructed. Ok to move wrist, elbow, and hand. See Dr. Tonita Cong in 10-14 days. Take one aspirin per day with a meal if not on a blood thinner or allergic to aspirin.  General Anesthesia, Adult, Care After These instructions provide you with information about caring for yourself after your procedure. Your health care provider may also give you more specific instructions. Your treatment has been planned according to current medical practices, but problems sometimes occur. Call your health care provider if you have any problems or questions after your procedure. What can I expect after the procedure? After the procedure, it is common to have:  Vomiting.  A sore throat.  Mental slowness.  It is common to feel:  Nauseous.  Cold or shivery.  Sleepy.  Tired.  Sore or achy, even in parts of your body where you did not have surgery.  Follow these instructions at home: For at least 24 hours after the procedure:  Do not: ? Participate in activities where you could fall or become injured. ? Drive. ? Use heavy machinery. ? Drink alcohol. ? Take sleeping pills or medicines that cause drowsiness. ? Make important decisions or sign legal documents. ? Take care of children on your own.  Rest. Eating and drinking  If you vomit, drink water, juice, or soup when you can drink without vomiting.  Drink enough fluid to keep your urine clear or pale yellow.  Make sure you have little or no nausea before eating solid foods.  Follow the diet recommended by your health care provider. General  instructions  Have a responsible adult stay with you until you are awake and alert.  Return to your normal activities as told by your health care provider. Ask your health care provider what activities are safe for you.  Take over-the-counter and prescription medicines only as told by your health care provider.  If you smoke, do not smoke without supervision.  Keep all follow-up visits as told by your health care provider. This is important. Contact a health care provider if:  You continue to have nausea or vomiting at home, and medicines are not helpful.  You cannot drink fluids or start eating again.  You cannot urinate after 8-12 hours.  You develop a skin rash.  You have fever.  You have increasing redness at the site of your procedure. Get help right away if:  You have difficulty breathing.  You have chest pain.  You have unexpected bleeding.  You feel that you are having a life-threatening or urgent problem. This information is not intended to replace advice given to you by your health care provider. Make sure you discuss any questions you have with your health care provider. Document Released: 11/23/2000 Document Revised: 01/20/2016 Document Reviewed: 08/01/2015 Elsevier Interactive Patient Education  [employer].

## 2017-07-08 NOTE — Progress Notes (Signed)
Patients wife arrive to unit stating "surgical nurse" called her stating patient would be admitted tonight.  RN spoke with Dr. Tonita Cong who confirmed patient may be discharged today after OT therapy visit.

## 2017-07-08 NOTE — Evaluation (Signed)
Occupational Therapy Evaluation Patient Details Name: William Hebert MRN: [id number] DOB: [birth year] Today's Date: 07/08/2017    History of Present Illness s/p R mini open RCR   Clinical Impression   Pt was admitted for the above sx. All education was completed.  Pt will follow up with Dr Tonita Cong    Follow Up Recommendations  DC plan and follow up therapy as arranged by surgeon    Equipment Recommendations  None recommended by OT    Recommendations for Other Services       Precautions / Restrictions Precautions Precautions: Shoulder Type of Shoulder Precautions: sling at all times x bathing/dressing and exercise, pendulums OK, no abduction, PROM FF to 90; PROM ER to 30 Shoulder Interventions: Shoulder sling/immobilizer Precaution Booklet Issued: Yes (comment) Precaution Comments: taught only FF pendulums due to no ABD Restrictions Weight Bearing Restrictions: Yes Other Position/Activity Restrictions: NWB      Mobility Bed Mobility               General bed mobility comments: pt was at EOB; plans to stay in recliner  Transfers Overall transfer level: Needs assistance               General transfer comment: supervision, sit to stand    Balance                                           ADL either performed or assessed with clinical judgement   ADL Overall ADL's : Needs assistance/impaired Eating/Feeding: Set up   Grooming: Set up;Sitting   Upper Body Bathing: Maximal assistance;Sitting   Lower Body Bathing: Maximal assistance;Sit to/from stand   Upper Body Dressing : Maximal assistance;Sitting   Lower Body Dressing: Maximal assistance;Sit to/from stand                 General ADL Comments: worked through [employer].  Family assisted with LB.  Educated on allowed exercises:  pendulums in FF; PROM FF to 90; PROM ER to 30 and elbow flexion/extension. Pt did not feel  up to doing exercises today. He has back pain so may not be able  to do pendulums. Pt and wife verbalize understanding of all education and protocol and exercise sheets given.See education section of chart     Vision         Perception     Praxis      Pertinent Vitals/Pain Pain Assessment: Faces Faces Pain Scale: Hurts little more Pain Location: R shoulder Pain Intervention(s): Limited activity within patient's tolerance;Monitored during session     Hand Dominance     Extremity/Trunk Assessment Upper Extremity Assessment Upper Extremity Assessment: RUE deficits/detail(immobilized; able to move fingers and wrist)           Communication Communication Communication: No difficulties   Cognition Arousal/Alertness: Awake/alert Behavior During Therapy: WFL for tasks assessed/performed Overall Cognitive Status: Within Functional Limits for tasks assessed                                     General Comments  pt is in a standard sling. Tied on a piece of velfoam to extend thumb loop for improved comfort    Exercises     Shoulder Instructions      Home Living Family/patient expects to be discharged to:: Private residence Living  Arrangements: Spouse/significant other                               Additional Comments: pt has a high commode and tub with seat and hand held shower      Prior Functioning/Environment Level of Independence: Independent                 OT Problem List:        OT Treatment/Interventions:      OT Goals(Current goals can be found in the care plan section) Acute Rehab OT Goals Patient Stated Goal: return to independence OT Goal Formulation: All assessment and education complete, DC therapy  OT Frequency:     Barriers to D/C:            Co-evaluation              AM-PAC PT "6 Clicks" Daily Activity     Outcome Measure Help from another person eating meals?: A Little Help from another person taking care of personal grooming?: A Little Help from another  person toileting, which includes using toliet, bedpan, or urinal?: A Little Help from another person bathing (including washing, rinsing, drying)?: A Lot Help from another person to put on and taking off regular upper body clothing?: A Lot Help from another person to put on and taking off regular lower body clothing?: A Lot 6 Click Score: 15   End of Session    Activity Tolerance: Patient tolerated treatment well Patient left: (EOB with family and RN)  OT Visit Diagnosis: Pain Pain - Right/Left: Right Pain - part of body: Shoulder                Time: 1062-6948 OT Time Calculation (min): 30 min Charges:  OT General Charges $OT Visit: 1 Visit OT Evaluation $OT Eval Low Complexity: 1 Low OT Treatments $Self Care/Home Management : 8-22 mins G-Codes: OT G-codes **NOT FOR INPATIENT CLASS** Functional Assessment Tool Used: Clinical judgement Functional Limitation: Self care Self Care Current Status (N4627): At least 60 percent but less than 80 percent impaired, limited or restricted Self Care Goal Status (O3500): At least 60 percent but less than 80 percent impaired, limited or restricted Self Care Discharge Status [phone]): At least 60 percent but less than 80 percent impaired, limited or restricted   Lesle Chris, OTR/L [phone] 07/08/2017  William Hebert 07/08/2017, 3:31 PM

## 2017-07-08 NOTE — Progress Notes (Signed)
PACU note----pt expresses strong desire to be d/c'ed home; Dr. Tonita Cong and Dr. Royce Macadamia, anesthesiologist, in to evaluate pt; Dr. Tonita Cong ordered pt receive dose of antibiotic at 12 noon, receive therapy to shoulder, and if meets discharge criteria, pt may be discharged home after this is completed.  Dr. Royce Macadamia agreed with these orders

## 2017-07-08 NOTE — Transfer of Care (Signed)
Immediate Anesthesia Transfer of Care Note  Patient: William Hebert  Procedure(s) Performed: Right shoulder mini open rotator cuff repair (Right Shoulder)  Patient Location: PACU  Anesthesia Type:General  Level of Consciousness: sedated  Airway & Oxygen Therapy: Patient Spontanous Breathing and Patient connected to face mask oxygen  Post-op Assessment: Report given to RN and Post -op Vital signs reviewed and stable  Post vital signs: Reviewed and stable  Last Vitals:  Vitals:   07/08/17 0539  BP: (!) 168/92  Pulse: 65  Resp: 18  Temp: 36.6 C  SpO2: 95%    Last Pain:  Vitals:   07/08/17 0539  TempSrc: Oral         Complications: No apparent anesthesia complications

## 2017-07-08 NOTE — Anesthesia Postprocedure Evaluation (Signed)
Anesthesia Post Note  Patient: William Hebert  Procedure(s) Performed: Right shoulder mini open rotator cuff repair (Right Shoulder)     Patient location during evaluation: PACU Anesthesia Type: General Level of consciousness: awake and alert and oriented Pain management: pain level controlled Vital Signs Assessment: post-procedure vital signs reviewed and stable Respiratory status: spontaneous breathing, nonlabored ventilation and respiratory function stable Cardiovascular status: blood pressure returned to baseline and stable Postop Assessment: no apparent nausea or vomiting Anesthetic complications: no Comments: Supraclavicular block present. No complaints of pain.    Last Vitals:  Vitals:   07/08/17 0539 07/08/17 0847  BP: (!) 168/92 (!) 159/83  Pulse: 65 70  Resp: 18 17  Temp: 36.6 C 36.8 C  SpO2: 95% 97%    Last Pain:  Vitals:   07/08/17 0915  TempSrc:   PainSc: 0-No pain                 Beth Spackman A.

## 2017-07-09 NOTE — Addendum Note (Signed)
Addendum  created 07/09/17 0617 by Lollie Sails, CRNA   Charge Capture section accepted, Visit diagnoses modified

## 2017-07-09 NOTE — Op Note (Signed)
NAME:  William Hebert, William Hebert NO.:  MEDICAL RECORD NO.:  [id number]  LOCATION:                                 FACILITY:  PHYSICIAN:  Susa Day, M.D.         DATE OF BIRTH:  DATE OF PROCEDURE:  07/08/2017 DATE OF DISCHARGE:                              OPERATIVE REPORT   PREOPERATIVE DIAGNOSIS:  Rotator cuff tear, impingement syndrome of the right shoulder.  POSTOPERATIVE DIAGNOSIS:  Rotator cuff tear, impingement syndrome of the right shoulder.  PROCEDURES PERFORMED: 1. Mini open rotator cuff repair, right shoulder. 2. Acromioplasty. 3. Bursectomy, coracoacromial ligament resection.  ANESTHESIA:  General, also regional block.  ASSISTANT:  Cleophas Dunker, PA.  HISTORY:  A 71, tear greater than 50% of the supraspinatus.  Severe pain refractory to conservative treatment, type 2 acromion, indicated for mini open rotator cuff repair and subacromial decompression.  Risks and benefits were discussed including bleeding, infection, damage to the neurovascular structures, suboptimal range of motion, need for revision, DVT, PE, anesthetic complications, etc.  TECHNIQUE:  With the patient in supine beach-chair position after the induction of adequate anesthesia, 3 g of Kefzol, the right upper extremity and shoulder were prepped and draped in usual sterile fashion. Surgical marker was utilized to delineate the acromion, AC joint, coracoid.  A 2-cm incision over the anterolateral aspect of the acromion was made through the skin after infiltration with 0.25% Marcaine with epinephrine.  Subcutaneous tissue was dissected.  Rhaphe between the anterolateral heads of the deltoid was identified and divided in line with the skin incision.  Again, 2 cm over the anterolateral aspect of the acromion, self-retaining Chung retractor was utilized.  CA ligament was further identified and excised.  A spur off the anterolateral aspect of the acromion was removed with a 3-mm  [place].  We used an AO elevator to detach the remainder of the CA ligament, preserving the deltoid attachment.  I digitally lysed the subacromial adhesions. Exuberant subacromial bursa was noted and this was excised.  A tear on the supraspinatus was noted, it was hyperemic.  Longitudinal, no retraction of the tear noted.  I incised it longitudinally, was about a centimeter in length.  Debrided the tendon, good bleeding tissue and repaired it side-to-side with #1 Vicryl interrupted figure-of-eight sutures, this was in the distal supraspinatus.  The remainder of the cuff was unremarkable.  Copiously irrigated the wound, closed the raphae with 1 Vicryl, subcu with 2 and skin with Prolene.  Sterile dressing applied.  Placed in a sling, extubated without difficulty and transported to the recovery room in satisfactory condition.  The patient tolerated the procedure well.  No complications.  Minimal blood loss.     Susa Day, M.D.     Geralynn Rile  D:  07/08/2017  T:  07/09/2017  Job:  [id number]

## 2017-07-15 ENCOUNTER — Other Ambulatory Visit: Payer: Self-pay

## 2017-07-15 MED ORDER — LABETALOL HCL 200 MG PO TABS: 200.0000 mg | ORAL_TABLET | Freq: Every day | ORAL | 0 refills | Status: DC

## 2017-07-16 ENCOUNTER — Ambulatory Visit: Payer: Self-pay

## 2017-07-16 ENCOUNTER — Telehealth: Payer: Self-pay | Admitting: Family Medicine

## 2017-07-16 NOTE — Telephone Encounter (Signed)
I spoke with pts wife and she is going to take pt to UC as suggested by [facility] triage nurse. No CP or SOB. Pt has been sitting with legs down; advised to elevate legs when sitting. Also so separate triage nurse note for 07/16/17. FYI to Dr Darnell Level.

## 2017-07-16 NOTE — Telephone Encounter (Signed)
  Reason for Disposition . [1] MODERATE leg swelling (e.g., swelling extends up to knees) AND [2] new onset or worsening  Answer Assessment - Initial Assessment Questions 1. ONSET: "When did the swelling start?" (e.g., minutes, hours, days)     Tuesday night 2. LOCATION: "What part of the leg is swollen?"  "Are both legs swollen or just one leg?"     Both legs and feet from the knees down 3. SEVERITY: "How bad is the swelling?" (e.g., localized; mild, moderate, severe)  - Localized - small area of swelling localized to one leg  - MILD pedal edema - swelling limited to foot and ankle, pitting edema < 1/4 inch (6 mm) deep, rest and elevation eliminate most or all swelling  - MODERATE edema - swelling of lower leg to knee, pitting edema > 1/4 inch (6 mm) deep, rest and elevation only partially reduce swelling  - SEVERE edema - swelling extends above knee, facial or hand swelling present      moderate 4. REDNESS: "Does the swelling look red or infected?"     no 5. PAIN: "Is the swelling painful to touch?" If so, ask: "How painful is it?"   (Scale 1-10; mild, moderate or severe)     no 6. FEVER: "Do you have a fever?" If so, ask: "What is it, how was it measured, and when did it start?"      No 7. CAUSE: "What do you think is causing the leg swelling?"     "He normally has swelling but has been sedentary" 8. MEDICAL HISTORY: "Do you have a history of heart failure, kidney disease, liver failure, or cancer?"     No he has HTN on 2 BP meds 9. RECURRENT SYMPTOM: "Have you had leg swelling before?" If so, ask: "When was the last time?" "What happened that time?"     Yes but just ankle and feet 10. OTHER SYMPTOMS: "Do you have any other symptoms?" (e.g., chest pain, difficulty breathing)       no 11. PREGNANCY: "Is there any chance you are pregnant?" "When was your last menstrual period?"       n/a  Protocols used: LEG SWELLING AND EDEMA-A-AH

## 2017-07-16 NOTE — Telephone Encounter (Signed)
Copied from [place] [id number]. Topic: Inquiry >> Jul 16, 2017  4:28 PM Bea Graff, NT wrote: Reason for CRM: Patient had shoulder surgery on 07/08/17. Since he has been swollen in his legs and the skin was so tight that his wife couldn't push the skin. She told him to get up and walk every 30 mins. The next day she could get a small indentation. And the next day she had him to walk more and then could press a little further. Today she was getting him dressed in his jeans and they were so hard to get on due to his legs and feet being so swollen. He is already on a dieretic. His legs are not warm to the touch. She is concerned. She states she will put compression hose on him tonight. She is speaking with one of our triage nurses but also wants Dr. Danise Mina be aware.

## 2017-07-17 NOTE — Telephone Encounter (Signed)
See other note

## 2017-07-17 NOTE — Telephone Encounter (Signed)
Agree with recommendations.  Call for update on Monday after [place] visit. Could consider increased lasix to BID for 3 days to help as well but most important to try and stay active during the day and elevate legs when sitting.

## 2017-07-19 NOTE — Telephone Encounter (Signed)
Pt refused to go to [facility]. Pt has been more active, is sleeping in recliner and wearing compression hose. Pt states edema is pitting slightly now. Would like to try and increase Lasix but will do whatever Dr Danise Mina.

## 2017-07-19 NOTE — Telephone Encounter (Signed)
Left message on vm per dpr asking pt to call back with update since Urgent Care visit.  Also, relayed message per Dr. Darnell Level.

## 2017-07-19 NOTE — Telephone Encounter (Signed)
Increase lasix to BID dosing over next 3 days, schedule office visit this week for eval in office.

## 2017-07-20 NOTE — Telephone Encounter (Signed)
Left message on vm per dpr relaying instructions per Dr. Darnell Level.

## 2017-07-21 ENCOUNTER — Encounter: Payer: Self-pay | Admitting: Family Medicine

## 2017-07-21 ENCOUNTER — Ambulatory Visit (INDEPENDENT_AMBULATORY_CARE_PROVIDER_SITE_OTHER): Payer: Medicare Other | Admitting: Family Medicine

## 2017-07-21 VITALS — BP 132/80 | HR 96 | Temp 97.9°F | Wt 342.0 lb

## 2017-07-21 DIAGNOSIS — M75111 Incomplete rotator cuff tear or rupture of right shoulder, not specified as traumatic: Secondary | ICD-10-CM

## 2017-07-21 DIAGNOSIS — G4733 Obstructive sleep apnea (adult) (pediatric): Secondary | ICD-10-CM | POA: Diagnosis not present

## 2017-07-21 DIAGNOSIS — Z6841 Body Mass Index (BMI) 40.0 and over, adult: Secondary | ICD-10-CM

## 2017-07-21 DIAGNOSIS — R600 Localized edema: Secondary | ICD-10-CM | POA: Diagnosis not present

## 2017-07-21 DIAGNOSIS — I890 Lymphedema, not elsewhere classified: Secondary | ICD-10-CM | POA: Insufficient documentation

## 2017-07-21 NOTE — Progress Notes (Signed)
BP 132/80 (BP Location: Left Arm, Patient Position: Sitting, Cuff Size: Large)   Pulse 96   Temp 97.9 F (36.6 C) (Oral)   Wt (!) 342 lb (155.1 kg)   SpO2 96%   BMI 49.07 kg/m    CC: pedal edema Subjective:    Patient ID: William Hebert, male    DOB: 12-Feb-1946, 71 y.o.   MRN: [id number]  HPI: William Hebert is a 71 y.o. male presenting on 07/21/2017 for Follow-up (per Dr. Darnell Level due to edema in bilateral lower LEs since surgery)   See recent phone note for details. Recent R shoulder surgery 07/08/2017 (mini open rotator cuff surgery by Dr Tonita Cong). Recovering well from surgery.   Friday noticed taught pedal edema to knees. As he has increased activity there has been some improvement.  Trouble getting compression stockings on - he doesn't like to wear because they hurt.  Normally takes lasix 40mg  daily.   Relevant past medical, surgical, family and social history reviewed and updated as indicated. Interim medical history since our last visit reviewed. Allergies and medications reviewed and updated. Outpatient Medications Prior to Visit  Medication Sig Dispense Refill  . aspirin 81 MG chewable tablet Chew daily by mouth.    . Cholecalciferol (VITAMIN D3) 50000 units CAPS Take 50,000 Units by mouth every Friday. IN THE MORNING.  1  . diclofenac sodium (VOLTAREN) 1 % GEL Apply 1 application topically 3 (three) times daily as needed (PAIN).    . furosemide (LASIX) 40 MG tablet TAKE 1 TABLET BY MOUTH  DAILY 90 tablet 1  . labetalol (NORMODYNE) 200 MG tablet Take 1 tablet (200 mg total) daily by mouth. 90 tablet 0  . Misc Natural Products (OSTEO BI-FLEX ADV TRIPLE ST) TABS Take 1 tablet by mouth daily. WITH TUMERIC    . Multiple Vitamins-Minerals (MULTIVITAMIN ADULT PO) Take 1 tablet by mouth daily. MEN'S 50+ MULTIVITAMIN.    [name] NIFEdipine (PROCARDIA XL/ADALAT-CC) 60 MG 24 hr tablet TAKE 1 TABLET BY MOUTH  DAILY 90 tablet 1  . omeprazole (PRILOSEC) 20 MG capsule TAKE 1 CAPSULE BY MOUTH TWO  TIMES DAILY (Patient taking differently: TAKE 1 CAPSULE BY MOUTH ONCE DAILY IN THE MORNING) 180 capsule 1  . potassium chloride SA (K-DUR,KLOR-CON) 20 MEQ tablet TAKE 1 TABLET BY MOUTH  DAILY 90 tablet 1  . pravastatin (PRAVACHOL) 80 MG tablet TAKE 1 TABLET BY MOUTH  DAILY (Patient taking differently: TAKE 1 TABLET BY MOUTH  DAILY AT NIGHT) 90 tablet 1  . Zinc 100 MG TABS Take 1 tablet by mouth daily.    [name] docusate sodium (COLACE) 100 MG capsule Take 1 capsule (100 mg total) 2 (two) times daily as needed by mouth for mild constipation. 30 capsule 1  . oxyCODONE-acetaminophen (PERCOCET) 10-325 MG tablet Take 1-2 tablets every 4 (four) hours as needed by mouth for pain (severe). 40 tablet 0  . polyethylene glycol (MIRALAX / GLYCOLAX) packet Take 17 g daily by mouth. 14 each 0   No facility-administered medications prior to visit.      Per HPI unless specifically indicated in ROS section below Review of Systems     Objective:    BP 132/80 (BP Location: Left Arm, Patient Position: Sitting, Cuff Size: Large)   Pulse 96   Temp 97.9 F (36.6 C) (Oral)   Wt (!) 342 lb (155.1 kg)   SpO2 96%   BMI 49.07 kg/m   Wt Readings from Last 3 Encounters:  07/21/17 (!) 342 lb (155.1  kg)  07/08/17 (!) 334 lb (151.5 kg)  07/06/17 (!) 334 lb (151.5 kg)    Physical Exam  Constitutional: He appears well-developed and well-nourished. No distress.  HENT:  Mouth/Throat: Oropharynx is clear and moist. No oropharyngeal exudate.  Cardiovascular: Normal rate, regular rhythm, normal heart sounds and intact distal pulses.  No murmur heard. Pulmonary/Chest: Effort normal and breath sounds normal. No respiratory distress. He has no wheezes. He has no rales.  Musculoskeletal: He exhibits edema (tight pedal edema from feet to knees).  Skin: Skin is warm and dry. No erythema.  Nursing note and vitals reviewed.  Results for orders placed or performed during the hospital encounter of 02/58/52  Basic metabolic panel   Result Value Ref Range   Sodium 141 135 - 145 mmol/L   Potassium 3.3 (L) 3.5 - 5.1 mmol/L   Chloride 103 101 - 111 mmol/L   CO2 27 22 - 32 mmol/L   Glucose, Bld 142 (H) 65 - 99 mg/dL   BUN 19 6 - 20 mg/dL   Creatinine, Ser 1.24 0.61 - 1.24 mg/dL   Calcium 9.3 8.9 - 10.3 mg/dL   GFR calc non Af Amer 58 (L) >60 mL/min   GFR calc Af Amer >60 >60 mL/min   Anion gap 11 5 - 15      Assessment & Plan:   Problem List Items Addressed This Visit    Body mass index (BMI) of 45.0 to 49.9 in adult [facility])   Incomplete rotator cuff tear or rupture of right shoulder, not specified as traumatic   Obstructive sleep apnea   Pedal edema - Primary    Marked, worse after surgery likely multifactorial but anticipate related to morbid obesity, increased inactivity. No known CHF hx. Supportive care reviewed - elevation of legs, increased activity, avoidance of sodium and increased water, compression stockings. Discussed best long term treatment will be weight loss and encouraged efforts to increase activity for goal sustainable weight loss. For short term recommended increased lasix to 40mg  BID x 3 days. Call us Monday with update.  If increased need noted, would also increase KDur.           Follow up plan: Return if symptoms worsen or fail to improve.  Ria Bush, MD

## 2017-07-21 NOTE — Patient Instructions (Addendum)
Increase lasix to 40mg  twice daily for next 3 days. Update [place] with how you're doing.  Limit salt/sodium in diet.  Increase water over the next week Stay active during the day, keep legs up when at home.  Consider getting scale for home use.

## 2017-07-21 NOTE — Assessment & Plan Note (Addendum)
Marked, worse after surgery likely multifactorial but anticipate related to morbid obesity, increased inactivity. No known CHF hx. Supportive care reviewed - elevation of legs, increased activity, avoidance of sodium and increased water, compression stockings. Discussed best long term treatment will be weight loss and encouraged efforts to increase activity for goal sustainable weight loss. For short term recommended increased lasix to 40mg  BID x 3 days. Call us Monday with update.  If increased need noted, would also increase KDur.

## 2017-07-28 ENCOUNTER — Telehealth: Payer: Self-pay | Admitting: Family Medicine

## 2017-07-28 NOTE — Telephone Encounter (Signed)
Copied from [place] [id number]. Topic: Quick Communication - See Telephone Encounter >> Jul 28, 2017  4:54 PM Boyd Kerbs wrote: CRM for notification. See Telephone encounter for:  Drue Novel, wife, message - Has did 3 days increase in diacritic- he has begun to walk by himself a couple of times, about 1/2 mile a couple of days.  His legs are hurting though.  Swelling in his legs is much better and he is moving.  Starts Physical therapy next Thursday, he will have more movement.   He has to go to bathroom A lot. Question is does doctor want to adjustment of 1/2 pill?  Please advise can leave message on voice mail.  07/28/17.

## 2017-07-28 NOTE — Telephone Encounter (Signed)
I'm glad swelling is better.  Recommend we back down to lasix 40mg  once daily his prior dose.  If legs hurting too much rec slow down on walking - decrease to quarter mile at a time - use pain as a guide.

## 2017-07-29 NOTE — Telephone Encounter (Addendum)
Spoke with pt's wife, Carlyon Shadow (on dpr), relaying message and instructions per Dr. Darnell Level.  Says ok.

## 2017-07-30 ENCOUNTER — Ambulatory Visit
Admission: RE | Admit: 2017-07-30 | Discharge: 2017-07-30 | Disposition: A | Discharge status: Home / Self Care | Payer: Medicare Other | Source: Ambulatory Visit | Attending: Family | Admitting: Family

## 2017-07-30 DIAGNOSIS — I714 Abdominal aortic aneurysm, without rupture, unspecified: Secondary | ICD-10-CM

## 2017-07-30 DIAGNOSIS — Z95828 Presence of other vascular implants and grafts: Secondary | ICD-10-CM

## 2017-07-30 MED ORDER — IOPAMIDOL (ISOVUE-370) INJECTION 76%
175.0000 mL | Freq: Once | INTRAVENOUS | Status: AC | PRN
  Administered 2017-07-30: 175 mL via INTRAVENOUS

## 2017-08-02 ENCOUNTER — Ambulatory Visit (INDEPENDENT_AMBULATORY_CARE_PROVIDER_SITE_OTHER): Payer: Medicare Other | Admitting: Family

## 2017-08-02 ENCOUNTER — Ambulatory Visit: Payer: Medicare Other | Admitting: Family

## 2017-08-02 ENCOUNTER — Encounter: Payer: Self-pay | Admitting: Family

## 2017-08-02 VITALS — BP 152/79 | HR 70 | Temp 97.2°F | Resp 17 | Wt 339.1 lb

## 2017-08-02 DIAGNOSIS — I714 Abdominal aortic aneurysm, without rupture, unspecified: Secondary | ICD-10-CM

## 2017-08-02 DIAGNOSIS — Z95828 Presence of other vascular implants and grafts: Secondary | ICD-10-CM

## 2017-08-02 NOTE — Progress Notes (Signed)
[facility] [place]  CC: Follow up s/p Endovascular Repair of Abdominal Aortic Aneurysm    History of Present Illness  William Hebert is a 71 y.o. (12-Sep-1945) male who is status post abdominal aorto biiliac stent graft repair for abdominal aortic aneurysm on 05-20-2011 by Dr. Kellie Simmering. He has done well from that standpoint. Aneurysm was approximately 7 cm at the time of his procedure. He has had no complications relative to it.  12/25/14 carotid duplex demonstrated <50% bilateral ICA stenoses.  He denies any hx of stroke or TIA.   He denies claudication symptoms with walking, but does state that after walking 1/4 mile both shins "throb".   He walks 1/4 to 1 mile, 2-3 days/week.   He had right rotator cuff surgery on 07-08-17, started PT later this week.   Pt Diabetic: yes, A1C on 06-14-17 was 6.5 Pt smoker: former smoker, quit in 2007, started about age 18 years   Past Medical History:  Diagnosis Date  . AAA (abdominal aortic aneurysm) without rupture ([place]) 07/10/2014   S/p endovascular repair 2012 with stent Kellie Simmering), yearly f/u  . Arthritis   . Cancer ([place])    skin cancer non melanioma  . Chronic bronchitis ([place]) 11/2016 AND 10-2016  . COPD (chronic obstructive pulmonary disease) ([facility])    chronic bronchitis  . GERD (gastroesophageal reflux disease)   . Headache(784.0)    in 20's  . Hepatitis    Carrier cant give blood gives a false positive  . Hernia of abdominal wall    LARGE FROM BREAST BONE AREA TO BELLY BUTTON  . History of skin cancer 2014  . HTN (hypertension)   . Hyperlipidemia   . Hypogonadism in male    prior on testosterone cream  . Morbidly obese ([place])   . Pneumonia   . PONV (postoperative nausea and vomiting)    PONV AFTER AAA REPAIR, ALL OTHER SURGERIES WENT OK  . Shortness of breath    WITH EXERTION  . Sleep apnea    CPAP    Past Surgical History:  Procedure Laterality Date  . ABDOMINAL AORTIC ANEURYSM REPAIR  05/20/2011   aorto  bifem. stent graft Kellie Simmering)  . CATARACT EXTRACTION Bilateral 2014   X 2 BOTH EYES DONE TWICE  . COLONOSCOPY WITH PROPOFOL N/A 02/23/2017   4 TA polyps, rpt 3-5 yrs ([place])  . ELBOW SURGERY  2012  . FOOT TENDON SURGERY Right   . KNEE SURGERY Right remote   arthroscopy x2  . MASS EXCISION N/A 05/10/2013   Procedure: EXCISION back MASS - cyst Marcello Moores A. Cornett, MD)  . SHOULDER OPEN ROTATOR CUFF REPAIR Right 07/08/2017   Procedure: Right shoulder mini open rotator cuff repair;  Surgeon: Susa Day, MD;  Location: [facility];  Service: Orthopedics;  Laterality: Right;  90 mins; Interscalene Block  . SKIN SURGERY     2 different surgeries face   . UMBILICAL HERNIA REPAIR  2002   Social History Social History   Tobacco Use  . Smoking status: Former Smoker    Packs/day: 2.00    Years: 40.00    Pack years: 80.00    Types: Cigarettes    Last attempt to quit: 08/31/2005    Years since quitting: 11.9  . Smokeless tobacco: Former Systems developer    Types: Snuff, Sarina Ser    Quit date: 07/06/2006  Substance Use Topics  . Alcohol use: Yes    Alcohol/week: 0.0 oz    Comment: rarely  . Drug use: No  Family History Family History  Problem Relation Age of Onset  . Cancer Mother 43       throat (nonsmoker)  . Heart disease Maternal Aunt   . Alcohol abuse Father        deceased 49yo   Current Outpatient Medications on File Prior to Visit  Medication Sig Dispense Refill  . aspirin 81 MG chewable tablet Chew daily by mouth.    . Cholecalciferol (VITAMIN D3) 50000 units CAPS Take 50,000 Units by mouth every Friday. IN THE MORNING.  1  . diclofenac sodium (VOLTAREN) 1 % GEL Apply 1 application topically 3 (three) times daily as needed (PAIN).    . furosemide (LASIX) 40 MG tablet TAKE 1 TABLET BY MOUTH  DAILY 90 tablet 1  . labetalol (NORMODYNE) 200 MG tablet Take 1 tablet (200 mg total) daily by mouth. 90 tablet 0  . Misc Natural Products (OSTEO BI-FLEX ADV TRIPLE ST) TABS Take 1 tablet by mouth daily. WITH  TUMERIC    . Multiple Vitamins-Minerals (MULTIVITAMIN ADULT PO) Take 1 tablet by mouth daily. MEN'S 50+ MULTIVITAMIN.    [name] NIFEdipine (PROCARDIA XL/ADALAT-CC) 60 MG 24 hr tablet TAKE 1 TABLET BY MOUTH  DAILY 90 tablet 1  . omeprazole (PRILOSEC) 20 MG capsule TAKE 1 CAPSULE BY MOUTH TWO TIMES DAILY (Patient taking differently: TAKE 1 CAPSULE BY MOUTH ONCE DAILY IN THE MORNING) 180 capsule 1  . potassium chloride SA (K-DUR,KLOR-CON) 20 MEQ tablet TAKE 1 TABLET BY MOUTH  DAILY 90 tablet 1  . pravastatin (PRAVACHOL) 80 MG tablet TAKE 1 TABLET BY MOUTH  DAILY (Patient taking differently: TAKE 1 TABLET BY MOUTH  DAILY AT NIGHT) 90 tablet 1  . Zinc 100 MG TABS Take 1 tablet by mouth daily.     No current facility-administered medications on file prior to visit.    No Known Allergies   ROS: See HPI for pertinent positives and negatives.  Physical Examination  Vitals:   08/02/17 1504  BP: (!) 152/79  Pulse: 70  Resp: 17  Temp: (!) 97.2 F (36.2 C)  TempSrc: Oral  SpO2: 95%  Weight: (!) 339 lb 1.6 oz (153.8 kg)   Body mass index is 48.66 kg/m.  General: A&O x 3, WD, morbidly obese male  HENT: PERRLA, large neck  Pulmonary: Sym exp, respirations are non labored, breath sounds are distant, CTAB, no rales, rhonchi, or wheezing.  Cardiac: Regular rhythm and rate, no murmur appreciated  Vascular: Vessel Right Left  Radial 2+Palpable 2+Palpable  Carotid  without bruit  without bruit  Aorta Not palpable N/A  Femoral Not Palpable Not Palpable  Popliteal Not palpable Not palpable  PT Not Palpable Not Palpable  DP 2+Palpable 2+Palpable   Gastrointestinal: soft, NTND, -G/R, - HSM, very large panus, - CVAT B.  Skin: No rash, no ulcers, no cellulitis. 1+ pitting edema in bilateral ankles.   Musculoskeletal: M/S 5/5 throughout, extremities without ischemic changes.  Neurologic: Pain and light touch intact in extremities, Motor exam as listed above     DATA  CTA abd/pelvis  (07-30-17): FINDINGS: VASCULAR Aorta: Re- demonstration of endovascular repair of abdominal aortic aneurysm with the main body delivered from the right femoral approach. Proximal graft terminates just below the lowest, left renal artery with no evidence of migration. The excluded aneurysm sac measures approximately 6.4 cm, decreased from the maximum diameter preoperatively of 7 cm. No evidence of type 1 or type 2 endoleak on the current CT. Bilateral limb of the stent graft patent. The  right terminates in the mid common iliac artery. Left limb terminates at the distal common iliac artery just above the bifurcation. Celiac: No significant atherosclerotic changes at the origin of the celiac artery. Typical branch pattern of the celiac artery, with left gastric, common hepatic, and splenic artery identified. SMA: No significant atherosclerotic changes of the superior mesenteric artery. Renals: Renal arteries are patent. Single left and single right renal artery. IMA: Inferior mesenteric artery is patent. Left colic artery patent. Superior rectal artery patent. Right lower extremity: Patent right iliac limb. Tortuous right iliac system. Hypogastric artery is patent. External iliac artery patent. Proximal femoral vasculature patent. Left lower extremity: Patent left iliac limb. Tortuous left iliac system. Hypogastric artery is patent. Patent external iliac artery and common femoral artery. Veins: Unremarkable appearance of the venous system.    CTA Abd/Pelvis Duplex (Date: 07-09-15) Aorta: Tortuous distal descending thoracic segment. Suprarenal segment unremarkable. Patent bifurcated infrarenal stent graft. No endoleak. Native sac diameter 6.6 x 6.9 cm (previously 6.9 x 7). IMPRESSION: 1. Patent infrarenal bifurcated aortic stent graft with no endoleak, slight decrease in size of native aneurysm sac from 7 to 6.9 cm maximum axial diameter.     EVAR Duplex (Date:  07/28/16):  AAA sac size: 6.7 cm AP x transverse diameter not visualized; bilateral CIA's not visualized.  no endoleak detected, based on limited visualization due to patient body habitus.  12/25/14 carotid duplex demonstrated <50% bilateral ICA stenoses.    Medical Decision Making  GEAN LAROSE is a 71 y.o. male who is status post abdominal aorto biiliac stent graft repair for abdominal aortic aneurysm on 05-20-2011. Duplex of the EVAR have not yielded much information due to very large panus. AAA sac size decreased to 6.4 cm with no endoleak on 07-30-17 CTA abd/pelvis, compared to 6.9 cm on 07-09-15 CTA.  I discussed the above with Dr. Trula Slade.   I discussed with the patient the importance of surveillance of the endograft.  The next CTA will be scheduled for 12 months.  The patient will follow up with [place] in 12 months with these studies.  I emphasized the importance of maximal medical management including strict control of blood pressure, blood glucose, and lipid levels, antiplatelet agents, obtaining regular exercise, and cessation of smoking.   Thank you for allowing [place] to participate in this patient's care.  Clemon Chambers, RN, MSN, FNP-C [facility] [place] Office: [phone]  Clinic Physician: Trula Slade  08/02/2017, 3:22 PM

## 2017-09-02 ENCOUNTER — Other Ambulatory Visit: Payer: Self-pay | Admitting: Family Medicine

## 2017-11-17 ENCOUNTER — Other Ambulatory Visit: Payer: Self-pay | Admitting: Family Medicine

## 2017-12-06 ENCOUNTER — Other Ambulatory Visit: Payer: Self-pay | Admitting: Family Medicine

## 2017-12-19 ENCOUNTER — Other Ambulatory Visit: Payer: Self-pay | Admitting: Family Medicine

## 2018-01-25 ENCOUNTER — Encounter: Payer: Self-pay | Admitting: Family Medicine

## 2018-02-08 ENCOUNTER — Telehealth: Payer: Self-pay | Admitting: Family Medicine

## 2018-02-08 NOTE — Telephone Encounter (Signed)
Copied from [place] [id number]. Topic: Quick Communication - Rx Refill/Question >> Feb 08, 2018  2:23 PM Boyd Kerbs wrote: Medication:   C-Pap machine His is no longer working.   Has the patient contacted their pharmacy? Yes.   (Agent: If no, request that the patient contact the pharmacy for the refill.) (Agent: If yes, when and what did the pharmacy advise?)  Preferred Pharmacy (with phone number or street name): [facility]   [phone]  Agent: Please be advised that RX refills may take up to 3 business days. We ask that you follow-up with your pharmacy.

## 2018-02-09 NOTE — Telephone Encounter (Signed)
Spoke with pt and his wife, Carlyon Shadow, asking about CPAP.  Says his previous PCP, Dr. Arlyss Queen (now retired), was who he thinks prescribed CPAP. Gets CPAP supplies via [facility].  Pt can be reached at  [phone] and gives permission to lvm.

## 2018-02-09 NOTE — Telephone Encounter (Signed)
Patient is needing another CPAP machine

## 2018-02-09 NOTE — Telephone Encounter (Signed)
Unable to reach pt by phone to see who prescribed cpap for pt and who handles supplies and machine for cpap.

## 2018-02-11 ENCOUNTER — Encounter: Payer: Self-pay | Admitting: Family Medicine

## 2018-02-11 ENCOUNTER — Ambulatory Visit (INDEPENDENT_AMBULATORY_CARE_PROVIDER_SITE_OTHER): Payer: Medicare Other | Admitting: Family Medicine

## 2018-02-11 VITALS — BP 144/74 | HR 71 | Temp 98.0°F | Ht 70.0 in | Wt 337.8 lb

## 2018-02-11 DIAGNOSIS — J209 Acute bronchitis, unspecified: Secondary | ICD-10-CM

## 2018-02-11 DIAGNOSIS — J42 Unspecified chronic bronchitis: Secondary | ICD-10-CM

## 2018-02-11 MED ORDER — HYDROCODONE-HOMATROPINE 5-1.5 MG/5ML PO SYRP: 5.0000 mL | ORAL_SOLUTION | Freq: Three times a day (TID) | ORAL | 0 refills | Status: DC | PRN

## 2018-02-11 MED ORDER — AZITHROMYCIN 250 MG PO TABS: ORAL_TABLET | ORAL | 0 refills | Status: DC

## 2018-02-11 MED ORDER — ALBUTEROL SULFATE (2.5 MG/3ML) 0.083% IN NEBU
2.5000 mg | INHALATION_SOLUTION | Freq: Once | RESPIRATORY_TRACT | Status: AC
  Administered 2018-02-11: 2.5 mg via RESPIRATORY_TRACT

## 2018-02-11 MED ORDER — BENZONATATE 200 MG PO CAPS: 200.0000 mg | ORAL_CAPSULE | Freq: Three times a day (TID) | ORAL | 1 refills | Status: DC | PRN

## 2018-02-11 MED ORDER — PREDNISONE 10 MG PO TABS: ORAL_TABLET | ORAL | 0 refills | Status: DC

## 2018-02-11 NOTE — Telephone Encounter (Signed)
Spoke with pt's wife, Carlyon Shadow (on current dpr), relaying Dr. Synthia Innocent message.  Verbalizes understanding and says their daughter will pick up the rx and sleep study.   Placed rx and sleep study at front office.

## 2018-02-11 NOTE — Patient Instructions (Signed)
Drink fluids and rest   Take prednisone and zpak for bronchitis  Hycodan for cough with caution of sedation  Also tessalon for cough    Update if not starting to improve in a week or if worsening

## 2018-02-11 NOTE — Progress Notes (Signed)
Subjective:    Patient ID: William Hebert, male    DOB: 02/23/1946, 72 y.o.   MRN: [id number]  HPI Here for cough and congestion  Sick for over a week  Started cough/fever last weekend    otc medication  Vit C  vics vapor rub  mucinex DM -helped initially  Has albuterol inhaler   Hx of chronic bronchitis - for years  Fair amt of wheezing    Took 7 d of doxycycline and no help   Temp: 98 F (36.7 C)   Patient Active Problem List   Diagnosis Date Noted  . Acute bronchitis 02/11/2018  . Pedal edema 07/21/2017  . Incomplete rotator cuff tear or rupture of right shoulder, not specified as traumatic 07/08/2017  . Complete rotator cuff tear 07/08/2017  . Pre-op evaluation 06/14/2017  . Vitamin D deficiency 06/14/2017  . Prediabetes 06/14/2017  . Health maintenance examination 12/22/2016  . Advanced care planning/counseling discussion 12/22/2016  . Lipoma of skin and subcutaneous tissue of neck 04/14/2016  . AAA (abdominal aortic aneurysm) without rupture ([place]) 07/10/2014  . Body mass index (BMI) of 45.0 to 49.9 in adult ([place]) 03/27/2014  . Osteoarthritis of both knees 03/27/2014  . Hypogonadism in male 03/06/2014  . S/P AAA (abdominal aortic aneurysm) repair 07/04/2013  . Dyslipidemia 06/16/2012  . Hypertension 06/16/2012  . Erectile dysfunction 06/16/2012  . Chronic bronchitis ([place]) 04/13/2011  . Obstructive sleep apnea 04/13/2011   Past Medical History:  Diagnosis Date  . AAA (abdominal aortic aneurysm) without rupture ([place]) 07/10/2014   S/p endovascular repair 2012 with stent Kellie Simmering), yearly f/u  . Arthritis   . Cancer ([place])    skin cancer non melanioma  . Chronic bronchitis ([place]) 11/2016 AND 10-2016  . COPD (chronic obstructive pulmonary disease) ([facility])    chronic bronchitis  . GERD (gastroesophageal reflux disease)   . Headache(784.0)    in 20's  . Hepatitis    Carrier cant give blood gives a false positive  . Hernia of abdominal wall    LARGE FROM  BREAST BONE AREA TO BELLY BUTTON  . History of skin cancer 2014  . HTN (hypertension)   . Hyperlipidemia   . Hypogonadism in male    prior on testosterone cream  . Morbidly obese ([place])   . Pneumonia   . PONV (postoperative nausea and vomiting)    PONV AFTER AAA REPAIR, ALL OTHER SURGERIES WENT OK  . Shortness of breath    WITH EXERTION  . Sleep apnea    CPAP    Past Surgical History:  Procedure Laterality Date  . ABDOMINAL AORTIC ANEURYSM REPAIR  05/20/2011   aorto bifem. stent graft Kellie Simmering)  . CATARACT EXTRACTION Bilateral 2014   X 2 BOTH EYES DONE TWICE  . COLONOSCOPY WITH PROPOFOL N/A 02/23/2017   4 TA polyps, rpt 3-5 yrs ([place])  . ELBOW SURGERY  2012  . FOOT TENDON SURGERY Right   . KNEE SURGERY Right remote   arthroscopy x2  . MASS EXCISION N/A 05/10/2013   Procedure: EXCISION back MASS - cyst Marcello Moores A. Cornett, MD)  . SHOULDER OPEN ROTATOR CUFF REPAIR Right 07/08/2017   Procedure: Right shoulder mini open rotator cuff repair;  Surgeon: Susa Day, MD;  Location: [facility];  Service: Orthopedics;  Laterality: Right;  90 mins; Interscalene Block  . SKIN SURGERY     2 different surgeries face   . UMBILICAL HERNIA REPAIR  2002   Social History   Tobacco Use  . Smoking  status: Former Smoker    Packs/day: 2.00    Years: 40.00    Pack years: 80.00    Types: Cigarettes    Last attempt to quit: 08/31/2005    Years since quitting: 12.4  . Smokeless tobacco: Former Systems developer    Types: Snuff, Sarina Ser    Quit date: 07/06/2006  Substance Use Topics  . Alcohol use: Yes    Alcohol/week: 0.0 oz    Comment: rarely  . Drug use: No   Family History  Problem Relation Age of Onset  . Cancer Mother 74       throat (nonsmoker)  . Heart disease Maternal Aunt   . Alcohol abuse Father        deceased 23yo   No Known Allergies Current Outpatient Medications on File Prior to Visit  Medication Sig Dispense Refill  . albuterol (PROVENTIL HFA;VENTOLIN HFA) 108 (90 Base) MCG/ACT inhaler  Inhale 1-2 puffs into the lungs every 4 (four) hours as needed for wheezing or shortness of breath.    . Ascorbic Acid (VITAMIN C PO) Take 1,000 mg by mouth daily.    [name] aspirin 81 MG chewable tablet Chew daily by mouth.    . Cholecalciferol (VITAMIN D3) 50000 units CAPS Take 50,000 Units by mouth every Friday. IN THE MORNING.  1  . Cholecalciferol (VITAMIN D3) 50000 units CAPS TAKE 1 CAPSULE BY MOUTH ONE TIME PER WEEK 12 capsule 1  . furosemide (LASIX) 40 MG tablet TAKE 1 TABLET BY MOUTH  DAILY 90 tablet 0  . labetalol (NORMODYNE) 200 MG tablet TAKE 1 TABLET DAILY BY  MOUTH. 90 tablet 1  . Multiple Vitamins-Minerals (MULTIVITAMIN ADULT PO) Take 1 tablet by mouth daily. MEN'S 50+ MULTIVITAMIN.    [name] NIFEdipine (PROCARDIA XL/ADALAT-CC) 60 MG 24 hr tablet TAKE 1 TABLET BY MOUTH  DAILY 90 tablet 0  . omeprazole (PRILOSEC) 20 MG capsule TAKE 1 CAPSULE BY MOUTH TWICE A DAY 180 capsule 0  . potassium chloride SA (K-DUR,KLOR-CON) 20 MEQ tablet TAKE 1 TABLET BY MOUTH  DAILY 90 tablet 0  . pravastatin (PRAVACHOL) 80 MG tablet Take 1 by mouth daily at night. NEEDS OFFICE VISIT 90 tablet 0  . Probiotic Product (PROBIOTIC PO) Take 1 tablet by mouth daily.    . Turmeric 500 MG TABS Take 1 tablet by mouth daily.    . Zinc 100 MG TABS Take 1 tablet by mouth daily.     No current facility-administered medications on file prior to visit.     Review of Systems  Constitutional: Positive for appetite change and fatigue. Negative for fever.       No longer has elevated temp   HENT: Positive for congestion, postnasal drip, rhinorrhea, sinus pressure, sneezing and sore throat. Negative for ear pain.   Eyes: Negative for pain and discharge.  Respiratory: Positive for cough and wheezing. Negative for shortness of breath and stridor.   Cardiovascular: Negative for chest pain.  Gastrointestinal: Negative for diarrhea, nausea and vomiting.  Genitourinary: Negative for frequency, hematuria and urgency.  Musculoskeletal:  Negative for arthralgias and myalgias.  Skin: Negative for rash.  Neurological: Positive for headaches. Negative for dizziness, weakness and light-headedness.  Psychiatric/Behavioral: Negative for confusion and dysphoric mood.       Objective:   Physical Exam  Constitutional: He appears well-developed and well-nourished. No distress.  Morbidly obese  Bad hacking cough   HENT:  Head: Normocephalic and atraumatic.  Right Ear: External ear normal.  Left Ear: External ear normal.  Mouth/Throat:  Oropharynx is clear and moist.  Nares are injected and congested  No sinus tenderness Clear rhinorrhea and post nasal drip   Eyes: Pupils are equal, round, and reactive to light. Conjunctivae and EOM are normal. Right eye exhibits no discharge. Left eye exhibits no discharge.  Neck: Normal range of motion. Neck supple.  Cardiovascular: Normal rate and normal heart sounds.  Pulmonary/Chest: Effort normal. No stridor. No respiratory distress. He has wheezes. He has no rales. He exhibits no tenderness.  Scant wheeze on forced exp only  Scattered rhonchi  No rales   Coughing spells are severe   Lymphadenopathy:    He has no cervical adenopathy.  Neurological: He is alert. No cranial nerve deficit. Coordination normal.  Skin: Skin is warm and dry. No rash noted.  Psychiatric: He has a normal mood and affect.          Assessment & Plan:   Problem List Items Addressed This Visit      Respiratory   Acute bronchitis    In pt with chronic bronchitis with persistent severe cough (after 1 wk of doxycycline and home care) Albuterol NMT today  zpak  Prednisone taper  Hycodan and tessalon for cough (caution of sedation)  Disc symptomatic care - see instructions on AVS t Update if not starting to improve in a week or if worsening    Meds ordered this encounter  Medications  . azithromycin (ZITHROMAX Z-PAK) 250 MG tablet    Sig: Take 2 pills by mouth today and then 1 pill daily for 4 days      Dispense:  6 tablet    Refill:  0  . HYDROcodone-homatropine (HYCODAN) 5-1.5 MG/5ML syrup    Sig: Take 5 mLs by mouth every 8 (eight) hours as needed for cough.    Dispense:  120 mL    Refill:  0  . predniSONE (DELTASONE) 10 MG tablet    Sig: Take 3 pills once daily by mouth for 3 days, then 2 pills once daily for 3 days, then 1 pill once daily for 3 days and then stop    Dispense:  18 tablet    Refill:  0  . benzonatate (TESSALON) 200 MG capsule    Sig: Take 1 capsule (200 mg total) by mouth 3 (three) times daily as needed. Do not bite pill    Dispense:  30 capsule    Refill:  1  . albuterol (PROVENTIL) (2.5 MG/3ML) 0.083% nebulizer solution 2.5 mg         Relevant Medications   albuterol (PROVENTIL) (2.5 MG/3ML) 0.083% nebulizer solution 2.5 mg (Completed)

## 2018-02-11 NOTE — Telephone Encounter (Signed)
Looks like Dr Einar Gip cardiology may have managed OSA before.  I have printed out prior sleep study and written out Rx for new CPAP machine and placed in Lisa's box. If this is not sufficient, may need to check with Dr Einar Gip or refer to sleep doctors for new evaluation.

## 2018-02-11 NOTE — Assessment & Plan Note (Signed)
In pt with chronic bronchitis with persistent severe cough (after 1 wk of doxycycline and home care) Albuterol NMT today  zpak  Prednisone taper  Hycodan and tessalon for cough (caution of sedation)  Disc symptomatic care - see instructions on AVS t Update if not starting to improve in a week or if worsening    Meds ordered this encounter  Medications  . azithromycin (ZITHROMAX Z-PAK) 250 MG tablet    Sig: Take 2 pills by mouth today and then 1 pill daily for 4 days    Dispense:  6 tablet    Refill:  0  . HYDROcodone-homatropine (HYCODAN) 5-1.5 MG/5ML syrup    Sig: Take 5 mLs by mouth every 8 (eight) hours as needed for cough.    Dispense:  120 mL    Refill:  0  . predniSONE (DELTASONE) 10 MG tablet    Sig: Take 3 pills once daily by mouth for 3 days, then 2 pills once daily for 3 days, then 1 pill once daily for 3 days and then stop    Dispense:  18 tablet    Refill:  0  . benzonatate (TESSALON) 200 MG capsule    Sig: Take 1 capsule (200 mg total) by mouth 3 (three) times daily as needed. Do not bite pill    Dispense:  30 capsule    Refill:  1  . albuterol (PROVENTIL) (2.5 MG/3ML) 0.083% nebulizer solution 2.5 mg

## 2018-02-14 ENCOUNTER — Ambulatory Visit (INDEPENDENT_AMBULATORY_CARE_PROVIDER_SITE_OTHER)
Admission: RE | Admit: 2018-02-14 | Discharge: 2018-02-14 | Disposition: A | Discharge status: Home / Self Care | Payer: Medicare Other | Source: Ambulatory Visit | Attending: Family Medicine | Admitting: Family Medicine

## 2018-02-14 ENCOUNTER — Telehealth: Payer: Self-pay | Admitting: Family Medicine

## 2018-02-14 ENCOUNTER — Telehealth: Payer: Self-pay

## 2018-02-14 DIAGNOSIS — J209 Acute bronchitis, unspecified: Secondary | ICD-10-CM

## 2018-02-14 DIAGNOSIS — J42 Unspecified chronic bronchitis: Secondary | ICD-10-CM

## 2018-02-14 MED ORDER — HYDROCOD POLST-CPM POLST ER 10-8 MG/5ML PO SUER: 5.0000 mL | Freq: Two times a day (BID) | ORAL | 0 refills | Status: DC | PRN

## 2018-02-14 NOTE — Telephone Encounter (Signed)
Copied from [place] [phone]. Topic: Inquiry >> Feb 14, 2018  2:29 PM Scherrie Gerlach wrote: Reason for CRM: Pt saw Dr Glori Bickers on Friday and is not any better.  Pt continues cough, cannot even leave the house.  Cough is rattling. Wife wants to know what they need to do next. Pt was given several meds and not any better. No fever.  Worse when pt tries to talk. Wants to know if he should see a specialist, or see him again. All the meds pt was given on Friday, he is not better. Wife states pt has gone through the same thing several years ago, Dr Darnell Level is his pcp

## 2018-02-14 NOTE — Telephone Encounter (Signed)
Please let pt know that his cxr does not show any pneumonia  I discussed case with pcp (Dr Darnell Level) and he wants to try tussionex (a different cough medicine)- if not improved (or worse) please schedule f/u with Dr Darnell Level  Caution of sedation with tussionex Do not mix with the hycodan  I sent it to [facility]

## 2018-02-14 NOTE — Telephone Encounter (Signed)
Wife will bring him now for chest xray

## 2018-02-14 NOTE — Telephone Encounter (Signed)
Agree with Dr Marliss Coots recommendations.  If no better, f/u with me, or may need to establish with pulm for formal PFTs and OSA on CPAP.

## 2018-02-14 NOTE — Telephone Encounter (Signed)
Pt last seen 02/11/18.Please advise.

## 2018-02-14 NOTE — Telephone Encounter (Signed)
I think he should come in for a CXR  He is on zpak (after finishing doxycycline) and also prednisone taper and hycodan  If struggling to breathe -go to ED Otherwise let me know when he is coming and I will place order for CXR  I will cc to his PCP

## 2018-02-15 ENCOUNTER — Telehealth: Payer: Self-pay

## 2018-02-15 NOTE — Telephone Encounter (Signed)
May send office note from 06/14/2017 to see if that suffices

## 2018-02-15 NOTE — Telephone Encounter (Signed)
Advised wife of results below-Bana Borgmeyer V Trishia Cuthrell, RMA

## 2018-02-15 NOTE — Telephone Encounter (Signed)
Patient's wife called, states that patient turned in RX for CPAP and sleep study results to [facility] and they requesting an office note where patient is seen and documented that he is using CPAP and medical necessity. Advised wife from previous telephone note that patient may need to go back to Dr Nadyne Coombes or get pulmonology referral. Mrs Treece states that would take a long time and patient's CPAP machine will not last too much longer and he has to have this. Advised Mrs Ferencz I would send a message back to ask Dr Danise Mina on how to proceed. Patient is not having any trouble with CPAP besides just needing a new one due to having current one for so long. [phone]-Rilya Longo V Jayme Mednick, RMA

## 2018-02-16 NOTE — Telephone Encounter (Addendum)
Left message on vm per dpr informing pt I have printed an office note, per Dr. Darnell Level that may help with his CPAP replacement.  Also, asked pt to call back letting us know if he wants to pick it up or if he has the name of someone at [facility] and a fax number we could send it to for him.  [Office note is printed and in basket on [employer]

## 2018-02-17 ENCOUNTER — Telehealth: Payer: Self-pay

## 2018-02-17 NOTE — Telephone Encounter (Signed)
Copied from [place] [id number]. Topic: Quick Communication - See Telephone Encounter >> Feb 17, 2018 71:21 AM Brenton Grills, CMA wrote: CRM for notification. See Telephone encounter for: 02/15/18.  PEC may relay message and document what pt wants [place] to do with office note.  Message: Left message on vm per dpr informing pt I have printed an office note, per Dr. Darnell Level that may help with his CPAP replacement.  Also, asked pt to call back letting us know if he wants to pick it up or if he has the name of someone at [facility] and a fax number we could send it to for him.  >> Feb 17, 2018 12:03 PM Cecelia Byars, NT wrote: Patients wife called back with the fax number and it is [phone], attention Barbaraann Rondo please  .

## 2018-02-17 NOTE — Telephone Encounter (Signed)
Noted. [facility] [place] [phone].

## 2018-02-17 NOTE — Telephone Encounter (Signed)
Barbaraann Rondo calling from [place] states that the OV note he received for the patient was too old. It looks like an addendum was created from [place] 06/14/17 and he needs an OV within the last 6 months.. He states he seen were the patient may not of been seen in the last 6 months and will need a face to face before this request can be filled.  If you have any question please call Barbaraann Rondo CB#  [phone] ext [phone]  He is also going to call the patient wife to advise to schedule an OV w/ the provider.

## 2018-02-17 NOTE — Telephone Encounter (Addendum)
Left message on vm per dpr informing pt I have printed an office note, per Dr. Darnell Level that may help with his CPAP replacement.  Also, asked pt to call back letting us know if he wants to pick it up or if he has the name of someone at [facility] and a fax number we could send it to for him.   [Office note is printed and in basket on [employer]

## 2018-02-17 NOTE — Telephone Encounter (Signed)
See TE, 02/17/18.

## 2018-02-21 ENCOUNTER — Ambulatory Visit (INDEPENDENT_AMBULATORY_CARE_PROVIDER_SITE_OTHER): Payer: Medicare Other | Admitting: Family Medicine

## 2018-02-21 ENCOUNTER — Encounter: Payer: Self-pay | Admitting: Family Medicine

## 2018-02-21 VITALS — BP 120/70 | HR 69 | Temp 97.8°F | Ht 70.0 in | Wt 340.2 lb

## 2018-02-21 DIAGNOSIS — G4733 Obstructive sleep apnea (adult) (pediatric): Secondary | ICD-10-CM | POA: Diagnosis not present

## 2018-02-21 DIAGNOSIS — J42 Unspecified chronic bronchitis: Secondary | ICD-10-CM

## 2018-02-21 DIAGNOSIS — R7303 Prediabetes: Secondary | ICD-10-CM

## 2018-02-21 DIAGNOSIS — J209 Acute bronchitis, unspecified: Secondary | ICD-10-CM

## 2018-02-21 MED ORDER — HYDROCOD POLST-CPM POLST ER 10-8 MG/5ML PO SUER: 5.0000 mL | Freq: Two times a day (BID) | ORAL | 0 refills | Status: DC | PRN

## 2018-02-21 MED ORDER — VITAMIN D3 1.25 MG (50000 UT) PO CAPS: 50000.0000 [IU] | ORAL_CAPSULE | ORAL | 1 refills | Status: DC

## 2018-02-21 MED ORDER — DOXYCYCLINE HYCLATE 100 MG PO TABS: 100.0000 mg | ORAL_TABLET | Freq: Two times a day (BID) | ORAL | 0 refills | Status: DC

## 2018-02-21 NOTE — Progress Notes (Signed)
BP 120/70 (BP Location: Left Arm, Patient Position: Sitting, Cuff Size: Large)   Pulse 69   Temp 97.8 F (36.6 C) (Oral)   Ht 5\' 10"  (1.778 m)   Wt (!) 340 lb 4 oz (154.3 kg)   SpO2 94%   BMI 48.82 kg/m    CC: CPAP check Subjective:    Patient ID: William Hebert, male    DOB: [birth year], 72 y.o.   MRN: [id number]  HPI: William Hebert is a 72 y.o. male presenting on 02/21/2018 for CPAP (Here for f/u to get new CPAP.  Needs new rx and note faxed to [facility] stating he was seen for CPAP. Pt is accompanied by wife.); Cough (Was seen about 2 wks ago. Says cough is better seems deeper in chest. States he has "violent" coughing episodes, sometimes close to passing out. ); and Lab request (Pt requests annual labs, especially vit D.)   Seen 10 days ago with acute bronchitis. Treated with zpack, prednisone course, and tussionex pennkinetic - most effective cough medicine for him. Cough is looser, cough has decreased. Stays dyspneic, having coughing fits. Friday night returned to work - elevated blood pressure. CXR without PNA. Denies fevers/chills, chest pain. Continues albuterol inhaler. Using fisherman's friend cough drop.    OSA on CPAP - Dr Einar Gip cardiology managed OSA prior. No recent evaluation. Prior sleep study . CPAP supplies through [facility]. Continues CPAP use - tolerating well and it is medically necessary to control his OSA. "Engine has outlived it's life expectancy".   COPD - did not start spiriva.   Relevant past medical, surgical, family and social history reviewed and updated as indicated. Interim medical history since our last visit reviewed. Allergies and medications reviewed and updated. Outpatient Medications Prior to Visit  Medication Sig Dispense Refill  . albuterol (PROVENTIL HFA;VENTOLIN HFA) 108 (90 Base) MCG/ACT inhaler Inhale 1-2 puffs into the lungs every 4 (four) hours as needed for wheezing or shortness of breath.    . Ascorbic Acid (VITAMIN C PO) Take 1,000 mg by  mouth daily.    [name] aspirin 81 MG chewable tablet Chew daily by mouth.    . benzonatate (TESSALON) 200 MG capsule Take 1 capsule (200 mg total) by mouth 3 (three) times daily as needed. Do not bite pill 30 capsule 1  . furosemide (LASIX) 40 MG tablet TAKE 1 TABLET BY MOUTH  DAILY 90 tablet 0  . labetalol (NORMODYNE) 200 MG tablet TAKE 1 TABLET DAILY BY  MOUTH. 90 tablet 1  . Multiple Vitamins-Minerals (MULTIVITAMIN ADULT PO) Take 1 tablet by mouth daily. MEN'S 50+ MULTIVITAMIN.    [name] NIFEdipine (PROCARDIA XL/ADALAT-CC) 60 MG 24 hr tablet TAKE 1 TABLET BY MOUTH  DAILY 90 tablet 0  . omeprazole (PRILOSEC) 20 MG capsule TAKE 1 CAPSULE BY MOUTH TWICE A DAY 180 capsule 0  . potassium chloride SA (K-DUR,KLOR-CON) 20 MEQ tablet TAKE 1 TABLET BY MOUTH  DAILY 90 tablet 0  . pravastatin (PRAVACHOL) 80 MG tablet Take 1 by mouth daily at night. NEEDS OFFICE VISIT 90 tablet 0  . Probiotic Product (PROBIOTIC PO) Take 1 tablet by mouth daily.    . Turmeric 500 MG TABS Take 1 tablet by mouth daily.    . Zinc 100 MG TABS Take 1 tablet by mouth daily.    . chlorpheniramine-HYDROcodone (TUSSIONEX PENNKINETIC ER) 10-8 MG/5ML SUER Take 5 mLs by mouth every 12 (twelve) hours as needed for cough. Caution of sedation 80 mL 0  . Cholecalciferol (  VITAMIN D3) 50000 units CAPS Take 50,000 Units by mouth every Friday. IN THE MORNING.  1  . Cholecalciferol (VITAMIN D3) 50000 units CAPS TAKE 1 CAPSULE BY MOUTH ONE TIME PER WEEK 12 capsule 1  . HYDROcodone-homatropine (HYCODAN) 5-1.5 MG/5ML syrup Take 5 mLs by mouth every 8 (eight) hours as needed for cough. 120 mL 0  . azithromycin (ZITHROMAX Z-PAK) 250 MG tablet Take 2 pills by mouth today and then 1 pill daily for 4 days 6 tablet 0  . predniSONE (DELTASONE) 10 MG tablet Take 3 pills once daily by mouth for 3 days, then 2 pills once daily for 3 days, then 1 pill once daily for 3 days and then stop 18 tablet 0   No facility-administered medications prior to visit.      Per HPI  unless specifically indicated in ROS section below Review of Systems     Objective:    BP 120/70 (BP Location: Left Arm, Patient Position: Sitting, Cuff Size: Large)   Pulse 69   Temp 97.8 F (36.6 C) (Oral)   Ht 5\' 10"  (1.778 m)   Wt (!) 340 lb 4 oz (154.3 kg)   SpO2 94%   BMI 48.82 kg/m   Wt Readings from Last 3 Encounters:  02/21/18 (!) 340 lb 4 oz (154.3 kg)  02/11/18 (!) 337 lb 12 oz (153.2 kg)  08/02/17 (!) 339 lb 1.6 oz (153.8 kg)    Physical Exam  Constitutional: He appears well-developed and well-nourished. No distress.  HENT:  Mouth/Throat: Oropharynx is clear and moist. No oropharyngeal exudate.  Cardiovascular: Normal rate, regular rhythm and normal heart sounds.  No murmur heard. Pulmonary/Chest: Effort normal. No respiratory distress. He has decreased breath sounds. He has no wheezes. He has no rhonchi. He has no rales.  Coughing fits present Coarse but clear  Musculoskeletal: He exhibits no edema.  Nursing note and vitals reviewed.  Results for orders placed or performed during the hospital encounter of 40/98/11  Basic metabolic panel  Result Value Ref Range   Sodium 141 135 - 145 mmol/L   Potassium 3.3 (L) 3.5 - 5.1 mmol/L   Chloride 103 101 - 111 mmol/L   CO2 27 22 - 32 mmol/L   Glucose, Bld 142 (H) 65 - 99 mg/dL   BUN 19 6 - 20 mg/dL   Creatinine, Ser 1.24 0.61 - 1.24 mg/dL   Calcium 9.3 8.9 - 10.3 mg/dL   GFR calc non Af Amer 58 (L) >60 mL/min   GFR calc Af Amer >60 >60 mL/min   Anion gap 11 5 - 15      Assessment & Plan:   Problem List Items Addressed This Visit    Prediabetes    I have asked him to return for physical with labs      Obstructive sleep apnea    Prior eval by Dr Lavonia Drafts with sleep study 03/2011. Pt continues regular use of CPAP and gets benefit from this. This continues to be medically necessary to control his obstructive sleep apnea. Will fax today's note attn Barbaraann Rondo at [facility].       Chronic bronchitis ([place])    Presumed  COPD in ex smoker (quit 2007).  He never started spiriva - wife requests spiriva refilled and pt will consider starting this.  Consider updated spirometry once breathing back to baseline.      Acute exacerbation of chronic bronchitis ([place]) - Primary    No signs of bacterial pneumonia at this time - but I anticipate  he has ongoing COPD exacerbation - will treat with doxycycline and continued albuterol inhaler. He declined breathing treatment in office today.  Update if not improving with treatment, red flags to seek urgent care reviewed.       Relevant Medications   chlorpheniramine-HYDROcodone (TUSSIONEX PENNKINETIC ER) 10-8 MG/5ML SUER   Tiotropium Bromide Monohydrate (SPIRIVA RESPIMAT) 2.5 MCG/ACT AERS       Meds ordered this encounter  Medications  . Cholecalciferol (VITAMIN D3) 50000 units CAPS    Sig: Take 50,000 Units by mouth every Friday. IN THE MORNING.    Dispense:  12 capsule    Refill:  1  . doxycycline (VIBRA-TABS) 100 MG tablet    Sig: Take 1 tablet (100 mg total) by mouth 2 (two) times daily.    Dispense:  20 tablet    Refill:  0  . chlorpheniramine-HYDROcodone (TUSSIONEX PENNKINETIC ER) 10-8 MG/5ML SUER    Sig: Take 5 mLs by mouth every 12 (twelve) hours as needed for cough. Caution of sedation    Dispense:  120 mL    Refill:  0  . Tiotropium Bromide Monohydrate (SPIRIVA RESPIMAT) 2.5 MCG/ACT AERS    Sig: Inhale 2 puffs into the lungs daily.    Dispense:  4 g    Refill:  3   No orders of the defined types were placed in this encounter.   Follow up plan: No follow-ups on file.  Ria Bush, MD

## 2018-02-21 NOTE — Patient Instructions (Addendum)
I think you have persistent bronchitis.  Treat with doxycycline antibiotic.  tussionex refilled today.  Schedule fasting labs and then physical.  We will send today's note to [place] at [facility].

## 2018-02-22 MED ORDER — TIOTROPIUM BROMIDE MONOHYDRATE 2.5 MCG/ACT IN AERS: 2.0000 | INHALATION_SPRAY | Freq: Every day | RESPIRATORY_TRACT | 3 refills | Status: DC

## 2018-02-22 NOTE — Telephone Encounter (Signed)
plz fax yesterday's note to [facility] at [facility] for CPAP machine. Thank you.

## 2018-02-22 NOTE — Telephone Encounter (Signed)
Faxed 02/21/18 office note to Barbaraann Rondo with [facility] at [phone].

## 2018-02-22 NOTE — Assessment & Plan Note (Signed)
I have asked him to return for physical with labs

## 2018-02-22 NOTE — Assessment & Plan Note (Addendum)
No signs of bacterial pneumonia at this time - but I anticipate he has ongoing COPD exacerbation - will treat with doxycycline and continued albuterol inhaler. He declined breathing treatment in office today.  Update if not improving with treatment, red flags to seek urgent care reviewed.

## 2018-02-22 NOTE — Assessment & Plan Note (Addendum)
Presumed COPD in ex smoker (quit 2007).  He never started spiriva - wife requests spiriva refilled and pt will consider starting this.  Consider updated spirometry once breathing back to baseline.

## 2018-02-22 NOTE — Assessment & Plan Note (Signed)
Prior eval by Dr Lavonia Drafts with sleep study 03/2011. Pt continues regular use of CPAP and gets benefit from this. This continues to be medically necessary to control his obstructive sleep apnea. Will fax today's note attn Barbaraann Rondo at [facility].

## 2018-02-25 ENCOUNTER — Encounter: Payer: Self-pay | Admitting: Family Medicine

## 2018-02-25 ENCOUNTER — Ambulatory Visit (INDEPENDENT_AMBULATORY_CARE_PROVIDER_SITE_OTHER)
Admission: RE | Admit: 2018-02-25 | Discharge: 2018-02-25 | Disposition: A | Discharge status: Home / Self Care | Payer: Medicare Other | Source: Ambulatory Visit | Attending: Family Medicine | Admitting: Family Medicine

## 2018-02-25 ENCOUNTER — Ambulatory Visit (INDEPENDENT_AMBULATORY_CARE_PROVIDER_SITE_OTHER): Payer: Medicare Other | Admitting: Family Medicine

## 2018-02-25 VITALS — BP 130/76 | HR 70 | Temp 98.3°F | Ht 70.0 in | Wt 340.2 lb

## 2018-02-25 DIAGNOSIS — R059 Cough, unspecified: Secondary | ICD-10-CM

## 2018-02-25 DIAGNOSIS — J42 Unspecified chronic bronchitis: Secondary | ICD-10-CM | POA: Diagnosis not present

## 2018-02-25 DIAGNOSIS — R0601 Orthopnea: Secondary | ICD-10-CM

## 2018-02-25 DIAGNOSIS — R05 Cough: Secondary | ICD-10-CM

## 2018-02-25 NOTE — Patient Instructions (Addendum)
Increase lasix to 40mg  twice daily over the weekend (second dose early afternoon).  Labs today.  Ambulatory pulse ox today.  Repeat chest xray today.

## 2018-02-25 NOTE — Progress Notes (Signed)
BP 130/76 (BP Location: Left Arm, Patient Position: Sitting, Cuff Size: Large)   Pulse 70   Temp 98.3 F (36.8 C) (Oral)   Ht 5\' 10"  (1.778 m)   Wt (!) 340 lb 4 oz (154.3 kg)   SpO2 96% Comment: Ambulatory, started at 96% and stayed.  BMI 48.82 kg/m    CC: cough Subjective:    Patient ID: William Hebert, male    DOB: [birth year], 72 y.o.   MRN: [id number]  HPI: William Hebert is a 72 y.o. male presenting on 02/25/2018 for Cough (C/o cough is not getting better and worsening at night. Pt's wife is concerned about aortic surgery site. Pt is accompanied by wife. )   Persistent cough - see prior notes for details.  3rd visit this month for persistent cough.  Has been treated for presumed COPD exac (acute on chronic bronchitis) with 7d doxycycline course, zpack, rpt 10d doxy course started on Monday. Also received prednisone, tussionex pennkinetic, tessalon perls and cough drops. CXR without PNA 02/11/2018.  Persistent coughing fits, violent at night, keeps him awake associated with marked dyspnea.  Trouble laying flat.  Has remained out of work due to cough.   Relevant past medical, surgical, family and social history reviewed and updated as indicated. Interim medical history since our last visit reviewed. Allergies and medications reviewed and updated. Outpatient Medications Prior to Visit  Medication Sig Dispense Refill  . albuterol (PROVENTIL HFA;VENTOLIN HFA) 108 (90 Base) MCG/ACT inhaler Inhale 1-2 puffs into the lungs every 4 (four) hours as needed for wheezing or shortness of breath.    . Ascorbic Acid (VITAMIN C PO) Take 1,000 mg by mouth daily.    [name] aspirin 81 MG chewable tablet Chew daily by mouth.    . benzonatate (TESSALON) 200 MG capsule Take 1 capsule (200 mg total) by mouth 3 (three) times daily as needed. Do not bite pill 30 capsule 1  . chlorpheniramine-HYDROcodone (TUSSIONEX PENNKINETIC ER) 10-8 MG/5ML SUER Take 5 mLs by mouth every 12 (twelve) hours as needed for cough.  Caution of sedation 120 mL 0  . Cholecalciferol (VITAMIN D3) 50000 units CAPS Take 50,000 Units by mouth every Friday. IN THE MORNING. 12 capsule 1  . doxycycline (VIBRA-TABS) 100 MG tablet Take 1 tablet (100 mg total) by mouth 2 (two) times daily. 20 tablet 0  . furosemide (LASIX) 40 MG tablet TAKE 1 TABLET BY MOUTH  DAILY 90 tablet 0  . labetalol (NORMODYNE) 200 MG tablet TAKE 1 TABLET DAILY BY  MOUTH. 90 tablet 1  . Multiple Vitamins-Minerals (MULTIVITAMIN ADULT PO) Take 1 tablet by mouth daily. MEN'S 50+ MULTIVITAMIN.    [name] NIFEdipine (PROCARDIA XL/ADALAT-CC) 60 MG 24 hr tablet TAKE 1 TABLET BY MOUTH  DAILY 90 tablet 0  . omeprazole (PRILOSEC) 20 MG capsule TAKE 1 CAPSULE BY MOUTH TWICE A DAY 180 capsule 0  . potassium chloride SA (K-DUR,KLOR-CON) 20 MEQ tablet TAKE 1 TABLET BY MOUTH  DAILY 90 tablet 0  . pravastatin (PRAVACHOL) 80 MG tablet Take 1 by mouth daily at night. NEEDS OFFICE VISIT 90 tablet 0  . Probiotic Product (PROBIOTIC PO) Take 1 tablet by mouth daily.    . Tiotropium Bromide Monohydrate (SPIRIVA RESPIMAT) 2.5 MCG/ACT AERS Inhale 2 puffs into the lungs daily. 4 g 3  . Turmeric 500 MG TABS Take 1 tablet by mouth daily.    . Zinc 100 MG TABS Take 1 tablet by mouth daily.     No facility-administered medications prior  to visit.      Per HPI unless specifically indicated in ROS section below Review of Systems     Objective:    BP 130/76 (BP Location: Left Arm, Patient Position: Sitting, Cuff Size: Large)   Pulse 70   Temp 98.3 F (36.8 C) (Oral)   Ht 5\' 10"  (1.778 m)   Wt (!) 340 lb 4 oz (154.3 kg)   SpO2 96% Comment: Ambulatory, started at 96% and stayed.  BMI 48.82 kg/m   Wt Readings from Last 3 Encounters:  02/25/18 (!) 340 lb 4 oz (154.3 kg)  02/21/18 (!) 340 lb 4 oz (154.3 kg)  02/11/18 (!) 337 lb 12 oz (153.2 kg)    Physical Exam  Constitutional: He appears well-developed and well-nourished. No distress.  HENT:  Head: Normocephalic and atraumatic.    Mouth/Throat: Oropharynx is clear and moist. No oropharyngeal exudate.  Neck: Normal range of motion. No JVD present.  Cardiovascular: Normal rate, regular rhythm and normal heart sounds.  No murmur heard. Pulmonary/Chest: Effort normal and breath sounds normal. No respiratory distress. He has no wheezes. He has no rales.  Lungs largely clear  Musculoskeletal: He exhibits edema (marked nonpitting edema (chronic), tr pitting).  Nursing note and vitals reviewed.  Results for orders placed or performed in visit on 02/25/18  CBC with Differential/Platelet  Result Value Ref Range   WBC 8.1 3.8 - 10.8 Thousand/uL   RBC 4.82 4.20 - 5.80 Million/uL   Hemoglobin 14.6 13.2 - 17.1 g/dL   HCT 43.1 38.5 - 50.0 %   MCV 89.4 80.0 - 100.0 fL   MCH 30.3 27.0 - 33.0 pg   MCHC 33.9 32.0 - 36.0 g/dL   RDW 12.9 11.0 - 15.0 %   Platelets 200 140 - 400 Thousand/uL   MPV 10.0 7.5 - 12.5 fL   Neutro Abs 5,889 1,500 - 7,800 cells/uL   Lymphs Abs 1,385 850 - 3,900 cells/uL   WBC mixed population 591 200 - 950 cells/uL   Eosinophils Absolute 154 15 - 500 cells/uL   Basophils Absolute 81 0 - 200 cells/uL   Neutrophils Relative % 72.7 %   Total Lymphocyte 17.1 %   Monocytes Relative 7.3 %   Eosinophils Relative 1.9 %   Basophils Relative 1.0 %  Brain natriuretic peptide  Result Value Ref Range   Brain Natriuretic Peptide 30 <100 pg/mL  Basic metabolic panel  Result Value Ref Range   Glucose, Bld 153 (H) 65 - 99 mg/dL   BUN 16 7 - 25 mg/dL   Creat 1.41 (H) 0.70 - 1.18 mg/dL   BUN/Creatinine Ratio 11 6 - 22 (calc)   Sodium 142 135 - 146 mmol/L   Potassium 3.8 3.5 - 5.3 mmol/L   Chloride 104 98 - 110 mmol/L   CO2 27 20 - 32 mmol/L   Calcium 9.3 8.6 - 10.3 mg/dL   DG Chest 2 View CLINICAL DATA:  Bronchitis, cough.  EXAM: CHEST - 2 VIEW  COMPARISON:  Radiographs of June 14, 2017.  FINDINGS: Stable cardiomediastinal silhouette. No pneumothorax or pleural effusion is noted. Mild bibasilar  opacities are noted which may represent subsegmental atelectasis secondary to hypoinflation of the lungs. Bony thorax is unremarkable.  IMPRESSION: Hypoinflation of the lungs is noted with mild bibasilar subsegmental atelectasis.  Electronically Signed   By: Marijo Conception, M.D.   On: 02/14/2018 17:22      Assessment & Plan:   Problem List Items Addressed This Visit    Cough -  Primary    Cough persists despite several rounds of antibiotics and a course of prednisone, treating up to now as COPD exacerbation with bronchitis.  He has stated spiriva daily as well.  Normal ambulatory pulse ox today. ?other etiology - CHF - check BNP, CBC, BMP today. Update CXR. Double lasix over weekend- call on Monday with an update. Consider echo.      Relevant Orders   DG Chest 2 View   CBC with Differential/Platelet (Completed)   Brain natriuretic peptide (Completed)   Basic metabolic panel (Completed)   Chronic bronchitis ([place])    Carries this dx, needs updated spirometry.  Check CXR again today for poorly resolving cough.  See above.        Other Visit Diagnoses    Orthopnea       Relevant Orders   DG Chest 2 View   CBC with Differential/Platelet (Completed)   Brain natriuretic peptide (Completed)   Basic metabolic panel (Completed)       No orders of the defined types were placed in this encounter.  Orders Placed This Encounter  Procedures  . DG Chest 2 View    Standing Status:   Future    Number of Occurrences:   1    Standing Expiration Date:   04/28/2019    Order Specific Question:   Reason for Exam (SYMPTOM  OR DIAGNOSIS REQUIRED)    Answer:   persistent cough eval CHF    Order Specific Question:   Preferred imaging location?    Answer:   [facility]    Order Specific Question:   Radiology Contrast Protocol - do NOT remove file path    Answer:   [email or web address]  . CBC with Differential/Platelet  . Brain natriuretic  peptide  . Basic metabolic panel    Follow up plan: No follow-ups on file.  Ria Bush, MD

## 2018-02-26 ENCOUNTER — Encounter: Payer: Self-pay | Admitting: Family Medicine

## 2018-02-26 DIAGNOSIS — R0601 Orthopnea: Secondary | ICD-10-CM

## 2018-02-26 LAB — CBC WITH DIFFERENTIAL/PLATELET
Basophils Absolute: 81 cells/uL (ref 0–200)
Basophils Relative: 1 %
Eosinophils Absolute: 154 cells/uL (ref 15–500)
Eosinophils Relative: 1.9 %
HCT: 43.1 % (ref 38.5–50.0)
Hemoglobin: 14.6 g/dL (ref 13.2–17.1)
Lymphs Abs: 1385 cells/uL (ref 850–3900)
MCH: 30.3 pg (ref 27.0–33.0)
MCHC: 33.9 g/dL (ref 32.0–36.0)
MCV: 89.4 fL (ref 80.0–100.0)
MPV: 10 fL (ref 7.5–12.5)
Monocytes Relative: 7.3 %
Neutro Abs: 5889 cells/uL (ref 1500–7800)
Neutrophils Relative %: 72.7 %
Platelets: 200 10*3/uL (ref 140–400)
RBC: 4.82 10*6/uL (ref 4.20–5.80)
RDW: 12.9 % (ref 11.0–15.0)
Total Lymphocyte: 17.1 %
WBC mixed population: 591 cells/uL (ref 200–950)
WBC: 8.1 10*3/uL (ref 3.8–10.8)

## 2018-02-26 LAB — BASIC METABOLIC PANEL
BUN/Creatinine Ratio: 11 (calc) (ref 6–22)
BUN: 16 mg/dL (ref 7–25)
CO2: 27 mmol/L (ref 20–32)
Calcium: 9.3 mg/dL (ref 8.6–10.3)
Chloride: 104 mmol/L (ref 98–110)
Creat: 1.41 mg/dL — ABNORMAL HIGH (ref 0.70–1.18)
Glucose, Bld: 153 mg/dL — ABNORMAL HIGH (ref 65–99)
Potassium: 3.8 mmol/L (ref 3.5–5.3)
Sodium: 142 mmol/L (ref 135–146)

## 2018-02-26 LAB — BRAIN NATRIURETIC PEPTIDE: Brain Natriuretic Peptide: 30 pg/mL (ref <100)

## 2018-02-28 DIAGNOSIS — H43819 Vitreous degeneration, unspecified eye: Secondary | ICD-10-CM

## 2018-02-28 HISTORY — DX: Vitreous degeneration, unspecified eye: H43.819

## 2018-02-28 NOTE — Assessment & Plan Note (Signed)
Carries this dx, needs updated spirometry.  Check CXR again today for poorly resolving cough.  See above.

## 2018-02-28 NOTE — Assessment & Plan Note (Signed)
Cough persists despite several rounds of antibiotics and a course of prednisone, treating up to now as COPD exacerbation with bronchitis.  He has stated spiriva daily as well.  Normal ambulatory pulse ox today. ?other etiology - CHF - check BNP, CBC, BMP today. Update CXR. Double lasix over weekend- call on Monday with an update. Consider echo.

## 2018-03-04 ENCOUNTER — Telehealth: Payer: Self-pay | Admitting: Family Medicine

## 2018-03-04 DIAGNOSIS — H43399 Other vitreous opacities, unspecified eye: Secondary | ICD-10-CM

## 2018-03-04 NOTE — Telephone Encounter (Signed)
Sounds like floater - rec ophtho eval for this.  Will place referral.

## 2018-03-04 NOTE — Telephone Encounter (Signed)
Noted  

## 2018-03-04 NOTE — Telephone Encounter (Signed)
Copied from [place] [id number]. Topic: Inquiry >> Mar 04, 2018 10:15 AM Mylinda Latina, NT wrote: Reason for CRM: Patient wife  William Hebert called and states that the patient stated experiencing a black dot in his eye. She think it is his left eye but not sure.  The eye doctor cant see the patient. She is unsure if Dr. Danise Mina can check his eye or if they can be referred out to another eye doctor is necessary. Patient wife is requesting a call back . CB#

## 2018-03-04 NOTE — Telephone Encounter (Signed)
Appt already made with Dr George Ina on 03/10/18 patient wife notified.

## 2018-03-05 ENCOUNTER — Encounter: Payer: Self-pay | Admitting: Family Medicine

## 2018-03-08 MED ORDER — FUROSEMIDE 40 MG PO TABS: 40.0000 mg | ORAL_TABLET | Freq: Every day | ORAL | 0 refills | Status: DC

## 2018-03-11 ENCOUNTER — Encounter: Payer: Self-pay | Admitting: Family Medicine

## 2018-03-14 ENCOUNTER — Ambulatory Visit (INDEPENDENT_AMBULATORY_CARE_PROVIDER_SITE_OTHER): Payer: Medicare Other | Admitting: Family Medicine

## 2018-03-14 ENCOUNTER — Encounter (INDEPENDENT_AMBULATORY_CARE_PROVIDER_SITE_OTHER): Payer: Self-pay

## 2018-03-14 ENCOUNTER — Encounter: Payer: Self-pay | Admitting: Family Medicine

## 2018-03-14 VITALS — BP 154/82 | HR 79 | Temp 97.8°F | Ht 70.0 in | Wt 340.2 lb

## 2018-03-14 DIAGNOSIS — R05 Cough: Secondary | ICD-10-CM | POA: Diagnosis not present

## 2018-03-14 DIAGNOSIS — Z87891 Personal history of nicotine dependence: Secondary | ICD-10-CM | POA: Diagnosis not present

## 2018-03-14 DIAGNOSIS — Z6841 Body Mass Index (BMI) 40.0 and over, adult: Secondary | ICD-10-CM

## 2018-03-14 DIAGNOSIS — I10 Essential (primary) hypertension: Secondary | ICD-10-CM | POA: Diagnosis not present

## 2018-03-14 DIAGNOSIS — R059 Cough, unspecified: Secondary | ICD-10-CM

## 2018-03-14 DIAGNOSIS — G4733 Obstructive sleep apnea (adult) (pediatric): Secondary | ICD-10-CM | POA: Diagnosis not present

## 2018-03-14 MED ORDER — FUROSEMIDE 40 MG PO TABS: 40.0000 mg | ORAL_TABLET | Freq: Two times a day (BID) | ORAL | 1 refills | Status: DC

## 2018-03-14 MED ORDER — ALBUTEROL SULFATE (2.5 MG/3ML) 0.083% IN NEBU
2.5000 mg | INHALATION_SOLUTION | Freq: Once | RESPIRATORY_TRACT | Status: AC
  Administered 2018-03-14: 2.5 mg via RESPIRATORY_TRACT

## 2018-03-14 NOTE — Progress Notes (Signed)
BP (!) 154/82 (BP Location: Right Arm, Patient Position: Sitting, Cuff Size: Large)   Pulse 79   Temp 97.8 F (36.6 C) (Oral)   Ht 5\' 10"  (1.778 m)   Wt (!) 340 lb 4 oz (154.3 kg)   SpO2 94%   BMI 48.82 kg/m    CC: f/u cough, spirometry Subjective:    Patient ID: William Hebert, male    DOB: 09-16-1945, 72 y.o.   MRN: [id number]  HPI: William Hebert is a 72 y.o. male presenting on 03/14/2018 for Bronchitis (Here for f/u and spirometry. )   See prior note for details on prolonged cough - thought CHF exacerbation as best responded to increased lasix. Current lasix dose is 40mg  daily. BNP was normal. Planned echocardiogram tomorrow. Completed several courses of steroids and abx without benefit.  New CPAP machine - having difficulty using the machine. Uses partial facial mask.   Spirometry today: Normal spirometry. Post bronchodilator test not clearly improved. Pre: FVC 80%, FEV1 79%, ratio 0.72 Post: FVC 87%, FEV1 82%, ratio 0.69  Ex smoker, quit 2007. 80 PY hx. Eligible for lung cancer screening CT. Previously declined.   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 Referred to retinologist - planned f/u later this week.  Relevant past medical, surgical, family and social history reviewed and updated as indicated. Interim medical history since our last visit reviewed. Allergies and medications reviewed and updated. Outpatient Medications Prior to Visit  Medication Sig Dispense Refill  . albuterol (PROVENTIL HFA;VENTOLIN HFA) 108 (90 Base) MCG/ACT inhaler Inhale 1-2 puffs into the lungs every 4 (four) hours as needed for wheezing or shortness of breath.    . Ascorbic Acid (VITAMIN C PO) Take 1,000 mg by mouth daily.    [name] aspirin 81 MG chewable tablet Chew daily by mouth.    . benzonatate (TESSALON) 200 MG capsule Take 1 capsule (200 mg total) by mouth 3 (three) times daily as needed. Do not bite pill 30 capsule 1  . Cholecalciferol (VITAMIN D3) 50000 units  CAPS Take 50,000 Units by mouth every Friday. IN THE MORNING. 12 capsule 1  . labetalol (NORMODYNE) 200 MG tablet TAKE 1 TABLET DAILY BY  MOUTH. 90 tablet 1  . Multiple Vitamins-Minerals (MULTIVITAMIN ADULT PO) Take 1 tablet by mouth daily. MEN'S 50+ MULTIVITAMIN.    [name] NIFEdipine (PROCARDIA XL/ADALAT-CC) 60 MG 24 hr tablet TAKE 1 TABLET BY MOUTH  DAILY 90 tablet 0  . omeprazole (PRILOSEC) 20 MG capsule Take 1 capsule (20 mg total) by mouth daily. TAKE 1 CAPSULE BY MOUTH TWICE A DAY    . potassium chloride SA (K-DUR,KLOR-CON) 20 MEQ tablet TAKE 1 TABLET BY MOUTH  DAILY 90 tablet 0  . pravastatin (PRAVACHOL) 80 MG tablet Take 1 by mouth daily at night. NEEDS OFFICE VISIT 90 tablet 0  . Turmeric 500 MG TABS Take 1 tablet by mouth daily.    . Zinc 100 MG TABS Take 1 tablet by mouth daily.    . furosemide (LASIX) 40 MG tablet Take 1 tablet (40 mg total) by mouth daily. Take extra tablet early afternoon as needed for leg swelling 110 tablet 0  . omeprazole (PRILOSEC) 20 MG capsule TAKE 1 CAPSULE BY MOUTH TWICE A DAY 180 capsule 0  . chlorpheniramine-HYDROcodone (TUSSIONEX PENNKINETIC ER) 10-8 MG/5ML SUER Take 5 mLs by mouth every 12 (twelve) hours as needed for cough. Caution of sedation 120 mL 0  . doxycycline (VIBRA-TABS) 100 MG tablet Take 1 tablet (  100 mg total) by mouth 2 (two) times daily. 20 tablet 0  . Probiotic Product (PROBIOTIC PO) Take 1 tablet by mouth daily.    . Tiotropium Bromide Monohydrate (SPIRIVA RESPIMAT) 2.5 MCG/ACT AERS Inhale 2 puffs into the lungs daily. (Patient not taking: Reported on 03/14/2018) 4 g 3   No facility-administered medications prior to visit.      Per HPI unless specifically indicated in ROS section below Review of Systems     Objective:    BP (!) 154/82 (BP Location: Right Arm, Patient Position: Sitting, Cuff Size: Large)   Pulse 79   Temp 97.8 F (36.6 C) (Oral)   Ht 5\' 10"  (1.778 m)   Wt (!) 340 lb 4 oz (154.3 kg)   SpO2 94%   BMI 48.82 kg/m   Wt  Readings from Last 3 Encounters:  03/14/18 (!) 340 lb 4 oz (154.3 kg)  02/25/18 (!) 340 lb 4 oz (154.3 kg)  02/21/18 (!) 340 lb 4 oz (154.3 kg)    Physical Exam  Constitutional: He appears well-developed and well-nourished. No distress.  HENT:  Mouth/Throat: Oropharynx is clear and moist. No oropharyngeal exudate.  Cardiovascular: Normal rate, regular rhythm and normal heart sounds.  No murmur heard. Pulmonary/Chest: Effort normal and breath sounds normal. No respiratory distress. He has no wheezes. He has no rales.  Musculoskeletal: He exhibits no edema.  Skin: Skin is warm and dry. No rash noted.  Psychiatric: He has a normal mood and affect.  Nursing note and vitals reviewed.  Results for orders placed or performed in visit on 02/25/18  CBC with Differential/Platelet  Result Value Ref Range   WBC 8.1 3.8 - 10.8 Thousand/uL   RBC 4.82 4.20 - 5.80 Million/uL   Hemoglobin 14.6 13.2 - 17.1 g/dL   HCT 43.1 38.5 - 50.0 %   MCV 89.4 80.0 - 100.0 fL   MCH 30.3 27.0 - 33.0 pg   MCHC 33.9 32.0 - 36.0 g/dL   RDW 12.9 11.0 - 15.0 %   Platelets 200 140 - 400 Thousand/uL   MPV 10.0 7.5 - 12.5 fL   Neutro Abs 5,889 1,500 - 7,800 cells/uL   Lymphs Abs 1,385 850 - 3,900 cells/uL   WBC mixed population 591 200 - 950 cells/uL   Eosinophils Absolute 154 15 - 500 cells/uL   Basophils Absolute 81 0 - 200 cells/uL   Neutrophils Relative % 72.7 %   Total Lymphocyte 17.1 %   Monocytes Relative 7.3 %   Eosinophils Relative 1.9 %   Basophils Relative 1.0 %  Brain natriuretic peptide  Result Value Ref Range   Brain Natriuretic Peptide 30 <100 pg/mL  Basic metabolic panel  Result Value Ref Range   Glucose, Bld 153 (H) 65 - 99 mg/dL   BUN 16 7 - 25 mg/dL   Creat 1.41 (H) 0.70 - 1.18 mg/dL   BUN/Creatinine Ratio 11 6 - 22 (calc)   Sodium 142 135 - 146 mmol/L   Potassium 3.8 3.5 - 5.3 mmol/L   Chloride 104 98 - 110 mmol/L   CO2 27 20 - 32 mmol/L   Calcium 9.3 8.6 - 10.3 mg/dL        Assessment & Plan:   Problem List Items Addressed This Visit    Obstructive sleep apnea    Struggling with new mask fit - using ResMed nasal pillow.  Discussed return to cards or pulm if ongoing trouble.  He continues use of CPAP and benefits from this. It continues to  be medically necessary to control his OSA.       Hypertension    bp elevated today - only took meds 1 hr ago.  Will increase lasix to 40mg  bid.       Relevant Medications   furosemide (LASIX) 40 MG tablet   Ex-smoker   Relevant Orders   PR EVAL OF BRONCHOSPASM (Completed)   Cough - Primary    Recent spirometry reassuring. Anticipate cardiac cause of cough - likely diastolic CHF - pending echo tomorrow. Check labs on higher lasix dose in 1 wk Endorsing difficulty with any solids or liquids - will rec add zantac nightly in addition to omeprazole 20mg  daily.      Relevant Medications   albuterol (PROVENTIL) (2.5 MG/3ML) 0.083% nebulizer solution 2.5 mg (Completed)   Other Relevant Orders   PR EVAL OF BRONCHOSPASM (Completed)   Body mass index (BMI) of 45.0 to 49.9 in adult [facility])    Encouraged regular activity for goal weight loss, discussed relation of obesity to heart and lung health.           Meds ordered this encounter  Medications  . albuterol (PROVENTIL) (2.5 MG/3ML) 0.083% nebulizer solution 2.5 mg  . furosemide (LASIX) 40 MG tablet    Sig: Take 1 tablet (40 mg total) by mouth 2 (two) times daily.    Dispense:  180 tablet    Refill:  1   Orders Placed This Encounter  Procedures  . PR EVAL OF BRONCHOSPASM    Follow up plan: No follow-ups on file.  Ria Bush, MD

## 2018-03-14 NOTE — Patient Instructions (Addendum)
New lasix dose will be 40mg  twice daily. Return in 1 week after higher dose for labs. Start zantac 150mg  at night time.  We will be in touch with ultrasound results.

## 2018-03-14 NOTE — Assessment & Plan Note (Signed)
bp elevated today - only took meds 1 hr ago.  Will increase lasix to 40mg  bid.

## 2018-03-14 NOTE — Assessment & Plan Note (Signed)
Encouraged regular activity for goal weight loss, discussed relation of obesity to heart and lung health.

## 2018-03-14 NOTE — Assessment & Plan Note (Addendum)
Recent spirometry reassuring. Anticipate cardiac cause of cough - likely diastolic CHF - pending echo tomorrow. Check labs on higher lasix dose in 1 wk Endorsing difficulty with any solids or liquids - will rec add zantac nightly in addition to omeprazole 20mg  daily.

## 2018-03-14 NOTE — Assessment & Plan Note (Addendum)
Struggling with new mask fit - using ResMed nasal pillow.  Discussed return to cards or pulm if ongoing trouble.  He continues use of CPAP and benefits from this. It continues to be medically necessary to control his OSA.

## 2018-03-15 ENCOUNTER — Ambulatory Visit (INDEPENDENT_AMBULATORY_CARE_PROVIDER_SITE_OTHER): Payer: Medicare Other

## 2018-03-15 ENCOUNTER — Other Ambulatory Visit: Payer: Self-pay | Admitting: Family Medicine

## 2018-03-15 ENCOUNTER — Other Ambulatory Visit: Payer: Self-pay

## 2018-03-15 DIAGNOSIS — R0601 Orthopnea: Secondary | ICD-10-CM | POA: Diagnosis not present

## 2018-03-16 ENCOUNTER — Other Ambulatory Visit: Payer: Self-pay | Admitting: Family Medicine

## 2018-03-16 ENCOUNTER — Ambulatory Visit (INDEPENDENT_AMBULATORY_CARE_PROVIDER_SITE_OTHER): Payer: Medicare Other

## 2018-03-16 VITALS — BP 140/86 | HR 70 | Temp 98.0°F | Ht 71.5 in | Wt 339.0 lb

## 2018-03-16 DIAGNOSIS — R7303 Prediabetes: Secondary | ICD-10-CM

## 2018-03-16 DIAGNOSIS — R059 Cough, unspecified: Secondary | ICD-10-CM

## 2018-03-16 DIAGNOSIS — E559 Vitamin D deficiency, unspecified: Secondary | ICD-10-CM

## 2018-03-16 DIAGNOSIS — Z0000 Encounter for general adult medical examination without abnormal findings: Secondary | ICD-10-CM | POA: Diagnosis not present

## 2018-03-16 DIAGNOSIS — E785 Hyperlipidemia, unspecified: Secondary | ICD-10-CM

## 2018-03-16 DIAGNOSIS — R05 Cough: Secondary | ICD-10-CM

## 2018-03-16 DIAGNOSIS — Z125 Encounter for screening for malignant neoplasm of prostate: Secondary | ICD-10-CM

## 2018-03-16 LAB — COMPREHENSIVE METABOLIC PANEL
ALT: 24 U/L (ref 0–53)
AST: 20 U/L (ref 0–37)
Albumin: 3.8 g/dL (ref 3.5–5.2)
Alkaline Phosphatase: 64 U/L (ref 39–117)
BUN: 17 mg/dL (ref 6–23)
CO2: 30 mEq/L (ref 19–32)
Calcium: 9.3 mg/dL (ref 8.4–10.5)
Chloride: 100 mEq/L (ref 96–112)
Creatinine, Ser: 1.22 mg/dL (ref 0.40–1.50)
GFR: 62.34 mL/min (ref >60.00)
Glucose, Bld: 151 mg/dL — ABNORMAL HIGH (ref 70–99)
Potassium: 3.3 mEq/L — ABNORMAL LOW (ref 3.5–5.1)
Sodium: 140 mEq/L (ref 135–145)
Total Bilirubin: 0.5 mg/dL (ref 0.2–1.2)
Total Protein: 6.1 g/dL (ref 6.0–8.3)

## 2018-03-16 LAB — PSA: PSA: 0.68 ng/mL (ref 0.10–4.00)

## 2018-03-16 LAB — LIPID PANEL
Cholesterol: 128 mg/dL (ref 0–200)
HDL: 30.8 mg/dL — ABNORMAL LOW (ref >39.00)
NonHDL: 97.03
Total CHOL/HDL Ratio: 4
Triglycerides: 260 mg/dL — ABNORMAL HIGH (ref 0.0–149.0)
VLDL: 52 mg/dL — ABNORMAL HIGH (ref 0.0–40.0)

## 2018-03-16 LAB — LDL CHOLESTEROL, DIRECT: Direct LDL: 66 mg/dL

## 2018-03-16 LAB — TSH: TSH: 3.01 u[IU]/mL (ref 0.35–4.50)

## 2018-03-16 LAB — VITAMIN D 25 HYDROXY (VIT D DEFICIENCY, FRACTURES): VITD: 42.26 ng/mL (ref 30.00–100.00)

## 2018-03-16 LAB — HEMOGLOBIN A1C: Hgb A1c MFr Bld: 6.9 % — ABNORMAL HIGH (ref 4.6–6.5)

## 2018-03-16 NOTE — Progress Notes (Signed)
Subjective:   William Hebert is a 72 y.o. male who presents for Medicare Annual/Subsequent preventive examination.  Review of Systems:  N/A Cardiac Risk Factors include: advanced age (>56men, >72 women);male gender;obesity (BMI >30kg/m2);hypertension;dyslipidemia     Objective:    Vitals: BP 140/86 (BP Location: Right Arm, Patient Position: Sitting, Cuff Size: Large)   Pulse 70   Temp 98 F (36.7 C) (Oral)   Ht 5' 11.5" (1.816 m) Comment: shoes  Wt (!) 339 lb (153.8 kg)   SpO2 94%   BMI 46.62 kg/m   Body mass index is 46.62 kg/m.  Advanced Directives 03/16/2018 07/08/2017 07/06/2017 02/23/2017 02/19/2017 11/25/2016 07/16/2015  Does Patient Have a Medical Advance Directive? No No No No No No No  Does patient want to make changes to medical advance directive? - No - Patient declined - - - - -  Would patient like information on creating a medical advance directive? No - Patient declined No - Patient declined No - Patient declined No - Patient declined No - Patient declined - No - patient declined information    Tobacco Social History   Tobacco Use  Smoking Status Former Smoker  . Packs/day: 2.00  . Years: 40.00  . Pack years: 80.00  . Types: Cigarettes  . Last attempt to quit: 08/31/2005  . Years since quitting: 12.5  Smokeless Tobacco Former Systems developer  . Types: Snuff, Chew  . Quit date: 07/06/2006     Counseling given: No   Clinical Intake:  Pre-visit preparation completed: Yes  Pain : 0-10 Pain Score: 2  Pain Type: Chronic pain Pain Location: Back Pain Orientation: Lower Pain Onset: More than a month ago Pain Frequency: Intermittent     Nutritional Status: BMI > 30  Obese Nutritional Risks: None  How often do you need to have someone help you when you read instructions, pamphlets, or other written materials from your doctor or pharmacy?: 1 - Never What is the last grade level you completed in school?: 12th grade + some college  Interpreter Needed?:  No  Comments: pt lives with spouse Information entered by :: LPinson, LPN  Past Medical History:  Diagnosis Date  . AAA (abdominal aortic aneurysm) without rupture ([place]) 07/10/2014   S/p endovascular repair 2012 with stent Kellie Simmering), yearly f/u  . Arthritis   . Cancer ([place])    skin cancer non melanioma  . Chronic bronchitis ([place]) 11/2016 AND 10-2016  . COPD (chronic obstructive pulmonary disease) ([facility])    chronic bronchitis  . GERD (gastroesophageal reflux disease)   . Headache(784.0)    in 20's  . Hepatitis    Carrier cant give blood gives a false positive  . Hernia of abdominal wall    LARGE FROM BREAST BONE AREA TO BELLY BUTTON  . History of skin cancer 2014  . HTN (hypertension)   . Hyperlipidemia   . Hypogonadism in male    prior on testosterone cream  . Morbidly obese ([place])   . Pneumonia   . PONV (postoperative nausea and vomiting)    PONV AFTER AAA REPAIR, ALL OTHER SURGERIES WENT OK  . Posterior vitreous detachment 02/2018  . Shortness of breath    WITH EXERTION  . Sleep apnea    CPAP    Past Surgical History:  Procedure Laterality Date  . ABDOMINAL AORTIC ANEURYSM REPAIR  05/20/2011   aorto bifem. stent graft Kellie Simmering)  . CATARACT EXTRACTION Bilateral 2014   X 2 BOTH EYES DONE TWICE  . COLONOSCOPY WITH PROPOFOL N/A  02/23/2017   4 TA polyps, rpt 3-5 yrs ([place])  . ELBOW SURGERY  2012  . FOOT TENDON SURGERY Right   . KNEE SURGERY Right remote   arthroscopy x2  . MASS EXCISION N/A 05/10/2013   Procedure: EXCISION back MASS - cyst Marcello Moores A. Cornett, MD)  . SHOULDER OPEN ROTATOR CUFF REPAIR Right 07/08/2017   Procedure: Right shoulder mini open rotator cuff repair;  Surgeon: Susa Day, MD;  Location: [facility];  Service: Orthopedics;  Laterality: Right;  90 mins; Interscalene Block  . SKIN SURGERY     2 different surgeries face   . UMBILICAL HERNIA REPAIR  2002   Family History  Problem Relation Age of Onset  . Cancer Mother 7       throat (nonsmoker)  .  Heart disease Maternal Aunt   . Alcohol abuse Father        deceased 77yo   Social History   Socioeconomic History  . Marital status: Married    Spouse name: Not on file  . Number of children: Not on file  . Years of education: Not on file  . Highest education level: Not on file  Occupational History  . Occupation: Air traffic controller.   Social Needs  . Financial resource strain: Not on file  . Food insecurity:    Worry: Not on file    Inability: Not on file  . Transportation needs:    Medical: Not on file    Non-medical: Not on file  Tobacco Use  . Smoking status: Former Smoker    Packs/day: 2.00    Years: 40.00    Pack years: 80.00    Types: Cigarettes    Last attempt to quit: 08/31/2005    Years since quitting: 12.5  . Smokeless tobacco: Former Systems developer    Types: Snuff, Sarina Ser    Quit date: 07/06/2006  Substance and Sexual Activity  . Alcohol use: Yes    Alcohol/week: 0.0 oz    Comment: rarely  . Drug use: No  . Sexual activity: Yes    Partners: Female  Lifestyle  . Physical activity:    Days per week: Not on file    Minutes per session: Not on file  . Stress: Not on file  Relationships  . Social connections:    Talks on phone: Not on file    Gets together: Not on file    Attends religious service: Not on file    Active member of club or organization: Not on file    Attends meetings of clubs or organizations: Not on file    Relationship status: Not on file  Other Topics Concern  . 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 albuterol (PROVENTIL HFA;VENTOLIN HFA) 108 (90 Base) MCG/ACT inhaler Inhale 1-2 puffs into the lungs every 4 (four) hours as needed for wheezing or shortness of breath.  . Ascorbic Acid  (VITAMIN C PO) Take 1,000 mg by mouth daily.  [name] aspirin 81 MG chewable tablet Chew daily by mouth.  . benzonatate (TESSALON) 200 MG capsule Take 1 capsule (200 mg total) by mouth 3 (three) times daily as needed. Do not bite pill  . Cholecalciferol (VITAMIN D3) 50000 units CAPS Take 50,000  Units by mouth every Friday. IN THE MORNING.  . furosemide (LASIX) 40 MG tablet Take 1 tablet (40 mg total) by mouth 2 (two) times daily.  [name] labetalol (NORMODYNE) 200 MG tablet TAKE 1 TABLET DAILY BY  MOUTH.  . Multiple Vitamins-Minerals (MULTIVITAMIN ADULT PO) Take 1 tablet by mouth daily. MEN'S 50+ MULTIVITAMIN.  [name] NIFEdipine (PROCARDIA XL/ADALAT-CC) 60 MG 24 hr tablet TAKE 1 TABLET BY MOUTH  DAILY  . omeprazole (PRILOSEC) 20 MG capsule Take 1 capsule (20 mg total) by mouth daily. TAKE 1 CAPSULE BY MOUTH TWICE A DAY  . potassium chloride SA (K-DUR,KLOR-CON) 20 MEQ tablet TAKE 1 TABLET BY MOUTH  DAILY  . pravastatin (PRAVACHOL) 80 MG tablet TAKE 1 TABLET BY MOUTH  DAILY AT NIGHT.  . Turmeric 500 MG TABS Take 1 tablet by mouth daily.  . Zinc 100 MG TABS Take 1 tablet by mouth daily.  . [DISCONTINUED] labetalol (NORMODYNE) 200 MG tablet TAKE 1 TABLET DAILY BY  MOUTH.   No facility-administered encounter medications on file as of 03/16/2018.     Activities of Daily Living In your present state of health, do you have any difficulty performing the following activities: 03/16/2018 07/08/2017  Hearing? N -  Vision? N N  Comment - -  Difficulty concentrating or making decisions? N N  Walking or climbing stairs? Y Y  Comment - -  Dressing or bathing? N N  Doing errands, shopping? N -  Preparing Food and eating ? N -  Using the Toilet? N -  In the past six months, have you accidently leaked urine? N -  Do you have problems with loss of bowel control? N -  Managing your Medications? N -  Managing your Finances? N -  Housekeeping or managing your Housekeeping? N -  Some recent data might be hidden    Patient  Care Team: Ria Bush, MD as PCP - General (Family Medicine) Adrian Prows, MD (Cardiology)   Assessment:   This is a routine wellness examination for [place].   Hearing Screening   125Hz  250Hz  500Hz  1000Hz  2000Hz  3000Hz  4000Hz  6000Hz  8000Hz   Right ear:   40 40 40  40    Left ear:   40 40 40  40    Vision Screening Comments: Vision exam in Jul 2019 with Dr. Ronnald Ramp     Exercise Activities and Dietary recommendations Current Exercise Habits: Home exercise routine, Type of exercise: Other - see comments(swimming), Time (Minutes): 60, Frequency (Times/Week): 7, Weekly Exercise (Minutes/Week): 420, Intensity: Moderate, Exercise limited by: orthopedic condition(s)  Goals    . Increase physical activity     Weather permitting, I will continue to swim for 60 minutes daily.        Fall Risk Fall Risk  03/16/2018 11/25/2016 01/23/2016 09/10/2015 02/12/2015  Falls in the past year? No No No No No   Depression Screen PHQ 2/9 Scores 03/16/2018 11/25/2016 01/23/2016 09/10/2015  PHQ - 2 Score 0 0 0 0  PHQ- 9 Score 0 - - -    Cognitive Function MMSE - Mini Mental State Exam 03/16/2018 11/25/2016  Orientation to time 5 5  Orientation to Place 5 5  Registration 3 3  Attention/ Calculation 0 0  Recall 3 3  Language- name 2 objects 0 0  Language- repeat 1 1  Language- follow 3 step command 3 3  Language- read & follow direction 0 0  Write a sentence 0 0  Copy design 0 0  Total score 20 20  PLEASE NOTE: A Mini-Cog screen was completed. Maximum score is 20. A value of 0 denotes this part of [place] MMSE was not completed or the patient failed this part of the Mini-Cog screening.   Mini-Cog Screening Orientation to Time - Max 5 pts Orientation to Place - Max 5 pts Registration - Max 3 pts Recall - Max 3 pts Language Repeat - Max 1 pts Language Follow 3 Step Command - Max 3 pts     Immunization History  Administered Date(s) Administered  . Pneumococcal Conjugate-13 03/06/2014  .  Pneumococcal Polysaccharide-23 09/10/2015  . Td 03/06/2014    Screening Tests Health Maintenance  Topic Date Due  . DTaP/Tdap/Td (1 - Tdap) 03/06/2024 (Originally 03/07/2014)  . INFLUENZA VACCINE  03/31/2018  . COLONOSCOPY  02/23/2022  . TETANUS/TDAP  03/06/2024  . Hepatitis C Screening  Completed  . PNA vac Low Risk Adult  Completed      Plan:     I have personally reviewed, addressed, and noted the following in the patient's chart:  A. Medical and social history B. Use of alcohol, tobacco or illicit drugs  C. Current medications and supplements D. Functional ability and status E.  Nutritional status F.  Physical activity G. Advance directives H. List of other physicians I.  Hospitalizations, surgeries, and ER visits in previous 12 months J.  [place] to include hearing, vision, cognitive, depression L. Referrals and appointments - none  In addition, I have reviewed and discussed with patient certain preventive protocols, quality metrics, and best practice recommendations. A written personalized care plan for preventive services as well as general preventive health recommendations were provided to patient.  See attached scanned questionnaire for additional information.   Signed,   Lindell Noe, MHA, BS, LPN Health Coach

## 2018-03-16 NOTE — Patient Instructions (Signed)
William Hebert , Thank you for taking time to come for your Medicare Wellness Visit. I appreciate your ongoing commitment to your health goals. Please review the following plan we discussed and let me know if I can assist you in the future.   These are the goals we discussed: Goals    . Increase physical activity     Weather permitting, I will continue to swim for 60 minutes daily.        This is a list of the screening recommended for you and due dates:  Health Maintenance  Topic Date Due  . DTaP/Tdap/Td vaccine (1 - Tdap) 03/06/2024*  . Flu Shot  03/31/2018  . Colon Cancer Screening  02/23/2022  . Tetanus Vaccine  03/06/2024  .  Hepatitis C: One time screening is recommended by Center for Disease Control  (CDC) for  adults born from [birth year] through 1963.   Completed  . Pneumonia vaccines  Completed  *Topic was postponed. The date shown is not the original due date.   Preventive Care for Adults  A healthy lifestyle and preventive care can promote health and wellness. Preventive health guidelines for adults include the following key practices.  . A routine yearly physical is a good way to check with your health care provider about your health and preventive screening. It is a chance to share any concerns and updates on your health and to receive a thorough exam.  . Visit your dentist for a routine exam and preventive care every 6 months. Brush your teeth twice a day and floss once a day. Good oral hygiene prevents tooth decay and gum disease.  . The frequency of eye exams is based on your age, health, family medical history, use  of contact lenses, and other factors. Follow your health care provider's recommendations for frequency of eye exams.  . Eat a healthy diet. Foods like vegetables, fruits, whole grains, low-fat dairy products, and lean protein foods contain the nutrients you need without too many calories. Decrease your intake of foods high in solid fats, added sugars, and salt.  Eat the right amount of calories for you. Get information about a proper diet from your health care provider, if necessary.  . Regular physical exercise is one of the most important things you can do for your health. Most adults should get at least 150 minutes of moderate-intensity exercise (any activity that increases your heart rate and causes you to sweat) each week. In addition, most adults need muscle-strengthening exercises on 2 or more days a week.  Silver Sneakers may be a benefit available to you. To determine eligibility, you may visit the website: www.silversneakers.com or contact program at 587-703-8342 Mon-Fri between 8AM-8PM.   . Maintain a healthy weight. The body mass index (BMI) is a screening tool to identify possible weight problems. It provides an estimate of body fat based on height and weight. Your health care provider can find your BMI and can help you achieve or maintain a healthy weight.   For adults 20 years and older: ? A BMI below 18.5 is considered underweight. ? A BMI of 18.5 to 24.9 is normal. ? A BMI of 25 to 29.9 is considered overweight. ? A BMI of 30 and above is considered obese.   . Maintain normal blood lipids and cholesterol levels by exercising and minimizing your intake of saturated fat. Eat a balanced diet with plenty of fruit and vegetables. Blood tests for lipids and cholesterol should begin at age 58 and be  repeated every 5 years. If your lipid or cholesterol levels are high, you are over 50, or you are at high risk for heart disease, you may need your cholesterol levels checked more frequently. Ongoing high lipid and cholesterol levels should be treated with medicines if diet and exercise are not working.  . If you smoke, find out from your health care provider how to quit. If you do not use tobacco, please do not start.  . If you choose to drink alcohol, please do not consume more than 2 drinks per day. One drink is considered to be 12 ounces (355  mL) of beer, 5 ounces (148 mL) of wine, or 1.5 ounces (44 mL) of liquor.  . If you are 63-63 years old, ask your health care provider if you should take aspirin to prevent strokes.  . Use sunscreen. Apply sunscreen liberally and repeatedly throughout the day. You should seek shade when your shadow is shorter than you. Protect yourself by wearing long sleeves, pants, a wide-brimmed hat, and sunglasses year round, whenever you are outdoors.  . Once a month, do a whole body skin exam, using a mirror to look at the skin on your back. Tell your health care provider of new moles, moles that have irregular borders, moles that are larger than a pencil eraser, or moles that have changed in shape or color.

## 2018-03-16 NOTE — Progress Notes (Signed)
PCP notes:   Health maintenance:  No gaps identified.  Abnormal screenings:   None  Patient concerns:   Patient has a chronic cough and states he is dealing with bronchitis.   Nurse concerns:  None  Next PCP appt:   04/06/18 @ 1730

## 2018-03-18 ENCOUNTER — Encounter: Payer: Self-pay | Admitting: Family Medicine

## 2018-03-18 ENCOUNTER — Other Ambulatory Visit: Payer: Self-pay | Admitting: Family Medicine

## 2018-03-18 MED ORDER — POTASSIUM CHLORIDE CRYS ER 20 MEQ PO TBCR: 20.0000 meq | EXTENDED_RELEASE_TABLET | Freq: Two times a day (BID) | ORAL | 1 refills | Status: DC

## 2018-03-28 ENCOUNTER — Telehealth: Payer: Self-pay | Admitting: Family Medicine

## 2018-03-28 DIAGNOSIS — R0600 Dyspnea, unspecified: Secondary | ICD-10-CM

## 2018-03-28 NOTE — Telephone Encounter (Signed)
It was not done for cough. It was done for dyspnea R06.00 and pedal edema R60.0 plz update and resubmit.  I'm not sure who to send this to - will send to [place].

## 2018-03-28 NOTE — Telephone Encounter (Signed)
Copied from [facility] [id number]. Topic: Quick Communication - See Telephone Encounter >> Mar 28, 2018  3:20 PM Percell Belt A wrote: CRM for notification. See Telephone encounter for: 03/28/18. Pt wife called in and stated that her ins compy sent a letter of denial to Dr Darnell Level for the 3D imaging on pts heart.  They denied it because it stated it was for a cough.  She is wanting someone to go back look at this test and see if this able to be resubmitted differently.    Case number [id number] Phone number [phone]

## 2018-03-29 NOTE — Telephone Encounter (Signed)
Called [employer] at [facility]. The 3D Imaging that was denied was the extra imaging that [facility] is asking all coordinators to ask to get an Authorization for before the 2D Echo gets done. They are trying to get these Authorizations even if they dont know if they need it or not. This extra test is what was denied and that was the letter that the patient received. I told Ms Robarts to call me if she gets any other correspondance about the 2D Echo and disregard the letter about the denial because they did not do this extra test on him and he wont be charged for it either.

## 2018-04-01 NOTE — Progress Notes (Signed)
I reviewed health advisor's note, was available for consultation, and agree with documentation and plan.  

## 2018-04-06 ENCOUNTER — Ambulatory Visit (INDEPENDENT_AMBULATORY_CARE_PROVIDER_SITE_OTHER): Payer: Medicare Other | Admitting: Family Medicine

## 2018-04-06 ENCOUNTER — Encounter: Payer: Self-pay | Admitting: Family Medicine

## 2018-04-06 VITALS — BP 158/84 | HR 78 | Temp 98.0°F | Ht 71.5 in | Wt 339.2 lb

## 2018-04-06 DIAGNOSIS — I10 Essential (primary) hypertension: Secondary | ICD-10-CM

## 2018-04-06 DIAGNOSIS — Z6841 Body Mass Index (BMI) 40.0 and over, adult: Secondary | ICD-10-CM

## 2018-04-06 DIAGNOSIS — R0600 Dyspnea, unspecified: Secondary | ICD-10-CM | POA: Diagnosis not present

## 2018-04-06 DIAGNOSIS — Z0000 Encounter for general adult medical examination without abnormal findings: Secondary | ICD-10-CM

## 2018-04-06 DIAGNOSIS — R05 Cough: Secondary | ICD-10-CM

## 2018-04-06 DIAGNOSIS — M170 Bilateral primary osteoarthritis of knee: Secondary | ICD-10-CM

## 2018-04-06 DIAGNOSIS — I714 Abdominal aortic aneurysm, without rupture, unspecified: Secondary | ICD-10-CM

## 2018-04-06 DIAGNOSIS — E118 Type 2 diabetes mellitus with unspecified complications: Secondary | ICD-10-CM

## 2018-04-06 DIAGNOSIS — E785 Hyperlipidemia, unspecified: Secondary | ICD-10-CM

## 2018-04-06 DIAGNOSIS — Z87891 Personal history of nicotine dependence: Secondary | ICD-10-CM

## 2018-04-06 DIAGNOSIS — Z9889 Other specified postprocedural states: Secondary | ICD-10-CM

## 2018-04-06 DIAGNOSIS — G4733 Obstructive sleep apnea (adult) (pediatric): Secondary | ICD-10-CM | POA: Diagnosis not present

## 2018-04-06 DIAGNOSIS — Z7189 Other specified counseling: Secondary | ICD-10-CM

## 2018-04-06 DIAGNOSIS — R053 Chronic cough: Secondary | ICD-10-CM

## 2018-04-06 DIAGNOSIS — Z8679 Personal history of other diseases of the circulatory system: Secondary | ICD-10-CM

## 2018-04-06 MED ORDER — DICLOFENAC SODIUM 1 % TD GEL: 1.0000 "application " | Freq: Three times a day (TID) | TRANSDERMAL | 1 refills | Status: DC

## 2018-04-06 MED ORDER — OMEPRAZOLE 40 MG PO CPDR: 40.0000 mg | DELAYED_RELEASE_CAPSULE | Freq: Every day | ORAL | 3 refills | Status: DC

## 2018-04-06 NOTE — Patient Instructions (Addendum)
We will refer you for lung cancer screening.  Look at setting up advanced directive. Packet provided today.  Sign release for records from Dr Einar Gip.  We will refer you back to Dr Einar Gip for further evaluation.  Start monitoring blood pressures at home and let me know if >140/90 to start another blood pressure medicine.   Diabetes Mellitus and Nutrition When you have diabetes (diabetes mellitus), it is very important to have healthy eating habits because your blood sugar (glucose) levels are greatly affected by what you eat and drink. Eating healthy foods in the appropriate amounts, at about the same times every day, can help you:  Control your blood glucose.  Lower your risk of heart disease.  Improve your blood pressure.  Reach or maintain a healthy weight.  Every person with diabetes is different, and each person has different needs for a meal plan. Your health care provider may recommend that you work with a diet and nutrition specialist (dietitian) to make a meal plan that is best for you. Your meal plan may vary depending on factors such as:  The calories you need.  The medicines you take.  Your weight.  Your blood glucose, blood pressure, and cholesterol levels.  Your activity level.  Other health conditions you have, such as heart or kidney disease.  How do carbohydrates affect me? Carbohydrates affect your blood glucose level more than any other type of food. Eating carbohydrates naturally increases the amount of glucose in your blood. Carbohydrate counting is a method for keeping track of how many carbohydrates you eat. Counting carbohydrates is important to keep your blood glucose at a healthy level, especially if you use insulin or take certain oral diabetes medicines. It is important to know how many carbohydrates you can safely have in each meal. This is different for every person. Your dietitian can help you calculate how many carbohydrates you should have at each meal  and for snack. Foods that contain carbohydrates include:  Bread, cereal, rice, pasta, and crackers.  Potatoes and corn.  Peas, beans, and lentils.  Milk and yogurt.  Fruit and juice.  Desserts, such as cakes, cookies, ice cream, and candy.  How does alcohol affect me? Alcohol can cause a sudden decrease in blood glucose (hypoglycemia), especially if you use insulin or take certain oral diabetes medicines. Hypoglycemia can be a life-threatening condition. Symptoms of hypoglycemia (sleepiness, dizziness, and confusion) are similar to symptoms of having too much alcohol. If your health care provider says that alcohol is safe for you, follow these guidelines:  Limit alcohol intake to no more than 1 drink per day for nonpregnant women and 2 drinks per day for men. One drink equals 12 oz of beer, 5 oz of wine, or 1 oz of hard liquor.  Do not drink on an empty stomach.  Keep yourself hydrated with water, diet soda, or unsweetened iced tea.  Keep in mind that regular soda, juice, and other mixers may contain a lot of sugar and must be counted as carbohydrates.  What are tips for following this plan? Reading food labels  Start by checking the serving size on the label. The amount of calories, carbohydrates, fats, and other nutrients listed on the label are based on one serving of the food. Many foods contain more than one serving per package.  Check the total grams (g) of carbohydrates in one serving. You can calculate the number of servings of carbohydrates in one serving by dividing the total carbohydrates by 15. For  example, if a food has 30 g of total carbohydrates, it would be equal to 2 servings of carbohydrates.  Check the number of grams (g) of saturated and trans fats in one serving. Choose foods that have low or no amount of these fats.  Check the number of milligrams (mg) of sodium in one serving. Most people should limit total sodium intake to less than 2,300 mg per  day.  Always check the nutrition information of foods labeled as "low-fat" or "nonfat". These foods may be higher in added sugar or refined carbohydrates and should be avoided.  Talk to your dietitian to identify your daily goals for nutrients listed on the label. Shopping  Avoid buying canned, premade, or processed foods. These foods tend to be high in fat, sodium, and added sugar.  Shop around the outside edge of the grocery store. This includes fresh fruits and vegetables, bulk grains, fresh meats, and fresh dairy. Cooking  Use low-heat cooking methods, such as baking, instead of high-heat cooking methods like deep frying.  Cook using healthy oils, such as olive, canola, or sunflower oil.  Avoid cooking with butter, cream, or high-fat meats. Meal planning  Eat meals and snacks regularly, preferably at the same times every day. Avoid going long periods of time without eating.  Eat foods high in fiber, such as fresh fruits, vegetables, beans, and whole grains. Talk to your dietitian about how many servings of carbohydrates you can eat at each meal.  Eat 4-6 ounces of lean protein each day, such as lean meat, chicken, fish, eggs, or tofu. 1 ounce is equal to 1 ounce of meat, chicken, or fish, 1 egg, or 1/4 cup of tofu.  Eat some foods each day that contain healthy fats, such as avocado, nuts, seeds, and fish. Lifestyle   Check your blood glucose regularly.  Exercise at least 30 minutes 5 or more days each week, or as told by your health care provider.  Take medicines as told by your health care provider.  Do not use any products that contain nicotine or tobacco, such as cigarettes and e-cigarettes. If you need help quitting, ask your health care provider.  Work with a Social worker or diabetes educator to identify strategies to manage stress and any emotional and social challenges. What are some questions to ask my health care provider?  Do I need to meet with a diabetes  educator?  Do I need to meet with a dietitian?  What number can I call if I have questions?  When are the best times to check my blood glucose? Where to find more information:  American Diabetes Association: diabetes.org/food-and-fitness/food  Academy of Nutrition and Dietetics: PokerClues.dk  [employer] of Diabetes and Digestive and Kidney Diseases (NIH): ContactWire.be Summary  A healthy meal plan will help you control your blood glucose and maintain a healthy lifestyle.  Working with a diet and nutrition specialist (dietitian) can help you make a meal plan that is best for you.  Keep in mind that carbohydrates and alcohol have immediate effects on your blood glucose levels. It is important to count carbohydrates and to use alcohol carefully. This information is not intended to replace advice given to you by your health care provider. Make sure you discuss any questions you have with your health care provider. Document Released: 05/14/2005 Document Revised: 09/21/2016 Document Reviewed: 09/21/2016 Elsevier Interactive Patient Education  2018 [place] Maintenance, Male A healthy lifestyle and preventive care is important for your health and wellness.  Ask your health care provider about what schedule of regular examinations is right for you. What should I know about weight and diet? Eat a Healthy Diet  Eat plenty of vegetables, fruits, whole grains, low-fat dairy products, and lean protein.  Do not eat a lot of foods high in solid fats, added sugars, or salt.  Maintain a Healthy Weight Regular exercise can help you achieve or maintain a healthy weight. You should:  Do at least 150 minutes of exercise each week. The exercise should increase your heart rate and make you sweat (moderate-intensity exercise).  Do strength-training  exercises at least twice a week.  Watch Your Levels of Cholesterol and Blood Lipids  Have your blood tested for lipids and cholesterol every 5 years starting at 70 years of age. If you are at high risk for heart disease, you should start having your blood tested when you are 70 years old. You may need to have your cholesterol levels checked more often if: ? Your lipid or cholesterol levels are high. ? You are older than 70 years of age. ? You are at high risk for heart disease.  What should I know about cancer screening? Many types of cancers can be detected early and may often be prevented. Lung Cancer  You should be screened every year for lung cancer if: ? You are a current smoker who has smoked for at least 30 years. ? You are a former smoker who has quit within the past 15 years.  Talk to your health care provider about your screening options, when you should start screening, and how often you should be screened.  Colorectal Cancer  Routine colorectal cancer screening usually begins at 70 years of age and should be repeated every 5-10 years until you are 70 years old. You may need to be screened more often if early forms of precancerous polyps or small growths are found. Your health care provider may recommend screening at an earlier age if you have risk factors for colon cancer.  Your health care provider may recommend using home test kits to check for hidden blood in the stool.  A small camera at the end of a tube can be used to examine your colon (sigmoidoscopy or colonoscopy). This checks for the earliest forms of colorectal cancer.  Prostate and Testicular Cancer  Depending on your age and overall health, your health care provider may do certain tests to screen for prostate and testicular cancer.  Talk to your health care provider about any symptoms or concerns you have about testicular or prostate cancer.  Skin Cancer  Check your skin from head to toe regularly.  Tell  your health care provider about any new moles or changes in moles, especially if: ? There is a change in a mole's size, shape, or color. ? You have a mole that is larger than a pencil eraser.  Always use sunscreen. Apply sunscreen liberally and repeat throughout the day.  Protect yourself by wearing long sleeves, pants, a wide-brimmed hat, and sunglasses when outside.  What should I know about heart disease, diabetes, and high blood pressure?  If you are 73-39 years of age, have your blood pressure checked every 3-5 years. If you are 62 years of age or older, have your blood pressure checked every year. You should have your blood pressure measured twice-once when you are at a hospital or clinic, and once when you are not at a hospital or clinic. Record the average of the two measurements.  To check your blood pressure when you are not at a hospital or clinic, you can use: ? An automated blood pressure machine at a pharmacy. ? A home blood pressure monitor.  Talk to your health care provider about your target blood pressure.  If you are between 30-22 years old, ask your health care provider if you should take aspirin to prevent heart disease.  Have regular diabetes screenings by checking your fasting blood sugar level. ? If you are at a normal weight and have a low risk for diabetes, have this test once every three years after the age of 32. ? If you are overweight and have a high risk for diabetes, consider being tested at a younger age or more often.  A one-time screening for abdominal aortic aneurysm (AAA) by ultrasound is recommended for men aged 2-75 years who are current or former smokers. What should I know about preventing infection? Hepatitis B If you have a higher risk for hepatitis B, you should be screened for this virus. Talk with your health care provider to find out if you are at risk for hepatitis B infection. Hepatitis C Blood testing is recommended for:  Everyone born  from [birth year] through 1963.  Anyone with known risk factors for hepatitis C.  Sexually Transmitted Diseases (STDs)  You should be screened each year for STDs including gonorrhea and chlamydia if: ? You are sexually active and are younger than 71 years of age. ? You are older than 70 years of age and your health care provider tells you that you are at risk for this type of infection. ? Your sexual activity has changed since you were last screened and you are at an increased risk for chlamydia or gonorrhea. Ask your health care provider if you are at risk.  Talk with your health care provider about whether you are at high risk of being infected with HIV. Your health care provider may recommend a prescription medicine to help prevent HIV infection.  What else can I do?  Schedule regular health, dental, and eye exams.  Stay current with your vaccines (immunizations).  Do not use any tobacco products, such as cigarettes, chewing tobacco, and e-cigarettes. If you need help quitting, ask your health care provider.  Limit alcohol intake to no more than 2 drinks per day. One drink equals 12 ounces of beer, 5 ounces of wine, or 1 ounces of hard liquor.  Do not use street drugs.  Do not share needles.  Ask your health care provider for help if you need support or information about quitting drugs.  Tell your health care provider if you often feel depressed.  Tell your health care provider if you have ever been abused or do not feel safe at home. This information is not intended to replace advice given to you by your health care provider. Make sure you discuss any questions you have with your health care provider. Document Released: 02/13/2008 Document Revised: 04/15/2016 Document Reviewed: 05/21/2015 Elsevier Interactive Patient Education  [employer].

## 2018-04-06 NOTE — Progress Notes (Addendum)
BP (!) 158/84 (BP Location: Right Arm, Cuff Size: Large)   Pulse 78   Temp 98 F (36.7 C) (Oral)   Ht 5' 11.5" (1.816 m)   Wt (!) 339 lb 4 oz (153.9 kg)   SpO2 94%   BMI 46.66 kg/m    CC: CPE Subjective:    Patient ID: [name], male    DOB: [birth year], 72 y.o.   MRN: [id number]  HPI: William Hebert is a 72 y.o. male presenting on 04/06/2018 for Annual Exam (Pt 2.)   Saw Katha Hebert last month for medicare wellness visit. Note reviewed.    Ongoing prolonged cough some better with lasix 40mg  bid.  Spirometry last month: Normal spirometry. Post bronchodilator test not clearly improved. Pre: FVC 80%, FEV1 79%, ratio 0.72 Post: FVC 87%, FEV1 82%, ratio 0.69 Echocardiogram 02/2018 suboptimal images due to body habitus however overall WNL (mildly dilated aortic root and LA).  [facility] - new CPAP machine, having trouble with partial face mask. Last visit we added zantac 150mg  nightly to omeprazole 20mg  daily.  Not checking BP at home. Doesn't think he took BP med today. Takes labetalol 200mg  daily, nifedipine 60mg  daily, lasix 40mg  twice daily, once daily when on duty at work.   Requests trial diclofenac for bilateral knee arthritis.   H/o renal failure after some BP med - doesn't know what kind.  Preventative: COLONOSCOPY WITH PROPOFOL 02/23/2017 - 4 TA polyps, rpt 3-5 yrs ([place]) Prostate cancer screening - declines DRE.  Lung cancer screening - eligible, previously declined.  Flu shot - not done  Td 2015 Pneumovax 2017, prevnar 2015 Shingrix - declines - has had shingles Advanced directive discussion - would want daughter William Hebert to be HCPOA - also would want wife. Packet provided today - will mail.  Seat belt use discussed Sunscreen use discussed. No changing moles on skin. Needs to see derm.  Ex smoker - quit ~2007. Prior smoked 2 ppd. ~75 PY hx Alcohol - rarely Dentist - Q59mo  Eye exam yearly  Lives with wife Occ: semi-retired Corporate treasurer, works for [employer] Edu: HS Activity: no regular exercise - limited by knee pain Diet: no water, 1/2 gallon sweet tea - decreasing amt sugar in it though, some fruits/vegetables   Relevant past medical, surgical, family and social history reviewed and updated as indicated. Interim medical history since our last visit reviewed. Allergies and medications reviewed and updated. Outpatient Medications Prior to Visit  Medication Sig Dispense Refill  . albuterol (PROVENTIL HFA;VENTOLIN HFA) 108 (90 Base) MCG/ACT inhaler Inhale 1-2 puffs into the lungs every 4 (four) hours as needed for wheezing or shortness of breath.    . Ascorbic Acid (VITAMIN C PO) Take 1,000 mg by mouth daily.    [name] aspirin 81 MG chewable tablet Chew daily by mouth.    . benzonatate (TESSALON) 200 MG capsule Take 1 capsule (200 mg total) by mouth 3 (three) times daily as needed. Do not bite pill 30 capsule 1  . Cholecalciferol (VITAMIN D3) 50000 units CAPS Take 50,000 Units by mouth every Friday. IN THE MORNING. 12 capsule 1  . furosemide (LASIX) 40 MG tablet Take 1 tablet (40 mg total) by mouth 2 (two) times daily. 180 tablet 1  . labetalol (NORMODYNE) 200 MG tablet TAKE 1 TABLET DAILY BY  MOUTH. 90 tablet 0  . Multiple Vitamins-Minerals (MULTIVITAMIN ADULT PO) Take 1 tablet by mouth daily. MEN'S 50+ MULTIVITAMIN.    [name] NIFEdipine (PROCARDIA XL/ADALAT-CC) 60 MG 24  hr tablet TAKE 1 TABLET BY MOUTH  DAILY 90 tablet 0  . potassium chloride SA (K-DUR,KLOR-CON) 20 MEQ tablet Take 1 tablet (20 mEq total) by mouth 2 (two) times daily. With lasix 180 tablet 1  . pravastatin (PRAVACHOL) 80 MG tablet TAKE 1 TABLET BY MOUTH  DAILY AT NIGHT. 90 tablet 0  . Turmeric 500 MG TABS Take 1 tablet by mouth daily.    . Zinc 100 MG TABS Take 1 tablet by mouth daily.    [name] omeprazole (PRILOSEC) 20 MG capsule Take 1 capsule (20 mg total) by mouth daily. TAKE 1 CAPSULE BY MOUTH TWICE A DAY     No facility-administered medications prior to visit.      Per HPI unless  specifically indicated in ROS section below Review of Systems  Constitutional: Negative for activity change, appetite change, chills, fatigue, fever and unexpected weight change.  HENT: Negative for hearing loss.   Eyes: Positive for visual disturbance (PVD).  Respiratory: Positive for cough and shortness of breath. Negative for chest tightness and wheezing.   Cardiovascular: Positive for leg swelling. Negative for chest pain and palpitations.  Gastrointestinal: Negative for abdominal distention, abdominal pain, blood in stool, constipation, diarrhea, nausea and vomiting.  Genitourinary: Negative for difficulty urinating and hematuria.  Musculoskeletal: Negative for arthralgias, myalgias and neck pain.  Skin: Negative for rash.  Neurological: Negative for dizziness, seizures, syncope and headaches.  Hematological: Negative for adenopathy. Does not bruise/bleed easily.  Psychiatric/Behavioral: Negative for dysphoric mood. The patient is not nervous/anxious.        Objective:    BP (!) 158/84 (BP Location: Right Arm, Cuff Size: Large)   Pulse 78   Temp 98 F (36.7 C) (Oral)   Ht 5' 11.5" (1.816 m)   Wt (!) 339 lb 4 oz (153.9 kg)   SpO2 94%   BMI 46.66 kg/m   Wt Readings from Last 3 Encounters:  04/06/18 (!) 339 lb 4 oz (153.9 kg)  03/16/18 (!) 339 lb (153.8 kg)  03/14/18 (!) 340 lb 4 oz (154.3 kg)    Physical Exam  Constitutional: He is oriented to person, place, and time. He appears well-developed and well-nourished. No distress.  HENT:  Head: Normocephalic and atraumatic.  Right Ear: Hearing, tympanic membrane, external ear and ear canal normal.  Left Ear: Hearing, tympanic membrane, external ear and ear canal normal.  Nose: Nose normal.  Mouth/Throat: Uvula is midline, oropharynx is clear and moist and mucous membranes are normal. No oropharyngeal exudate, posterior oropharyngeal edema or posterior oropharyngeal erythema.  Eyes: Pupils are equal, round, and reactive to  light. Conjunctivae and EOM are normal. No scleral icterus.  Neck: Normal range of motion. Neck supple. No thyromegaly present.  Cardiovascular: Normal rate, regular rhythm, normal heart sounds and intact distal pulses.  No murmur heard. Pulses:      Radial pulses are 2+ on the right side, and 2+ on the left side.  Pulmonary/Chest: Effort normal and breath sounds normal. No respiratory distress. He has no wheezes. He has no rales.  Coarse bibasilarly  Abdominal: Soft. Bowel sounds are normal. He exhibits no distension and no mass. There is no tenderness. There is no rebound and no guarding.  Central obesity  Musculoskeletal: Normal range of motion. He exhibits edema (nonpitting).  Lymphadenopathy:    He has no cervical adenopathy.  Neurological: He is alert and oriented to person, place, and time.  CN grossly intact, station and gait intact  Skin: Skin is warm and dry. No  rash noted.  Psychiatric: He has a normal mood and affect. His behavior is normal. Judgment and thought content normal.  Nursing note and vitals reviewed.  Results for orders placed or performed in visit on 03/16/18  Comprehensive metabolic panel  Result Value Ref Range   Sodium 140 135 - 145 mEq/L   Potassium 3.3 (L) 3.5 - 5.1 mEq/L   Chloride 100 96 - 112 mEq/L   CO2 30 19 - 32 mEq/L   Glucose, Bld 151 (H) 70 - 99 mg/dL   BUN 17 6 - 23 mg/dL   Creatinine, Ser 1.22 0.40 - 1.50 mg/dL   Total Bilirubin 0.5 0.2 - 1.2 mg/dL   Alkaline Phosphatase 64 39 - 117 U/L   AST 20 0 - 37 U/L   ALT 24 0 - 53 U/L   Total Protein 6.1 6.0 - 8.3 g/dL   Albumin 3.8 3.5 - 5.2 g/dL   Calcium 9.3 8.4 - 10.5 mg/dL   GFR 62.34 >60.00 mL/min  VITAMIN D 25 Hydroxy (Vit-D Deficiency, Fractures)  Result Value Ref Range   VITD 42.26 30.00 - 100.00 ng/mL  PSA  Result Value Ref Range   PSA 0.68 0.10 - 4.00 ng/mL  Hemoglobin A1c  Result Value Ref Range   Hgb A1c MFr Bld 6.9 (H) 4.6 - 6.5 %  TSH  Result Value Ref Range   TSH 3.01  0.35 - 4.50 uIU/mL  Lipid panel  Result Value Ref Range   Cholesterol 128 0 - 200 mg/dL   Triglycerides 260.0 (H) 0.0 - 149.0 mg/dL   HDL 30.80 (L) >39.00 mg/dL   VLDL 52.0 (H) 0.0 - 40.0 mg/dL   Total CHOL/HDL Ratio 4    NonHDL 97.03   LDL cholesterol, direct  Result Value Ref Range   Direct LDL 66.0 mg/dL      Assessment & Plan:   Problem List Items Addressed This Visit    S/P AAA (abdominal aortic aneurysm) repair   Osteoarthritis of both knees    Ongoing, activity limiting.  Will Rx voltaren gel for this.      Obstructive sleep apnea    Encouraged he continue CPAP use.   ADDENDUM ==> reviewed CPAP data from [facility] - pressure at 19cm, excellent compliance, AHI 1.3, high leak.      Relevant Orders   Ambulatory referral to Cardiology   Hypertension    Chronic, elevated today. Pt doesn't remember if he took medication today. Advised take lasix when he gets home today. Advised to start monitoring blood pressures at home and let me know if consistently elevated to change regimen. H/o ARF to antihypertensive (?ACEI)      Health maintenance examination - Primary    Preventative protocols reviewed and updated unless pt declined. Discussed healthy diet and lifestyle.       Ex-smoker    Recent spirometry normal Reviewed lung cancer risk - he is eligible for lung cancer screening CT and this year is interested - will refer.       Relevant Orders   Ambulatory Referral for Lung Cancer Scre   Dyspnea   Relevant Orders   Ambulatory referral to Cardiology   Dyslipidemia    Chronic, LDL at goal, trig too high. Work towards better glycemic control and reassess. Continue pravastatin 80mg  daily.  The ASCVD Risk score Mikey Bussing DC Jr., et al., 2013) failed to calculate for the following reasons:   The valid total cholesterol range is 130 to 320 mg/dL  Controlled diabetes mellitus type 2 with complications ([place])    New. Reviewed recent diagnosis. Recommended low carb low sugar  diabetic diet, diabetes and nutrition handout provided. RTC 3 mo close f/u visit, recheck A1c at that time.       Relevant Orders   Ambulatory referral to Cardiology   Chronic cough    Persistent for months, may be improving on lasix. ?CHF related - echo was stable but overall was poor quality. Reviewed this with patient and wife. Will refer to cards for further eval. Discussed goal weight loss and optimizing blood pressure control. Continue lasix 40mg  daily, BID if needed (if not working).      Relevant Orders   Ambulatory referral to Cardiology   Body mass index (BMI) of 45.0 to 49.9 in adult [facility])    Reviewed healthy diet and lifestyle changes to affect sustainable weight loss. Activity limited by knee pain - recommended he focus on diet.       Advanced care planning/counseling discussion    Advanced directive discussion - would want daughter William Hebert to be HCPOA - also would want wife. Packet provided today - will mail.       AAA (abdominal aortic aneurysm) without rupture [facility])    S/p endovascular repair 2012 with stent Kellie Simmering), yearly f/u. Last seen 07/2017.          Meds ordered this encounter  Medications  . diclofenac sodium (VOLTAREN) 1 % GEL    Sig: Apply 1 application topically 3 (three) times daily.    Dispense:  1 Tube    Refill:  1  . omeprazole (PRILOSEC) 40 MG capsule    Sig: Take 1 capsule (40 mg total) by mouth daily.    Dispense:  90 capsule    Refill:  3   Orders Placed This Encounter  Procedures  . Ambulatory Referral for Lung Cancer Scre    Referral Priority:   Routine    Referral Type:   Consultation    Referral Reason:   Specialty Services Required    Number of Visits Requested:   1  . Ambulatory referral to Cardiology    Referral Priority:   Routine    Referral Type:   Consultation    Referral Reason:   Specialty Services Required    Requested Specialty:   Cardiology    Number of Visits Requested:   1    Follow up plan: Return in  about 3 months (around 07/07/2018) for follow up visit.  Ria Bush, MD

## 2018-04-07 NOTE — Assessment & Plan Note (Signed)
Reviewed healthy diet and lifestyle changes to affect sustainable weight loss. Activity limited by knee pain - recommended he focus on diet.

## 2018-04-07 NOTE — Assessment & Plan Note (Signed)
Chronic, LDL at goal, trig too high. Work towards better glycemic control and reassess. Continue pravastatin 80mg  daily.  The ASCVD Risk score Mikey Bussing DC Jr., et al., 2013) failed to calculate for the following reasons:   The valid total cholesterol range is 130 to 320 mg/dL

## 2018-04-07 NOTE — Assessment & Plan Note (Signed)
Advanced directive discussion - would want daughter [name] to be HCPOA - also would want wife. Packet provided today - will mail.

## 2018-04-07 NOTE — Assessment & Plan Note (Addendum)
Encouraged he continue CPAP use.   ADDENDUM ==> reviewed CPAP data from [facility] - pressure at 19cm, excellent compliance, AHI 1.3, high leak.

## 2018-04-07 NOTE — Assessment & Plan Note (Signed)
Chronic, elevated today. Pt doesn't remember if he took medication today. Advised take lasix when he gets home today. Advised to start monitoring blood pressures at home and let me know if consistently elevated to change regimen. H/o ARF to antihypertensive (?ACEI)

## 2018-04-07 NOTE — Assessment & Plan Note (Addendum)
Persistent for months, may be improving on lasix. ?CHF related - echo was stable but overall was poor quality. Reviewed this with patient and wife. Will refer to cards for further eval. Discussed goal weight loss and optimizing blood pressure control. Continue lasix 40mg  daily, BID if needed (if not working).

## 2018-04-07 NOTE — Assessment & Plan Note (Signed)
Preventative protocols reviewed and updated unless pt declined. Discussed healthy diet and lifestyle.  

## 2018-04-07 NOTE — Assessment & Plan Note (Signed)
Recent spirometry normal Reviewed lung cancer risk - he is eligible for lung cancer screening CT and this year is interested - will refer.

## 2018-04-07 NOTE — Assessment & Plan Note (Addendum)
Ongoing, activity limiting.  Will Rx voltaren gel for this.

## 2018-04-07 NOTE — Assessment & Plan Note (Signed)
S/p endovascular repair 2012 with stent Kellie Simmering), yearly f/u. Last seen 07/2017.

## 2018-04-07 NOTE — Assessment & Plan Note (Signed)
New. Reviewed recent diagnosis. Recommended low carb low sugar diabetic diet, diabetes and nutrition handout provided. RTC 3 mo close f/u visit, recheck A1c at that time.

## 2018-04-11 ENCOUNTER — Telehealth: Payer: Self-pay | Admitting: *Deleted

## 2018-04-11 NOTE — Telephone Encounter (Signed)
Received referral for low dose lung cancer screening CT scan. Message left at phone number listed in EMR for patient to call me back to facilitate scheduling scan.  

## 2018-04-21 ENCOUNTER — Telehealth: Payer: Self-pay | Admitting: *Deleted

## 2018-04-21 NOTE — Telephone Encounter (Signed)
Received referral for low dose lung cancer screening CT scan.  Unable to leave message at this time, will attempt call at later point.       

## 2018-04-22 ENCOUNTER — Telehealth: Payer: Self-pay | Admitting: *Deleted

## 2018-04-22 DIAGNOSIS — Z122 Encounter for screening for malignant neoplasm of respiratory organs: Secondary | ICD-10-CM

## 2018-04-22 NOTE — Telephone Encounter (Signed)
Received a referral for initial lung cancer screening scan.  Contacted the patient and obtained their smoking history, former smoker, quit in 2013, 76.5 pkyr history  as well as answering questions related to screening process.  Patient denies signs of lung cancer such as weight loss or hemoptysis at this time.  Patient denies comorbidity that would prevent curative treatment if lung cancer were found.  Patient is scheduled for the Shared Decision Making Visit and CT scan on 05-10-18!@1445  .

## 2018-04-25 ENCOUNTER — Encounter: Payer: Self-pay | Admitting: Family Medicine

## 2018-04-29 ENCOUNTER — Encounter: Payer: Self-pay | Admitting: Family Medicine

## 2018-05-07 ENCOUNTER — Encounter: Payer: Self-pay | Admitting: Family Medicine

## 2018-05-07 DIAGNOSIS — I5033 Acute on chronic diastolic (congestive) heart failure: Secondary | ICD-10-CM | POA: Insufficient documentation

## 2018-05-07 DIAGNOSIS — I5032 Chronic diastolic (congestive) heart failure: Secondary | ICD-10-CM | POA: Insufficient documentation

## 2018-05-09 ENCOUNTER — Encounter: Payer: Self-pay | Admitting: Oncology

## 2018-05-10 ENCOUNTER — Inpatient Hospital Stay: Payer: Medicare Other | Attending: Oncology | Admitting: Oncology

## 2018-05-10 ENCOUNTER — Ambulatory Visit
Admission: RE | Admit: 2018-05-10 | Discharge: 2018-05-10 | Disposition: A | Discharge status: Home / Self Care | Payer: Medicare Other | Source: Ambulatory Visit | Attending: Oncology | Admitting: Oncology

## 2018-05-10 DIAGNOSIS — Z122 Encounter for screening for malignant neoplasm of respiratory organs: Secondary | ICD-10-CM | POA: Insufficient documentation

## 2018-05-10 DIAGNOSIS — Z87891 Personal history of nicotine dependence: Secondary | ICD-10-CM

## 2018-05-10 DIAGNOSIS — J439 Emphysema, unspecified: Secondary | ICD-10-CM | POA: Insufficient documentation

## 2018-05-10 NOTE — Progress Notes (Signed)
In accordance with CMS guidelines, patient has met eligibility criteria including age, absence of signs or symptoms of lung cancer.  Social History   Tobacco Use  . Smoking status: Former Smoker    Packs/day: 1.50    Years: 51.00    Pack years: 76.50    Types: Cigarettes    Last attempt to quit: 2013    Years since quitting: 6.6  . Smokeless tobacco: Former User    Types: Snuff, Chew    Quit date: 07/06/2006  Substance Use Topics  . Alcohol use: Yes    Alcohol/week: 0.0 standard drinks    Comment: rarely  . Drug use: No     A shared decision-making session was conducted prior to the performance of CT scan. This includes one or more decision aids, includes benefits and harms of screening, follow-up diagnostic testing, over-diagnosis, false positive rate, and total radiation exposure.  Counseling on the importance of adherence to annual lung cancer LDCT screening, impact of co-morbidities, and ability or willingness to undergo diagnosis and treatment is imperative for compliance of the program.  Counseling on the importance of continued smoking cessation for former smokers; the importance of smoking cessation for current smokers, and information about tobacco cessation interventions have been given to patient including [place] [facility] and 1800 quit [place] programs.  Written order for lung cancer screening with LDCT has been given to the patient and any and all questions have been answered to the best of my abilities.   Yearly follow up will be coordinated by Shawn Perkins, Thoracic Navigator.  Jennifer Burns, NP 05/10/2018 1:15 PM  

## 2018-05-12 ENCOUNTER — Encounter: Payer: Self-pay | Admitting: Family Medicine

## 2018-05-12 ENCOUNTER — Encounter: Payer: Self-pay | Admitting: *Deleted

## 2018-05-12 DIAGNOSIS — J449 Chronic obstructive pulmonary disease, unspecified: Secondary | ICD-10-CM | POA: Insufficient documentation

## 2018-05-17 ENCOUNTER — Other Ambulatory Visit: Payer: Self-pay | Admitting: Family Medicine

## 2018-05-17 ENCOUNTER — Telehealth: Payer: Self-pay | Admitting: Family Medicine

## 2018-05-17 NOTE — Telephone Encounter (Signed)
Pt received CPAP machine in July and returned it due to it turning off in the middle of the night. Pt wants another one like he had before. He will call with the details on which one it is. He's requesting a prescription to replace the one that he has had for the last seven years.

## 2018-06-01 ENCOUNTER — Other Ambulatory Visit: Payer: Self-pay | Admitting: Family Medicine

## 2018-06-03 ENCOUNTER — Encounter: Payer: Self-pay | Admitting: Family Medicine

## 2018-06-03 ENCOUNTER — Ambulatory Visit (INDEPENDENT_AMBULATORY_CARE_PROVIDER_SITE_OTHER): Payer: Medicare Other | Admitting: Family Medicine

## 2018-06-03 VITALS — BP 140/72 | HR 67 | Temp 98.2°F | Ht 71.5 in | Wt 339.5 lb

## 2018-06-03 DIAGNOSIS — M545 Low back pain, unspecified: Secondary | ICD-10-CM | POA: Insufficient documentation

## 2018-06-03 DIAGNOSIS — I10 Essential (primary) hypertension: Secondary | ICD-10-CM

## 2018-06-03 LAB — POC URINALSYSI DIPSTICK (AUTOMATED)
Bilirubin, UA: NEGATIVE
Blood, UA: NEGATIVE
Glucose, UA: NEGATIVE
Ketones, UA: NEGATIVE
Leukocytes, UA: NEGATIVE
Nitrite, UA: NEGATIVE
Protein, UA: POSITIVE — AB
Spec Grav, UA: 1.015 (ref 1.010–1.025)
Urobilinogen, UA: 0.2 E.U./dL
pH, UA: 6 (ref 5.0–8.0)

## 2018-06-03 MED ORDER — KETOROLAC TROMETHAMINE 30 MG/ML IJ SOLN
30.0000 mg | Freq: Once | INTRAMUSCULAR | Status: AC
  Administered 2018-06-03: 30 mg via INTRAMUSCULAR

## 2018-06-03 MED ORDER — METHOCARBAMOL 500 MG PO TABS: 500.0000 mg | ORAL_TABLET | Freq: Two times a day (BID) | ORAL | 0 refills | Status: DC | PRN

## 2018-06-03 NOTE — Addendum Note (Signed)
Addended by: Brenton Grills on: 48/11/395 95:36 PM   Modules accepted: Orders

## 2018-06-03 NOTE — Assessment & Plan Note (Signed)
Anticipate MSK cause like lat dorsi strain and spasm/paraspinous mm strain - reproducible, positional. UA today without blood. Rx toradol IM, robaxin muscle relaxant with sedation precautions. Continue tylenol scheduled. Pt declines stronger medication. Update if not improving with treatment.

## 2018-06-03 NOTE — Patient Instructions (Addendum)
You have muscle spasm/tightness of right paraspinous muscles Treat with toradol shot today.  Take muscle relaxant sent to pharmacy. Continue tylenol max 1000mg  four times a day.  Let us know if not improving with treatment.

## 2018-06-03 NOTE — Assessment & Plan Note (Signed)
BP stable on current regimen.  Update Cr after starting valsartan last month

## 2018-06-03 NOTE — Progress Notes (Signed)
BP 140/72 (BP Location: Left Arm, Patient Position: Sitting, Cuff Size: Large)   Pulse 67   Temp 98.2 F (36.8 C) (Oral)   Ht 5' 11.5" (1.816 m)   Wt (!) 339 lb 8 oz (154 kg)   SpO2 93%   BMI 46.69 kg/m    CC: back pain Subjective:    Patient ID: William Hebert, male    DOB: [birth year], 72 y.o.   MRN: [id number]  HPI: William Hebert is a 72 y.o. male presenting on 06/03/2018 for Back Pain (C/o lower back pain, mainly on right side. Started about 4 days ago. Pt accompanied by his wife.)   1 wk h/o R lower back pain. Denies inciting trauma/injury. Denies shooting pain down legs, numbness/weakness of legs, no bowel/bladder incontinence, saddle anesthesia, fevers.  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   Relevant past medical, surgical, family and social history reviewed and updated as indicated. Interim medical history since our last visit reviewed. Allergies and medications reviewed and updated. Outpatient Medications Prior to Visit  Medication Sig Dispense Refill  . albuterol (PROVENTIL HFA;VENTOLIN HFA) 108 (90 Base) MCG/ACT inhaler Inhale 1-2 puffs into the lungs every 4 (four) hours as needed for wheezing or shortness of breath.    . Ascorbic Acid (VITAMIN C PO) Take 1,000 mg by mouth daily.    [name] aspirin 81 MG chewable tablet Chew daily by mouth.    . benzonatate (TESSALON) 200 MG capsule Take 1 capsule (200 mg total) by mouth 3 (three) times daily as needed. Do not bite pill 30 capsule 1  . Cholecalciferol (VITAMIN D3) 50000 units CAPS Take 50,000 Units by mouth every Friday. IN THE MORNING. 12 capsule 1  . diclofenac sodium (VOLTAREN) 1 % GEL Apply 1 application topically 3 (three) times daily. 1 Tube 1  . furosemide (LASIX) 40 MG tablet Take 1 tablet (40 mg total) by mouth 2 (two) times daily. 180 tablet 1  . labetalol (NORMODYNE) 200 MG tablet TAKE 1 TABLET BY MOUTH  DAILY 90 tablet 2  .  Multiple Vitamins-Minerals (MULTIVITAMIN ADULT PO) Take 1 tablet by mouth daily. MEN'S 50+ MULTIVITAMIN.    [name] NIFEdipine (PROCARDIA XL/ADALAT-CC) 60 MG 24 hr tablet TAKE 1 TABLET BY MOUTH  DAILY 90 tablet 2  . omeprazole (PRILOSEC) 40 MG capsule Take 1 capsule (40 mg total) by mouth daily. 90 capsule 3  . potassium chloride SA (K-DUR,KLOR-CON) 20 MEQ tablet TAKE 1 TABLET BY MOUTH  DAILY 90 tablet 2  . rosuvastatin (CRESTOR) 20 MG tablet Take 1 tablet (20 mg total) by mouth daily.  3  . Turmeric 500 MG TABS Take 1 tablet by mouth daily.    . Zinc 100 MG TABS Take 1 tablet by mouth daily.    . pravastatin (PRAVACHOL) 80 MG tablet TAKE 1 TABLET BY MOUTH  DAILY AT NIGHT. 90 tablet 2  . valsartan-hydrochlorothiazide (DIOVAN-HCT) 80-12.5 MG tablet Take 1 tablet by mouth daily.  6  . labetalol (NORMODYNE) 200 MG tablet TAKE 1 TABLET DAILY BY  MOUTH. 90 tablet 0   No facility-administered medications prior to visit.      Per HPI unless specifically indicated in ROS section below Review of Systems     Objective:    BP 140/72 (BP Location: Left Arm, Patient Position: Sitting, Cuff Size: Large)   Pulse 67   Temp 98.2 F (36.8 C) (Oral)   Ht  5' 11.5" (1.816 m)   Wt (!) 339 lb 8 oz (154 kg)   SpO2 93%   BMI 46.69 kg/m   Wt Readings from Last 3 Encounters:  06/03/18 (!) 339 lb 8 oz (154 kg)  05/10/18 (!) 338 lb (153.3 kg)  04/06/18 (!) 339 lb 4 oz (153.9 kg)    Physical Exam  Constitutional: He appears well-developed and well-nourished. No distress.  Musculoskeletal: He exhibits edema (chronic).       Arms: No pain midline spine ++ R upper lumbar paraspinous mm tenderness with R lateral muscle spasm/tightness present Neg SLR bilaterally. No pain with int/ext rotation at hip. Neg FABER. No pain at SIJ, GTB or sciatic notch bilaterally.   Nursing note and vitals reviewed.  Results for orders placed or performed in visit on 06/03/18  POCT Urinalysis Dipstick (Automated)  Result Value  Ref Range   Color, UA yellow    Clarity, UA clear    Glucose, UA Negative Negative   Bilirubin, UA negative    Ketones, UA negative    Spec Grav, UA 1.015 1.010 - 1.025   Blood, UA negative    pH, UA 6.0 5.0 - 8.0   Protein, UA Positive (A) Negative   Urobilinogen, UA 0.2 0.2 or 1.0 E.U./dL   Nitrite, UA negative    Leukocytes, UA Negative Negative   Lab Results  Component Value Date   CREATININE 1.22 03/16/2018   BUN 17 03/16/2018   NA 140 03/16/2018   K 3.3 (L) 03/16/2018   CL 100 03/16/2018   CO2 30 03/16/2018       Assessment & Plan:   Problem List Items Addressed This Visit    Right low back pain - Primary    Anticipate MSK cause like lat dorsi strain and spasm/paraspinous mm strain - reproducible, positional. UA today without blood. Rx toradol IM, robaxin muscle relaxant with sedation precautions. Continue tylenol scheduled. Pt declines stronger medication. Update if not improving with treatment.       Relevant Medications   methocarbamol (ROBAXIN) 500 MG tablet   Other Relevant Orders   POCT Urinalysis Dipstick (Automated) (Completed)   Hypertension    BP stable on current regimen.  Update Cr after starting valsartan last month      Relevant Medications   valsartan-hydrochlorothiazide (DIOVAN-HCT) 80-12.5 MG tablet   Other Relevant Orders   Renal function panel       Meds ordered this encounter  Medications  . methocarbamol (ROBAXIN) 500 MG tablet    Sig: Take 1-2 tablets (500-1,000 mg total) by mouth 2 (two) times daily as needed for muscle spasms (sedation precautions).    Dispense:  40 tablet    Refill:  0   Orders Placed This Encounter  Procedures  . Renal function panel  . POCT Urinalysis Dipstick (Automated)    Follow up plan: Return if symptoms worsen or fail to improve.  Ria Bush, MD

## 2018-06-04 LAB — RENAL FUNCTION PANEL
Albumin: 4.1 g/dL (ref 3.6–5.1)
BUN/Creatinine Ratio: 16 (calc) (ref 6–22)
BUN: 22 mg/dL (ref 7–25)
CO2: 25 mmol/L (ref 20–32)
Calcium: 9.5 mg/dL (ref 8.6–10.3)
Chloride: 105 mmol/L (ref 98–110)
Creat: 1.37 mg/dL — ABNORMAL HIGH (ref 0.70–1.18)
Glucose, Bld: 131 mg/dL — ABNORMAL HIGH (ref 65–99)
Phosphorus: 2.7 mg/dL (ref 2.1–4.3)
Potassium: 3.7 mmol/L (ref 3.5–5.3)
Sodium: 141 mmol/L (ref 135–146)

## 2018-06-08 ENCOUNTER — Encounter: Payer: Self-pay | Admitting: Family Medicine

## 2018-10-12 ENCOUNTER — Other Ambulatory Visit: Payer: Self-pay | Admitting: Family Medicine

## 2018-11-02 NOTE — Progress Notes (Signed)
Subjective:   William Hebert, male    DOB: 1946/07/18, 73 y.o.   MRN: [id number]  Ria Bush, MD:  Chief Complaint  Patient presents with  . Hypertension  . Obesity    Weight Management  . Follow-up    27mo    HPI: William Hebert is a Caucasian 73y.o. with AAA and underwent endograft aortic repair on 05/19/2011, hypertension, prediabetes, morbid obesity, OSA on CPAP and complaint, chronic dyspnea and chronic leg edema. Underwent lexiscan nuclear stress test on 04/29/2018 felt to be intermediate risk; however, perfusion abnormalities felt to be tissue attenuation. He was noted to have coronary atherosclerosis of LAD by CT scan of chest.  Patient is here on 3 month follow up for obesity and hypertension. Blood pressure has been well controlled; however, patient has stopped his Valsartan HCT as he states it made him use the bathroom too much. He has not noticed any worsening shortness of breath or leg edema.  He is tolerating Crestor well. No myalgia symptoms. Did recently have right shoulder injection as he states that he has a torn rotator cuff that requires occasional shoulder injections. He also has chronic back pain.   He has been fairly active; however, less than since last time I have seen him. No exertional chest pain. Wife is present at bedside.     Past Medical History:  Diagnosis Date  . AAA [place]abdominal aortic aneurysm) without rupture ([facility] 07/10/2014   S/p endovascular repair 2012 with stent (Kellie Simmering, yearly f/u  . Arthritis   . Cancer ([facility]    skin cancer non melanioma  . Chronic bronchitis ([facility] 11/2016 AND 10-2016  . COPD (chronic obstructive pulmonary disease) ([facility])    chronic bronchitis  . GERD (gastroesophageal reflux disease)   . Headache(784.0)    in 20's  . Hepatitis    Carrier cant give blood gives a false positive  . Hernia of abdominal wall    LARGE FROM BREAST BONE AREA TO BELLY BUTTON  . History of skin cancer 2014  . HTN (hypertension)    . Hyperlipidemia   . Hypogonadism in male    prior on testosterone cream  . Morbidly obese ([facility]   . Pneumonia   . PONV (postoperative nausea and vomiting)    PONV AFTER AAA REPAIR, ALL OTHER SURGERIES WENT OK  . Posterior vitreous detachment 02/2018  . Shortness of breath    WITH EXERTION  . Sleep apnea    CPAP     Past Surgical History:  Procedure Laterality Date  . ABDOMINAL AORTIC ANEURYSM REPAIR  05/20/2011   enodvascular aorto bifem. stent graft (Kellie Simmering  . CATARACT EXTRACTION Bilateral 2014   X 2 BOTH EYES DONE TWICE  . COLONOSCOPY WITH PROPOFOL N/A 02/23/2017   4 TA polyps, rpt 3-5 yrs ([place])  . ELBOW SURGERY  2012  . FOOT TENDON SURGERY Right   . KNEE SURGERY Right remote   arthroscopy x2  . MASS EXCISION N/A 05/10/2013   Procedure: EXCISION back MASS - cyst (Marcello MooresA. Cornett, MD)  . SHOULDER OPEN ROTATOR CUFF REPAIR Right 07/08/2017   Procedure: Right shoulder mini open rotator cuff repair;  Surgeon: BSusa Day MD;  Location: [facility];  Service: Orthopedics;  Laterality: Right;  90 mins; Interscalene Block  . SKIN SURGERY     2 different surgeries face   . UMBILICAL HERNIA REPAIR  2002    Family History  Problem Relation Age of Onset  . Cancer Mother  49       throat (nonsmoker)  . Heart disease Maternal Aunt   . Alcohol abuse Father        deceased 38yo    Social History   Socioeconomic History  . Marital status: Married    Spouse name: Not on file  . Number of children: 3  . Years of education: Not on file  . Highest education level: Not on file  Occupational History  . Occupation: Air traffic controller.   Social Needs  . Financial resource strain: Not on file  . Food insecurity:    Worry: Not on file    Inability: Not on file  . Transportation needs:    Medical: Not on file    Non-medical: Not on file  Tobacco Use  . Smoking status: Former Smoker    Packs/day: 1.50    Years: 51.00    Pack years: 76.50    Types: Cigarettes    Last attempt to  quit: 2013    Years since quitting: 7.1  . Smokeless tobacco: Former Systems developer    Types: Snuff, Sarina Ser    Quit date: 07/06/2006  Substance and Sexual Activity  . Alcohol use: Yes    Alcohol/week: 0.0 standard drinks    Comment: rarely  . Drug use: No  . Sexual activity: Yes    Partners: Female  Lifestyle  . Physical activity:    Days per week: Not on file    Minutes per session: Not on file  . Stress: Not on file  Relationships  . Social connections:    Talks on phone: Not on file    Gets together: Not on file    Attends religious service: Not on file    Active member of club or organization: Not on file    Attends meetings of clubs or organizations: Not on file    Relationship status: Not on file  . Intimate partner violence:    Fear of current or ex partner: Not on file    Emotionally abused: Not on file    Physically abused: Not on file    Forced sexual activity: Not on file  Other Topics Concern  . 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 albuterol (PROVENTIL HFA;VENTOLIN HFA) 108 (90 Base) MCG/ACT inhaler Inhale 1-2 puffs into the lungs every 4 (four) hours as needed for wheezing or shortness of breath.  . Ascorbic Acid (VITAMIN C PO) Take 1,000 mg by mouth daily.  [name] aspirin 81 MG chewable tablet Chew daily by mouth.  . Cholecalciferol (VITAMIN D3) 1.25 MG (50000 UT) CAPS TAKE 50,000 UNITS (1 CAPSULE) BY MOUTH EVERY FRIDAY. IN THE MORNING.  [name] doxycycline (DORYX) 100 MG EC tablet Take 100 mg by mouth 2 (two) times daily.  . furosemide (LASIX) 40 MG tablet Take 1 tablet (40 mg total) by mouth 2 (two) times daily. (Patient taking differently: Take 40 mg by mouth daily. )  . labetalol (NORMODYNE) 200 MG tablet TAKE 1 TABLET BY MOUTH  DAILY  .  methocarbamol (ROBAXIN) 500 MG tablet Take 1-2 tablets (500-1,000 mg total) by mouth 2 (two) times daily as needed for muscle spasms (sedation precautions).  . Multiple Vitamins-Minerals (MULTIVITAMIN ADULT PO) Take 1 tablet by mouth daily.  MEN'S 50+ MULTIVITAMIN.  [name] NIFEdipine (PROCARDIA XL/ADALAT-CC) 60 MG 24 hr tablet TAKE 1 TABLET BY MOUTH  DAILY  . omeprazole (PRILOSEC) 40 MG capsule Take 1 capsule (40 mg total) by mouth daily.  . potassium chloride SA (K-DUR,KLOR-CON) 20 MEQ tablet TAKE 1 TABLET BY MOUTH  DAILY  . predniSONE (STERAPRED UNI-PAK 21 TAB) 5 MG (21) TBPK tablet continuous as needed.  . rosuvastatin (CRESTOR) 20 MG tablet Take 1 tablet (20 mg total) by mouth daily.  . Turmeric 500 MG TABS Take 1 tablet by mouth daily.  . Zinc 100 MG TABS Take 1 tablet by mouth daily.     Review of Systems  Constitution: Positive for malaise/fatigue and weight loss. Negative for decreased appetite, fever and weight gain.  HENT: Negative for sore throat.   Eyes: Negative for visual disturbance.  Cardiovascular: Negative.  Negative for chest pain, claudication, dyspnea on exertion, leg swelling, orthopnea, palpitations and syncope.  Respiratory: Negative.  Negative for cough, hemoptysis, shortness of breath and wheezing.   Endocrine: Negative for cold intolerance and heat intolerance.  Hematologic/Lymphatic: Negative.  Does not bruise/bleed easily.  Skin: Negative.  Negative for nail changes.  Musculoskeletal: Positive for back pain (chronic). Negative for joint swelling, muscle weakness and myalgias.  Gastrointestinal: Negative for abdominal pain, change in bowel habit, nausea and vomiting.  Neurological: Negative.  Negative for difficulty with concentration, dizziness, focal weakness and headaches.  Psychiatric/Behavioral: Negative for altered mental status, depression, substance abuse and suicidal ideas.  All other systems reviewed and are negative.      Objective:     Blood pressure  (!) 147/85, pulse 90, height '5\' 11"'$  (1.803 m), weight (!) 344 lb (156 kg), SpO2 94 %.  Cardiac studies:  EKG 11/03/2018: Normal sinus rhythm at 89 bpm with first degree AV block, left atrial enlargement, incomplete right bundle branch block. No evidence of ischemia.   Echocardiogram 03/15/2018: Procedure narrative: Transthoracic echocardiography. Image quality was suboptimal due to chest wall and/or lung interference. - Left ventricle: The cavity size was at the upper limits of   normal. Wall thickness was at the upper limits of normal.   Systolic function was normal. The estimated ejection fraction was   in the range of 60% to 65%. Left ventricular diastolic function   parameters were normal for the patient&'s age. - Aortic root: The aortic root was mildly dilated. - Left atrium: The atrium was mildly dilated. - Right ventricle: The cavity size was normal. Systolic function   was normal.  Lexiscan myoview stress test 04/29/2018: 1. Lexiscan stress test was performed. Exercise capacity was not assessed. No stress symptoms reported. Peak blood pressure was 144/92 mmHg. The resting and stress electrocardiogram demonstrated normal sinus rhythm, incomplete RBBB + LPFB, no resting arrhythmias and normal rest repolarization. Stress EKG is non diagnostic for ischemia as it is a pharmacologic stress. 2. The overall quality of the study is fair. There is significant breast attenuation, with imaging performed in the sitting position. Gated SPECT imaging reveals normal myocardial thickening and wall motion. The left ventricular ejection fraction was normal (63%). There is moderate sized fixed perfusion defect in basal to apical myocardium. Perfusion defect likely due to tissue attenuation (Patient's BMI is 48). Ischemia in this region cannot be excluded. 3. Intermediate risk study.  CXR 02/25/2018: No active cardiopulmonary disease.  Spirometry June 2019: Normal spirometry. Post bronchodilator test not  clearly improved. Pre: FVC 80%, FEV1 79%, ratio 0.72 Post: FVC 87%, FEV1 82%, ratio 0.69  AAA repair (  [facility]) and endograft placement 05/20/2011 with Samuel Jester Dr. Donley Redder. Kellie Simmering and follows him anually.  Sleep Study 04/2011: Severe obstructive sleep apnea.  Labs: 06/22/2018: Cholesterol 118, triglycerides 120, HDL 37, LDL 57. Glucose 124, creatinine 1.39, EGFR 51/59, potassium 3.6, CMP otherwise normal. 05/03/2018: Serum glucose 162 mg, BUN 12, creatinine 1.20, eGFR 61 mL, potassium 3.5. BNP 16.6. 02/25/2018: BNP 30. Potassium 3.3, creatinine 1.2, EGFR 62, CMP otherwise normal. Hemoglobin A1c 6.9%. TSH normal. Cholesterol 128, triglycerides 260, HDL 30, LDL unable to be calculated due to triglycerides. Direct LDL 66.   Physical Exam  Constitutional: He appears well-developed. No distress.  HENT:  Head: Atraumatic.  Eyes: Conjunctivae are normal.  Neck: Neck supple. No JVD present. No thyromegaly present.  Cardiovascular: Normal rate, regular rhythm and normal heart sounds. Exam reveals no gallop.  No murmur heard. Pulses:      Carotid pulses are 2+ on the right side and 2+ on the left side.      Femoral pulses are 1+ on the right side and 1+ on the left side.      Popliteal pulses are 1+ on the right side and 1+ on the left side.       Dorsalis pedis pulses are 1+ on the right side and 1+ on the left side.       Posterior tibial pulses are 2+ on the right side and 2+ on the left side.  Femoral and popliteal pulse difficult to feel due to patient's body habitus.   Pulmonary/Chest: Effort normal and breath sounds normal.  Abdominal: Soft. Bowel sounds are normal.  Musculoskeletal: Normal range of motion.        General: No edema.  Neurological: He is alert.  Skin: Skin is warm and dry.  Psychiatric: He has a normal mood and affect.         Assessment & Recommendations:   1. Dyspnea on exertion Likely secondary to obesity and deconditioning. Has remained stable. Does not appear  to be volume overloaded today. Encouraged to notify me for any worsening  2. Body mass index (BMI) of 45.0 to 49.9 in adult [facility]) Unfortunately, he has gained 10 lbs since last seen by me. It appears that he has not been adhering to his diet. I have discussed the importance of diet modifications with calorie restriction. Encouraged regular exercise.   3. Essential hypertension Elevated today. He has stopped his Valsartan HCT. He has type 2 diabetes and feel that he would benefit from ARB therapy as well for his likely diastolic dysfunction. Will start Valsartan '80mg'$  daily. He reports that he is overdue for follow up with PCP soon, and will schedule follow up. Will need CMP for follow up since starting Valsartan.   4. Coronary artery calcification seen on CT scan No symptoms of chest pain. Recommended aggressive risk factor modifications. On statin. Had intermediate stress test; however, felt to be breast tissue attenuation. Will continue to monitor.   5. CKD (chronic kidney disease) stage 2, GFR 60-89 ml/min Remained stable, but will need close monitoring since resuming Valsartan.  6. S/P AAA (abdominal aortic aneurysm) repair Has remained stable. Will need tight BP control and weight loss.   Plan: Overall, feel that patient is doing well. Encouraged them to contact me for any worsening problems and I will be happy to see him. Otherwise, I will see him back in 3-4 months for follow up.    Jeri Lager, MSN, APRN, FNP-C [facility], [place] Office: [phone] Fax: [phone]

## 2018-11-03 ENCOUNTER — Ambulatory Visit (INDEPENDENT_AMBULATORY_CARE_PROVIDER_SITE_OTHER): Payer: Medicare Other | Admitting: Cardiology

## 2018-11-03 ENCOUNTER — Encounter: Payer: Self-pay | Admitting: Cardiology

## 2018-11-03 VITALS — BP 147/85 | HR 90 | Ht 71.0 in | Wt 344.0 lb

## 2018-11-03 DIAGNOSIS — Z6841 Body Mass Index (BMI) 40.0 and over, adult: Secondary | ICD-10-CM

## 2018-11-03 DIAGNOSIS — N182 Chronic kidney disease, stage 2 (mild): Secondary | ICD-10-CM

## 2018-11-03 DIAGNOSIS — Z8679 Personal history of other diseases of the circulatory system: Secondary | ICD-10-CM

## 2018-11-03 DIAGNOSIS — I2510 Atherosclerotic heart disease of native coronary artery without angina pectoris: Secondary | ICD-10-CM

## 2018-11-03 DIAGNOSIS — R0609 Other forms of dyspnea: Secondary | ICD-10-CM | POA: Diagnosis not present

## 2018-11-03 DIAGNOSIS — Z9889 Other specified postprocedural states: Secondary | ICD-10-CM

## 2018-11-03 DIAGNOSIS — I10 Essential (primary) hypertension: Secondary | ICD-10-CM

## 2018-11-03 DIAGNOSIS — I129 Hypertensive chronic kidney disease with stage 1 through stage 4 chronic kidney disease, or unspecified chronic kidney disease: Secondary | ICD-10-CM

## 2018-11-03 MED ORDER — VALSARTAN 80 MG PO TABS: 80.0000 mg | ORAL_TABLET | Freq: Every day | ORAL | 3 refills | Status: DC

## 2018-11-03 MED ORDER — ROSUVASTATIN CALCIUM 20 MG PO TABS: 20.0000 mg | ORAL_TABLET | Freq: Every day | ORAL | 3 refills | Status: DC

## 2018-11-16 ENCOUNTER — Other Ambulatory Visit: Payer: Self-pay | Admitting: Family Medicine

## 2019-01-10 ENCOUNTER — Encounter: Payer: Self-pay | Admitting: Family Medicine

## 2019-01-10 ENCOUNTER — Ambulatory Visit (INDEPENDENT_AMBULATORY_CARE_PROVIDER_SITE_OTHER): Payer: Medicare Other | Admitting: Family Medicine

## 2019-01-10 VITALS — HR 68 | Temp 97.6°F | Ht 71.5 in

## 2019-01-10 DIAGNOSIS — R1902 Left upper quadrant abdominal swelling, mass and lump: Secondary | ICD-10-CM

## 2019-01-10 DIAGNOSIS — R1900 Intra-abdominal and pelvic swelling, mass and lump, unspecified site: Secondary | ICD-10-CM | POA: Insufficient documentation

## 2019-01-10 NOTE — Assessment & Plan Note (Signed)
Lump actually L lateral side - nontender at this time, started more red and inflamed per wife.  Unclear cause at this time. ?lipoma vs cyst. rec warm compresses BID x 5 days and office visit if worsening or persistent. Pt and wife agree.

## 2019-01-10 NOTE — Progress Notes (Signed)
Virtual visit completed through [employer]. Due to national recommendations of social distancing due to [place] 19, a virtual visit is felt to be most appropriate for this patient at this time.   Patient location: home Provider location: [place] [facility], office If any vitals were documented, they were collected by patient at home unless specified below.    Pulse 68   Temp 97.6 F (36.4 C) (Oral)   Ht 5' 11.5" (1.816 m)   BMI 47.31 kg/m    CC: L abd knot Subjective:    Patient ID: William Hebert, male    DOB: May 06, 1946, 73 y.o.   MRN: [id number]  HPI: William Hebert is a 73 y.o. male presenting on 01/10/2019 for Cough (C/o cough. Denies any other sxs. Started a couple of days ago. ) and Mass (Has knot on left side. Noticed 2-3 days ago. )   2-3d h/o knot noted L side - initially painful but not now. Denies inciting trauma/injury or fall. Feels long lump under skin. No bruising or redness. No bug bites.   Cough - same as his chronic cough. Not more productive, no more mucous than normal, no fever or dyspnea.  No Covid19 exposure at work. Works at [employer] COPD (chronic bronchitis).      Relevant past medical, surgical, family and social history reviewed and updated as indicated. Interim medical history since our last visit reviewed. Allergies and medications reviewed and updated. Outpatient Medications Prior to Visit  Medication Sig Dispense Refill  . albuterol (PROVENTIL HFA;VENTOLIN HFA) 108 (90 Base) MCG/ACT inhaler Inhale 1-2 puffs into the lungs every 4 (four) hours as needed for wheezing or shortness of breath.    . Ascorbic Acid (VITAMIN C PO) Take 1,000 mg by mouth daily.    [name] aspirin 81 MG chewable tablet Chew daily by mouth.    . Cholecalciferol (VITAMIN D3) 1.25 MG (50000 UT) CAPS TAKE 50,000 UNITS (1 CAPSULE) BY MOUTH EVERY FRIDAY. IN THE MORNING. 12 capsule 1  . furosemide (LASIX) 40 MG tablet TAKE 1 TABLET BY MOUTH TWO  TIMES DAILY 180 tablet 1  .  labetalol (NORMODYNE) 200 MG tablet TAKE 1 TABLET BY MOUTH  DAILY 90 tablet 2  . methocarbamol (ROBAXIN) 500 MG tablet Take 1-2 tablets (500-1,000 mg total) by mouth 2 (two) times daily as needed for muscle spasms (sedation precautions). 40 tablet 0  . Multiple Vitamins-Minerals (MULTIVITAMIN ADULT PO) Take 1 tablet by mouth daily. MEN'S 50+ MULTIVITAMIN.    [name] NIFEdipine (PROCARDIA XL/ADALAT-CC) 60 MG 24 hr tablet TAKE 1 TABLET BY MOUTH  DAILY 90 tablet 2  . omeprazole (PRILOSEC) 40 MG capsule Take 1 capsule (40 mg total) by mouth daily. 90 capsule 3  . potassium chloride SA (K-DUR,KLOR-CON) 20 MEQ tablet TAKE 1 TABLET BY MOUTH  DAILY 90 tablet 2  . predniSONE (STERAPRED UNI-PAK 21 TAB) 5 MG (21) TBPK tablet continuous as needed.    . rosuvastatin (CRESTOR) 20 MG tablet Take 1 tablet (20 mg total) by mouth daily. 90 tablet 3  . Turmeric 500 MG TABS Take 1 tablet by mouth daily.    . valsartan (DIOVAN) 80 MG tablet Take 1 tablet (80 mg total) by mouth daily. 90 tablet 3  . valsartan-hydrochlorothiazide (DIOVAN-HCT) 80-12.5 MG tablet Take 1 tablet by mouth daily.  6  . Zinc 100 MG TABS Take 1 tablet by mouth daily.    [name] doxycycline (DORYX) 100 MG EC tablet Take 100 mg by mouth 2 (two) times  daily.     No facility-administered medications prior to visit.      Per HPI unless specifically indicated in ROS section below Review of Systems Objective:    Pulse 68   Temp 97.6 F (36.4 C) (Oral)   Ht 5' 11.5" (1.816 m)   BMI 47.31 kg/m   Wt Readings from Last 3 Encounters:  11/03/18 (!) 344 lb (156 kg)  06/03/18 (!) 339 lb 8 oz (154 kg)  05/10/18 (!) 338 lb (153.3 kg)     Physical exam: Gen: alert, NAD, not ill appearing Pulm: speaks in complete sentences without increased work of breathing Psych: normal mood, normal thought content  Abd: hardened swelling at skin L side mid abdomen, no erythema or tenderness to palpation. No obvious bruising or bug bites/abrasions      Assessment &  Plan:   Problem List Items Addressed This Visit    Lump in the abdomen - Primary    Lump actually L lateral side - nontender at this time, started more red and inflamed per wife.  Unclear cause at this time. ?lipoma vs cyst. rec warm compresses BID x 5 days and office visit if worsening or persistent. Pt and wife agree.           No orders of the defined types were placed in this encounter.  No orders of the defined types were placed in this encounter.   Follow up plan: Return if symptoms worsen or fail to improve.  Ria Bush, MD

## 2019-03-08 ENCOUNTER — Other Ambulatory Visit: Payer: Self-pay

## 2019-03-08 ENCOUNTER — Ambulatory Visit (INDEPENDENT_AMBULATORY_CARE_PROVIDER_SITE_OTHER): Payer: Medicare Other | Admitting: Cardiology

## 2019-03-08 ENCOUNTER — Encounter: Payer: Self-pay | Admitting: Cardiology

## 2019-03-08 VITALS — BP 140/82 | HR 67 | Ht 71.0 in | Wt 339.0 lb

## 2019-03-08 DIAGNOSIS — R0609 Other forms of dyspnea: Secondary | ICD-10-CM

## 2019-03-08 DIAGNOSIS — N529 Male erectile dysfunction, unspecified: Secondary | ICD-10-CM

## 2019-03-08 DIAGNOSIS — Z6841 Body Mass Index (BMI) 40.0 and over, adult: Secondary | ICD-10-CM

## 2019-03-08 DIAGNOSIS — Z8679 Personal history of other diseases of the circulatory system: Secondary | ICD-10-CM

## 2019-03-08 DIAGNOSIS — I10 Essential (primary) hypertension: Secondary | ICD-10-CM

## 2019-03-08 DIAGNOSIS — N182 Chronic kidney disease, stage 2 (mild): Secondary | ICD-10-CM

## 2019-03-08 DIAGNOSIS — I129 Hypertensive chronic kidney disease with stage 1 through stage 4 chronic kidney disease, or unspecified chronic kidney disease: Secondary | ICD-10-CM | POA: Diagnosis not present

## 2019-03-08 DIAGNOSIS — Z9889 Other specified postprocedural states: Secondary | ICD-10-CM

## 2019-03-08 NOTE — Progress Notes (Signed)
Primary Physician:  Ria Bush, MD   Patient ID: William Hebert, male    DOB: 1945-09-28, 73 y.o.   MRN: [id number]  Subjective:    Chief Complaint  Patient presents with  . Hypertension  . Follow-up    HPI: William Hebert  is a 73 y.o. male  with AAA and underwent endograft aortic repair on 05/19/2011, hypertension, prediabetes, morbid obesity, OSA on CPAP and complaint, chronic dyspnea and chronic leg edema. Underwent lexiscan nuclear stress test on 04/29/2018 felt to be intermediate risk; however, perfusion abnormalities felt to be tissue attenuation. He was noted to have coronary atherosclerosis of LAD by CT scan of chest.  Patient is here on 3 month follow up for obesity and hypertension. Patient was started on plain Valsartan at his last appt, for diastolic dysfunction and diabetes, as he had stopped Valsartan HCT as he felt he went to the bathroom to much.  He has not noticed any worsening shortness of breath or leg edema. Has not seen vascular in several years for follow up on AAA repair.   He is tolerating Crestor well. No myalgia symptoms. He has chronic back pain.   He has been fairly active with swimming. Wife states that he complains of hurting all over and myalgia symptoms.   Past Medical History:  Diagnosis Date  . AAA (abdominal aortic aneurysm) without rupture ([place]) 07/10/2014   S/p endovascular repair 2012 with stent Kellie Simmering), yearly f/u  . Arthritis   . Cancer ([place])    skin cancer non melanioma  . Chronic bronchitis ([place]) 11/2016 AND 10-2016  . COPD (chronic obstructive pulmonary disease) ([facility])    chronic bronchitis  . GERD (gastroesophageal reflux disease)   . Headache(784.0)    in 20's  . Hepatitis    Carrier cant give blood gives a false positive  . Hernia of abdominal wall    LARGE FROM BREAST BONE AREA TO BELLY BUTTON  . History of skin cancer 2014  . HTN (hypertension)   . Hyperlipidemia   . Hypogonadism in male    prior on testosterone  cream  . Morbidly obese ([place])   . Pneumonia   . PONV (postoperative nausea and vomiting)    PONV AFTER AAA REPAIR, ALL OTHER SURGERIES WENT OK  . Posterior vitreous detachment 02/2018  . Shortness of breath    WITH EXERTION  . Sleep apnea    CPAP     Past Surgical History:  Procedure Laterality Date  . ABDOMINAL AORTIC ANEURYSM REPAIR  05/20/2011   enodvascular aorto bifem. stent graft Kellie Simmering)  . CATARACT EXTRACTION Bilateral 2014   X 2 BOTH EYES DONE TWICE  . COLONOSCOPY WITH PROPOFOL N/A 02/23/2017   4 TA polyps, rpt 3-5 yrs ([place])  . ELBOW SURGERY  2012  . FOOT TENDON SURGERY Right   . KNEE SURGERY Right remote   arthroscopy x2  . MASS EXCISION N/A 05/10/2013   Procedure: EXCISION back MASS - cyst Marcello Moores A. Cornett, MD)  . SHOULDER OPEN ROTATOR CUFF REPAIR Right 07/08/2017   Procedure: Right shoulder mini open rotator cuff repair;  Surgeon: Susa Day, MD;  Location: [facility];  Service: Orthopedics;  Laterality: Right;  90 mins; Interscalene Block  . SKIN SURGERY     2 different surgeries face   . UMBILICAL HERNIA REPAIR  2002    Social History   Socioeconomic History  . Marital status: Married    Spouse name: Not on file  . Number of children: 3  .  Years of education: Not on file  . Highest education level: Not on file  Occupational History  . Occupation: Air traffic controller.   Social Needs  . Financial resource strain: Not on file  . Food insecurity    Worry: Not on file    Inability: Not on file  . Transportation needs    Medical: Not on file    Non-medical: Not on file  Tobacco Use  . Smoking status: Former Smoker    Packs/day: 1.50    Years: 51.00    Pack years: 76.50    Types: Cigarettes    Quit date: 2013    Years since quitting: 7.5  . Smokeless tobacco: Former Systems developer    Types: Snuff, Sarina Ser    Quit date: 07/06/2006  Substance and Sexual Activity  . Alcohol use: Yes    Alcohol/week: 0.0 standard drinks    Comment: rarely  . Drug use: No  . Sexual  activity: Yes    Partners: Female  Lifestyle  . Physical activity    Days per week: Not on file    Minutes per session: Not on file  . Stress: Not on file  Relationships  . Social Herbalist on phone: Not on file    Gets together: Not on file    Attends religious service: Not on file    Active member of club or organization: Not on file    Attends meetings of clubs or organizations: Not on file    Relationship status: Not on file  . Intimate partner violence    Fear of current or ex partner: Not on file    Emotionally abused: Not on file    Physically abused: Not on file    Forced sexual activity: Not on file  Other Topics Concern  . 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 Negative for decreased appetite, fever and weight gain.  HENT: Negative for sore throat.   Eyes: Negative for visual disturbance.  Cardiovascular: Negative.  Negative for chest pain, claudication, dyspnea on exertion, leg swelling, orthopnea, palpitations and syncope.  Respiratory: Negative.  Negative for cough, hemoptysis, shortness of breath and wheezing.   Endocrine: Negative for cold intolerance and heat intolerance.  Hematologic/Lymphatic: Negative.  Does not bruise/bleed easily.  Skin: Negative.  Negative for nail changes.  Musculoskeletal: Positive for back pain (chronic). Negative for joint swelling, muscle weakness and myalgias.  Gastrointestinal: Negative for abdominal pain, change in bowel habit, nausea and vomiting.  Neurological: Negative.  Negative for difficulty with concentration, dizziness, focal weakness and headaches.  Psychiatric/Behavioral: Negative for altered mental status, depression, substance abuse and  suicidal ideas.  All other systems reviewed and are negative.     Objective:  Blood pressure (!) 160/92, pulse 67, height '5\' 11"'$  (1.803 m), weight (!) 339 lb (153.8 kg), SpO2 97 %. Body mass index is 47.28 kg/m.    Physical Exam  Constitutional: He appears well-developed. No distress.  Morbidly obese  HENT:  Head: Atraumatic.  Eyes: Conjunctivae are normal.  Neck: Neck supple. No JVD present. No thyromegaly present.  Cardiovascular: Normal rate, regular rhythm and normal heart sounds. Exam reveals no gallop.  No murmur heard. Pulses:  Carotid pulses are 2+ on the right side and 2+ on the left side.      Femoral pulses are 1+ on the right side and 1+ on the left side.      Popliteal pulses are 1+ on the right side and 1+ on the left side.       Dorsalis pedis pulses are 1+ on the right side and 1+ on the left side.       Posterior tibial pulses are 2+ on the right side and 2+ on the left side.  Femoral and popliteal pulse difficult to feel due to patient's body habitus.   Pulmonary/Chest: Effort normal and breath sounds normal.  Abdominal: Soft. Bowel sounds are normal.  Musculoskeletal: Normal range of motion.        General: No edema.  Neurological: He is alert.  Skin: Skin is warm and dry.  Psychiatric: He has a normal mood and affect.   Radiology: No results found.  Laboratory examination:   07/25/2018: Glucose 208, creatinine 1.12, eGFR 66, potassium 3.5, BMP otherwise normal.  06/22/2018: Cholesterol 118, triglycerides 120, HDL 37, LDL 57. Glucose 124, creatinine 1.39, EGFR 51/59, potassium 3.6, CMP otherwise normal.  05/03/2018: Serum glucose 162 mg, BUN 12, creatinine 1.20, eGFR 61 mL, potassium 3.5. BNP 16.6.  CMP Latest Ref Rng & Units 06/03/2018 03/16/2018 02/25/2018  Glucose 65 - 99 mg/dL 131(H) 151(H) 153(H)  BUN 7 - 25 mg/dL '22 17 16  '$ Creatinine 0.70 - 1.18 mg/dL 1.37(H) 1.22 1.41(H)  Sodium 135 - 146 mmol/L 141 140 142  Potassium 3.5 - 5.3 mmol/L 3.7  3.3(L) 3.8  Chloride 98 - 110 mmol/L 105 100 104  CO2 20 - 32 mmol/L '25 30 27  '$ Calcium 8.6 - 10.3 mg/dL 9.5 9.3 9.3  Total Protein 6.0 - 8.3 g/dL - 6.1 -  Total Bilirubin 0.2 - 1.2 mg/dL - 0.5 -  Alkaline Phos 39 - 117 U/L - 64 -  AST 0 - 37 U/L - 20 -  ALT 0 - 53 U/L - 24 -   CBC Latest Ref Rng & Units 02/25/2018 06/14/2017 11/25/2016  WBC 3.8 - 10.8 Thousand/uL 8.1 7.6 8.2  Hemoglobin 13.2 - 17.1 g/dL 14.6 14.1 14.4  Hematocrit 38.5 - 50.0 % 43.1 41.8 42.8  Platelets 140 - 400 Thousand/uL 200 198.0 208.0   Lipid Panel     Component Value Date/Time   CHOL 128 03/16/2018 1251   TRIG 260.0 (H) 03/16/2018 1251   HDL 30.80 (L) 03/16/2018 1251   CHOLHDL 4 03/16/2018 1251   VLDL 52.0 (H) 03/16/2018 1251   LDLCALC 61 09/10/2015 1446   LDLDIRECT 66.0 03/16/2018 1251   HEMOGLOBIN A1C Lab Results  Component Value Date   HGBA1C 6.9 (H) 03/16/2018   MPG 137 (H) 04/11/2011   TSH Recent Labs    03/16/18 1251  TSH 3.01    PRN Meds:. Medications Discontinued During This Encounter  Medication Reason  . predniSONE (STERAPRED UNI-PAK 21 TAB) 5 MG (21) TBPK tablet Error   Current Meds  Medication Sig  . albuterol (PROVENTIL HFA;VENTOLIN HFA) 108 (90 Base) MCG/ACT inhaler Inhale 1-2 puffs into the lungs every 4 (four) hours as needed for wheezing or shortness of breath.  . Ascorbic Acid (VITAMIN C PO) Take 1,000 mg by mouth daily.  [name] aspirin 81 MG chewable tablet Chew daily by mouth.  . Cholecalciferol (VITAMIN D3) 1.25 MG (50000 UT) CAPS TAKE 50,000 UNITS (1 CAPSULE) BY MOUTH EVERY FRIDAY. IN THE MORNING.  [name]  furosemide (LASIX) 40 MG tablet TAKE 1 TABLET BY MOUTH TWO  TIMES DAILY  . labetalol (NORMODYNE) 200 MG tablet TAKE 1 TABLET BY MOUTH  DAILY  . methocarbamol (ROBAXIN) 500 MG tablet Take 1-2 tablets (500-1,000 mg total) by mouth 2 (two) times daily as needed for muscle spasms (sedation precautions).  . Multiple Vitamins-Minerals (MULTIVITAMIN ADULT PO) Take 1 tablet by mouth  daily. MEN'S 50+ MULTIVITAMIN.  [name] NIFEdipine (PROCARDIA XL/ADALAT-CC) 60 MG 24 hr tablet TAKE 1 TABLET BY MOUTH  DAILY  . omeprazole (PRILOSEC) 40 MG capsule Take 1 capsule (40 mg total) by mouth daily.  . potassium chloride SA (K-DUR,KLOR-CON) 20 MEQ tablet TAKE 1 TABLET BY MOUTH  DAILY  . rosuvastatin (CRESTOR) 20 MG tablet Take 1 tablet (20 mg total) by mouth daily.  . Turmeric 500 MG TABS Take 1 tablet by mouth daily.  . Zinc 100 MG TABS Take 1 tablet by mouth daily.  . [DISCONTINUED] predniSONE (STERAPRED UNI-PAK 21 TAB) 5 MG (21) TBPK tablet continuous as needed.    Cardiac Studies:   Echocardiogram 03/15/2018: Procedure narrative: Transthoracic echocardiography. Image quality was suboptimal due to chest wall and/or lung interference. - Left ventricle: The cavity size was at the upper limits of  normal. Wall thickness was at the upper limits of normal.  Systolic function was normal. The estimated ejection fraction was  in the range of 60% to 65%. Left ventricular diastolic function  parameters were normal for the patient&'s age. - Aortic root: The aortic root was mildly dilated. - Left atrium: The atrium was mildly dilated. - Right ventricle: The cavity size was normal. Systolic function  was normal.  Lexiscan myoview stress test 04/29/2018: 1. Lexiscan stress test was performed. Exercise capacity was not assessed. No stress symptoms reported. Peak blood pressure was 144/92 mmHg. The resting and stress electrocardiogram demonstrated normal sinus rhythm, incomplete RBBB + LPFB, no resting arrhythmias and normal rest repolarization. Stress EKG is non diagnostic for ischemia as it is a pharmacologic stress. 2. The overall quality of the study is fair. There is significant breast attenuation, with imaging performed in the sitting position. Gated SPECT imaging reveals normal myocardial thickening and wall motion. The left ventricular ejection fraction was normal (63%). There is  moderate sized fixed perfusion defect in basal to apical myocardium. Perfusion defect likely due to tissue attenuation (Patient's BMI is 48). Ischemia in this region cannot be excluded. 3. Intermediate risk study.  CXR 02/25/2018: No active cardiopulmonary disease.  Spirometry June 2019: Normal spirometry. Post bronchodilator test not clearly improved. Pre: FVC 80%, FEV1 79%, ratio 0.72 Post: FVC 87%, FEV1 82%, ratio 0.69  AAA repair ([facility]) and endograft placement 05/20/2011 with Samuel Jester Dr. Donley Redder. Kellie Simmering and follows him anually.  Sleep Study 04/2011: Severe obstructive sleep apnea.  Assessment:     ICD-10-CM   1. Essential hypertension  I10 Comprehensive Metabolic Panel (CMET)    Lipid Profile    Lipid Profile    Comprehensive Metabolic Panel (CMET)  2. Dyspnea on exertion  R06.09   3. CKD (chronic kidney disease) stage 2, GFR 60-89 ml/min  N18.2 Comprehensive Metabolic Panel (CMET)    Comprehensive Metabolic Panel (CMET)  4. Body mass index (BMI) of 45.0 to 49.9 in adult [facility])  Z68.42   5. S/P AAA (abdominal aortic aneurysm) repair  L37.342 Lipid Profile   Z86.79 Lipid Profile    EKG 11/03/2018: Normal sinus rhythm at 89 bpm with first degree AV block, left atrial enlargement, incomplete right bundle branch block.  No evidence of ischemia.   Recommendations:   Patient is overall doing well since last seen 3 months ago. Has not had any worsening dyspnea or leg swelling. He has been swimming regularly and tolerating this well. Unfortunately, has not lost significant weight. Continue to feel that his weight is contributing to the majority of his medical issues. I have encouraged him to continue to work on this with diet and exercise.   Blood pressure was initially elevated in our office today, but improved on recheck. Continue with current medical therapy. Will need to follow up on his kidney function as well as his lipids. Will order CMP and lipid to be done in the next 1-2  weeks. Due to myalgia symptoms, I have asked him to hold Crestor for 2 weeks to see if related to this. His wife will notify me and if improvement, will change to different statin. He will continue to need aggressive lipid control given coronary calcification on CT scan.   He underwent AAA repair in 2012 and has remained stable since that time. It has been 2 years since last evaluation, will order CTA of abdomen for follow up. He was previously being followed by Vascular; however, patient request for [place] to continue with surveillance.   He does complain of erectile dysfunction that he states has been present for many years. Discussed many etiologies for ED including uncontrolled hypertension, drug induced, etc. He has previously used viagra; however, apparently was dizzy with this. Potentially related to low testosterone or labetalol; however, I have not recommended changing in view of previously difficult to control hypertension. He will discuss with his PCP. I will see him back in 3 months for follow up on HTN.  Miquel Dunn, MSN, APRN, FNP-C [facility]. [place] Office: [phone] Fax: [phone]

## 2019-03-10 NOTE — Telephone Encounter (Signed)
Please respond

## 2019-03-11 ENCOUNTER — Encounter: Payer: Self-pay | Admitting: Cardiology

## 2019-03-26 ENCOUNTER — Other Ambulatory Visit: Payer: Self-pay | Admitting: Family Medicine

## 2019-04-02 ENCOUNTER — Other Ambulatory Visit: Payer: Self-pay | Admitting: Family Medicine

## 2019-04-02 DIAGNOSIS — E785 Hyperlipidemia, unspecified: Secondary | ICD-10-CM

## 2019-04-02 DIAGNOSIS — Z125 Encounter for screening for malignant neoplasm of prostate: Secondary | ICD-10-CM

## 2019-04-02 DIAGNOSIS — E60 Dietary zinc deficiency: Secondary | ICD-10-CM

## 2019-04-02 DIAGNOSIS — E118 Type 2 diabetes mellitus with unspecified complications: Secondary | ICD-10-CM

## 2019-04-02 DIAGNOSIS — E559 Vitamin D deficiency, unspecified: Secondary | ICD-10-CM

## 2019-04-02 NOTE — Addendum Note (Signed)
Addended by: Ria Bush on: 04/02/2019 10:24 PM   Modules accepted: Orders

## 2019-04-03 ENCOUNTER — Ambulatory Visit: Payer: Medicare Other

## 2019-04-03 ENCOUNTER — Ambulatory Visit (INDEPENDENT_AMBULATORY_CARE_PROVIDER_SITE_OTHER): Payer: Medicare Other

## 2019-04-03 ENCOUNTER — Other Ambulatory Visit (INDEPENDENT_AMBULATORY_CARE_PROVIDER_SITE_OTHER): Payer: Medicare Other

## 2019-04-03 VITALS — BP 140/82 | HR 72 | Ht 70.0 in | Wt 330.0 lb

## 2019-04-03 DIAGNOSIS — E785 Hyperlipidemia, unspecified: Secondary | ICD-10-CM | POA: Diagnosis not present

## 2019-04-03 DIAGNOSIS — E559 Vitamin D deficiency, unspecified: Secondary | ICD-10-CM | POA: Diagnosis not present

## 2019-04-03 DIAGNOSIS — Z125 Encounter for screening for malignant neoplasm of prostate: Secondary | ICD-10-CM | POA: Diagnosis not present

## 2019-04-03 DIAGNOSIS — Z0000 Encounter for general adult medical examination without abnormal findings: Secondary | ICD-10-CM | POA: Diagnosis not present

## 2019-04-03 DIAGNOSIS — E118 Type 2 diabetes mellitus with unspecified complications: Secondary | ICD-10-CM

## 2019-04-03 DIAGNOSIS — E60 Dietary zinc deficiency: Secondary | ICD-10-CM

## 2019-04-03 LAB — COMPREHENSIVE METABOLIC PANEL
ALT: 19 U/L (ref 0–53)
AST: 16 U/L (ref 0–37)
Albumin: 3.8 g/dL (ref 3.5–5.2)
Alkaline Phosphatase: 74 U/L (ref 39–117)
BUN: 13 mg/dL (ref 6–23)
CO2: 28 mEq/L (ref 19–32)
Calcium: 9.3 mg/dL (ref 8.4–10.5)
Chloride: 103 mEq/L (ref 96–112)
Creatinine, Ser: 1.04 mg/dL (ref 0.40–1.50)
GFR: 70.3 mL/min (ref >60.00)
Glucose, Bld: 133 mg/dL — ABNORMAL HIGH (ref 70–99)
Potassium: 3.4 mEq/L — ABNORMAL LOW (ref 3.5–5.1)
Sodium: 141 mEq/L (ref 135–145)
Total Bilirubin: 0.3 mg/dL (ref 0.2–1.2)
Total Protein: 5.9 g/dL — ABNORMAL LOW (ref 6.0–8.3)

## 2019-04-03 LAB — VITAMIN D 25 HYDROXY (VIT D DEFICIENCY, FRACTURES): VITD: 41.02 ng/mL (ref 30.00–100.00)

## 2019-04-03 LAB — LIPID PANEL
Cholesterol: 177 mg/dL (ref 0–200)
HDL: 28 mg/dL — ABNORMAL LOW (ref >39.00)
NonHDL: 149.06
Total CHOL/HDL Ratio: 6
Triglycerides: 395 mg/dL — ABNORMAL HIGH (ref 0.0–149.0)
VLDL: 79 mg/dL — ABNORMAL HIGH (ref 0.0–40.0)

## 2019-04-03 LAB — HEMOGLOBIN A1C: Hgb A1c MFr Bld: 7.1 % — ABNORMAL HIGH (ref 4.6–6.5)

## 2019-04-03 LAB — PSA, MEDICARE: PSA: 0.71 ng/ml (ref 0.10–4.00)

## 2019-04-03 LAB — LDL CHOLESTEROL, DIRECT: Direct LDL: 81 mg/dL

## 2019-04-03 NOTE — Progress Notes (Signed)
PCP notes:  Health Maintenance:  No gaps: Declines flu vaccine  Abnormal Screenings:  None  Patient concerns:  None  Nurse concerns:  None  Next PCP appt.: 04/10/2019 at 2:15

## 2019-04-03 NOTE — Progress Notes (Signed)
Subjective:   William Hebert is a 73 y.o. male who presents for Medicare Annual/Subsequent preventive examination.  This visit type was conducted due to national recommendations for restrictions regarding the COVID-19 Pandemic (e.g. social distancing). This format is felt to be most appropriate for this patient at this time. All issues noted in this document were discussed and addressed. No physical exam was performed (except for noted visual exam findings with Video Visits). This patient, William Hebert, has given permission to perform this visit via telephone. Vital signs may be absent or patient reported.  Patient location:  At home  Nurse location:  At home     Review of Systems:  n/a Cardiac Risk Factors include: advanced age (>45men, >2 women);male gender;hypertension;dyslipidemia;obesity (BMI >30kg/m2);diabetes mellitus     Objective:    Vitals: BP 140/82 Comment: per patient  Pulse 72 Comment: per patient  Ht 5\' 10"  (1.778 m) Comment: per patient  Wt (!) 330 lb (149.7 kg) Comment: per patient  BMI 47.35 kg/m   Body mass index is 47.35 kg/m.  Advanced Directives 04/03/2019 03/16/2018 07/08/2017 07/06/2017 02/23/2017 02/19/2017 11/25/2016  Does Patient Have a Medical Advance Directive? No No No No No No No  Does patient want to make changes to medical advance directive? - - No - Patient declined - - - -  Would patient like information on creating a medical advance directive? - No - Patient declined No - Patient declined No - Patient declined No - Patient declined No - Patient declined -    Tobacco Social History   Tobacco Use  Smoking Status Former Smoker  . Packs/day: 1.50  . Years: 51.00  . Pack years: 76.50  . Types: Cigarettes  . Quit date: 2013  . Years since quitting: 7.5  Smokeless Tobacco Former Systems developer  . Types: Snuff, Chew  . Quit date: 07/06/2006     Counseling given: Not Answered   Clinical Intake:  Pre-visit preparation completed: Yes  Pain :  No/denies pain     Nutritional Status: BMI > 30  Obese Nutritional Risks: None Diabetes: Yes CBG done?: No Did pt. bring in CBG monitor from home?: No  How often do you need to have someone help you when you read instructions, pamphlets, or other written materials from your doctor or pharmacy?: 1 - Never What is the last grade level you completed in school?: 12th grade  Interpreter Needed?: No  Information entered by :: NAllen LPN  Past Medical History:  Diagnosis Date  . AAA (abdominal aortic aneurysm) without rupture ([facility]) 07/10/2014   S/p endovascular repair 2012 with stent Kellie Simmering), yearly f/u  . Arthritis   . Cancer ([place])    skin cancer non melanioma  . Chronic bronchitis ([place]) 11/2016 AND 10-2016  . COPD (chronic obstructive pulmonary disease) ([facility])    chronic bronchitis  . GERD (gastroesophageal reflux disease)   . Headache(784.0)    in 20's  . Hepatitis    Carrier cant give blood gives a false positive  . Hernia of abdominal wall    LARGE FROM BREAST BONE AREA TO BELLY BUTTON  . History of skin cancer 2014  . HTN (hypertension)   . Hyperlipidemia   . Hypogonadism in male    prior on testosterone cream  . Morbidly obese ([place])   . Pneumonia   . PONV (postoperative nausea and vomiting)    PONV AFTER AAA REPAIR, ALL OTHER SURGERIES WENT OK  . Posterior vitreous detachment 02/2018  . Shortness of breath  WITH EXERTION  . Sleep apnea    CPAP    Past Surgical History:  Procedure Laterality Date  . ABDOMINAL AORTIC ANEURYSM REPAIR  05/20/2011   enodvascular aorto bifem. stent graft Kellie Simmering)  . CATARACT EXTRACTION Bilateral 2014   X 2 BOTH EYES DONE TWICE  . COLONOSCOPY WITH PROPOFOL N/A 02/23/2017   4 TA polyps, rpt 3-5 yrs ([place])  . ELBOW SURGERY  2012  . FOOT TENDON SURGERY Right   . KNEE SURGERY Right remote   arthroscopy x2  . MASS EXCISION N/A 05/10/2013   Procedure: EXCISION back MASS - cyst Marcello Moores A. Cornett, MD)  . SHOULDER OPEN ROTATOR CUFF  REPAIR Right 07/08/2017   Procedure: Right shoulder mini open rotator cuff repair;  Surgeon: Susa Day, MD;  Location: [facility];  Service: Orthopedics;  Laterality: Right;  90 mins; Interscalene Block  . SKIN SURGERY     2 different surgeries face   . UMBILICAL HERNIA REPAIR  2002   Family History  Problem Relation Age of Onset  . Cancer Mother 65       throat (nonsmoker)  . Heart disease Maternal Aunt   . Alcohol abuse Father        deceased 43yo   Social History   Socioeconomic History  . Marital status: Married    Spouse name: Not on file  . Number of children: 3  . Years of education: Not on file  . Highest education level: Not on file  Occupational History  . Occupation: Air traffic controller.   . Occupation: works 6 months a year  Social Needs  . Financial resource strain: Not hard at all  . Food insecurity    Worry: Never true    Inability: Never true  . Transportation needs    Medical: No    Non-medical: No  Tobacco Use  . Smoking status: Former Smoker    Packs/day: 1.50    Years: 51.00    Pack years: 76.50    Types: Cigarettes    Quit date: 2013    Years since quitting: 7.5  . Smokeless tobacco: Former Systems developer    Types: Snuff, Sarina Ser    Quit date: 07/06/2006  Substance and Sexual Activity  . Alcohol use: Not Currently    Alcohol/week: 0.0 standard drinks  . Drug use: No  . Sexual activity: Yes    Partners: Female  Lifestyle  . Physical activity    Days per week: 3 days    Minutes per session: 60 min  . Stress: Not at all  Relationships  . Social Herbalist on phone: Not on file    Gets together: Not on file    Attends religious service: Not on file    Active member of club or organization: Not on file    Attends meetings of clubs or organizations: Not on file    Relationship status: Not on file  Other Topics Concern  . 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 albuterol (PROVENTIL HFA;VENTOLIN HFA) 108 (90 Base) MCG/ACT inhaler Inhale 1-2 puffs into the lungs every 4 (four) hours as needed for wheezing or shortness of breath.  . Ascorbic Acid (VITAMIN C PO) Take 1,000  mg by mouth daily.  [name] aspirin 81 MG chewable tablet Chew daily by mouth.  . Cholecalciferol (VITAMIN D3) 1.25 MG (50000 UT) CAPS TAKE 50,000 UNITS (1 CAPSULE) BY MOUTH EVERY FRIDAY. IN THE MORNING.  . furosemide (LASIX) 40 MG tablet TAKE 1 TABLET BY MOUTH TWO  TIMES DAILY  . labetalol (NORMODYNE) 200 MG tablet TAKE 1 TABLET BY MOUTH  DAILY  . Multiple Vitamins-Minerals (MULTIVITAMIN ADULT PO) Take 1 tablet by mouth daily. MEN'S 50+ MULTIVITAMIN.  [name] NIFEdipine (PROCARDIA XL/ADALAT-CC) 60 MG 24 hr tablet TAKE 1 TABLET BY MOUTH  DAILY  . omeprazole (PRILOSEC) 40 MG capsule Take 1 capsule (40 mg total) by mouth daily.  . potassium chloride SA (K-DUR,KLOR-CON) 20 MEQ tablet TAKE 1 TABLET BY MOUTH  DAILY  . Turmeric 500 MG TABS Take 1 tablet by mouth daily.  . valsartan (DIOVAN) 80 MG tablet Take 1 tablet (80 mg total) by mouth daily.  . valsartan-hydrochlorothiazide (DIOVAN-HCT) 80-12.5 MG tablet Take 1 tablet by mouth daily.  . Zinc 100 MG TABS Take 1 tablet by mouth daily.  . methocarbamol (ROBAXIN) 500 MG tablet Take 1-2 tablets (500-1,000 mg total) by mouth 2 (two) times daily as needed for muscle spasms (sedation precautions). (Patient not taking: Reported on 04/03/2019)  . rosuvastatin (CRESTOR) 20 MG tablet Take 1 tablet (20 mg total) by mouth daily. (Patient not taking: Reported on 04/03/2019)   No facility-administered encounter medications on file as of 04/03/2019.     Activities of Daily Living In your present state of health, do you have any difficulty performing the following activities: 04/03/2019   Hearing? N  Vision? N  Difficulty concentrating or making decisions? N  Walking or climbing stairs? N  Dressing or bathing? N  Doing errands, shopping? N  Preparing Food and eating ? N  Using the Toilet? N  In the past six months, have you accidently leaked urine? N  Do you have problems with loss of bowel control? N  Managing your Medications? N  Managing your Finances? N  Housekeeping or managing your Housekeeping? N  Some recent data might be hidden    Patient Care Team: Ria Bush, MD as PCP - General (Family Medicine) Adrian Prows, MD (Cardiology)   Assessment:   This is a routine wellness examination for William Hebert.  Exercise Activities and Dietary recommendations Current Exercise Habits: Home exercise routine, Time (Minutes): 60, Frequency (Times/Week): 3, Weekly Exercise (Minutes/Week): 180  Goals    . Increase physical activity     Weather permitting, I will continue to swim for 60 minutes daily.     . Patient Stated     04/03/2019, no goals    . physical     When weather permits, I resume yard work at least 120 min once weekly.        Fall Risk Fall Risk  04/03/2019 03/16/2018 11/25/2016 01/23/2016 09/10/2015  Falls in the past year? 0 No No No No  Risk for fall due to : Medication side effect - - - -  Follow up Falls evaluation completed;Falls prevention discussed - - - -   Is the patient's home free of loose throw rugs in walkways, pet beds, electrical cords, etc?   yes      Grab bars in the bathroom? yes      Handrails on the stairs? n/a      Adequate lighting?   yes  Timed Get Up and Go Performed: n/a  Depression Screen [facility] 2/9 Scores 04/03/2019 03/16/2018 11/25/2016  01/23/2016  PHQ - 2 Score 0 0 0 0  PHQ- 9 Score 0 0 - -    Cognitive Function MMSE - Mini Mental State Exam 04/03/2019 03/16/2018 11/25/2016  Orientation to time 5 5 5   Orientation to Place 5 5 5   Registration 3 3 3   Attention/ Calculation 5 0 0  Recall 3 3 3   Language- name 2 objects 0 0 0   Language- repeat 1 1 1   Language- follow 3 step command 0 3 3  Language- read & follow direction 0 0 0  Write a sentence 0 0 0  Copy design 0 0 0  Total score 22 20 20    Mini Cog  Mini-Cog screen was completed. Maximum score is 22. A value of 0 denotes this part of the MMSE was not completed or the patient failed this part of the Mini-Cog screening.       Immunization History  Administered Date(s) Administered  . Pneumococcal Conjugate-13 03/06/2014  . Pneumococcal Polysaccharide-23 09/10/2015  . Td 03/06/2014    Qualifies for Shingles Vaccine? yes  Screening Tests Health Maintenance  Topic Date Due  . INFLUENZA VACCINE  04/01/2019  . DTaP/Tdap/Td (1 - Tdap) 03/06/2024 (Originally 10/06/1964)  . COLONOSCOPY  02/23/2022  . TETANUS/TDAP  03/06/2024  . Hepatitis C Screening  Completed  . PNA vac Low Risk Adult  Completed   Cancer Screenings: Lung: Low Dose CT Chest recommended if Age 58-80 years, 30 pack-year currently smoking OR have quit w/in 15years. Patient does not qualify. Colorectal: up to date  Additional Screenings:  Hepatitis C Screening:09/2015      Plan:    Patient has no goals for this year.  I have personally reviewed and noted the following in the patient's chart:   . Medical and social history . Use of alcohol, tobacco or illicit drugs  . Current medications and supplements . Functional ability and status . Nutritional status . Physical activity . Advanced directives . List of other physicians . Hospitalizations, surgeries, and ER visits in previous 12 months . Vitals . Screenings to include cognitive, depression, and falls . Referrals and appointments  In addition, I have reviewed and discussed with patient certain preventive protocols, quality metrics, and best practice recommendations. A written personalized care plan for preventive services as well as general preventive health recommendations were provided to patient.     Kellie Simmering,  LPN  11/30/7060

## 2019-04-03 NOTE — Patient Instructions (Signed)
William Hebert , Thank you for taking time to come for your Medicare Wellness Visit. I appreciate your ongoing commitment to your health goals. Please review the following plan we discussed and let me know if I can assist you in the future.   Screening recommendations/referrals: Colonoscopy: 01/2017 Recommended yearly ophthalmology/optometry visit for glaucoma screening and checkup Recommended yearly dental visit for hygiene and checkup  Vaccinations: Influenza vaccine: declines Pneumococcal vaccine: 09/2015 Tdap vaccine: 02/2014 Shingles vaccine: discussed    Advanced directives: Advance directive discussed with you today.  Conditions/risks identified: obesity  Next appointment: 04/10/2019 at 2:15  Preventive Care 16 Years and Older, Male Preventive care refers to lifestyle choices and visits with your health care provider that can promote health and wellness. What does preventive care include?  A yearly physical exam. This is also called an annual well check.  Dental exams once or twice a year.  Routine eye exams. Ask your health care provider how often you should have your eyes checked.  Personal lifestyle choices, including:  Daily care of your teeth and gums.  Regular physical activity.  Eating a healthy diet.  Avoiding tobacco and drug use.  Limiting alcohol use.  Practicing safe sex.  Taking low doses of aspirin every day.  Taking vitamin and mineral supplements as recommended by your health care provider. What happens during an annual well check? The services and screenings done by your health care provider during your annual well check will depend on your age, overall health, lifestyle risk factors, and family history of disease. Counseling  Your health care provider may ask you questions about your:  Alcohol use.  Tobacco use.  Drug use.  Emotional well-being.  Home and relationship well-being.  Sexual activity.  Eating habits.  History of falls.   Memory and ability to understand (cognition).  Work and work Statistician. Screening  You may have the following tests or measurements:  Height, weight, and BMI.  Blood pressure.  Lipid and cholesterol levels. These may be checked every 5 years, or more frequently if you are over 71 years old.  Skin check.  Lung cancer screening. You may have this screening every year starting at age 55 if you have a 30-pack-year history of smoking and currently smoke or have quit within the past 15 years.  Fecal occult blood test (FOBT) of the stool. You may have this test every year starting at age 91.  Flexible sigmoidoscopy or colonoscopy. You may have a sigmoidoscopy every 5 years or a colonoscopy every 10 years starting at age 77.  Prostate cancer screening. Recommendations will vary depending on your family history and other risks.  Hepatitis C blood test.  Hepatitis B blood test.  Sexually transmitted disease (STD) testing.  Diabetes screening. This is done by checking your blood sugar (glucose) after you have not eaten for a while (fasting). You may have this done every 1-3 years.  Abdominal aortic aneurysm (AAA) screening. You may need this if you are a current or former smoker.  Osteoporosis. You may be screened starting at age 34 if you are at high risk. Talk with your health care provider about your test results, treatment options, and if necessary, the need for more tests. Vaccines  Your health care provider may recommend certain vaccines, such as:  Influenza vaccine. This is recommended every year.  Tetanus, diphtheria, and acellular pertussis (Tdap, Td) vaccine. You may need a Td booster every 10 years.  Zoster vaccine. You may need this after age 9.  Pneumococcal 13-valent conjugate (PCV13) vaccine. One dose is recommended after age 21.  Pneumococcal polysaccharide (PPSV23) vaccine. One dose is recommended after age 71. Talk to your health care provider about which  screenings and vaccines you need and how often you need them. This information is not intended to replace advice given to you by your health care provider. Make sure you discuss any questions you have with your health care provider. Document Released: 09/13/2015 Document Revised: 05/06/2016 Document Reviewed: 06/18/2015 Elsevier Interactive Patient Education  2017 [place] Prevention in the Home Falls can cause injuries. They can happen to people of all ages. There are many things you can do to make your home safe and to help prevent falls. What can I do on the outside of my home?  Regularly fix the edges of walkways and driveways and fix any cracks.  Remove anything that might make you trip as you walk through a door, such as a raised step or threshold.  Trim any bushes or trees on the path to your home.  Use bright outdoor lighting.  Clear any walking paths of anything that might make someone trip, such as rocks or tools.  Regularly check to see if handrails are loose or broken. Make sure that both sides of any steps have handrails.  Any raised decks and porches should have guardrails on the edges.  Have any leaves, snow, or ice cleared regularly.  Use sand or salt on walking paths during winter.  Clean up any spills in your garage right away. This includes oil or grease spills. What can I do in the bathroom?  Use night lights.  Install grab bars by the toilet and in the tub and shower. Do not use towel bars as grab bars.  Use non-skid mats or decals in the tub or shower.  If you need to sit down in the shower, use a plastic, non-slip stool.  Keep the floor dry. Clean up any water that spills on the floor as soon as it happens.  Remove soap buildup in the tub or shower regularly.  Attach bath mats securely with double-sided non-slip rug tape.  Do not have throw rugs and other things on the floor that can make you trip. What can I do in the bedroom?  Use  night lights.  Make sure that you have a light by your bed that is easy to reach.  Do not use any sheets or blankets that are too big for your bed. They should not hang down onto the floor.  Have a firm chair that has side arms. You can use this for support while you get dressed.  Do not have throw rugs and other things on the floor that can make you trip. What can I do in the kitchen?  Clean up any spills right away.  Avoid walking on wet floors.  Keep items that you use a lot in easy-to-reach places.  If you need to reach something above you, use a strong step stool that has a grab bar.  Keep electrical cords out of the way.  Do not use floor polish or wax that makes floors slippery. If you must use wax, use non-skid floor wax.  Do not have throw rugs and other things on the floor that can make you trip. What can I do with my stairs?  Do not leave any items on the stairs.  Make sure that there are handrails on both sides of the stairs and use them.  Fix handrails that are broken or loose. Make sure that handrails are as long as the stairways.  Check any carpeting to make sure that it is firmly attached to the stairs. Fix any carpet that is loose or worn.  Avoid having throw rugs at the top or bottom of the stairs. If you do have throw rugs, attach them to the floor with carpet tape.  Make sure that you have a light switch at the top of the stairs and the bottom of the stairs. If you do not have them, ask someone to add them for you. What else can I do to help prevent falls?  Wear shoes that:  Do not have high heels.  Have rubber bottoms.  Are comfortable and fit you well.  Are closed at the toe. Do not wear sandals.  If you use a stepladder:  Make sure that it is fully opened. Do not climb a closed stepladder.  Make sure that both sides of the stepladder are locked into place.  Ask someone to hold it for you, if possible.  Clearly mark and make sure that you  can see:  Any grab bars or handrails.  First and last steps.  Where the edge of each step is.  Use tools that help you move around (mobility aids) if they are needed. These include:  Canes.  Walkers.  Scooters.  Crutches.  Turn on the lights when you go into a dark area. Replace any light bulbs as soon as they burn out.  Set up your furniture so you have a clear path. Avoid moving your furniture around.  If any of your floors are uneven, fix them.  If there are any pets around you, be aware of where they are.  Review your medicines with your doctor. Some medicines can make you feel dizzy. This can increase your chance of falling. Ask your doctor what other things that you can do to help prevent falls. This information is not intended to replace advice given to you by your health care provider. Make sure you discuss any questions you have with your health care provider. Document Released: 06/13/2009 Document Revised: 01/23/2016 Document Reviewed: 09/21/2014 Elsevier Interactive Patient Education  2017 [employer].

## 2019-04-05 LAB — ZINC: Zinc: 117 ug/dL (ref 60–130)

## 2019-04-10 ENCOUNTER — Other Ambulatory Visit: Payer: Self-pay

## 2019-04-10 ENCOUNTER — Encounter: Payer: Self-pay | Admitting: Family Medicine

## 2019-04-10 ENCOUNTER — Ambulatory Visit (INDEPENDENT_AMBULATORY_CARE_PROVIDER_SITE_OTHER): Payer: Medicare Other | Admitting: Family Medicine

## 2019-04-10 VITALS — BP 148/70 | HR 64 | Temp 97.7°F | Ht 71.0 in | Wt 338.3 lb

## 2019-04-10 DIAGNOSIS — Z0000 Encounter for general adult medical examination without abnormal findings: Secondary | ICD-10-CM | POA: Diagnosis not present

## 2019-04-10 DIAGNOSIS — Z20822 Contact with and (suspected) exposure to covid-19: Secondary | ICD-10-CM | POA: Insufficient documentation

## 2019-04-10 DIAGNOSIS — I714 Abdominal aortic aneurysm, without rupture, unspecified: Secondary | ICD-10-CM

## 2019-04-10 DIAGNOSIS — Z20828 Contact with and (suspected) exposure to other viral communicable diseases: Secondary | ICD-10-CM | POA: Diagnosis not present

## 2019-04-10 DIAGNOSIS — I10 Essential (primary) hypertension: Secondary | ICD-10-CM

## 2019-04-10 DIAGNOSIS — E559 Vitamin D deficiency, unspecified: Secondary | ICD-10-CM

## 2019-04-10 DIAGNOSIS — Z7189 Other specified counseling: Secondary | ICD-10-CM

## 2019-04-10 DIAGNOSIS — Z6841 Body Mass Index (BMI) 40.0 and over, adult: Secondary | ICD-10-CM

## 2019-04-10 DIAGNOSIS — E785 Hyperlipidemia, unspecified: Secondary | ICD-10-CM

## 2019-04-10 DIAGNOSIS — G4733 Obstructive sleep apnea (adult) (pediatric): Secondary | ICD-10-CM

## 2019-04-10 DIAGNOSIS — Z87891 Personal history of nicotine dependence: Secondary | ICD-10-CM

## 2019-04-10 DIAGNOSIS — Z9889 Other specified postprocedural states: Secondary | ICD-10-CM

## 2019-04-10 DIAGNOSIS — Z8679 Personal history of other diseases of the circulatory system: Secondary | ICD-10-CM

## 2019-04-10 DIAGNOSIS — E118 Type 2 diabetes mellitus with unspecified complications: Secondary | ICD-10-CM

## 2019-04-10 MED ORDER — ZINC 100 MG PO TABS: 1.0000 | ORAL_TABLET | Freq: Every day | ORAL | 3 refills | Status: AC

## 2019-04-10 NOTE — Assessment & Plan Note (Signed)
Previously followed by [facility], latest CTA ordered by cardiology.

## 2019-04-10 NOTE — Assessment & Plan Note (Signed)
Mild deterioration noted. Reviewed with patient. Encouraged diabetic diet.

## 2019-04-10 NOTE — Assessment & Plan Note (Signed)

## 2019-04-10 NOTE — Assessment & Plan Note (Signed)
Continue vit D replacement.  

## 2019-04-10 NOTE — Progress Notes (Signed)
This visit was conducted in person.  BP (!) 148/70 (BP Location: Right Arm, Patient Position: Sitting, Cuff Size: Large)   Pulse 64   Temp 97.7 F (36.5 C) (Temporal)   Ht 5\' 11"  (1.803 m)   Wt (!) 338 lb 5 oz (153.5 kg)   SpO2 98%   BMI 47.19 kg/m    CC: CPE/AMW Subjective:    Patient ID: William Hebert, male    DOB: November 30, 1945, 73 y.o.   MRN: [id number]  HPI: William Hebert is a 73 y.o. male presenting on 04/10/2019 for Annual Exam (Prt 2.  Pt requesting COVID test.  Denies any sxs. )   Saw health advisor last week for medicare wellness visit. Note reviewed.    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 He largely works in [employer] with another partner. Patient requests Covid testing - discussed he is not high risk, he still desires to be tested.   Preventative: COLONOSCOPY WITH PROPOFOL 02/23/2017 - 4 TA polyps, rpt 3-5 yrs ([place]) Prostate cancer screening -declines DRE. Lung cancer screening -eligible, has declined.  Flu shot -not done  Td 2015 Pneumovax 2017, prevnar 2015 Shingrix - declines - has had shingles  Advanced directive discussion -would want daughter [name] to be HCPOA - also would want wife. Packet previously provided - encouraged he work on this.  Seat belt use discussed Sunscreen usediscussed. No changing moles on skin. Needs to see derm.  Ex smoker - quit ~2007. Prior smoked 2 ppd. ~75 PY hx Alcohol - rarely Dentist - Q54mo  Eye exam yearly  Lives with wife Occ: semi-retired Corporate treasurer, works for [employer] Edu: HS Activity: swimming 3x/wk Diet: no water,1/2 gallonsweet tea - decreasing amt sugar in it though, some fruits/vegetables      Relevant past medical, surgical, family and social history reviewed and updated as indicated. Interim medical history since our last visit reviewed. Allergies and medications reviewed and updated. Outpatient Medications Prior to Visit   Medication Sig Dispense Refill  . albuterol (PROVENTIL HFA;VENTOLIN HFA) 108 (90 Base) MCG/ACT inhaler Inhale 1-2 puffs into the lungs every 4 (four) hours as needed for wheezing or shortness of breath.    . Ascorbic Acid (VITAMIN C PO) Take 1,000 mg by mouth daily.    [name] aspirin 81 MG chewable tablet Chew daily by mouth.    . Cholecalciferol (VITAMIN D3) 1.25 MG (50000 UT) CAPS TAKE 50,000 UNITS (1 CAPSULE) BY MOUTH EVERY FRIDAY. IN THE MORNING. 12 capsule 0  . furosemide (LASIX) 40 MG tablet TAKE 1 TABLET BY MOUTH TWO  TIMES DAILY 180 tablet 1  . labetalol (NORMODYNE) 200 MG tablet TAKE 1 TABLET BY MOUTH  DAILY 90 tablet 2  . methocarbamol (ROBAXIN) 500 MG tablet Take 1-2 tablets (500-1,000 mg total) by mouth 2 (two) times daily as needed for muscle spasms (sedation precautions). 40 tablet 0  . Multiple Vitamins-Minerals (MULTIVITAMIN ADULT PO) Take 1 tablet by mouth daily. MEN'S 50+ MULTIVITAMIN.    [name] NIFEdipine (PROCARDIA XL/ADALAT-CC) 60 MG 24 hr tablet TAKE 1 TABLET BY MOUTH  DAILY 90 tablet 2  . omeprazole (PRILOSEC) 40 MG capsule Take 1 capsule (40 mg total) by mouth daily. 90 capsule 3  . potassium chloride SA (K-DUR,KLOR-CON) 20 MEQ tablet TAKE 1 TABLET BY MOUTH  DAILY 90 tablet 2  . rosuvastatin (CRESTOR) 20 MG tablet Take 1 tablet (20 mg total) by mouth daily. 90 tablet 3  .  Turmeric 500 MG TABS Take 1 tablet by mouth daily.    . valsartan (DIOVAN) 80 MG tablet Take 1 tablet (80 mg total) by mouth daily. 90 tablet 3  . valsartan-hydrochlorothiazide (DIOVAN-HCT) 80-12.5 MG tablet Take 1 tablet by mouth daily.  6  . Zinc 100 MG TABS Take 1 tablet by mouth daily.     No facility-administered medications prior to visit.      Per HPI unless specifically indicated in ROS section below Review of Systems  Constitutional: Negative for activity change, appetite change, chills, fatigue, fever and unexpected weight change.  HENT: Negative for hearing loss.   Eyes: Negative for visual  disturbance.  Respiratory: Positive for shortness of breath (chronic). Negative for cough, chest tightness and wheezing.   Cardiovascular: Positive for leg swelling. Negative for chest pain and palpitations.  Gastrointestinal: Negative for abdominal distention, abdominal pain, blood in stool, constipation, diarrhea, nausea and vomiting.  Genitourinary: Negative for difficulty urinating and hematuria.  Musculoskeletal: Negative for arthralgias, myalgias and neck pain.  Skin: Negative for rash.  Neurological: Negative for dizziness, seizures, syncope and headaches.  Hematological: Negative for adenopathy. Does not bruise/bleed easily.  Psychiatric/Behavioral: Negative for dysphoric mood. The patient is not nervous/anxious.    Objective:    BP (!) 148/70 (BP Location: Right Arm, Patient Position: Sitting, Cuff Size: Large)   Pulse 64   Temp 97.7 F (36.5 C) (Temporal)   Ht 5\' 11"  (1.803 m)   Wt (!) 338 lb 5 oz (153.5 kg)   SpO2 98%   BMI 47.19 kg/m   Wt Readings from Last 3 Encounters:  04/10/19 (!) 338 lb 5 oz (153.5 kg)  04/03/19 (!) 330 lb (149.7 kg)  03/08/19 (!) 339 lb (153.8 kg)    Physical Exam Vitals signs and nursing note reviewed.  Constitutional:      General: He is not in acute distress.    Appearance: Normal appearance. He is well-developed. He is not ill-appearing.  HENT:     Head: Normocephalic and atraumatic.     Right Ear: Hearing, tympanic membrane, ear canal and external ear normal.     Left Ear: Hearing, tympanic membrane, ear canal and external ear normal.     Nose: Nose normal.     Mouth/Throat:     Mouth: Mucous membranes are moist.     Pharynx: Uvula midline. No oropharyngeal exudate or posterior oropharyngeal erythema.  Eyes:     General: No scleral icterus.    Extraocular Movements: Extraocular movements intact.     Conjunctiva/sclera: Conjunctivae normal.     Pupils: Pupils are equal, round, and reactive to light.  Neck:     Musculoskeletal:  Normal range of motion and neck supple.  Cardiovascular:     Rate and Rhythm: Normal rate and regular rhythm.     Pulses: Normal pulses.          Radial pulses are 2+ on the right side and 2+ on the left side.     Heart sounds: Normal heart sounds. No murmur.  Pulmonary:     Effort: Pulmonary effort is normal. No respiratory distress.     Breath sounds: Normal breath sounds. No wheezing, rhonchi or rales.  Abdominal:     General: Abdomen is flat. Bowel sounds are normal. There is no distension.     Palpations: Abdomen is soft. There is no mass.     Tenderness: There is no abdominal tenderness. There is no guarding or rebound.     Hernia: No hernia  is present.  Musculoskeletal: Normal range of motion.  Lymphadenopathy:     Cervical: No cervical adenopathy.  Skin:    General: Skin is warm and dry.     Findings: No rash.  Neurological:     General: No focal deficit present.     Mental Status: He is alert and oriented to person, place, and time.     Comments: CN grossly intact, station and gait intact  Psychiatric:        Mood and Affect: Mood normal.        Behavior: Behavior normal.        Thought Content: Thought content normal.        Judgment: Judgment normal.       Results for orders placed or performed in visit on 04/03/19  Zinc  Result Value Ref Range   Zinc 117 60 - 130 mcg/dL  PSA, Medicare  Result Value Ref Range   PSA 0.71 0.10 - 4.00 ng/ml  VITAMIN D 25 Hydroxy (Vit-D Deficiency, Fractures)  Result Value Ref Range   VITD 41.02 30.00 - 100.00 ng/mL  Hemoglobin A1c  Result Value Ref Range   Hgb A1c MFr Bld 7.1 (H) 4.6 - 6.5 %  Comprehensive metabolic panel  Result Value Ref Range   Sodium 141 135 - 145 mEq/L   Potassium 3.4 (L) 3.5 - 5.1 mEq/L   Chloride 103 96 - 112 mEq/L   CO2 28 19 - 32 mEq/L   Glucose, Bld 133 (H) 70 - 99 mg/dL   BUN 13 6 - 23 mg/dL   Creatinine, Ser 1.04 0.40 - 1.50 mg/dL   Total Bilirubin 0.3 0.2 - 1.2 mg/dL   Alkaline Phosphatase  74 39 - 117 U/L   AST 16 0 - 37 U/L   ALT 19 0 - 53 U/L   Total Protein 5.9 (L) 6.0 - 8.3 g/dL   Albumin 3.8 3.5 - 5.2 g/dL   Calcium 9.3 8.4 - 10.5 mg/dL   GFR 70.30 >60.00 mL/min  Lipid panel  Result Value Ref Range   Cholesterol 177 0 - 200 mg/dL   Triglycerides 395.0 (H) 0.0 - 149.0 mg/dL   HDL 28.00 (L) >39.00 mg/dL   VLDL 79.0 (H) 0.0 - 40.0 mg/dL   Total CHOL/HDL Ratio 6    NonHDL 149.06   LDL cholesterol, direct  Result Value Ref Range   Direct LDL 81.0 mg/dL   Assessment & Plan:   Problem List Items Addressed This Visit    Vitamin D deficiency    Continue vit D replacement.       S/P AAA (abdominal aortic aneurysm) repair   Obstructive sleep apnea    Continue CPAP use.       Medicare annual wellness visit, subsequent - Primary    I have personally reviewed the Medicare Annual Wellness questionnaire and have noted 1. The patient's medical and social history 2. Their use of alcohol, tobacco or illicit drugs 3. Their current medications and supplements 4. The patient's functional ability including ADL's, fall risks, home safety risks and hearing or visual impairment. Cognitive function has been assessed and addressed as indicated.  5. Diet and physical activity 6. Evidence for depression or mood disorders The patients weight, height, BMI have been recorded in the chart. I have made referrals, counseling and provided education to the patient based on review of the above and I have provided the pt with a written personalized care plan for preventive services. Provider list updated.. See scanned questionairre  as needed for further documentation. Reviewed preventative protocols and updated unless pt declined.       Hypertension    Chronic, mildly elevated. No changes made today.  For hypokalemia - encouraged regular Kdur use.       Health maintenance examination    Preventative protocols reviewed and updated unless pt declined. Discussed healthy diet and  lifestyle.       Exposure to Covid-19 Virus    Possible exposure while at work, doubtful direct exposure however. He is high risk for complications from infection and desires testing - will refer.       Relevant Orders   Novel Coronavirus, NAA ([facility])   Ex-smoker    Declines lung cancer screening program referral.       Dyslipidemia    Off crestor currently through cardiology - he never called back with an update. Advised call cardiology for f/u plan.  The 10-year ASCVD risk score Mikey Bussing DC Brooke Bonito., et al., 2013) is: 53.7%   Values used to calculate the score:     Age: 37 years     Sex: Male     Is Non-Hispanic African American: No     Diabetic: Yes     Tobacco smoker: No     Systolic Blood Pressure: 480 mmHg     Is BP treated: Yes     HDL Cholesterol: 28 mg/dL     Total Cholesterol: 177 mg/dL       Controlled diabetes mellitus type 2 with complications (HCC)    Mild deterioration noted. Reviewed with patient. Encouraged diabetic diet.       Body mass index (BMI) of 45.0 to 49.9 in adult [facility])    Continue to encourage healthy diet and lifestyle changes to affect sustainable weight loss. Encouraged he continue regular swimming routine.       Advanced care planning/counseling discussion    Advanced directive discussion -would want daughter [name] to be HCPOA - also would want wife. Packet previously provided - encouraged he work on this.       AAA (abdominal aortic aneurysm) without rupture (HCC)    Previously followed by [facility], latest CTA ordered by cardiology.           Meds ordered this encounter  Medications  . Zinc 100 MG TABS    Sig: Take 1 tablet (100 mg total) by mouth daily.    Dispense:  90 tablet    Refill:  3   Orders Placed This Encounter  Procedures  . Novel Coronavirus, NAA ([facility])    Order Specific Question:   Is this test for diagnosis or screening    Answer:   Screening    Order Specific Question:   Symptomatic for COVID-19 as defined by  CDC    Answer:   No    Order Specific Question:   Hospitalized for COVID-19    Answer:   No    Order Specific Question:   Admitted to ICU for COVID-19    Answer:   No    Order Specific Question:   Previously tested for COVID-19    Answer:   No    Order Specific Question:   Resident in a congregate (group) care setting    Answer:   No    Order Specific Question:   Employed in healthcare setting    Answer:   No    Follow up plan: Return in about 6 months (around 10/11/2019) for follow up visit.  Ria Bush, MD

## 2019-04-10 NOTE — Assessment & Plan Note (Signed)
Continue CPAP use

## 2019-04-10 NOTE — Assessment & Plan Note (Signed)
Advanced directive discussion - would want daughter Leah Cottle to be HCPOA - also would want wife. Packet previously provided - encouraged he work on this.  

## 2019-04-10 NOTE — Assessment & Plan Note (Signed)
Continue to encourage healthy diet and lifestyle changes to affect sustainable weight loss. Encouraged he continue regular swimming routine.

## 2019-04-10 NOTE — Assessment & Plan Note (Addendum)
Chronic, mildly elevated. No changes made today.  For hypokalemia - encouraged regular Kdur use.

## 2019-04-10 NOTE — Assessment & Plan Note (Addendum)
Off crestor currently through cardiology - he never called back with an update. Advised call cardiology for f/u plan.  The 10-year ASCVD risk score William Hebert DC William Hebert., et al., 2013) is: 53.7%   Values used to calculate the score:     Age: 73 years     Sex: Male     Is Non-Hispanic African American: No     Diabetic: Yes     Tobacco smoker: No     Systolic Blood Pressure: 937 mmHg     Is BP treated: Yes     HDL Cholesterol: 28 mg/dL     Total Cholesterol: 177 mg/dL

## 2019-04-10 NOTE — Patient Instructions (Addendum)
Call cardiology for update on muscles aches off crestor for plan.  Work on advanced directive and living will, bring me copy when complete.  Go get tested for Covid at [facility] center (test ordered today).  Sugar was elevated - A1c up to 7.1%. Continue working on diabetic diet, goal weight loss. Let me know if you change your mind about diabetes medicine.  Return in 6 months for diabetes follow up visit.   Health Maintenance After Age 71 After age 81, you are at a higher risk for certain long-term diseases and infections as well as injuries from falls. Falls are a major cause of broken bones and head injuries in people who are older than age 63. Getting regular preventive care can help to keep you healthy and well. Preventive care includes getting regular testing and making lifestyle changes as recommended by your health care provider. Talk with your health care provider about:  Which screenings and tests you should have. A screening is a test that checks for a disease when you have no symptoms.  A diet and exercise plan that is right for you. What should I know about screenings and tests to prevent falls? Screening and testing are the best ways to find a health problem early. Early diagnosis and treatment give you the best chance of managing medical conditions that are common after age 80. Certain conditions and lifestyle choices may make you more likely to have a fall. Your health care provider may recommend:  Regular vision checks. Poor vision and conditions such as cataracts can make you more likely to have a fall. If you wear glasses, make sure to get your prescription updated if your vision changes.  Medicine review. Work with your health care provider to regularly review all of the medicines you are taking, including over-the-counter medicines. Ask your health care provider about any side effects that may make you more likely to have a fall. Tell your health care provider if any medicines  that you take make you feel dizzy or sleepy.  Osteoporosis screening. Osteoporosis is a condition that causes the bones to get weaker. This can make the bones weak and cause them to break more easily.  Blood pressure screening. Blood pressure changes and medicines to control blood pressure can make you feel dizzy.  Strength and balance checks. Your health care provider may recommend certain tests to check your strength and balance while standing, walking, or changing positions.  Foot health exam. Foot pain and numbness, as well as not wearing proper footwear, can make you more likely to have a fall.  Depression screening. You may be more likely to have a fall if you have a fear of falling, feel emotionally low, or feel unable to do activities that you used to do.  Alcohol use screening. Using too much alcohol can affect your balance and may make you more likely to have a fall. What actions can I take to lower my risk of falls? General instructions  Talk with your health care provider about your risks for falling. Tell your health care provider if: ? You fall. Be sure to tell your health care provider about all falls, even ones that seem minor. ? You feel dizzy, sleepy, or off-balance.  Take over-the-counter and prescription medicines only as told by your health care provider. These include any supplements.  Eat a healthy diet and maintain a healthy weight. A healthy diet includes low-fat dairy products, low-fat (lean) meats, and fiber from whole grains, beans, and  lots of fruits and vegetables. Home safety  Remove any tripping hazards, such as rugs, cords, and clutter.  Install safety equipment such as grab bars in bathrooms and safety rails on stairs.  Keep rooms and walkways well-lit. Activity   Follow a regular exercise program to stay fit. This will help you maintain your balance. Ask your health care provider what types of exercise are appropriate for you.  If you need a cane  or walker, use it as recommended by your health care provider.  Wear supportive shoes that have nonskid soles. Lifestyle  Do not drink alcohol if your health care provider tells you not to drink.  If you drink alcohol, limit how much you have: ? 0-1 drink a day for women. ? 0-2 drinks a day for men.  Be aware of how much alcohol is in your drink. In the [place] one drink equals one typical bottle of beer (12 oz), one-half glass of wine (5 oz), or one shot of hard liquor (1 oz).  Do not use any products that contain nicotine or tobacco, such as cigarettes and e-cigarettes. If you need help quitting, ask your health care provider. Summary  Having a healthy lifestyle and getting preventive care can help to protect your health and wellness after age 58.  Screening and testing are the best way to find a health problem early and help you avoid having a fall. Early diagnosis and treatment give you the best chance for managing medical conditions that are more common for people who are older than age 3.  Falls are a major cause of broken bones and head injuries in people who are older than age 79. Take precautions to prevent a fall at home.  Work with your health care provider to learn what changes you can make to improve your health and wellness and to prevent falls. This information is not intended to replace advice given to you by your health care provider. Make sure you discuss any questions you have with your health care provider. Document Released: 06/30/2017 Document Revised: 12/08/2018 Document Reviewed: 06/30/2017 Elsevier Patient Education  2020 [employer].

## 2019-04-10 NOTE — Assessment & Plan Note (Signed)
Declines lung cancer screening program referral.

## 2019-04-10 NOTE — Assessment & Plan Note (Signed)
Possible exposure while at work, doubtful direct exposure however. He is high risk for complications from infection and desires testing - will refer.

## 2019-04-10 NOTE — Assessment & Plan Note (Signed)
Preventative protocols reviewed and updated unless pt declined. Discussed healthy diet and lifestyle.  

## 2019-04-11 ENCOUNTER — Other Ambulatory Visit: Payer: Self-pay

## 2019-04-11 DIAGNOSIS — Z20822 Contact with and (suspected) exposure to covid-19: Secondary | ICD-10-CM

## 2019-04-12 NOTE — Telephone Encounter (Signed)
Please read

## 2019-04-13 LAB — NOVEL CORONAVIRUS, NAA: SARS-CoV-2, NAA: NOT DETECTED

## 2019-04-13 NOTE — Telephone Encounter (Signed)
Please read

## 2019-04-22 ENCOUNTER — Other Ambulatory Visit: Payer: Self-pay | Admitting: Family Medicine

## 2019-04-24 NOTE — Telephone Encounter (Signed)
LOV was on 04/10/2019 for Wellness Visit

## 2019-05-19 ENCOUNTER — Telehealth: Payer: Self-pay | Admitting: *Deleted

## 2019-05-19 NOTE — Telephone Encounter (Signed)
Left message for patient to notify them that it is time to schedule annual low dose lung cancer screening CT scan. Instructed patient to call back to verify information prior to the scan being scheduled.  

## 2019-06-02 ENCOUNTER — Other Ambulatory Visit: Payer: Medicare Other

## 2019-06-05 ENCOUNTER — Ambulatory Visit
Admission: RE | Admit: 2019-06-05 | Discharge: 2019-06-05 | Disposition: A | Discharge status: Home / Self Care | Payer: Medicare Other | Source: Ambulatory Visit | Attending: Cardiology | Admitting: Cardiology

## 2019-06-05 DIAGNOSIS — Z9889 Other specified postprocedural states: Secondary | ICD-10-CM

## 2019-06-05 DIAGNOSIS — Z8679 Personal history of other diseases of the circulatory system: Secondary | ICD-10-CM

## 2019-06-05 MED ORDER — IOPAMIDOL (ISOVUE-370) INJECTION 76%
75.0000 mL | Freq: Once | INTRAVENOUS | Status: AC | PRN
  Administered 2019-06-05: 75 mL via INTRAVENOUS

## 2019-06-08 ENCOUNTER — Telehealth: Payer: Self-pay | Admitting: Family Medicine

## 2019-06-08 NOTE — Telephone Encounter (Signed)
Patient called requesting a RX for prescription strength Ibuprofen. He stated he has been having knee pain. And yesterday he took the prescription strength and for 6 hours the pain was gone but it is starting to come back now that he has not been able to take this   [facility] [place].

## 2019-06-09 MED ORDER — IBUPROFEN 600 MG PO TABS: 600.0000 mg | ORAL_TABLET | Freq: Three times a day (TID) | ORAL | 0 refills | Status: DC | PRN

## 2019-06-09 NOTE — Telephone Encounter (Signed)
Left detailed message on VM per DPR. 

## 2019-06-09 NOTE — Addendum Note (Signed)
Addended by: Ria Bush on: 06/09/2019 01:51 PM   Modules accepted: Orders

## 2019-06-09 NOTE — Telephone Encounter (Signed)
I have sent some in for him. He should use sparingly as it can raise BP and cause leg swelling, and can be heavy on stomach (increase GI bleed risk). Don't take with aspirin as it won't let aspirin work well.   Lab Results  Component Value Date   CREATININE 1.04 04/03/2019   BUN 13 04/03/2019   NA 141 04/03/2019   K 3.4 (L) 04/03/2019   CL 103 04/03/2019   CO2 28 04/03/2019    BP Readings from Last 3 Encounters:  04/10/19 (!) 148/70  04/03/19 140/82  03/08/19 140/82

## 2019-06-09 NOTE — Telephone Encounter (Signed)
I need PCP input on this given his age and his blood pressure medication

## 2019-06-12 ENCOUNTER — Other Ambulatory Visit: Payer: Self-pay | Admitting: Family Medicine

## 2019-06-16 ENCOUNTER — Telehealth: Payer: Self-pay

## 2019-06-16 NOTE — Telephone Encounter (Signed)
Left voicemail for pt to inform him that it is time for his annual lung cancer screening. Instructed pt to return call with information prior to CT scan being scheduled.

## 2019-07-18 ENCOUNTER — Other Ambulatory Visit: Payer: Self-pay | Admitting: Cardiology

## 2019-08-05 ENCOUNTER — Encounter: Payer: Self-pay | Admitting: Family Medicine

## 2019-08-09 MED ORDER — ZINC GLUCONATE 50 MG PO CAPS: 50.0000 mg | ORAL_CAPSULE | Freq: Every day | ORAL | 3 refills | Status: DC

## 2019-08-28 ENCOUNTER — Other Ambulatory Visit: Payer: Self-pay | Admitting: Family Medicine

## 2019-08-29 NOTE — Telephone Encounter (Signed)
Does patient need to continue RX supplement? Refill request from pharmacy

## 2019-09-05 ENCOUNTER — Other Ambulatory Visit: Payer: Self-pay | Admitting: Cardiology

## 2019-09-19 ENCOUNTER — Encounter: Payer: Self-pay | Admitting: *Deleted

## 2019-11-27 ENCOUNTER — Other Ambulatory Visit: Payer: Self-pay | Admitting: Family Medicine

## 2020-01-25 ENCOUNTER — Telehealth: Payer: Self-pay | Admitting: Family Medicine

## 2020-01-25 NOTE — Progress Notes (Signed)
  Chronic Care Management   Outreach Note  01/25/2020 Name: William Hebert MRN: [id number] DOB: 09/12/1945  Referred by: Ria Bush, MD Reason for referral : No chief complaint on file.   An unsuccessful telephone outreach was attempted today. The patient was referred to the pharmacist for assistance with care management and care coordination.   This note is not being shared with the patient for the following reason: To respect privacy (The patient or proxy has requested that the information not be shared).  Follow Up Plan:   Earney [facility] Scheduler

## 2020-02-23 ENCOUNTER — Other Ambulatory Visit: Payer: Self-pay | Admitting: Family Medicine

## 2020-02-23 NOTE — Telephone Encounter (Signed)
E-scribed refill.  Plz schedule wellness, cpe and lab visits for August.

## 2020-03-05 NOTE — Telephone Encounter (Signed)
Called patient and left voicemail for him to call office and schedule CPE, AWV and labs.

## 2020-03-08 ENCOUNTER — Telehealth: Payer: Self-pay | Admitting: Family Medicine

## 2020-03-08 NOTE — Telephone Encounter (Signed)
William Hebert - Client TELEPHONE ADVICE RECORD [facility] Patient Name: William Hebert Gender: Male DOB: [birth year] Age: 74 Y 78 M 3 D Return Phone Number: [phone] (Primary) [place] [address] Client William Hebert - Client Client Site [facility] - Hebert Physician Ria Bush - MD Contact Type Call Who Is Calling Patient / Member / Family / Caregiver Call Type Triage / Clinical Caller Name William Hebert Relationship To Patient Spouse Return Phone Number [phone] (Primary) Chief Complaint Dizziness Reason for Call Symptomatic / Request for [place] states is calling from [facility] office for a pt to be triage. Her husband has been having a lot of dizzy spells and exceptionally thirsty. Translation No Nurse Assessment Nurse: Kathi Ludwig, RN, Leana Roe Date/Time (Eastern Time): 03/08/2020 1:39:38 PM Confirm and document reason for call. If symptomatic, describe symptoms. ---Caller states occ dizzy spells over 10 days. COPD, prod cough, increased phlegm. increased thirst. Continues to work. Has the patient had close contact with a person known or suspected to have the novel coronavirus illness OR traveled / lives in area with major community spread (including international travel) in the last 14 days from the onset of symptoms? * If Asymptomatic, screen for exposure and travel within the last 14 days. ---No Does the patient have any new or worsening symptoms? ---Yes Will a triage be completed? ---Yes Related visit to physician within the last 2 weeks? ---No Does the PT have any chronic conditions? (i.e. diabetes, asthma, this includes High risk factors for pregnancy, etc.) ---Yes List chronic conditions. ---COPD, HTN Is this a behavioral health or substance abuse call? ---No Guidelines Guideline Title Affirmed Question Affirmed Notes  Nurse Date/Time (Eastern Time) Dizziness - Lightheadedness [1] MODERATE dizziness (e.g., interferes with normal activities) AND [2] has NOT been evaluated by physician for this (Exception: dizziness Kathi Ludwig, RN, Leana Roe 03/08/2020 1:41:45 PM PLEASE NOTE: All timestamps contained within this report are represented as [place] Standard Time. CONFIDENTIALTY NOTICE: This fax transmission is intended only for the addressee. It contains information that is legally privileged, confidential or otherwise protected from use or disclosure. If you are not the intended recipient, you are strictly prohibited from reviewing, disclosing, copying using or disseminating any of this information or taking any action in reliance on or regarding this information. If you have received this fax in error, please notify us immediately by telephone so that we can arrange for its return to [place]. Phone: [phone], Toll-Free: [phone], Fax: [phone] Page: 2 of 2 Call Id: [id number] Guidelines Guideline Title Affirmed Question Affirmed Notes Nurse Date/Time Eilene Ghazi Time) caused by heat exposure, sudden standing, or poor fluid intake) Disp. Time Eilene Ghazi Time) Disposition Final User 03/08/2020 1:46:03 PM See PCP within 24 Hours Yes Kathi Ludwig, RN, Leana Roe Caller Disagree/Comply Comply Caller Understands Yes PreDisposition Did not know what to do Care Advice Given Per Guideline SEE PCP WITHIN 24 HOURS: * IF OFFICE WILL BE OPEN: You need to be examined within the next 24 hours. Call your doctor (or NP/PA) when the office opens and make an appointment. DRINK FLUIDS: * Drink several glasses of fruit juice, other clear fluids or water. CARE ADVICE given per Dizziness (Adult) guideline. LIE DOWN AND REST: * Lie down with feet elevated for 1 hour. * This will improve circulation and increase blood flow to the brain. CALL BACK IF: * Passes out (faints) * You become worse. Referrals REFERRED TO PCP OFFICE

## 2020-03-08 NOTE — Telephone Encounter (Signed)
Will see then. 

## 2020-03-08 NOTE — Telephone Encounter (Signed)
Patient's wife called today stating the patient is having a lot of dizziness, cough getting worse, patient does have copd, and concerned with his sugar,  Phone call was sent to access nurse to be triaged. The wife called back and stated she was advised for patient to be seen within 24 hrs. She stated the patient will not do that. And due to his work schedule he would need to be seen next week. Wife scheduled appointment for Tuesday but called back and r/s'd because that day would not work. Patient is schedule Wednesday @ 3:45. Requested late afternoon.   Wife was advised that if the dizziness gets worse he needed to go straight to the ED or UC . Wife voiced understanding

## 2020-03-12 ENCOUNTER — Ambulatory Visit: Payer: Medicare Other | Admitting: Family Medicine

## 2020-03-13 ENCOUNTER — Other Ambulatory Visit: Payer: Self-pay

## 2020-03-13 ENCOUNTER — Encounter: Payer: Self-pay | Admitting: Family Medicine

## 2020-03-13 ENCOUNTER — Ambulatory Visit (INDEPENDENT_AMBULATORY_CARE_PROVIDER_SITE_OTHER): Payer: Medicare Other | Admitting: Family Medicine

## 2020-03-13 VITALS — BP 132/66 | HR 58 | Temp 97.6°F | Ht 71.0 in | Wt 317.2 lb

## 2020-03-13 DIAGNOSIS — E118 Type 2 diabetes mellitus with unspecified complications: Secondary | ICD-10-CM

## 2020-03-13 DIAGNOSIS — E1165 Type 2 diabetes mellitus with hyperglycemia: Secondary | ICD-10-CM

## 2020-03-13 DIAGNOSIS — J449 Chronic obstructive pulmonary disease, unspecified: Secondary | ICD-10-CM | POA: Diagnosis not present

## 2020-03-13 DIAGNOSIS — R634 Abnormal weight loss: Secondary | ICD-10-CM | POA: Diagnosis not present

## 2020-03-13 DIAGNOSIS — Z6841 Body Mass Index (BMI) 40.0 and over, adult: Secondary | ICD-10-CM

## 2020-03-13 DIAGNOSIS — IMO0002 Reserved for concepts with insufficient information to code with codable children: Secondary | ICD-10-CM

## 2020-03-13 LAB — POCT GLYCOSYLATED HEMOGLOBIN (HGB A1C): Hemoglobin A1C: 12.5 % — AB (ref 4.0–5.6)

## 2020-03-13 MED ORDER — METFORMIN HCL 500 MG PO TABS: ORAL_TABLET | ORAL | 3 refills | Status: DC

## 2020-03-13 NOTE — Assessment & Plan Note (Signed)
Unintentional - hyperglycemia related.

## 2020-03-13 NOTE — Assessment & Plan Note (Addendum)
Marked deterioration over the past year, unclear duration of uncontrolled sugars. This would explain his increased thirst and urination as well as likely dizziness. Reviewed pathophysiology of type 2 diabetes, microvascular and macrovascular risks of uncontrolled diabetes, as well as management strategies. Given symptomatic hyperglycemia with weight loss, recommended starting injectable insulin - he adamantly refuses injections. Recommend glucometer to monitor sugars at home as well as referral to diabetes education classes - he declines. If changes his mind will let us know.  Agrees to start metformin 500mg  daily x 3 days then increase to BID. Reviewed side effects to watch for. Discussed this is a slow acting medication that will take time to take effect. Recommended low sugar low carb diabetic diet, discussed importance of drastic diet changes to achieve glycemic control.  RTC 6 wks close f/u visit.  Diabetes and nutrition handout provided today.

## 2020-03-13 NOTE — Progress Notes (Signed)
This visit was conducted in person.  BP 132/66 (BP Location: Left Arm, Patient Position: Sitting, Cuff Size: Large)   Pulse (!) 58   Temp 97.6 F (36.4 C) (Temporal)   Ht 5\' 11"  (1.803 m)   Wt (!) 317 lb 4 oz (143.9 kg)   SpO2 98%   BMI 44.25 kg/m   Orthostatic VS for the past 24 hrs (Last 3 readings):  BP- Lying BP- Standing at 0 minutes  03/13/20 1558 -- 150/78  03/13/20 1556 158/82 --   CC: DM f/u, dizziness Subjective:    Patient ID: William Hebert, male    DOB: 1945/12/05, 74 y.o.   MRN: [id number]  HPI: William Hebert is a 74 y.o. male presenting on 03/13/2020 for Diabetes (Here for f/u.) and Dizziness (C/o dizziness when lying on left side.  States he has crystals in his ear.  Started about 1 mo ago.  Has seen ENT for sxs. )   Last seen 04/2019.  20 lb weight loss.  No noted abdominal pain, nausea, early satiety, dysphagia.  Continues working 3d every 2 weeks at [employer].   Notes intermittent lightheadedness when he first sits or stands.  Notes vertigo when he lays on his left side. Mild.  Notes increased thirst, increased urination.  Notes increasing leg cramps treated with pickle juice, but no claudication symptoms.   Increasing cough noted over last few months, associated with increased mucous production. No significant dyspnea except with humidity.   DM - does not regularly check sugars. Compliant with antihyperglycemic regimen which includes: was previously diet controlled. Denies hypoglycemic symptoms. Denies paresthesias, blurry vision. Last diabetic eye exam: NONE. Pneumovax: 09/2015. Prevnar: 02/2014. Glucometer brand: doesn't have. DSME: has not completed - declines. Drinking good water, drinks 1/2 gallon over.  Lab Results  Component Value Date   HGBA1C 12.5 (A) 03/13/2020   Diabetic Foot Exam - Simple   Simple Foot Form Diabetic Foot exam was performed with the following findings: Yes 03/13/2020  5:58 PM  Visual Inspection No deformities, no  ulcerations, no other skin breakdown bilaterally: Yes Sensation Testing Intact to touch and monofilament testing bilaterally: Yes Pulse Check See comments: Yes Comments Diminished pulses    No results found for: Derl Barrow       Relevant past medical, surgical, family and social history reviewed and updated as indicated. Interim medical history since our last visit reviewed. Allergies and medications reviewed and updated. Outpatient Medications Prior to Visit  Medication Sig Dispense Refill  . albuterol (PROVENTIL HFA;VENTOLIN HFA) 108 (90 Base) MCG/ACT inhaler Inhale 1-2 puffs into the lungs every 4 (four) hours as needed for wheezing or shortness of breath.    . Ascorbic Acid (VITAMIN C PO) Take 1,000 mg by mouth daily.    [name] aspirin 81 MG chewable tablet Chew daily by mouth.    . Cholecalciferol (VITAMIN D3) 1.25 MG (50000 UT) CAPS TAKE 50,000 UNITS (1 CAPSULE) BY MOUTH EVERY FRIDAY. IN THE MORNING. 12 capsule 1  . furosemide (LASIX) 40 MG tablet TAKE 1 TABLET BY MOUTH  TWICE DAILY 180 tablet 3  . ibuprofen (ADVIL) 600 MG tablet Take 1 tablet (600 mg total) by mouth every 8 (eight) hours as needed for moderate pain. 30 tablet 0  . labetalol (NORMODYNE) 200 MG tablet TAKE 1 TABLET BY MOUTH  DAILY 90 tablet 3  . methocarbamol (ROBAXIN) 500 MG tablet Take 1-2 tablets (500-1,000 mg total) by mouth 2 (two) times daily as needed for muscle spasms (sedation  precautions). 40 tablet 0  . Multiple Vitamins-Minerals (MULTIVITAMIN ADULT PO) Take 1 tablet by mouth daily. MEN'S 50+ MULTIVITAMIN.    [name] NIFEdipine (PROCARDIA XL/NIFEDICAL XL) 60 MG 24 hr tablet TAKE 1 TABLET BY MOUTH  DAILY 90 tablet 3  . omeprazole (PRILOSEC) 40 MG capsule TAKE 1 CAPSULE BY MOUTH  DAILY 90 capsule 0  . potassium chloride SA (K-DUR) 20 MEQ tablet TAKE 1 TABLET BY MOUTH  DAILY 90 tablet 3  . rosuvastatin (CRESTOR) 20 MG tablet TAKE 1 TABLET BY MOUTH  DAILY 90 tablet 3  . Turmeric 500 MG TABS Take 1 tablet by  mouth daily.    . valsartan (DIOVAN) 80 MG tablet TAKE 1 TABLET BY MOUTH  DAILY 90 tablet 3  . Zinc 100 MG TABS Take 1 tablet (100 mg total) by mouth daily. 90 tablet 3  . Zinc Gluconate 50 MG CAPS Take 1 capsule (50 mg total) by mouth daily. 90 capsule 3   No facility-administered medications prior to visit.     Per HPI unless specifically indicated in ROS section below Review of Systems Objective:  BP 132/66 (BP Location: Left Arm, Patient Position: Sitting, Cuff Size: Large)   Pulse (!) 58   Temp 97.6 F (36.4 C) (Temporal)   Ht 5\' 11"  (1.803 m)   Wt (!) 317 lb 4 oz (143.9 kg)   SpO2 98%   BMI 44.25 kg/m   Wt Readings from Last 3 Encounters:  03/13/20 (!) 317 lb 4 oz (143.9 kg)  04/10/19 (!) 338 lb 5 oz (153.5 kg)  04/03/19 (!) 330 lb (149.7 kg)      Physical Exam Vitals and nursing note reviewed.  Constitutional:      General: He is not in acute distress.    Appearance: Normal appearance. He is well-developed. He is obese. He is not ill-appearing.  HENT:     Head: Normocephalic and atraumatic.     Right Ear: External ear normal.     Left Ear: External ear normal.  Eyes:     General: No scleral icterus.    Extraocular Movements: Extraocular movements intact.     Conjunctiva/sclera: Conjunctivae normal.     Pupils: Pupils are equal, round, and reactive to light.  Cardiovascular:     Rate and Rhythm: Normal rate and regular rhythm.     Pulses: Normal pulses.     Heart sounds: Normal heart sounds. No murmur heard.   Pulmonary:     Effort: Pulmonary effort is normal. No respiratory distress.     Breath sounds: Normal breath sounds. No wheezing, rhonchi or rales.     Comments: Lungs clear Abdominal:     General: Abdomen is protuberant. Bowel sounds are normal. There is no distension.     Palpations: Abdomen is soft. There is no mass.     Tenderness: There is no abdominal tenderness. There is no right CVA tenderness, left CVA tenderness, guarding or rebound.      Hernia: No hernia is present.  Musculoskeletal:     Cervical back: Normal range of motion and neck supple.     Right lower leg: Edema (tr) present.     Left lower leg: Edema (tr) present.     Comments: See HPI for foot exam if done  Lymphadenopathy:     Cervical: No cervical adenopathy.  Skin:    General: Skin is warm and dry.     Findings: No rash.  Neurological:     Mental Status: He is alert.  Psychiatric:        Mood and Affect: Mood normal.        Behavior: Behavior normal.       Results for orders placed or performed in visit on 03/13/20  POCT glycosylated hemoglobin (Hb A1C)  Result Value Ref Range   Hemoglobin A1C 12.5 (A) 4.0 - 5.6 %   HbA1c POC (<> result, manual entry)     HbA1c, POC (prediabetic range)     HbA1c, POC (controlled diabetic range)     Assessment & Plan:  This visit occurred during the SARS-CoV-2 public health emergency.  Safety protocols were in place, including screening questions prior to the visit, additional usage of staff PPE, and extensive cleaning of exam room while observing appropriate contact time as indicated for disinfecting solutions.   Problem List Items Addressed This Visit    Weight loss    Unintentional - hyperglycemia related.       Uncontrolled diabetes mellitus with complication, without long-term current use of insulin (HCC) - Primary    Marked deterioration over the past year, unclear duration of uncontrolled sugars. This would explain his increased thirst and urination as well as likely dizziness. Reviewed pathophysiology of type 2 diabetes, microvascular and macrovascular risks of uncontrolled diabetes, as well as management strategies. Given symptomatic hyperglycemia with weight loss, recommended starting injectable insulin - he adamantly refuses injections. Recommend glucometer to monitor sugars at home as well as referral to diabetes education classes - he declines. If changes his mind will let us know.  Agrees to start  metformin 500mg  daily x 3 days then increase to BID. Reviewed side effects to watch for. Discussed this is a slow acting medication that will take time to take effect. Recommended low sugar low carb diabetic diet, discussed importance of drastic diet changes to achieve glycemic control.  RTC 6 wks close f/u visit.  Diabetes and nutrition handout provided today.       Relevant Medications   metFORMIN (GLUCOPHAGE) 500 MG tablet   Other Relevant Orders   POCT glycosylated hemoglobin (Hb A1C) (Completed)   Comprehensive metabolic panel   Morbid obesity with BMI of 40.0-44.9, adult (HCC)    Weight loss noted - anticipate related to uncontrolled hyperglycemia. Discussed.       Relevant Medications   metFORMIN (GLUCOPHAGE) 500 MG tablet   COPD (chronic obstructive pulmonary disease) (HCC)    No signs of COPD exacerbation at this time.  Reviewed avoiding heat/humidity as able.          Meds ordered this encounter  Medications  . metFORMIN (GLUCOPHAGE) 500 MG tablet    Sig: Take 1 tablet (500 mg total) by mouth daily with breakfast for 3 days, THEN 1 tablet (500 mg total) 2 (two) times daily with a meal.    Dispense:  60 tablet    Refill:  3   Orders Placed This Encounter  Procedures  . Comprehensive metabolic panel  . POCT glycosylated hemoglobin (Hb A1C)   Patient instructions: A1c was very high today - uncontrolled diabetes range.  Change diet - increase water intake, back off added sugars, sweetened beverages, simple carbs like pasta, rice, potatoes, bread. Watch portion sizes for carbs.  Start metformin 500mg  once daily and if tolerated after 4 days increase to twice daily.  You are eligible for diabetes classes (with nutritionist and diabetes nurse) - let me know if interested.  Schedule 6 wk follow up visit for diabetes.  Let us know if any worsening symptoms in  the meantime.  If interested in glucometer, check with insurance on preferred sugar brand. Consider continuous  glucose meter if willing to pay out of pocket.   Follow up plan: Return in about 6 weeks (around 04/24/2020) for follow up visit.  Ria Bush, MD

## 2020-03-13 NOTE — Assessment & Plan Note (Signed)
Weight loss noted - anticipate related to uncontrolled hyperglycemia. Discussed.

## 2020-03-13 NOTE — Assessment & Plan Note (Addendum)
No signs of COPD exacerbation at this time.  Reviewed avoiding heat/humidity as able.

## 2020-03-13 NOTE — Patient Instructions (Addendum)
A1c was very high today - uncontrolled diabetes range.  Change diet - increase water intake, back off added sugars, sweetened beverages, simple carbs like pasta, rice, potatoes, bread. Watch portion sizes for carbs.  Start metformin 500mg  once daily and if tolerated after 4 days increase to twice daily.  You are eligible for diabetes classes (with nutritionist and diabetes nurse) - let me know if interested.  Schedule 6 wk follow up visit for diabetes.  Let us know if any worsening symptoms in the meantime.  If interested in glucometer, check with insurance on preferred sugar brand. Consider continuous glucose meter if willing to pay out of pocket.   Diabetes Mellitus and Nutrition, Adult When you have diabetes (diabetes mellitus), it is very important to have healthy eating habits because your blood sugar (glucose) levels are greatly affected by what you eat and drink. Eating healthy foods in the appropriate amounts, at about the same times every day, can help you:  Control your blood glucose.  Lower your risk of heart disease.  Improve your blood pressure.  Reach or maintain a healthy weight. Every person with diabetes is different, and each person has different needs for a meal plan. Your health care provider may recommend that you work with a diet and nutrition specialist (dietitian) to make a meal plan that is best for you. Your meal plan may vary depending on factors such as:  The calories you need.  The medicines you take.  Your weight.  Your blood glucose, blood pressure, and cholesterol levels.  Your activity level.  Other health conditions you have, such as heart or kidney disease. How do carbohydrates affect me? Carbohydrates, also called carbs, affect your blood glucose level more than any other type of food. Eating carbs naturally raises the amount of glucose in your blood. Carb counting is a method for keeping track of how many carbs you eat. Counting carbs is important  to keep your blood glucose at a healthy level, especially if you use insulin or take certain oral diabetes medicines. It is important to know how many carbs you can safely have in each meal. This is different for every person. Your dietitian can help you calculate how many carbs you should have at each meal and for each snack. Foods that contain carbs include:  Bread, cereal, rice, pasta, and crackers.  Potatoes and corn.  Peas, beans, and lentils.  Milk and yogurt.  Fruit and juice.  Desserts, such as cakes, cookies, ice cream, and candy. How does alcohol affect me? Alcohol can cause a sudden decrease in blood glucose (hypoglycemia), especially if you use insulin or take certain oral diabetes medicines. Hypoglycemia can be a life-threatening condition. Symptoms of hypoglycemia (sleepiness, dizziness, and confusion) are similar to symptoms of having too much alcohol. If your health care provider says that alcohol is safe for you, follow these guidelines:  Limit alcohol intake to no more than 1 drink per day for nonpregnant women and 2 drinks per day for men. One drink equals 12 oz of beer, 5 oz of wine, or 1 oz of hard liquor.  Do not drink on an empty stomach.  Keep yourself hydrated with water, diet soda, or unsweetened iced tea.  Keep in mind that regular soda, juice, and other mixers may contain a lot of sugar and must be counted as carbs. What are tips for following this plan?  Reading food labels  Start by checking the serving size on the "Nutrition Facts" label of packaged foods  and drinks. The amount of calories, carbs, fats, and other nutrients listed on the label is based on one serving of the item. Many items contain more than one serving per package.  Check the total grams (g) of carbs in one serving. You can calculate the number of servings of carbs in one serving by dividing the total carbs by 15. For example, if a food has 30 g of total carbs, it would be equal to 2  servings of carbs.  Check the number of grams (g) of saturated and trans fats in one serving. Choose foods that have low or no amount of these fats.  Check the number of milligrams (mg) of salt (sodium) in one serving. Most people should limit total sodium intake to less than 2,300 mg per day.  Always check the nutrition information of foods labeled as "low-fat" or "nonfat". These foods may be higher in added sugar or refined carbs and should be avoided.  Talk to your dietitian to identify your daily goals for nutrients listed on the label. Shopping  Avoid buying canned, premade, or processed foods. These foods tend to be high in fat, sodium, and added sugar.  Shop around the outside edge of the grocery store. This includes fresh fruits and vegetables, bulk grains, fresh meats, and fresh dairy. Cooking  Use low-heat cooking methods, such as baking, instead of high-heat cooking methods like deep frying.  Cook using healthy oils, such as olive, canola, or sunflower oil.  Avoid cooking with butter, cream, or high-fat meats. Meal planning  Eat meals and snacks regularly, preferably at the same times every day. Avoid going long periods of time without eating.  Eat foods high in fiber, such as fresh fruits, vegetables, beans, and whole grains. Talk to your dietitian about how many servings of carbs you can eat at each meal.  Eat 4-6 ounces (oz) of lean protein each day, such as lean meat, chicken, fish, eggs, or tofu. One oz of lean protein is equal to: ? 1 oz of meat, chicken, or fish. ? 1 egg. ?  cup of tofu.  Eat some foods each day that contain healthy fats, such as avocado, nuts, seeds, and fish. Lifestyle  Check your blood glucose regularly.  Exercise regularly as told by your health care provider. This may include: ? 150 minutes of moderate-intensity or vigorous-intensity exercise each week. This could be brisk walking, biking, or water aerobics. ? Stretching and doing  strength exercises, such as yoga or weightlifting, at least 2 times a week.  Take medicines as told by your health care provider.  Do not use any products that contain nicotine or tobacco, such as cigarettes and e-cigarettes. If you need help quitting, ask your health care provider.  Work with a Social worker or diabetes educator to identify strategies to manage stress and any emotional and social challenges. Questions to ask a health care provider  Do I need to meet with a diabetes educator?  Do I need to meet with a dietitian?  What number can I call if I have questions?  When are the best times to check my blood glucose? Where to find more information:  American Diabetes Association: diabetes.org  Academy of Nutrition and Dietetics: www.eatright.[employer] of Diabetes and Digestive and Kidney Diseases (NIH): [employer] Summary  A healthy meal plan will help you control your blood glucose and maintain a healthy lifestyle.  Working with a diet and nutrition specialist (dietitian) can help you make a meal plan that  is best for you.  Keep in mind that carbohydrates (carbs) and alcohol have immediate effects on your blood glucose levels. It is important to count carbs and to use alcohol carefully. This information is not intended to replace advice given to you by your health care provider. Make sure you discuss any questions you have with your health care provider. Document Revised: 07/30/2017 Document Reviewed: 09/21/2016 Elsevier Patient Education  2020 [employer].

## 2020-03-14 LAB — COMPREHENSIVE METABOLIC PANEL
ALT: 21 U/L (ref 0–53)
AST: 20 U/L (ref 0–37)
Albumin: 3.8 g/dL (ref 3.5–5.2)
Alkaline Phosphatase: 97 U/L (ref 39–117)
BUN: 18 mg/dL (ref 6–23)
CO2: 26 mEq/L (ref 19–32)
Calcium: 9.1 mg/dL (ref 8.4–10.5)
Chloride: 99 mEq/L (ref 96–112)
Creatinine, Ser: 1.25 mg/dL (ref 0.40–1.50)
GFR: 56.71 mL/min — ABNORMAL LOW (ref >60.00)
Glucose, Bld: 458 mg/dL — ABNORMAL HIGH (ref 70–99)
Potassium: 3.9 mEq/L (ref 3.5–5.1)
Sodium: 135 mEq/L (ref 135–145)
Total Bilirubin: 0.3 mg/dL (ref 0.2–1.2)
Total Protein: 6.2 g/dL (ref 6.0–8.3)

## 2020-03-14 NOTE — Telephone Encounter (Signed)
Called patient and left voicemail for him to call office to schedule CPE, AWV and labs.

## 2020-03-17 ENCOUNTER — Encounter: Payer: Self-pay | Admitting: Family Medicine

## 2020-03-19 MED ORDER — FREESTYLE LIBRE 14 DAY READER DEVI: 3 refills | Status: DC

## 2020-03-19 MED ORDER — FREESTYLE LIBRE 14 DAY SENSOR MISC: 3 refills | Status: DC

## 2020-03-19 MED ORDER — FREESTYLE LIBRE SENSOR SYSTEM MISC: 0 refills | Status: DC

## 2020-03-20 MED ORDER — GLIPIZIDE 5 MG PO TABS
5.0000 mg | ORAL_TABLET | Freq: Two times a day (BID) | ORAL | 3 refills | Status: DC
Start: 2020-03-20 — End: 2020-04-24

## 2020-03-20 NOTE — Addendum Note (Signed)
Addended by: Ria Bush on: 03/20/2020 02:56 PM   Modules accepted: Orders

## 2020-03-22 ENCOUNTER — Telehealth: Payer: Self-pay

## 2020-03-22 DIAGNOSIS — IMO0002 Reserved for concepts with insufficient information to code with codable children: Secondary | ICD-10-CM

## 2020-03-22 DIAGNOSIS — E1165 Type 2 diabetes mellitus with hyperglycemia: Secondary | ICD-10-CM

## 2020-03-22 MED ORDER — ACCU-CHEK FASTCLIX LANCETS MISC: 0 refills | Status: DC

## 2020-03-22 MED ORDER — ACCU-CHEK GUIDE VI STRP
ORAL_STRIP | 1 refills | Status: DC
Start: 2020-03-22 — End: 2022-01-15

## 2020-03-22 MED ORDER — ACCU-CHEK GUIDE W/DEVICE KIT: PACK | 0 refills | Status: AC

## 2020-03-22 NOTE — Telephone Encounter (Signed)
E-scribed Accu-Chek Guide meter kit, test strips and Fastclix lancets.  

## 2020-03-22 NOTE — Telephone Encounter (Signed)
Pt's wife called back to say insurance will Accu-Chek or OneTouch brands. Please send meter, strips, and lancets to [facility].

## 2020-03-25 ENCOUNTER — Other Ambulatory Visit: Payer: Self-pay | Admitting: *Deleted

## 2020-03-25 DIAGNOSIS — IMO0002 Reserved for concepts with insufficient information to code with codable children: Secondary | ICD-10-CM

## 2020-03-25 DIAGNOSIS — E1165 Type 2 diabetes mellitus with hyperglycemia: Secondary | ICD-10-CM

## 2020-03-25 NOTE — Telephone Encounter (Signed)
William Hebert with [facility] called in a fax will come in stating a script is needed to be sent to local pharmacy and mail order. Please advise.

## 2020-03-25 NOTE — Telephone Encounter (Signed)
Chelsea pharmacist with [facility] left a voicemail stating that patient needs scripts called in or sent in for Accucheck guide test strips and soft click lancets. Chelsea stated that patient was able to test today and has items to check his blood sugar tomorrow and them will be out. Chelsea stated a script can be sent electronically or called in. Reference number #[id number] Scripts were already sent to [facility] on 03/22/20

## 2020-03-27 NOTE — Telephone Encounter (Signed)
See new message from today with the needed information.

## 2020-03-28 MED ORDER — ACCU-CHEK GUIDE VI STRP
ORAL_STRIP | 12 refills | Status: DC
Start: 2020-03-28 — End: 2022-01-15

## 2020-03-28 MED ORDER — ACCU-CHEK SOFTCLIX LANCETS MISC: 12 refills | Status: DC

## 2020-03-28 NOTE — Telephone Encounter (Signed)
Scripts sent in

## 2020-04-14 ENCOUNTER — Other Ambulatory Visit: Payer: Self-pay | Admitting: Family Medicine

## 2020-04-15 NOTE — Telephone Encounter (Signed)
E-scribed refill.  Plz schedule wellness, cpe and lab visits.  

## 2020-04-24 ENCOUNTER — Other Ambulatory Visit: Payer: Self-pay

## 2020-04-24 ENCOUNTER — Ambulatory Visit (INDEPENDENT_AMBULATORY_CARE_PROVIDER_SITE_OTHER): Payer: Medicare Other | Admitting: Family Medicine

## 2020-04-24 ENCOUNTER — Encounter: Payer: Self-pay | Admitting: Family Medicine

## 2020-04-24 VITALS — BP 130/68 | HR 72 | Temp 98.2°F | Ht 71.0 in | Wt 316.1 lb

## 2020-04-24 DIAGNOSIS — E1165 Type 2 diabetes mellitus with hyperglycemia: Secondary | ICD-10-CM

## 2020-04-24 DIAGNOSIS — E118 Type 2 diabetes mellitus with unspecified complications: Secondary | ICD-10-CM | POA: Diagnosis not present

## 2020-04-24 DIAGNOSIS — IMO0002 Reserved for concepts with insufficient information to code with codable children: Secondary | ICD-10-CM

## 2020-04-24 NOTE — Patient Instructions (Addendum)
Congratulations on healthy diet changes  Labs today to check fructosamine (averages sugar control over 6 weeks) and kidney check.  Return in 2 months for medicare wellness visit

## 2020-04-24 NOTE — Assessment & Plan Note (Addendum)
Endorses improved readings on limited testing done.  Congratulated on healthy diet choices to date.  Update BMP and fructosamine today, will be in touch with results.  RTC 2 mo CPE/AMW and recheck A1c at that time.

## 2020-04-24 NOTE — Progress Notes (Signed)
This visit was conducted in person.  BP 130/68 (BP Location: Left Arm, Patient Position: Sitting, Cuff Size: Large)   Pulse 72   Temp 98.2 F (36.8 C) (Temporal)   Ht 5\' 11"  (1.803 m)   Wt (!) 316 lb 2 oz (143.4 kg)   SpO2 95%   BMI 44.09 kg/m    CC: 6 wk DM f/u visit  Subjective:    Patient ID: William Hebert, male    DOB: [birth year], 74 y.o.   MRN: [id number]  HPI: William Hebert is a 74 y.o. male presenting on 04/24/2020 for Diabetes (Here for 6 wk f/u of new onset DM. )   DM - does not regularly check sugars 100 3 days ago. Compliant with antihyperglycemic regimen which includes: metformin 500mg  BID. Gave up sweet tea - was drinking 1/2 gallon/day as well as white bread. Drinking Me-Yo. Denies low sugars or hypoglycemic symptoms. Denies paresthesias. Last diabetic eye exam DUE. Pneumovax: 2017. Prevnar: 2015. Glucometer brand: accuchek. DSME: declined.  Lab Results  Component Value Date   HGBA1C 12.5 (A) 03/13/2020   Diabetic Foot Exam - Simple   No data filed     No results found for: 2018       Relevant past medical, surgical, family and social history reviewed and updated as indicated. Interim medical history since our last visit reviewed. Allergies and medications reviewed and updated. Outpatient Medications Prior to Visit  Medication Sig Dispense Refill  . Accu-Chek FastClix Lancets MISC Use as instructed to check blood sugar once daily. 100 each 0  . Accu-Chek Softclix Lancets lancets Use as instructed 100 each 12  . albuterol (PROVENTIL HFA;VENTOLIN HFA) 108 (90 Base) MCG/ACT inhaler Inhale 1-2 puffs into the lungs every 4 (four) hours as needed for wheezing or shortness of breath.    . Ascorbic Acid (VITAMIN C PO) Take 1,000 mg by mouth daily.    03/15/2020 aspirin 81 MG chewable tablet Chew daily by mouth.    . Blood Glucose Monitoring Suppl (ACCU-CHEK GUIDE) w/Device KIT Use as instructed to check blood sugar once daily. 1 kit 0  . Cholecalciferol  (VITAMIN D3) 1.25 MG (50000 UT) CAPS TAKE 50,000 UNITS (1 CAPSULE) BY MOUTH EVERY FRIDAY. IN THE MORNING. 12 capsule 1  . furosemide (LASIX) 40 MG tablet TAKE 1 TABLET BY MOUTH  TWICE DAILY 180 tablet 3  . glucose blood (ACCU-CHEK GUIDE) test strip Use as instructed to check blood sugar once daily. 100 each 1  . glucose blood (ACCU-CHEK GUIDE) test strip Use as instructed 100 each 12  . ibuprofen (ADVIL) 600 MG tablet Take 1 tablet (600 mg total) by mouth every 8 (eight) hours as needed for moderate pain. 30 tablet 0  . labetalol (NORMODYNE) 200 MG tablet TAKE 1 TABLET BY MOUTH  DAILY 90 tablet 3  . metFORMIN (GLUCOPHAGE) 500 MG tablet Take 1 tablet (500 mg total) by mouth daily with breakfast for 3 days, THEN 1 tablet (500 mg total) 2 (two) times daily with a meal. 60 tablet 3  . methocarbamol (ROBAXIN) 500 MG tablet Take 1-2 tablets (500-1,000 mg total) by mouth 2 (two) times daily as needed for muscle spasms (sedation precautions). 40 tablet 0  . Multiple Vitamins-Minerals (MULTIVITAMIN ADULT PO) Take 1 tablet by mouth daily. MEN'S 50+ MULTIVITAMIN.    [place] NIFEdipine (PROCARDIA XL/NIFEDICAL XL) 60 MG 24 hr tablet TAKE 1 TABLET BY MOUTH  DAILY 90 tablet 3  . omeprazole (PRILOSEC) 40 MG capsule TAKE 1 CAPSULE  BY MOUTH  DAILY 90 capsule 0  . potassium chloride SA (K-DUR) 20 MEQ tablet TAKE 1 TABLET BY MOUTH  DAILY 90 tablet 3  . rosuvastatin (CRESTOR) 20 MG tablet TAKE 1 TABLET BY MOUTH  DAILY 90 tablet 3  . Turmeric 500 MG TABS Take 1 tablet by mouth daily.    . valsartan (DIOVAN) 80 MG tablet TAKE 1 TABLET BY MOUTH  DAILY 90 tablet 3  . Zinc 100 MG TABS Take 1 tablet (100 mg total) by mouth daily. 90 tablet 3  . Zinc Gluconate 50 MG CAPS Take 1 capsule (50 mg total) by mouth daily. 90 capsule 3  . Continuous Blood Gluc Receiver (FREESTYLE LIBRE 14 DAY READER) DEVI Use as directed to check sugars daily 2 each 3  . Continuous Blood Gluc Sensor (FREESTYLE LIBRE 14 DAY SENSOR) MISC Use as directed to  check sugars daily 2 each 3  . Continuous Blood Gluc Sensor (FREESTYLE LIBRE SENSOR SYSTEM) MISC Use as directed to check sugars daily 1 each 0  . glipiZIDE (GLUCOTROL) 5 MG tablet Take 1 tablet (5 mg total) by mouth 2 (two) times daily before a meal. (First week take $RemoveB'5mg'WzmcgDTT$  with breakfast only) (Patient not taking: Reported on 04/24/2020) 60 tablet 3   No facility-administered medications prior to visit.     Per HPI unless specifically indicated in ROS section below Review of Systems Objective:  BP 130/68 (BP Location: Left Arm, Patient Position: Sitting, Cuff Size: Large)   Pulse 72   Temp 98.2 F (36.8 C) (Temporal)   Ht $R'5\' 11"'fL$  (1.803 m)   Wt (!) 316 lb 2 oz (143.4 kg)   SpO2 95%   BMI 44.09 kg/m   Wt Readings from Last 3 Encounters:  04/24/20 (!) 316 lb 2 oz (143.4 kg)  03/13/20 (!) 317 lb 4 oz (143.9 kg)  04/10/19 (!) 338 lb 5 oz (153.5 kg)      Physical Exam Vitals and nursing note reviewed.  Constitutional:      Appearance: Normal appearance. He is obese. He is not ill-appearing.  Cardiovascular:     Rate and Rhythm: Normal rate and regular rhythm.     Pulses: Normal pulses.     Heart sounds: Normal heart sounds. No murmur heard.   Pulmonary:     Effort: Pulmonary effort is normal. No respiratory distress.     Breath sounds: Normal breath sounds. No wheezing, rhonchi or rales.  Musculoskeletal:     Right lower leg: No edema.     Left lower leg: No edema.  Neurological:     Mental Status: He is alert.  Psychiatric:        Mood and Affect: Mood normal.        Behavior: Behavior normal.       Results for orders placed or performed in visit on 03/13/20  Comprehensive metabolic panel  Result Value Ref Range   Sodium 135 135 - 145 mEq/L   Potassium 3.9 3.5 - 5.1 mEq/L   Chloride 99 96 - 112 mEq/L   CO2 26 19 - 32 mEq/L   Glucose, Bld 458 (H) 70 - 99 mg/dL   BUN 18 6 - 23 mg/dL   Creatinine, Ser 1.25 0.40 - 1.50 mg/dL   Total Bilirubin 0.3 0.2 - 1.2 mg/dL    Alkaline Phosphatase 97 39 - 117 U/L   AST 20 0 - 37 U/L   ALT 21 0 - 53 U/L   Total Protein 6.2 6.0 - 8.3 g/dL  Albumin 3.8 3.5 - 5.2 g/dL   GFR 56.71 (L) >60.00 mL/min   Calcium 9.1 8.4 - 10.5 mg/dL  POCT glycosylated hemoglobin (Hb A1C)  Result Value Ref Range   Hemoglobin A1C 12.5 (A) 4.0 - 5.6 %   HbA1c POC (<> result, manual entry)     HbA1c, POC (prediabetic range)     HbA1c, POC (controlled diabetic range)     Assessment & Plan:  This visit occurred during the SARS-CoV-2 public health emergency.  Safety protocols were in place, including screening questions prior to the visit, additional usage of staff PPE, and extensive cleaning of exam room while observing appropriate contact time as indicated for disinfecting solutions.   Problem List Items Addressed This Visit    Uncontrolled diabetes mellitus with complication, without long-term current use of insulin ([place]) - Primary    Endorses improved readings on limited testing done.  Congratulated on healthy diet choices to date.  Update BMP and fructosamine today, will be in touch with results.  RTC 2 mo CPE/AMW and recheck A1c at that time.       Relevant Orders   Basic metabolic panel   Fructosamine       No orders of the defined types were placed in this encounter.  Orders Placed This Encounter  Procedures  . Basic metabolic panel  . Fructosamine    Patient Instructions  Congratulations on healthy diet changes  Labs today to check fructosamine (averages sugar control over 6 weeks) and kidney check.  Return in 2 months for medicare wellness visit    Follow up plan: Return in about 2 months (around 06/24/2020) for annual exam, prior fasting for blood work, medicare wellness visit.  Ria Bush, MD

## 2020-04-25 LAB — BASIC METABOLIC PANEL
BUN: 18 mg/dL (ref 6–23)
CO2: 26 mEq/L (ref 19–32)
Calcium: 9.2 mg/dL (ref 8.4–10.5)
Chloride: 102 mEq/L (ref 96–112)
Creatinine, Ser: 1.34 mg/dL (ref 0.40–1.50)
GFR: 52.32 mL/min — ABNORMAL LOW (ref >60.00)
Glucose, Bld: 144 mg/dL — ABNORMAL HIGH (ref 70–99)
Potassium: 3.7 mEq/L (ref 3.5–5.1)
Sodium: 138 mEq/L (ref 135–145)

## 2020-04-26 ENCOUNTER — Encounter: Payer: Self-pay | Admitting: Family Medicine

## 2020-04-26 LAB — FRUCTOSAMINE: Fructosamine: 211 umol/L (ref 205–285)

## 2020-05-01 ENCOUNTER — Other Ambulatory Visit: Payer: Self-pay | Admitting: Family Medicine

## 2020-05-01 ENCOUNTER — Telehealth: Payer: Self-pay | Admitting: Family Medicine

## 2020-05-01 MED ORDER — B COMPLEX VITAMINS PO CAPS
1.0000 | ORAL_CAPSULE | Freq: Every day | ORAL | Status: DC
Start: 2020-05-01 — End: 2020-12-31

## 2020-05-01 NOTE — Telephone Encounter (Signed)
Spouse called wanting to make pt appointment stating pt has cough/sinus issues.  This have been ongoing for months.  Pt had this last month when he was here on 8/25 and did not mention to you, She also stated pt has COPD.   Pt has no other symptoms  Is it ok for pt to come in office

## 2020-05-01 NOTE — Telephone Encounter (Signed)
Can you call and triage? If cough and congestion would likely recommend virtual visit. Could place at 12:30pm on Friday.

## 2020-05-02 NOTE — Telephone Encounter (Addendum)
Lvm asking pt to call back.  Pt would need to see another provider.  Dr. Darnell Level already have a pt scheduled at 12:30 tomorrow.  FYI to Dr. Darnell Level.

## 2020-05-06 ENCOUNTER — Other Ambulatory Visit: Payer: Self-pay | Admitting: Family Medicine

## 2020-05-29 ENCOUNTER — Encounter: Payer: Self-pay | Admitting: Family Medicine

## 2020-05-29 DIAGNOSIS — E1165 Type 2 diabetes mellitus with hyperglycemia: Secondary | ICD-10-CM

## 2020-05-29 DIAGNOSIS — IMO0002 Reserved for concepts with insufficient information to code with codable children: Secondary | ICD-10-CM

## 2020-06-05 NOTE — Telephone Encounter (Signed)
Pt's wife called and said they do need the referral sent to [facility]. Pt is already scheduled for 06/10/20 @ 11:15am but they need the referral.

## 2020-06-05 NOTE — Telephone Encounter (Signed)
Referral placed to [facility].

## 2020-06-05 NOTE — Addendum Note (Signed)
Addended by: Ria Bush on: 06/05/2020 06:12 PM   Modules accepted: Orders

## 2020-06-10 LAB — HM DIABETES EYE EXAM

## 2020-06-12 ENCOUNTER — Encounter: Payer: Self-pay | Admitting: Family Medicine

## 2020-06-12 ENCOUNTER — Other Ambulatory Visit: Payer: Self-pay | Admitting: Family Medicine

## 2020-06-17 NOTE — Telephone Encounter (Signed)
Referral sent 06/05/20  Nothing further needed.

## 2020-06-26 ENCOUNTER — Other Ambulatory Visit: Payer: Self-pay

## 2020-06-26 ENCOUNTER — Ambulatory Visit (INDEPENDENT_AMBULATORY_CARE_PROVIDER_SITE_OTHER): Payer: Medicare Other

## 2020-06-26 DIAGNOSIS — Z0000 Encounter for general adult medical examination without abnormal findings: Secondary | ICD-10-CM

## 2020-06-26 NOTE — Patient Instructions (Signed)
William Hebert , Thank you for taking time to come for your Medicare Wellness Visit. I appreciate your ongoing commitment to your health goals. Please review the following plan we discussed and let me know if I can assist you in the future.   Screening recommendations/referrals: Colonoscopy: Up to date, completed 02/23/2017, due 01/2022 Recommended yearly ophthalmology/optometry visit for glaucoma screening and checkup Recommended yearly dental visit for hygiene and checkup  Vaccinations: Influenza vaccine: declined Pneumococcal vaccine: Completed series Tdap vaccine: Up to date, completed 03/06/2014, due 02/2024 Shingles vaccine: due, check with your insurance regarding coverage    Covid-19: Up to date, completed 06/13/2020, due 07/04/2020  Advanced directives: Advance directive discussed with you today. Even though you declined this today please call our office should you change your mind and we can give you the proper paperwork for you to fill out.  Conditions/risks identified: diabetes, hyperlipidemia  Next appointment: Follow up in one year for your annual wellness visit.   Preventive Care 43 Years and Older, Male Preventive care refers to lifestyle choices and visits with your health care provider that can promote health and wellness. What does preventive care include?  A yearly physical exam. This is also called an annual well check.  Dental exams once or twice a year.  Routine eye exams. Ask your health care provider how often you should have your eyes checked.  Personal lifestyle choices, including:  Daily care of your teeth and gums.  Regular physical activity.  Eating a healthy diet.  Avoiding tobacco and drug use.  Limiting alcohol use.  Practicing safe sex.  Taking low doses of aspirin every day.  Taking vitamin and mineral supplements as recommended by your health care provider. What happens during an annual well check? The services and screenings done by your  health care provider during your annual well check will depend on your age, overall health, lifestyle risk factors, and family history of disease. Counseling  Your health care provider may ask you questions about your:  Alcohol use.  Tobacco use.  Drug use.  Emotional well-being.  Home and relationship well-being.  Sexual activity.  Eating habits.  History of falls.  Memory and ability to understand (cognition).  Work and work Statistician. Screening  You may have the following tests or measurements:  Height, weight, and BMI.  Blood pressure.  Lipid and cholesterol levels. These may be checked every 5 years, or more frequently if you are over 52 years old.  Skin check.  Lung cancer screening. You may have this screening every year starting at age 31 if you have a 30-pack-year history of smoking and currently smoke or have quit within the past 15 years.  Fecal occult blood test (FOBT) of the stool. You may have this test every year starting at age 47.  Flexible sigmoidoscopy or colonoscopy. You may have a sigmoidoscopy every 5 years or a colonoscopy every 10 years starting at age 30.  Prostate cancer screening. Recommendations will vary depending on your family history and other risks.  Hepatitis C blood test.  Hepatitis B blood test.  Sexually transmitted disease (STD) testing.  Diabetes screening. This is done by checking your blood sugar (glucose) after you have not eaten for a while (fasting). You may have this done every 1-3 years.  Abdominal aortic aneurysm (AAA) screening. You may need this if you are a current or former smoker.  Osteoporosis. You may be screened starting at age 75 if you are at high risk. Talk with your health  care provider about your test results, treatment options, and if necessary, the need for more tests. Vaccines  Your health care provider may recommend certain vaccines, such as:  Influenza vaccine. This is recommended every  year.  Tetanus, diphtheria, and acellular pertussis (Tdap, Td) vaccine. You may need a Td booster every 10 years.  Zoster vaccine. You may need this after age 44.  Pneumococcal 13-valent conjugate (PCV13) vaccine. One dose is recommended after age 4.  Pneumococcal polysaccharide (PPSV23) vaccine. One dose is recommended after age 52. Talk to your health care provider about which screenings and vaccines you need and how often you need them. This information is not intended to replace advice given to you by your health care provider. Make sure you discuss any questions you have with your health care provider. Document Released: 09/13/2015 Document Revised: 05/06/2016 Document Reviewed: 06/18/2015 Elsevier Interactive Patient Education  2017 [place] Prevention in the Home Falls can cause injuries. They can happen to people of all ages. There are many things you can do to make your home safe and to help prevent falls. What can I do on the outside of my home?  Regularly fix the edges of walkways and driveways and fix any cracks.  Remove anything that might make you trip as you walk through a door, such as a raised step or threshold.  Trim any bushes or trees on the path to your home.  Use bright outdoor lighting.  Clear any walking paths of anything that might make someone trip, such as rocks or tools.  Regularly check to see if handrails are loose or broken. Make sure that both sides of any steps have handrails.  Any raised decks and porches should have guardrails on the edges.  Have any leaves, snow, or ice cleared regularly.  Use sand or salt on walking paths during winter.  Clean up any spills in your garage right away. This includes oil or grease spills. What can I do in the bathroom?  Use night lights.  Install grab bars by the toilet and in the tub and shower. Do not use towel bars as grab bars.  Use non-skid mats or decals in the tub or shower.  If you  need to sit down in the shower, use a plastic, non-slip stool.  Keep the floor dry. Clean up any water that spills on the floor as soon as it happens.  Remove soap buildup in the tub or shower regularly.  Attach bath mats securely with double-sided non-slip rug tape.  Do not have throw rugs and other things on the floor that can make you trip. What can I do in the bedroom?  Use night lights.  Make sure that you have a light by your bed that is easy to reach.  Do not use any sheets or blankets that are too big for your bed. They should not hang down onto the floor.  Have a firm chair that has side arms. You can use this for support while you get dressed.  Do not have throw rugs and other things on the floor that can make you trip. What can I do in the kitchen?  Clean up any spills right away.  Avoid walking on wet floors.  Keep items that you use a lot in easy-to-reach places.  If you need to reach something above you, use a strong step stool that has a grab bar.  Keep electrical cords out of the way.  Do not use floor  polish or wax that makes floors slippery. If you must use wax, use non-skid floor wax.  Do not have throw rugs and other things on the floor that can make you trip. What can I do with my stairs?  Do not leave any items on the stairs.  Make sure that there are handrails on both sides of the stairs and use them. Fix handrails that are broken or loose. Make sure that handrails are as long as the stairways.  Check any carpeting to make sure that it is firmly attached to the stairs. Fix any carpet that is loose or worn.  Avoid having throw rugs at the top or bottom of the stairs. If you do have throw rugs, attach them to the floor with carpet tape.  Make sure that you have a light switch at the top of the stairs and the bottom of the stairs. If you do not have them, ask someone to add them for you. What else can I do to help prevent falls?  Wear shoes  that:  Do not have high heels.  Have rubber bottoms.  Are comfortable and fit you well.  Are closed at the toe. Do not wear sandals.  If you use a stepladder:  Make sure that it is fully opened. Do not climb a closed stepladder.  Make sure that both sides of the stepladder are locked into place.  Ask someone to hold it for you, if possible.  Clearly mark and make sure that you can see:  Any grab bars or handrails.  First and last steps.  Where the edge of each step is.  Use tools that help you move around (mobility aids) if they are needed. These include:  Canes.  Walkers.  Scooters.  Crutches.  Turn on the lights when you go into a dark area. Replace any light bulbs as soon as they burn out.  Set up your furniture so you have a clear path. Avoid moving your furniture around.  If any of your floors are uneven, fix them.  If there are any pets around you, be aware of where they are.  Review your medicines with your doctor. Some medicines can make you feel dizzy. This can increase your chance of falling. Ask your doctor what other things that you can do to help prevent falls. This information is not intended to replace advice given to you by your health care provider. Make sure you discuss any questions you have with your health care provider. Document Released: 06/13/2009 Document Revised: 01/23/2016 Document Reviewed: 09/21/2014 Elsevier Interactive Patient Education  2017 [employer].

## 2020-06-26 NOTE — Progress Notes (Signed)
Subjective:   William Hebert is a 74 y.o. male who presents for Medicare Annual/Subsequent preventive examination.  Review of Systems: N/A      I connected with the patient today by telephone and verified that I am speaking with the correct person using two identifiers. Location patient: home Location nurse: work Persons participating in the telephone visit: patient, nurse.   I discussed the limitations, risks, security and privacy concerns of performing an evaluation and management service by telephone and the availability of in person appointments. I also discussed with the patient that there may be a patient responsible charge related to this service. The patient expressed understanding and verbally consented to this telephonic visit.        Cardiac Risk Factors include: advanced age (>66men, >55 women);diabetes mellitus;male gender;Other (see comment), Risk factor comments: hyperlipidemia     Objective:    Today's Vitals   06/26/20 1532  PainSc: 9    There is no height or weight on file to calculate BMI.  Advanced Directives 06/26/2020 04/03/2019 03/16/2018 07/08/2017 07/06/2017 02/23/2017 02/19/2017  Does Patient Have a Medical Advance Directive? No No No No No No No  Does patient want to make changes to medical advance directive? - - - No - Patient declined - - -  Would patient like information on creating a medical advance directive? No - Patient declined - No - Patient declined No - Patient declined No - Patient declined No - Patient declined No - Patient declined    Current Medications (verified) Outpatient Encounter Medications as of 06/26/2020  Medication Sig  . Accu-Chek FastClix Lancets MISC Use as instructed to check blood sugar once daily.  . Accu-Chek Softclix Lancets lancets Use as instructed  . albuterol (PROVENTIL HFA;VENTOLIN HFA) 108 (90 Base) MCG/ACT inhaler Inhale 1-2 puffs into the lungs every 4 (four) hours as needed for wheezing or shortness of breath.  .  Ascorbic Acid (VITAMIN C PO) Take 1,000 mg by mouth daily.  [name] aspirin 81 MG chewable tablet Chew daily by mouth.  [name] b complex vitamins capsule Take 1 capsule by mouth daily.  . Blood Glucose Monitoring Suppl (ACCU-CHEK GUIDE) w/Device KIT Use as instructed to check blood sugar once daily.  . Cholecalciferol (VITAMIN D3) 1.25 MG (50000 UT) CAPS TAKE 50,000 UNITS (1 CAPSULE) BY MOUTH EVERY FRIDAY. IN THE MORNING.  . furosemide (LASIX) 40 MG tablet TAKE 1 TABLET BY MOUTH  TWICE DAILY  . glucose blood (ACCU-CHEK GUIDE) test strip Use as instructed to check blood sugar once daily.  [name] glucose blood (ACCU-CHEK GUIDE) test strip Use as instructed  . ibuprofen (ADVIL) 600 MG tablet Take 1 tablet (600 mg total) by mouth every 8 (eight) hours as needed for moderate pain.  [name] labetalol (NORMODYNE) 200 MG tablet TAKE 1 TABLET BY MOUTH  DAILY  . methocarbamol (ROBAXIN) 500 MG tablet Take 1-2 tablets (500-1,000 mg total) by mouth 2 (two) times daily as needed for muscle spasms (sedation precautions).  . Multiple Vitamins-Minerals (MULTIVITAMIN ADULT PO) Take 1 tablet by mouth daily. MEN'S 50+ MULTIVITAMIN.  [name] NIFEdipine (PROCARDIA XL/NIFEDICAL XL) 60 MG 24 hr tablet TAKE 1 TABLET BY MOUTH  DAILY  . omeprazole (PRILOSEC) 40 MG capsule TAKE 1 CAPSULE BY MOUTH  DAILY  . potassium chloride SA (KLOR-CON) 20 MEQ tablet TAKE 1 TABLET BY MOUTH  DAILY  . rosuvastatin (CRESTOR) 20 MG tablet TAKE 1 TABLET BY MOUTH  DAILY  . Turmeric 500 MG TABS Take 1 tablet by mouth daily.  [name]  valsartan (DIOVAN) 80 MG tablet TAKE 1 TABLET BY MOUTH  DAILY  . Zinc 100 MG TABS Take 1 tablet (100 mg total) by mouth daily.  . Zinc Gluconate 50 MG CAPS Take 1 capsule (50 mg total) by mouth daily.  . metFORMIN (GLUCOPHAGE) 500 MG tablet Take 1 tablet (500 mg total) by mouth daily with breakfast for 3 days, THEN 1 tablet (500 mg total) 2 (two) times daily with a meal.   No facility-administered encounter medications on file as of 06/26/2020.     Allergies (verified) Patient has no known allergies.   History: Past Medical History:  Diagnosis Date  . AAA (abdominal aortic aneurysm) without rupture ([place]) 07/10/2014   S/p endovascular repair 2012 with stent Kellie Simmering), yearly f/u  . Arthritis   . Cancer ([place])    skin cancer non melanioma  . Chronic bronchitis ([place]) 11/2016 AND 10-2016  . COPD (chronic obstructive pulmonary disease) ([facility])    chronic bronchitis  . GERD (gastroesophageal reflux disease)   . Headache(784.0)    in 20's  . Hepatitis    Carrier cant give blood gives a false positive  . Hernia of abdominal wall    LARGE FROM BREAST BONE AREA TO BELLY BUTTON  . History of skin cancer 2014  . HTN (hypertension)   . Hyperlipidemia   . Hypogonadism in male    prior on testosterone cream  . Morbidly obese ([place])   . Pneumonia   . PONV (postoperative nausea and vomiting)    PONV AFTER AAA REPAIR, ALL OTHER SURGERIES WENT OK  . Posterior vitreous detachment 02/2018  . Shortness of breath    WITH EXERTION  . Sleep apnea    CPAP    Past Surgical History:  Procedure Laterality Date  . ABDOMINAL AORTIC ANEURYSM REPAIR  05/20/2011   enodvascular aorto bifem. stent graft Kellie Simmering)  . CATARACT EXTRACTION Bilateral 2014   X 2 BOTH EYES DONE TWICE  . COLONOSCOPY WITH PROPOFOL N/A 02/23/2017   4 TA polyps, rpt 3-5 yrs ([place])  . ELBOW SURGERY  2012  . FOOT TENDON SURGERY Right   . KNEE SURGERY Right remote   arthroscopy x2  . MASS EXCISION N/A 05/10/2013   Procedure: EXCISION back MASS - cyst Marcello Moores A. Cornett, MD)  . SHOULDER OPEN ROTATOR CUFF REPAIR Right 07/08/2017   Procedure: Right shoulder mini open rotator cuff repair;  Surgeon: Susa Day, MD;  Location: [facility];  Service: Orthopedics;  Laterality: Right;  90 mins; Interscalene Block  . SKIN SURGERY     2 different surgeries face   . UMBILICAL HERNIA REPAIR  2002   Family History  Problem Relation Age of Onset  . Cancer Mother 57       throat  (nonsmoker)  . Heart disease Maternal Aunt   . Alcohol abuse Father        deceased 62yo   Social History   Socioeconomic History  . Marital status: Married    Spouse name: Not on file  . Number of children: 3  . Years of education: Not on file  . Highest education level: Not on file  Occupational History  . Occupation: Air traffic controller.   . Occupation: works 6 months a year  Tobacco Use  . Smoking status: Former Smoker    Packs/day: 1.50    Years: 51.00    Pack years: 76.50    Types: Cigarettes    Quit date: 2013    Years since quitting: 8.8  . Smokeless tobacco:  Former User    Types: Snuff, Dorna Bloom    Quit date: 07/06/2006  Vaping Use  . Vaping Use: Never used  Substance and Sexual Activity  . Alcohol use: Yes    Alcohol/week: 0.0 standard drinks    Comment: twice a year  . Drug use: No  . Sexual activity: Yes    Partners: Female  Other Topics Concern  . 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 Difficulty of Paying Living Expenses: Not hard at all  Food Insecurity: No Food Insecurity  . Worried About Programme researcher, broadcasting/film/video in the Last Year: Never true  . Ran Out of Food in the Last Year: Never true  Transportation Needs: No Transportation Needs  . Lack of Transportation (Medical): No  . Lack of Transportation (Non-Medical): No  Physical Activity: Inactive  . Days of Exercise per Week: 0 days  . Minutes of Exercise per Session: 0 min  Stress: No Stress Concern Present  . Feeling of Stress : Not at all  Social Connections:   . Frequency of Communication with Friends and Family: Not on file  . Frequency of Social Gatherings with Friends and Family: Not on file  . Attends Religious Services: Not on file  .  Active Member of Clubs or Organizations: Not on file  . Attends Banker Meetings: Not on file  . Marital Status: Not on file    Tobacco Counseling Counseling given: Not Answered   Clinical Intake:  Pre-visit preparation completed: Yes  Pain : 0-10 Pain Score: 9  Pain Type: Chronic pain Pain Location: Shoulder Pain Orientation: Left Pain Descriptors / Indicators: Aching Pain Onset: More than a month ago Pain Frequency: Intermittent     Nutritional Risks: None Diabetes: Yes CBG done?: No Did pt. bring in CBG monitor from home?: No  How often do you need to have someone help you when you read instructions, pamphlets, or other written materials from your doctor or pharmacy?: 1 - Never What is the last grade level you completed in school?: 12th  Diabetic: Yes Nutrition Risk Assessment:  Has the patient had any N/V/D within the last 2 months?  No  Does the patient have any non-healing wounds?  No  Has the patient had any unintentional weight loss or weight gain?  No   Diabetes:  Is the patient diabetic?  Yes  If diabetic, was a CBG obtained today?  No  Did the patient bring in their glucometer from home?  No  How often do you monitor your CBG's? When needed.   Financial Strains and Diabetes Management:  Are you having any financial strains with the device, your supplies or your medication? No .  Does the patient want to be seen by Chronic Care Management for management of their diabetes?  No  Would the patient like to be referred to a Nutritionist or for Diabetic Management?  No   Diabetic Exams:  Diabetic Eye Exam: Completed 06/10/2020 Diabetic Foot Exam: Completed 03/13/2020   Interpreter Needed?: No  Information entered by :: CJohnson, LPN   Activities of Daily Living In your present state of health, do you have any difficulty performing the following activities: 06/26/2020  Hearing?  N  Vision? N  Difficulty concentrating or making  decisions? N  Walking or climbing stairs? N  Dressing or bathing? N  Doing errands, shopping? N  Preparing Food and eating ? N  Using the Toilet? N  In the past six months, have you accidently leaked urine? N  Do you have problems with loss of bowel control? N  Managing your Medications? N  Managing your Finances? N  Housekeeping or managing your Housekeeping? N  Some recent data might be hidden    Patient Care Team: Ria Bush, MD as PCP - General (Family Medicine) Adrian Prows, MD (Cardiology)  Indicate any recent Medical Services you may have received from other than [facility] providers in the past year (date may be approximate).     Assessment:   This is a routine wellness examination for [place].  Hearing/Vision screen  Hearing Screening   '125Hz'$  $[place]'250Hz'ohEdB$'500Hz'$'1000Hz'$'2000Hz'$'3000Hz'$'4000Hz'$'6000Hz'$'8000Hz'$   Right ear:           Left ear:           Vision Screening Comments: Patient gets annual eye exams   Dietary issues and exercise activities discussed: Current Exercise Habits: The patient does not participate in regular exercise at present, Exercise limited by: None identified  Goals    . Increase physical activity     Weather permitting, I will continue to swim for 60 minutes daily.     . Patient Stated     04/03/2019, no goals    . Patient Stated     06/26/2020, I will maintain and continue medications as prescribed.     . physical     When weather permits, I resume yard work at least 120 min once weekly.       Depression Screen PHQ 2/9 Scores 06/26/2020 04/03/2019 03/16/2018 11/25/2016 01/23/2016 09/10/2015 08/24/2015  PHQ - 2 Score 0 0 0 0 0 0 0  PHQ- 9 Score 0 0 0 - - - -    Fall Risk Fall Risk  06/26/2020 04/03/2019 03/16/2018 11/25/2016 01/23/2016  Falls in the past year? 0 0 No No No  Number falls in past yr: 0 - - - -  Injury with Fall? 0 - - - -  Risk for fall due to : Medication side effect Medication side effect - - -  Follow up Falls evaluation completed;Falls  prevention discussed Falls evaluation completed;Falls prevention discussed - - -    Any stairs in or around the home? Yes  If so, are there any without handrails? No  Home free of loose throw rugs in walkways, pet beds, electrical cords, etc? Yes  Adequate lighting in your home to reduce risk of falls? Yes   ASSISTIVE DEVICES UTILIZED TO PREVENT FALLS:  Life alert? No  Use of a cane, walker or w/c? No  Grab bars in the bathroom? No  Shower chair or bench in shower? No  Elevated toilet seat or a handicapped toilet? No   TIMED UP AND GO:  Was the test performed? N/A, telephonic visit.    Cognitive Function: MMSE - Mini Mental State Exam 06/26/2020 04/03/2019 03/16/2018 11/25/2016  Orientation to time $Remov'5 5 5 5  'peeMMA$ Orientation to Place $Remove'5 5 5 5  'YxNiUto$ Registration $Remov'3 3 3 3  'vcNMjr$ Attention/ Calculation 5 5 0 0  Recall $Remov'3 3 3 3  'dwyIKw$ Language- name 2 objects - 0 0 0  Language- repeat $RemoveBeforeDE'1 1 1 1  'KjIBBsUJuweRgVj$ Language- follow 3 step command - 0 3 3  Language- read & follow direction - 0 0 0  Write a sentence - 0 0 0  Copy design - 0 0 0  Total score - $Remov'22 20 20  'zemYML$ Mini Cog  Mini-Cog screen was completed. Maximum score is 22. A value of 0 denotes this part of the MMSE was not completed or the patient failed this part of the Mini-Cog screening.       Immunizations Immunization History  Administered Date(s) Administered  . PFIZER SARS-COV-2 Vaccination 06/13/2020  . Pneumococcal Conjugate-13 03/06/2014  . Pneumococcal Polysaccharide-23 09/10/2015  . Td 03/06/2014    TDAP status: Up to date Flu Vaccine status: Declined, Education has been provided regarding the importance of this vaccine but patient still declined. Advised may receive this vaccine at local pharmacy or Health Dept. Aware to provide a copy of the vaccination record if obtained from local pharmacy or Health Dept. Verbalized acceptance and understanding. Pneumococcal vaccine status: Up to date Covid-19 vaccine status: up to date  Qualifies for Shingles  Vaccine? Yes   Zostavax completed No   Shingrix Completed?: No.    Education has been provided regarding the importance of this vaccine. Patient has been advised to call insurance company to determine out of pocket expense if they have not yet received this vaccine. Advised may also receive vaccine at local pharmacy or Health Dept. Verbalized acceptance and understanding.  Screening Tests Health Maintenance  Topic Date Due  . INFLUENZA VACCINE  11/28/2020 (Originally 03/31/2020)  . COVID-19 Vaccine (2 - Pfizer 2-dose series) 07/04/2020  . COLONOSCOPY  02/23/2022  . TETANUS/TDAP  03/06/2024  . Hepatitis C Screening  Completed  . PNA vac Low Risk Adult  Completed    Health Maintenance  There are no preventive care reminders to display for this patient.  Colorectal cancer screening: Completed 02/23/2017. Repeat every 5 years  Lung Cancer Screening: (Low Dose CT Chest recommended if Age 39-80 years, 30 pack-year currently smoking OR have quit w/in 15years.) does not qualify.    Additional Screening:  Hepatitis C Screening: does qualify; Completed 09/10/2015  Vision Screening: Recommended annual ophthalmology exams for early detection of glaucoma and other disorders of the eye. Is the patient up to date with their annual eye exam?  Yes  Who is the provider or what is the name of the office in which the patient attends annual eye exams? [facility]  If pt is not established with a provider, would they like to be referred to a provider to establish care? No .   Dental Screening: Recommended annual dental exams for proper oral hygiene  Community Resource Referral / Chronic Care Management: CRR required this visit?  No   CCM required this visit?  No      Plan:     I have personally reviewed and noted the following in the patient's chart:   . Medical and social history . Use of alcohol, tobacco or illicit drugs  . Current medications and supplements . Functional ability and  status . Nutritional status . Physical activity . Advanced directives . List of other physicians . Hospitalizations, surgeries, and ER visits in previous 12 months . Vitals . Screenings to include cognitive, depression, and falls . Referrals and appointments  In addition, I have reviewed and discussed with patient certain preventive protocols, quality metrics, and best practice recommendations. A written personalized care plan for preventive services as well as general preventive health recommendations were provided to patient.   Due to this being a telephonic visit,  the after visit summary with patients personalized plan was offered to patient via office or my-chart.  Patient preferred to pick up at office at next visit or via mychart.   Andrez Grime, LPN   [id number]

## 2020-06-26 NOTE — Progress Notes (Signed)
PCP notes:  Health Maintenance: Flu- declined   Abnormal Screenings: none   Patient concerns: Discuss nasal congestion Discuss bilateral foot edema    Nurse concerns: none   Next PCP appt.: 07/01/2020 @ 3:30 pm

## 2020-07-01 ENCOUNTER — Encounter: Payer: Self-pay | Admitting: Family Medicine

## 2020-07-01 ENCOUNTER — Other Ambulatory Visit: Payer: Self-pay

## 2020-07-01 ENCOUNTER — Ambulatory Visit (INDEPENDENT_AMBULATORY_CARE_PROVIDER_SITE_OTHER): Payer: Medicare Other | Admitting: Family Medicine

## 2020-07-01 VITALS — BP 130/82 | HR 67 | Temp 97.5°F | Ht 70.5 in | Wt 312.5 lb

## 2020-07-01 DIAGNOSIS — Z0000 Encounter for general adult medical examination without abnormal findings: Secondary | ICD-10-CM | POA: Diagnosis not present

## 2020-07-01 DIAGNOSIS — E559 Vitamin D deficiency, unspecified: Secondary | ICD-10-CM | POA: Diagnosis not present

## 2020-07-01 DIAGNOSIS — I714 Abdominal aortic aneurysm, without rupture, unspecified: Secondary | ICD-10-CM

## 2020-07-01 DIAGNOSIS — Z125 Encounter for screening for malignant neoplasm of prostate: Secondary | ICD-10-CM

## 2020-07-01 DIAGNOSIS — I10 Essential (primary) hypertension: Secondary | ICD-10-CM

## 2020-07-01 DIAGNOSIS — G4733 Obstructive sleep apnea (adult) (pediatric): Secondary | ICD-10-CM

## 2020-07-01 DIAGNOSIS — E1165 Type 2 diabetes mellitus with hyperglycemia: Secondary | ICD-10-CM | POA: Diagnosis not present

## 2020-07-01 DIAGNOSIS — J449 Chronic obstructive pulmonary disease, unspecified: Secondary | ICD-10-CM

## 2020-07-01 DIAGNOSIS — R053 Chronic cough: Secondary | ICD-10-CM

## 2020-07-01 DIAGNOSIS — Z9889 Other specified postprocedural states: Secondary | ICD-10-CM

## 2020-07-01 DIAGNOSIS — R0981 Nasal congestion: Secondary | ICD-10-CM

## 2020-07-01 DIAGNOSIS — IMO0002 Reserved for concepts with insufficient information to code with codable children: Secondary | ICD-10-CM

## 2020-07-01 DIAGNOSIS — Z8679 Personal history of other diseases of the circulatory system: Secondary | ICD-10-CM

## 2020-07-01 DIAGNOSIS — E118 Type 2 diabetes mellitus with unspecified complications: Secondary | ICD-10-CM | POA: Diagnosis not present

## 2020-07-01 DIAGNOSIS — E1169 Type 2 diabetes mellitus with other specified complication: Secondary | ICD-10-CM

## 2020-07-01 DIAGNOSIS — Z87891 Personal history of nicotine dependence: Secondary | ICD-10-CM

## 2020-07-01 DIAGNOSIS — Z6841 Body Mass Index (BMI) 40.0 and over, adult: Secondary | ICD-10-CM

## 2020-07-01 DIAGNOSIS — E785 Hyperlipidemia, unspecified: Secondary | ICD-10-CM

## 2020-07-01 DIAGNOSIS — R600 Localized edema: Secondary | ICD-10-CM

## 2020-07-01 LAB — POCT GLYCOSYLATED HEMOGLOBIN (HGB A1C): Hemoglobin A1C: 6.4 % — AB (ref 4.0–5.6)

## 2020-07-01 MED ORDER — METFORMIN HCL 500 MG PO TABS: 500.0000 mg | ORAL_TABLET | Freq: Every day | ORAL | 3 refills | Status: DC

## 2020-07-01 MED ORDER — NIFEDIPINE ER OSMOTIC RELEASE 60 MG PO TB24
60.0000 mg | ORAL_TABLET | Freq: Every day | ORAL | 3 refills | Status: DC
Start: 2020-07-01 — End: 2021-07-08

## 2020-07-01 MED ORDER — FUROSEMIDE 40 MG PO TABS
40.0000 mg | ORAL_TABLET | Freq: Two times a day (BID) | ORAL | 3 refills | Status: DC
Start: 2020-07-01 — End: 2021-01-08

## 2020-07-01 MED ORDER — LABETALOL HCL 200 MG PO TABS
200.0000 mg | ORAL_TABLET | Freq: Every day | ORAL | 3 refills | Status: DC
Start: 2020-07-01 — End: 2021-07-08

## 2020-07-01 MED ORDER — POTASSIUM CHLORIDE CRYS ER 20 MEQ PO TBCR
20.0000 meq | EXTENDED_RELEASE_TABLET | Freq: Every day | ORAL | 3 refills | Status: DC
Start: 2020-07-01 — End: 2021-07-08

## 2020-07-01 MED ORDER — OMEPRAZOLE 40 MG PO CPDR
40.0000 mg | DELAYED_RELEASE_CAPSULE | Freq: Every day | ORAL | 3 refills | Status: DC
Start: 2020-07-01 — End: 2021-07-20

## 2020-07-01 NOTE — Progress Notes (Signed)
This visit was conducted in person.  BP 130/82 (BP Location: Left Arm, Patient Position: Sitting, Cuff Size: Large)   Pulse 67   Temp (!) 97.5 F (36.4 C) (Temporal)   Ht 5' 10.5" (1.791 m)   Wt (!) 312 lb 8 oz (141.7 kg)   SpO2 95%   BMI 44.21 kg/m    CC: CPE Subjective:    Patient ID: William Hebert, male    DOB: 1946-02-23, 74 y.o.   MRN: [id number]  HPI: William Hebert is a 74 y.o. male presenting on 07/01/2020 for Annual Exam (Prt 2.)   Saw health advisor last week for medicare wellness visit. Note reviewed.   Ongoing chronic nasal congestion and cough for months. No significant chest congestion. Wonders if allergic to certain condiments as nose runs when he eats this. This may be better since starting CPAP cleaner. No dyspnea or wheezing. No ST, fever, HA.  Notes L>R foot swelling.  Saw eye doctor ([place]) - good report.  Not fasting today.   To see ortho Dr Maxie Better tomorrow for L shoulder pain concern for RTC tear.   No exam data present    Clinical Support from 06/26/2020 in [place] at [facility] Total Score 0      Fall Risk  06/26/2020 04/03/2019 03/16/2018 11/25/2016 01/23/2016  Falls in the past year? 0 0 No No No  Number falls in past yr: 0 - - - -  Injury with Fall? 0 - - - -  Risk for fall due to : Medication side effect Medication side effect - - -  Follow up Falls evaluation completed;Falls prevention discussed Falls evaluation completed;Falls prevention discussed - - -    Recent diagnosis T2DM earlier this summer started on metformin $RemoveBefo'500mg'oXsoDMgTxRl$  bid. Gave up sweet tea (previously 1/2 gallon/day) and white bread with ongoing weight loss.  Lab Results  Component Value Date   HGBA1C 6.4 (A) 07/01/2020    Preventative: COLONOSCOPY WITH PROPOFOL 02/23/2017-4 TA polyps, rpt 3-5 yrs ([place]) Prostate cancer screening -declines DRE.checks with PSA.  Lung cancer screening -eligible, had done 2019, agrees to return. New referral placed.  Flu shot  -not done  Td 2015  Pneumovax 2017, prevnar 2015  Shingrix - declines- has had shingles  COVID vaccine Pfizer 05/2020  Advanced directive discussion -would want daughter [name] to be HCPOA- also would want wife.Packet previously provided - encouraged he work on this.  Seat belt use discussed  Sunscreen usediscussed. No changing moles on skin.Needs to see derm. Ex smoker - quit ~2007. Prior smoked 2 ppd. ~75 PY hx Alcohol - rarely Dentist - Q69mo  Eye exam yearly  Bowel - no diarrhea/constipation Bladder - no incontinence  Lives with wife Occ: semi-retired Corporate treasurer, works for [employer] Edu: HS Activity: swimming 3x/wk Diet: no water,1/2 gallonsweet tea- decreasing amt sugar in it though, some fruits/vegetables     Relevant past medical, surgical, family and social history reviewed and updated as indicated. Interim medical history since our last visit reviewed. Allergies and medications reviewed and updated. Outpatient Medications Prior to Visit  Medication Sig Dispense Refill  . Accu-Chek FastClix Lancets MISC Use as instructed to check blood sugar once daily. 100 each 0  . albuterol (PROVENTIL HFA;VENTOLIN HFA) 108 (90 Base) MCG/ACT inhaler Inhale 1-2 puffs into the lungs every 4 (four) hours as needed for wheezing or shortness of breath.    . Ascorbic Acid (VITAMIN C PO) Take 1,000 mg by mouth daily.    [name]  aspirin 81 MG chewable tablet Chew daily by mouth.    [name] b complex vitamins capsule Take 1 capsule by mouth daily.    . Blood Glucose Monitoring Suppl (ACCU-CHEK GUIDE) w/Device KIT Use as instructed to check blood sugar once daily. 1 kit 0  . Cholecalciferol (VITAMIN D3) 1.25 MG (50000 UT) CAPS TAKE 50,000 UNITS (1 CAPSULE) BY MOUTH EVERY FRIDAY. IN THE MORNING. 12 capsule 0  . glucose blood (ACCU-CHEK GUIDE) test strip Use as instructed to check blood sugar once daily. 100 each 1  . glucose blood (ACCU-CHEK GUIDE) test strip Use as instructed 100  each 12  . ibuprofen (ADVIL) 600 MG tablet Take 1 tablet (600 mg total) by mouth every 8 (eight) hours as needed for moderate pain. 30 tablet 0  . methocarbamol (ROBAXIN) 500 MG tablet Take 1-2 tablets (500-1,000 mg total) by mouth 2 (two) times daily as needed for muscle spasms (sedation precautions). 40 tablet 0  . Multiple Vitamins-Minerals (MULTIVITAMIN ADULT PO) Take 1 tablet by mouth daily. MEN'S 50+ MULTIVITAMIN.    . rosuvastatin (CRESTOR) 20 MG tablet TAKE 1 TABLET BY MOUTH  DAILY 90 tablet 3  . Turmeric 500 MG TABS Take 1 tablet by mouth daily.    . valsartan (DIOVAN) 80 MG tablet TAKE 1 TABLET BY MOUTH  DAILY 90 tablet 3  . Zinc 100 MG TABS Take 1 tablet (100 mg total) by mouth daily. 90 tablet 3  . Zinc Gluconate 50 MG CAPS Take 1 capsule (50 mg total) by mouth daily. 90 capsule 3  . furosemide (LASIX) 40 MG tablet TAKE 1 TABLET BY MOUTH  TWICE DAILY 180 tablet 0  . labetalol (NORMODYNE) 200 MG tablet TAKE 1 TABLET BY MOUTH  DAILY 90 tablet 0  . metFORMIN (GLUCOPHAGE) 500 MG tablet Take 1 tablet (500 mg total) by mouth daily with breakfast for 3 days, THEN 1 tablet (500 mg total) 2 (two) times daily with a meal. 60 tablet 3  . NIFEdipine (PROCARDIA XL/NIFEDICAL XL) 60 MG 24 hr tablet TAKE 1 TABLET BY MOUTH  DAILY 90 tablet 0  . omeprazole (PRILOSEC) 40 MG capsule TAKE 1 CAPSULE BY MOUTH  DAILY 90 capsule 0  . potassium chloride SA (KLOR-CON) 20 MEQ tablet TAKE 1 TABLET BY MOUTH  DAILY 90 tablet 0  . Accu-Chek Softclix Lancets lancets Use as instructed 100 each 12   No facility-administered medications prior to visit.     Per HPI unless specifically indicated in ROS section below Review of Systems  Constitutional: Negative for activity change, appetite change, chills, fatigue, fever and unexpected weight change.  HENT: Positive for congestion. Negative for hearing loss.   Eyes: Negative for visual disturbance.  Respiratory: Positive for cough. Negative for chest tightness,  shortness of breath and wheezing.   Cardiovascular: Positive for leg swelling (left side, chronic). Negative for chest pain and palpitations.  Gastrointestinal: Negative for abdominal distention, abdominal pain, blood in stool, constipation, diarrhea, nausea and vomiting.  Genitourinary: Negative for difficulty urinating and hematuria.  Musculoskeletal: Negative for arthralgias, myalgias and neck pain.  Skin: Negative for rash.  Neurological: Positive for dizziness (some orthostatic). Negative for seizures, syncope and headaches.  Hematological: Negative for adenopathy. Does not bruise/bleed easily.  Psychiatric/Behavioral: Negative for dysphoric mood. The patient is not nervous/anxious.    Objective:  BP 130/82 (BP Location: Left Arm, Patient Position: Sitting, Cuff Size: Large)   Pulse 67   Temp (!) 97.5 F (36.4 C) (Temporal)   Ht 5' 10.5" (1.791  m)   Wt (!) 312 lb 8 oz (141.7 kg)   SpO2 95%   BMI 44.21 kg/m   Wt Readings from Last 3 Encounters:  07/01/20 (!) 312 lb 8 oz (141.7 kg)  04/24/20 (!) 316 lb 2 oz (143.4 kg)  03/13/20 (!) 317 lb 4 oz (143.9 kg)      Physical Exam Vitals and nursing note reviewed.  Constitutional:      General: He is not in acute distress.    Appearance: Normal appearance. He is well-developed. He is obese. He is not ill-appearing.  HENT:     Head: Normocephalic and atraumatic.     Right Ear: Hearing, tympanic membrane, ear canal and external ear normal.     Left Ear: Hearing, tympanic membrane, ear canal and external ear normal.  Eyes:     General: No scleral icterus.    Extraocular Movements: Extraocular movements intact.     Conjunctiva/sclera: Conjunctivae normal.     Pupils: Pupils are equal, round, and reactive to light.  Neck:     Thyroid: No thyroid mass or thyromegaly.  Cardiovascular:     Rate and Rhythm: Normal rate and regular rhythm.     Pulses: Normal pulses.          Radial pulses are 2+ on the right side and 2+ on the left  side.     Heart sounds: Normal heart sounds. No murmur heard.   Pulmonary:     Effort: Pulmonary effort is normal. No respiratory distress.     Breath sounds: No wheezing, rhonchi or rales.     Comments: Bibasilar crackles Abdominal:     General: Abdomen is flat. Bowel sounds are normal. There is no distension.     Palpations: Abdomen is soft. There is no mass.     Tenderness: There is no abdominal tenderness. There is no guarding or rebound.     Hernia: No hernia is present.  Musculoskeletal:        General: Normal range of motion.     Cervical back: Normal range of motion and neck supple.     Right lower leg: Edema present.     Left lower leg: Edema present.     Comments:  R lower leg actually more swollen than left 2+ DP on left, did not measure right No significant foot edema today  Lymphadenopathy:     Cervical: No cervical adenopathy.  Skin:    General: Skin is warm and dry.     Findings: No rash.  Neurological:     General: No focal deficit present.     Mental Status: He is alert and oriented to person, place, and time.     Comments: CN grossly intact, station and gait intact  Psychiatric:        Mood and Affect: Mood normal.        Behavior: Behavior normal.        Thought Content: Thought content normal.        Judgment: Judgment normal.       Results for orders placed or performed in visit on 07/01/20  POCT glycosylated hemoglobin (Hb A1C)  Result Value Ref Range   Hemoglobin A1C 6.4 (A) 4.0 - 5.6 %   HbA1c POC (<> result, manual entry)     HbA1c, POC (prediabetic range)     HbA1c, POC (controlled diabetic range)     Assessment & Plan:  This visit occurred during the SARS-CoV-2 public health emergency.  Safety protocols were in  place, including screening questions prior to the visit, additional usage of staff PPE, and extensive cleaning of exam room while observing appropriate contact time as indicated for disinfecting solutions.   Problem List Items  Addressed This Visit    Vitamin D deficiency    Update levels on vit D weekly replacement       Relevant Orders   VITAMIN D 25 Hydroxy (Vit-D Deficiency, Fractures)   Uncontrolled diabetes mellitus with complication, without long-term current use of insulin (HCC)    Chronic, marked improvement this year, now A1c in controlled range. Continue once daily metformin and healthy diet choices       Relevant Medications   metFORMIN (GLUCOPHAGE) 500 MG tablet   Other Relevant Orders   POCT glycosylated hemoglobin (Hb A1C) (Completed)   S/P AAA (abdominal aortic aneurysm) repair   Pedal edema    Chronic, stable. H/o R ankle fracture surgery and today leg swelling is more evident on right however he endorses dependent L foot edema - not significant today. Good pulses. Discussed anticipate CVI related - rec avoidance of salt, good water intake, leg elevation, and compression stocking use - he declines compression stockings      Obstructive sleep apnea    Continue CPAP use.       Morbid obesity with BMI of 40.0-44.9, adult ([place])    Encouraged ongoing efforts for a healthy diet and activity for sustainable weight loss.       Relevant Medications   metFORMIN (GLUCOPHAGE) 500 MG tablet   Hypertension    Chronic, stable. Continue lasix, labetalol, procardia, valsartan.       Relevant Medications   furosemide (LASIX) 40 MG tablet   labetalol (NORMODYNE) 200 MG tablet   NIFEdipine (PROCARDIA XL/NIFEDICAL XL) 60 MG 24 hr tablet   Health maintenance examination - Primary    Preventative protocols reviewed and updated unless pt declined. Discussed healthy diet and lifestyle.       Ex-smoker    Referred back to lung cancer screening program. Last CT done 2019.       Relevant Orders   Ambulatory Referral for Lung Cancer Scre   Dyslipidemia associated with type 2 diabetes mellitus ([place])    Continue crestor. Check FLP when he returns fasting. The 10-year ASCVD risk score Mikey Bussing DC Brooke Bonito., et  al., 2013) is: 47.8%   Values used to calculate the score:     Age: 72 years     Sex: Male     Is Non-Hispanic African American: No     Diabetic: Yes     Tobacco smoker: No     Systolic Blood Pressure: 301 mmHg     Is BP treated: Yes     HDL Cholesterol: 28 mg/dL     Total Cholesterol: 177 mg/dL       Relevant Medications   metFORMIN (GLUCOPHAGE) 500 MG tablet   Other Relevant Orders   Lipid panel   Comprehensive metabolic panel   COPD (chronic obstructive pulmonary disease) (HCC)    Continue PRN albuterol.       Chronic cough    Ongoing. rec trial mucinex. No signs of bacterial infection at this time.       AAA (abdominal aortic aneurysm) without rupture ([place])    Monitored yearly by cardiology.       Relevant Medications   furosemide (LASIX) 40 MG tablet   labetalol (NORMODYNE) 200 MG tablet   NIFEdipine (PROCARDIA XL/NIFEDICAL XL) 60 MG 24 hr tablet    Other  Visit Diagnoses    Special screening for malignant neoplasm of prostate       Relevant Orders   PSA       Meds ordered this encounter  Medications  . metFORMIN (GLUCOPHAGE) 500 MG tablet    Sig: Take 1 tablet (500 mg total) by mouth daily with breakfast.    Dispense:  90 tablet    Refill:  3  . furosemide (LASIX) 40 MG tablet    Sig: Take 1 tablet (40 mg total) by mouth 2 (two) times daily.    Dispense:  180 tablet    Refill:  3  . labetalol (NORMODYNE) 200 MG tablet    Sig: Take 1 tablet (200 mg total) by mouth daily.    Dispense:  90 tablet    Refill:  3  . NIFEdipine (PROCARDIA XL/NIFEDICAL XL) 60 MG 24 hr tablet    Sig: Take 1 tablet (60 mg total) by mouth daily.    Dispense:  90 tablet    Refill:  3  . omeprazole (PRILOSEC) 40 MG capsule    Sig: Take 1 capsule (40 mg total) by mouth daily.    Dispense:  90 capsule    Refill:  3  . potassium chloride SA (KLOR-CON) 20 MEQ tablet    Sig: Take 1 tablet (20 mEq total) by mouth daily.    Dispense:  90 tablet    Refill:  3   Orders Placed This  Encounter  Procedures  . Lipid panel    Standing Status:   Future    Standing Expiration Date:   07/02/2021  . Comprehensive metabolic panel    Standing Status:   Future    Standing Expiration Date:   07/02/2021  . PSA    Standing Status:   Future    Standing Expiration Date:   07/02/2021  . VITAMIN D 25 Hydroxy (Vit-D Deficiency, Fractures)    Standing Status:   Future    Standing Expiration Date:   07/02/2021  . Ambulatory Referral for Lung Cancer Scre    Referral Priority:   Routine    Referral Type:   Consultation    Referral Reason:   Specialty Services Required    Number of Visits Requested:   1  . POCT glycosylated hemoglobin (Hb A1C)    Patient instructions: Return at your convenience for fasting labs (4 hour fast).  Return in 6 months for diabetes check.  In the new year call Dr Lorie Apley office to ask about due date for next colonoscopy.  For leg swelling - I think this is due to edema, not lymphedema - from chronic venous insufficiency or veins not working as well as they should. Treatment for this is avoiding salt, drinking plenty of water, leg elevation, and compression stockings.  For chronic congestion - try fast relief mucinex (active ingredient guaifenesin) with large glass of water to break up mucous. Or try extended release for less frequent dosing.   Follow up plan: Return in about 6 months (around 12/29/2020), or if symptoms worsen or fail to improve, for follow up visit.  Ria Bush, MD

## 2020-07-01 NOTE — Patient Instructions (Addendum)
Return at your convenience for fasting labs (4 hour fast).  Return in 6 months for diabetes check.  In the new year call Dr Lorie Apley office to ask about due date for next colonoscopy.  For leg swelling - I think this is due to edema, not lymphedema - from chronic venous insufficiency or veins not working as well as they should. Treatment for this is avoiding salt, drinking plenty of water, leg elevation, and compression stockings.  For chronic congestion - try fast relief mucinex (active ingredient guaifenesin) with large glass of water to break up mucous. Or try extended release for less frequent dosing.   Health Maintenance After Age 61 After age 14, you are at a higher risk for certain long-term diseases and infections as well as injuries from falls. Falls are a major cause of broken bones and head injuries in people who are older than age 35. Getting regular preventive care can help to keep you healthy and well. Preventive care includes getting regular testing and making lifestyle changes as recommended by your health care provider. Talk with your health care provider about:  Which screenings and tests you should have. A screening is a test that checks for a disease when you have no symptoms.  A diet and exercise plan that is right for you. What should I know about screenings and tests to prevent falls? Screening and testing are the best ways to find a health problem early. Early diagnosis and treatment give you the best chance of managing medical conditions that are common after age 99. Certain conditions and lifestyle choices may make you more likely to have a fall. Your health care provider may recommend:  Regular vision checks. Poor vision and conditions such as cataracts can make you more likely to have a fall. If you wear glasses, make sure to get your prescription updated if your vision changes.  Medicine review. Work with your health care provider to regularly review all of the medicines  you are taking, including over-the-counter medicines. Ask your health care provider about any side effects that may make you more likely to have a fall. Tell your health care provider if any medicines that you take make you feel dizzy or sleepy.  Osteoporosis screening. Osteoporosis is a condition that causes the bones to get weaker. This can make the bones weak and cause them to break more easily.  Blood pressure screening. Blood pressure changes and medicines to control blood pressure can make you feel dizzy.  Strength and balance checks. Your health care provider may recommend certain tests to check your strength and balance while standing, walking, or changing positions.  Foot health exam. Foot pain and numbness, as well as not wearing proper footwear, can make you more likely to have a fall.  Depression screening. You may be more likely to have a fall if you have a fear of falling, feel emotionally low, or feel unable to do activities that you used to do.  Alcohol use screening. Using too much alcohol can affect your balance and may make you more likely to have a fall. What actions can I take to lower my risk of falls? General instructions  Talk with your health care provider about your risks for falling. Tell your health care provider if: ? You fall. Be sure to tell your health care provider about all falls, even ones that seem minor. ? You feel dizzy, sleepy, or off-balance.  Take over-the-counter and prescription medicines only as told by your health care  provider. These include any supplements.  Eat a healthy diet and maintain a healthy weight. A healthy diet includes low-fat dairy products, low-fat (lean) meats, and fiber from whole grains, beans, and lots of fruits and vegetables. Home safety  Remove any tripping hazards, such as rugs, cords, and clutter.  Install safety equipment such as grab bars in bathrooms and safety rails on stairs.  Keep rooms and walkways  well-lit. Activity   Follow a regular exercise program to stay fit. This will help you maintain your balance. Ask your health care provider what types of exercise are appropriate for you.  If you need a cane or walker, use it as recommended by your health care provider.  Wear supportive shoes that have nonskid soles. Lifestyle  Do not drink alcohol if your health care provider tells you not to drink.  If you drink alcohol, limit how much you have: ? 0-1 drink a day for women. ? 0-2 drinks a day for men.  Be aware of how much alcohol is in your drink. In the [place] one drink equals one typical bottle of beer (12 oz), one-half glass of wine (5 oz), or one shot of hard liquor (1 oz).  Do not use any products that contain nicotine or tobacco, such as cigarettes and e-cigarettes. If you need help quitting, ask your health care provider. Summary  Having a healthy lifestyle and getting preventive care can help to protect your health and wellness after age 62.  Screening and testing are the best way to find a health problem early and help you avoid having a fall. Early diagnosis and treatment give you the best chance for managing medical conditions that are more common for people who are older than age 11.  Falls are a major cause of broken bones and head injuries in people who are older than age 40. Take precautions to prevent a fall at home.  Work with your health care provider to learn what changes you can make to improve your health and wellness and to prevent falls. This information is not intended to replace advice given to you by your health care provider. Make sure you discuss any questions you have with your health care provider. Document Revised: 12/08/2018 Document Reviewed: 06/30/2017 Elsevier Patient Education  2020 [employer].

## 2020-07-02 DIAGNOSIS — R0981 Nasal congestion: Secondary | ICD-10-CM | POA: Insufficient documentation

## 2020-07-02 NOTE — Assessment & Plan Note (Signed)
Chronic, stable. H/o R ankle fracture surgery and today leg swelling is more evident on right however he endorses dependent L foot edema - not significant today. Good pulses. Discussed anticipate CVI related - rec avoidance of salt, good water intake, leg elevation, and compression stocking use - he declines compression stockings

## 2020-07-02 NOTE — Assessment & Plan Note (Signed)
Monitored yearly by cardiology.

## 2020-07-02 NOTE — Assessment & Plan Note (Signed)
Update levels on vit D weekly replacement.  

## 2020-07-02 NOTE — Assessment & Plan Note (Signed)
Chronic, stable. Continue lasix, labetalol, procardia, valsartan.

## 2020-07-02 NOTE — Assessment & Plan Note (Signed)
Continue crestor. Check FLP when he returns fasting. The 10-year ASCVD risk score Mikey Bussing DC Brooke Bonito., et al., 2013) is: 47.8%   Values used to calculate the score:     Age: 74 years     Sex: Male     Is Non-Hispanic African American: No     Diabetic: Yes     Tobacco smoker: No     Systolic Blood Pressure: 343 mmHg     Is BP treated: Yes     HDL Cholesterol: 28 mg/dL     Total Cholesterol: 177 mg/dL

## 2020-07-02 NOTE — Assessment & Plan Note (Signed)
Preventative protocols reviewed and updated unless pt declined. Discussed healthy diet and lifestyle.  

## 2020-07-02 NOTE — Assessment & Plan Note (Signed)
Continue PRN albuterol.  

## 2020-07-02 NOTE — Assessment & Plan Note (Signed)
Continue CPAP use

## 2020-07-02 NOTE — Assessment & Plan Note (Signed)
Chronic. No signs of bacterial infection - rec trial mucinex.

## 2020-07-02 NOTE — Assessment & Plan Note (Signed)
Referred back to lung cancer screening program. Last CT done 2019.

## 2020-07-02 NOTE — Assessment & Plan Note (Signed)
Chronic, marked improvement this year, now A1c in controlled range. Continue once daily metformin and healthy diet choices

## 2020-07-02 NOTE — Assessment & Plan Note (Signed)
Ongoing. rec trial mucinex. No signs of bacterial infection at this time.

## 2020-07-02 NOTE — Assessment & Plan Note (Signed)
Encouraged ongoing efforts for a healthy diet and activity for sustainable weight loss.

## 2020-07-03 ENCOUNTER — Encounter: Payer: Self-pay | Admitting: Family Medicine

## 2020-07-03 ENCOUNTER — Other Ambulatory Visit: Payer: Self-pay | Admitting: Cardiology

## 2020-07-03 DIAGNOSIS — M25512 Pain in left shoulder: Secondary | ICD-10-CM | POA: Insufficient documentation

## 2020-07-09 ENCOUNTER — Telehealth: Payer: Self-pay | Admitting: *Deleted

## 2020-07-09 ENCOUNTER — Encounter: Payer: Self-pay | Admitting: *Deleted

## 2020-07-09 DIAGNOSIS — Z87891 Personal history of nicotine dependence: Secondary | ICD-10-CM

## 2020-07-09 DIAGNOSIS — Z122 Encounter for screening for malignant neoplasm of respiratory organs: Secondary | ICD-10-CM

## 2020-07-09 NOTE — Telephone Encounter (Signed)
Attempted to contact and schedule lung screening scan. Message left for patient to call back to schedule. 

## 2020-07-10 ENCOUNTER — Telehealth: Payer: Self-pay | Admitting: Acute Care

## 2020-07-15 NOTE — Telephone Encounter (Signed)
Contacted and scheduled. Patient is a former smoker, quit 2013, 76.5 pack year history.

## 2020-07-15 NOTE — Telephone Encounter (Signed)
Raquel Sarna, It looks like this pt is returning your call.

## 2020-07-15 NOTE — Addendum Note (Signed)
Addended by: Lieutenant Diego on: 07/15/2020 12:24 PM   Modules accepted: Orders

## 2020-07-17 NOTE — Telephone Encounter (Signed)
Yes, thank you. He has been contacted and scheduled.  thanks

## 2020-07-20 ENCOUNTER — Other Ambulatory Visit: Payer: Self-pay | Admitting: Family Medicine

## 2020-07-22 ENCOUNTER — Other Ambulatory Visit: Payer: Self-pay | Admitting: Cardiology

## 2020-07-23 ENCOUNTER — Encounter: Payer: Self-pay | Admitting: Family Medicine

## 2020-07-23 NOTE — Telephone Encounter (Signed)
Pts wife (DPR signed) left v/m with the information that she has already listed in pt message to Dr Danise Mina.

## 2020-07-24 ENCOUNTER — Other Ambulatory Visit (INDEPENDENT_AMBULATORY_CARE_PROVIDER_SITE_OTHER): Payer: Medicare Other

## 2020-07-24 ENCOUNTER — Ambulatory Visit
Admission: RE | Admit: 2020-07-24 | Discharge: 2020-07-24 | Disposition: A | Discharge status: Home / Self Care | Payer: Medicare Other | Source: Ambulatory Visit | Attending: Nurse Practitioner | Admitting: Nurse Practitioner

## 2020-07-24 ENCOUNTER — Other Ambulatory Visit: Payer: Self-pay

## 2020-07-24 DIAGNOSIS — Z122 Encounter for screening for malignant neoplasm of respiratory organs: Secondary | ICD-10-CM | POA: Diagnosis present

## 2020-07-24 DIAGNOSIS — E559 Vitamin D deficiency, unspecified: Secondary | ICD-10-CM

## 2020-07-24 DIAGNOSIS — Z125 Encounter for screening for malignant neoplasm of prostate: Secondary | ICD-10-CM

## 2020-07-24 DIAGNOSIS — Z87891 Personal history of nicotine dependence: Secondary | ICD-10-CM | POA: Diagnosis not present

## 2020-07-24 DIAGNOSIS — E1169 Type 2 diabetes mellitus with other specified complication: Secondary | ICD-10-CM

## 2020-07-24 DIAGNOSIS — E785 Hyperlipidemia, unspecified: Secondary | ICD-10-CM | POA: Diagnosis not present

## 2020-07-24 LAB — LIPID PANEL
Cholesterol: 115 mg/dL (ref 0–200)
HDL: 36.2 mg/dL — ABNORMAL LOW (ref >39.00)
LDL Cholesterol: 48 mg/dL (ref 0–99)
NonHDL: 78.74
Total CHOL/HDL Ratio: 3
Triglycerides: 154 mg/dL — ABNORMAL HIGH (ref 0.0–149.0)
VLDL: 30.8 mg/dL (ref 0.0–40.0)

## 2020-07-24 LAB — COMPREHENSIVE METABOLIC PANEL
ALT: 18 U/L (ref 0–53)
AST: 15 U/L (ref 0–37)
Albumin: 3.8 g/dL (ref 3.5–5.2)
Alkaline Phosphatase: 65 U/L (ref 39–117)
BUN: 15 mg/dL (ref 6–23)
CO2: 29 mEq/L (ref 19–32)
Calcium: 9.1 mg/dL (ref 8.4–10.5)
Chloride: 103 mEq/L (ref 96–112)
Creatinine, Ser: 1.27 mg/dL (ref 0.40–1.50)
GFR: 56.33 mL/min — ABNORMAL LOW (ref >60.00)
Glucose, Bld: 112 mg/dL — ABNORMAL HIGH (ref 70–99)
Potassium: 3.5 mEq/L (ref 3.5–5.1)
Sodium: 140 mEq/L (ref 135–145)
Total Bilirubin: 0.4 mg/dL (ref 0.2–1.2)
Total Protein: 5.9 g/dL — ABNORMAL LOW (ref 6.0–8.3)

## 2020-07-24 LAB — VITAMIN D 25 HYDROXY (VIT D DEFICIENCY, FRACTURES): VITD: 35.82 ng/mL (ref 30.00–100.00)

## 2020-07-24 LAB — PSA: PSA: 0.79 ng/mL (ref 0.10–4.00)

## 2020-07-29 ENCOUNTER — Other Ambulatory Visit: Payer: Self-pay

## 2020-07-29 MED ORDER — VALSARTAN 80 MG PO TABS
80.0000 mg | ORAL_TABLET | Freq: Every day | ORAL | 2 refills | Status: DC
Start: 2020-07-29 — End: 2021-07-04

## 2020-07-29 MED ORDER — ROSUVASTATIN CALCIUM 20 MG PO TABS
20.0000 mg | ORAL_TABLET | Freq: Every day | ORAL | 2 refills | Status: DC
Start: 2020-07-29 — End: 2021-02-21

## 2020-07-29 NOTE — Telephone Encounter (Signed)
He has not seen me in > 1.5 years. Next time refills with PCP is fine and can f/u PRN with [place]. I send him a message as well.

## 2020-07-30 NOTE — Telephone Encounter (Signed)
Replied via lab result section. 

## 2020-07-31 ENCOUNTER — Encounter: Payer: Self-pay | Admitting: *Deleted

## 2020-10-07 ENCOUNTER — Other Ambulatory Visit: Payer: Self-pay | Admitting: Family Medicine

## 2020-10-08 NOTE — Telephone Encounter (Signed)
Pharmacy requests refill on: Vitamin D3-50 50,000  LAST REFILL: 07/22/2020 (Q-12, R-0) LAST OV: 07/01/2020 NEXT OV: Not Scheduled  PHARMACY: [facility] [place], [place]   Earliest [place] Date: 10/22/2020

## 2020-11-01 ENCOUNTER — Other Ambulatory Visit: Payer: Self-pay | Admitting: Family Medicine

## 2020-11-08 ENCOUNTER — Telehealth: Payer: Self-pay | Admitting: Family Medicine

## 2020-11-08 NOTE — Progress Notes (Signed)
  Chronic Care Management   Outreach Note  11/08/2020 Name: GIANG HEMME MRN: [id number] DOB: 05/09/1946  Referred by: Ria Bush, MD Reason for referral : No chief complaint on file.   An unsuccessful telephone outreach was attempted today. The patient was referred to the pharmacist for assistance with care management and care coordination.   Follow Up Plan:   Lauretta Grill Upstream Scheduler

## 2020-11-12 ENCOUNTER — Telehealth: Payer: Self-pay | Admitting: Family Medicine

## 2020-11-12 NOTE — Chronic Care Management (AMB) (Signed)
  Chronic Care Management   Note  11/12/2020 Name: BRAIDEN RODMAN MRN: [id number] DOB: 1946-03-03  SHIELDS PAUTZ is a 75 y.o. year old male who is a primary care patient of Ria Bush, MD. I reached out to Vincent Peyer by phone today in response to a referral sent by Mr. Theodosia PCP, Ria Bush, MD.   Mr. Koelzer was given information about Chronic Care Management services today including:  1. CCM service includes personalized support from designated clinical staff supervised by his physician, including individualized plan of care and coordination with other care providers 2. 24/7 contact phone numbers for assistance for urgent and routine care needs. 3. Service will only be billed when office clinical staff spend 20 minutes or more in a month to coordinate care. 4. Only one practitioner may furnish and bill the service in a calendar month. 5. The patient may stop CCM services at any time (effective at the end of the month) by phone call to the office staff.   Patient agreed to services and verbal consent obtained.   Follow up plan:   Lauretta Grill Upstream Scheduler

## 2020-11-12 NOTE — Progress Notes (Signed)
  Chronic Care Management   Outreach Note  11/12/2020 Name: JOSUHA FONTANEZ MRN: [id number] DOB: [birth year]  Referred by: Ria Bush, MD Reason for referral : No chief complaint on file.   A second unsuccessful telephone outreach was attempted today. The patient was referred to pharmacist for assistance with care management and care coordination.  Follow Up Plan:   Lauretta Grill Upstream Scheduler

## 2020-11-25 ENCOUNTER — Ambulatory Visit: Payer: Medicare Other | Admitting: Cardiology

## 2020-12-04 ENCOUNTER — Telehealth: Payer: Self-pay | Admitting: Family Medicine

## 2020-12-04 NOTE — Telephone Encounter (Signed)
Patient is going to call back they are driving and were unable to schedule at the time of my call. EM

## 2020-12-04 NOTE — Telephone Encounter (Signed)
Received preop clearance request for upcoming RTC repair - please call to schedule OV.

## 2020-12-04 NOTE — Telephone Encounter (Signed)
Please call and schedule appointment as instructed. 

## 2020-12-30 ENCOUNTER — Telehealth: Payer: Self-pay

## 2020-12-30 NOTE — Chronic Care Management (AMB) (Addendum)
Chronic Care Management Pharmacy Assistant   Name: William Hebert  MRN: [id number] DOB: May 14, 1946  Reason for Encounter:  CCM Initial visit    Conditions to be addressed/monitored: HTN and DMII  Recent office visits:  07/01/2020  Dr.Gutierrez  PCP - Continue Metformin 566m -1 tablet daily with breakfast, labs ordered. Follow up 6 months.   Recent consult visits:  11/26/2020     Dr.Jeffrey Beane    Orthopedics  No medication changes. 07/29/2020   [facility] Tele[facility]-Chest results,  No medication changes  Hospital visits:  None in previous 6 months  Medications: Outpatient Encounter Medications as of 12/30/2020  Medication Sig   Accu-Chek FastClix Lancets MISC Use as instructed to check blood sugar once daily.   albuterol (PROVENTIL HFA;VENTOLIN HFA) 108 (90 Base) MCG/ACT inhaler Inhale 1-2 puffs into the lungs every 4 (four) hours as needed for wheezing or shortness of breath.   Ascorbic Acid (VITAMIN C PO) Take 1,000 mg by mouth daily.   aspirin 81 MG chewable tablet Chew daily by mouth.   b complex vitamins capsule Take 1 capsule by mouth daily.   Blood Glucose Monitoring Suppl (ACCU-CHEK GUIDE) w/Device KIT Use as instructed to check blood sugar once daily.   D3-50 1.25 MG (50000 UT) capsule TAKE 50,000 UNITS (1 CAPSULE) BY MOUTH EVERY FRIDAY. IN THE MORNING.   furosemide (LASIX) 40 MG tablet Take 1 tablet (40 mg total) by mouth 2 (two) times daily.   glucose blood (ACCU-CHEK GUIDE) test strip Use as instructed to check blood sugar once daily.   glucose blood (ACCU-CHEK GUIDE) test strip Use as instructed   ibuprofen (ADVIL) 600 MG tablet Take 1 tablet (600 mg total) by mouth every 8 (eight) hours as needed for moderate pain.   labetalol (NORMODYNE) 200 MG tablet Take 1 tablet (200 mg total) by mouth daily.   metFORMIN (GLUCOPHAGE) 500 MG tablet Take 1 tablet (500 mg total) by mouth daily with breakfast.   methocarbamol (ROBAXIN) 500 MG tablet Take 1-2 tablets  (500-1,000 mg total) by mouth 2 (two) times daily as needed for muscle spasms (sedation precautions).   Multiple Vitamins-Minerals (MULTIVITAMIN ADULT PO) Take 1 tablet by mouth daily. MEN'S 50+ MULTIVITAMIN.   NIFEdipine (PROCARDIA XL/NIFEDICAL XL) 60 MG 24 hr tablet Take 1 tablet (60 mg total) by mouth daily.   omeprazole (PRILOSEC) 40 MG capsule Take 1 capsule (40 mg total) by mouth daily.   potassium chloride SA (KLOR-CON) 20 MEQ tablet Take 1 tablet (20 mEq total) by mouth daily.   rosuvastatin (CRESTOR) 20 MG tablet Take 1 tablet (20 mg total) by mouth daily.   Turmeric 500 MG TABS Take 1 tablet by mouth daily.   valsartan (DIOVAN) 80 MG tablet Take 1 tablet (80 mg total) by mouth daily.   Zinc 100 MG TABS Take 1 tablet (100 mg total) by mouth daily.   Zinc Gluconate 50 MG CAPS Take 1 capsule (50 mg total) by mouth daily.   No facility-administered encounter medications on file as of 12/30/2020.   Lab Results  Component Value Date/Time   HGBA1C 6.4 (A) 07/01/2020 03:26 PM   HGBA1C 12.5 (A) 03/13/2020 04:00 PM   HGBA1C 7.1 (H) 04/03/2019 01:40 PM   HGBA1C 6.9 (H) 03/16/2018 12:51 PM    BP Readings from Last 3 Encounters:  07/01/20 130/82  04/24/20 130/68  03/13/20 132/66   Have you seen any other providers since your last visit with PCP? Yes   07/24/2020  [facility],  telephone CT-Chest results,no medication changes.  11/26/2020  Dr.Beane, orthopedics, left shoulder surgery to be scheduled.  Any changes in your medications or health? No  Any side effects from any medications? No  Do you have an symptoms or problems not managed by your medications? No  Any concerns about your health right now? No  Has your provider asked that you check blood pressure, blood sugar, or follow special diet at home? Yes The patient is reminded to have some recent readings of BG and BP available for CCM telephone visit. Maintains healthy meal options.  Do you get any type of exercise on a  regular basis? No per the patient he will be having left shoulder surgery soon,unable to work out for exercise.  Can you think of a goal you would like to reach for your health? No  Do you have any problems getting your medications? No   The patient is happy with the [employer] plan and the current short term pharmacy has changed.   Patient's preferred pharmacy is:  as of 12/30/2020 New preferred pharmacy for short course medications:       [facility]      [place]      [place] [id number]      [phone]) ph.   [id number] [place]Phone: [phone]: [phone] [address] [place]Phone: [phone]: [phone]  Is there anything that you would like to discuss during the appointment? No. The patient has upcoming left shoulder surgery, this is main concern at this time.   TSHONTA PHILLISwas reminded to have all medications, supplements and any blood glucose and blood pressure readings available for review with MDebbora Dus Pharm. D, at his telephone visit on 12/31/2020 at 2:00pm.  Star Rating Drugs:  Medication:  Last Fill: Day Supply Metformin 5081m1/17/2022 90ds Rosuvstatin 2040m/24/2022 90ds Valsartan 26m98m/29/2021 90ds  Follow-Up:  Pharmacist Review  MichDebbora DusP notified  VelmMcKeeistant [phone] reviewed the care management and care coordination activities outlined in this encounter and I am certifying that I agree with the content of this note. No further action required.  MichDebbora DusarmD Clinical Pharmacist [phone]

## 2020-12-31 ENCOUNTER — Ambulatory Visit (INDEPENDENT_AMBULATORY_CARE_PROVIDER_SITE_OTHER): Payer: Medicare Other

## 2020-12-31 ENCOUNTER — Other Ambulatory Visit: Payer: Self-pay

## 2020-12-31 DIAGNOSIS — E785 Hyperlipidemia, unspecified: Secondary | ICD-10-CM

## 2020-12-31 DIAGNOSIS — M170 Bilateral primary osteoarthritis of knee: Secondary | ICD-10-CM

## 2020-12-31 DIAGNOSIS — I5033 Acute on chronic diastolic (congestive) heart failure: Secondary | ICD-10-CM

## 2020-12-31 DIAGNOSIS — J449 Chronic obstructive pulmonary disease, unspecified: Secondary | ICD-10-CM

## 2020-12-31 DIAGNOSIS — IMO0002 Reserved for concepts with insufficient information to code with codable children: Secondary | ICD-10-CM

## 2020-12-31 DIAGNOSIS — E118 Type 2 diabetes mellitus with unspecified complications: Secondary | ICD-10-CM

## 2020-12-31 DIAGNOSIS — E1169 Type 2 diabetes mellitus with other specified complication: Secondary | ICD-10-CM

## 2020-12-31 DIAGNOSIS — I10 Essential (primary) hypertension: Secondary | ICD-10-CM | POA: Diagnosis not present

## 2020-12-31 DIAGNOSIS — E1165 Type 2 diabetes mellitus with hyperglycemia: Secondary | ICD-10-CM

## 2020-12-31 NOTE — Progress Notes (Signed)
Chronic Care Management Pharmacy Note  12/31/20 Name:  AUL MANGIERI MRN:  [id number] DOB:  [birth year]  Subjective: William Hebert is an 75 y.o. year old male who is a primary patient of Ria Bush, MD.  The CCM team was consulted for assistance with disease management and care coordination needs.    Engaged with patient by telephone for initial visit in response to provider referral for pharmacy case management and/or care coordination services.    Consent to Services:  The patient was given the following information about Chronic Care Management services today, agreed to services, and gave verbal consent: 1. CCM service includes personalized support from designated clinical staff supervised by the primary care provider, including individualized plan of care and coordination with other care providers 2. 24/7 contact phone numbers for assistance for urgent and routine care needs. 3. Service will only be billed when office clinical staff spend 20 minutes or more in a month to coordinate care. 4. Only one practitioner may furnish and bill the service in a calendar month. 5.The patient may stop CCM services at any time (effective at the end of the month) by phone call to the office staff. 6. The patient will be responsible for cost sharing (co-pay) of up to 20% of the service fee (after annual deductible is met). Patient agreed to services and consent obtained.  Patient Care Team: Ria Bush, MD as PCP - General (Family Medicine) Adrian Prows, MD (Cardiology) Debbora Dus, [facility] as Pharmacist (Pharmacist)  Recent office visits: 07/01/2020  Dr.Gutierrez, PCP - Continue Metformin 500mg  -1 tablet daily with breakfast, labs ordered. Referred back to lung cancer screening program. Last CT done 2019. Follow up 6 months.   Recent consult visits:  11/26/2020 Dr.Jeffrey Beane, Orthopedics - No medication changes. 07/29/2020 [place], Cardiology Telephone Note - CT-Chest results, No medication  changes  Hospital visits: None in previous 6 months  Objective:  Lab Results  Component Value Date   CREATININE 1.27 07/24/2020   BUN 15 07/24/2020   GFR 56.33 (L) 07/24/2020   GFRNONAA 58 (L) 07/06/2017   GFRAA >60 07/06/2017   NA 140 07/24/2020   K 3.5 07/24/2020   CALCIUM 9.1 07/24/2020   CO2 29 07/24/2020   GLUCOSE 112 (H) 07/24/2020    Lab Results  Component Value Date/Time   HGBA1C 6.4 (A) 07/01/2020 03:26 PM   HGBA1C 12.5 (A) 03/13/2020 04:00 PM   HGBA1C 7.1 (H) 04/03/2019 01:40 PM   HGBA1C 6.9 (H) 03/16/2018 12:51 PM   FRUCTOSAMINE 211 04/24/2020 03:50 PM   GFR 56.33 (L) 07/24/2020 09:45 AM   GFR 52.32 (L) 04/24/2020 03:50 PM    Last diabetic Eye exam:  Lab Results  Component Value Date/Time   HMDIABEYEEXA No Retinopathy 06/10/2020 12:00 AM    Last diabetic Foot exam: 03/13/20 per PCP   Lab Results  Component Value Date   CHOL 115 07/24/2020   HDL 36.20 (L) 07/24/2020   LDLCALC 48 07/24/2020   LDLDIRECT 81.0 04/03/2019   TRIG 154.0 (H) 07/24/2020   CHOLHDL 3 07/24/2020    Hepatic Function Latest Ref Rng & Units 07/24/2020 03/13/2020 04/03/2019  Total Protein 6.0 - 8.3 g/dL 5.9(L) 6.2 5.9(L)  Albumin 3.5 - 5.2 g/dL 3.8 3.8 3.8  AST 0 - 37 U/L 15 20 16   ALT 0 - 53 U/L 18 21 19   Alk Phosphatase 39 - 117 U/L 65 97 74  Total Bilirubin 0.2 - 1.2 mg/dL 0.4 0.3 0.3    Lab Results  Component  Value Date/Time   TSH 3.01 03/16/2018 12:51 PM   TSH 1.750 03/06/2014 04:39 PM   FREET4 1.04 03/06/2014 04:39 PM    CBC Latest Ref Rng & Units 02/25/2018 06/14/2017 11/25/2016  WBC 3.8 - 10.8 Thousand/uL 8.1 7.6 8.2  Hemoglobin 13.2 - 17.1 g/dL 14.6 14.1 14.4  Hematocrit 38.5 - 50.0 % 43.1 41.8 42.8  Platelets 140 - 400 Thousand/uL 200 198.0 208.0    Lab Results  Component Value Date/Time   VD25OH 35.82 07/24/2020 09:45 AM   VD25OH 41.02 04/03/2019 01:40 PM    Clinical ASCVD: No  The ASCVD Risk score Mikey Bussing DC Jr., et al., 2013) failed to calculate for the  following reasons:   The valid total cholesterol range is 130 to 320 mg/dL     Depression screen [facility] 2/9 06/26/2020 04/03/2019 03/16/2018  Decreased Interest 0 0 0  Down, Depressed, Hopeless 0 0 0  PHQ - 2 Score 0 0 0  Altered sleeping 0 0 0  Tired, decreased energy 0 0 0  Change in appetite 0 0 0  Feeling bad or failure about yourself  0 0 0  Trouble concentrating 0 0 0  Moving slowly or fidgety/restless 0 0 0  Suicidal thoughts 0 0 0  PHQ-9 Score 0 0 0  Difficult doing work/chores Not difficult at all - Not difficult at all  Some recent data might be hidden    Social History   Tobacco Use  Smoking Status Former Smoker  . Packs/day: 1.50  . Years: 51.00  . Pack years: 76.50  . Types: Cigarettes  . Quit date: 2013  . Years since quitting: 9.3  Smokeless Tobacco Former Systems developer  . Types: Snuff, Chew  . Quit date: 07/06/2006   BP Readings from Last 3 Encounters:  07/01/20 130/82  04/24/20 130/68  03/13/20 132/66   Pulse Readings from Last 3 Encounters:  07/01/20 67  04/24/20 72  03/13/20 (!) 58   Wt Readings from Last 3 Encounters:  07/24/20 (!) 312 lb (141.5 kg)  07/01/20 (!) 312 lb 8 oz (141.7 kg)  04/24/20 (!) 316 lb 2 oz (143.4 kg)   BMI Readings from Last 3 Encounters:  07/24/20 44.77 kg/m  07/01/20 44.21 kg/m  04/24/20 44.09 kg/m    Assessment/Interventions: Review of patient past medical history, allergies, medications, health status, including review of consultants reports, laboratory and other test data, was performed as part of comprehensive evaluation and provision of chronic care management services.   SDOH:  (Social Determinants of Health) assessments and interventions performed: Yes SDOH Interventions   Flowsheet Row Most Recent Value  SDOH Interventions   Financial Strain Interventions Intervention Not Indicated  [Medications affordable]     SDOH Screenings   Alcohol Screen: Low Risk   . Last Alcohol Screening Score (AUDIT): 1  Depression  (PHQ2-9): Low Risk   . PHQ-2 Score: 0  Financial Resource Strain: Low Risk   . Difficulty of Paying Living Expenses: Not hard at all  Food Insecurity: No Food Insecurity  . Worried About Charity fundraiser in the Last Year: Never true  . Ran Out of Food in the Last Year: Never true  Housing: Low Risk   . Last Housing Risk Score: 0  Physical Activity: Inactive  . Days of Exercise per Week: 0 days  . Minutes of Exercise per Session: 0 min  Social Connections: Not on file  Stress: No Stress Concern Present  . Feeling of Stress : Not at all  Tobacco Use:  Medium Risk  . Smoking Tobacco Use: Former Smoker  . Smokeless Tobacco Use: Former Soil scientist Needs: No Transportation Needs  . Lack of Transportation (Medical): No  . Lack of Transportation (Non-Medical): No    CCM Care Plan  No Known Allergies  Medications Reviewed Today    Reviewed by Debbora Dus, [facility] (Pharmacist) on 12/31/20 at 1512  Med List Status: <None>  Medication Order Taking? Sig Documenting Provider Last Dose Status Informant  Accu-Chek FastClix Lancets MISC [id number] Yes Use as instructed to check blood sugar once daily. Ria Bush, MD Taking Active   albuterol (PROVENTIL HFA;VENTOLIN HFA) 108 [phone] Base) MCG/ACT inhaler [id number] No Inhale 1-2 puffs into the lungs every 4 (four) hours as needed for wheezing or shortness of breath.  Patient not taking: Reported on 12/31/2020   [provider] Not Taking Active   Ascorbic Acid (VITAMIN C PO) [id number] Yes Take 1,000 mg by mouth daily. [provider] Taking Active   aspirin 81 MG chewable tablet [id number] Yes Chew daily by mouth. [provider] Taking Active   Blood Glucose Monitoring Suppl (ACCU-CHEK GUIDE) w/Device KIT [id number] Yes Use as instructed to check blood sugar once daily. Ria Bush, MD Taking Active   D3-50 1.25 MG (50000 UT) capsule [id number] Yes TAKE 50,000 UNITS (1 CAPSULE) BY MOUTH EVERY FRIDAY. IN THE  MORNING. Ria Bush, MD Taking Active   furosemide (LASIX) 40 MG tablet [id number] Yes Take 1 tablet (40 mg total) by mouth 2 (two) times daily.  Patient taking differently: Take 40 mg by mouth 2 (two) times daily. 1 tablet daily   Ria Bush, MD Taking Active Self  glucose blood (ACCU-CHEK GUIDE) test strip [id number] Yes Use as instructed to check blood sugar once daily. Ria Bush, MD Taking Active   glucose blood (ACCU-CHEK GUIDE) test strip [id number] Yes Use as instructed Ria Bush, MD Taking Active   ibuprofen (ADVIL) 600 MG tablet [id number] Yes Take 1 tablet (600 mg total) by mouth every 8 (eight) hours as needed for moderate pain.  Patient taking differently: Take 800 mg by mouth every 8 (eight) hours as needed for moderate pain.   Ria Bush, MD Taking Active Self  labetalol (NORMODYNE) 200 MG tablet [id number] Yes Take 1 tablet (200 mg total) by mouth daily. Ria Bush, MD Taking Active   metFORMIN (GLUCOPHAGE) 500 MG tablet [id number] Yes Take 1 tablet (500 mg total) by mouth daily with breakfast. Ria Bush, MD Taking Active   methocarbamol (ROBAXIN) 500 MG tablet [id number] Yes Take 1-2 tablets (500-1,000 mg total) by mouth 2 (two) times daily as needed for muscle spasms (sedation precautions). Ria Bush, MD Taking Active   Multiple Vitamins-Minerals (MULTIVITAMIN ADULT PO) [id number] Yes Take 1 tablet by mouth daily. MEN'S 50+ MULTIVITAMIN. [provider] Taking Active Self  NIFEdipine (PROCARDIA XL/NIFEDICAL XL) 60 MG 24 hr tablet [id number] Yes Take 1 tablet (60 mg total) by mouth daily. Ria Bush, MD Taking Active   omeprazole (PRILOSEC) 40 MG capsule [id number] Yes Take 1 capsule (40 mg total) by mouth daily. Ria Bush, MD Taking Active   potassium chloride SA (KLOR-CON) 20 MEQ tablet [id number] Yes Take 1 tablet (20 mEq total) by mouth daily. Ria Bush, MD Taking Active   rosuvastatin (CRESTOR) 20 MG  tablet [id number] Yes Take 1 tablet (20 mg total) by mouth daily. Adrian Prows, MD Taking Active   valsartan (DIOVAN) 80 MG tablet [id number] Yes Take 1 tablet (80 mg total) by mouth daily. Adrian Prows, MD Taking  Active   Zinc 100 MG TABS [id number] Yes Take 1 tablet (100 mg total) by mouth daily. Ria Bush, MD Taking Active   Zinc Gluconate 50 MG CAPS [id number]  Take 1 capsule (50 mg total) by mouth daily. Ria Bush, MD  Consider Medication Status and Discontinue (Change in therapy)           Patient Active Problem List   Diagnosis Date Noted  . Left shoulder pain 07/03/2020  . Nasal congestion 07/02/2020  . Medicare annual wellness visit, subsequent 04/10/2019  . Lump in the abdomen 01/10/2019  . COPD (chronic obstructive pulmonary disease) ([place]) 05/12/2018  . Acute on chronic diastolic CHF (congestive heart failure) ([place]) 05/07/2018  . Pedal edema 07/21/2017  . Incomplete rotator cuff tear or rupture of right shoulder, not specified as traumatic 07/08/2017  . Complete rotator cuff tear 07/08/2017  . Pre-op evaluation 06/14/2017  . Vitamin D deficiency 06/14/2017  . Uncontrolled diabetes mellitus with complication, without long-term current use of insulin ([place]) 06/14/2017  . Health maintenance examination 12/22/2016  . Advanced care planning/counseling discussion 12/22/2016  . Chronic cough 11/04/2016  . Lipoma of skin and subcutaneous tissue of neck 04/14/2016  . AAA (abdominal aortic aneurysm) without rupture ([place]) 07/10/2014  . Morbid obesity with BMI of 40.0-44.9, adult ([place]) 03/27/2014  . Osteoarthritis of both knees 03/27/2014  . Hypogonadism in male 03/06/2014  . S/P AAA (abdominal aortic aneurysm) repair 07/04/2013  . Dyslipidemia associated with type 2 diabetes mellitus ([place]) 06/16/2012  . Hypertension 06/16/2012  . Erectile dysfunction 06/16/2012  . Ex-smoker 04/13/2011  . Obstructive sleep apnea 04/13/2011    Immunization History  Administered Date(s)  Administered  . PFIZER(Purple Top)SARS-COV-2 Vaccination 06/13/2020, 07/04/2020  . Pneumococcal Conjugate-13 03/06/2014  . Pneumococcal Polysaccharide-23 09/10/2015  . Td 03/06/2014    Conditions to be addressed/monitored:  Hypertension, Hyperlipidemia, Diabetes, Heart Failure, COPD and Osteoarthritis  Care Plan : [place]  Updates made by Debbora Dus, [place] since 01/01/2021 12:00 AM    Problem: CHL AMB "PATIENT-SPECIFIC PROBLEM"     Long-Range Goal: Disease Management   Start Date: 01/01/2021  Priority: High  Note:   Current Barriers:  . Unable to find an affordable blood pressure monitor that fits  Pharmacist Clinical Goal(s):  [name] Patient will contact provider office for questions/concerns as evidenced notation of same in electronic health record through collaboration with PharmD and provider.   Interventions: . 1:1 collaboration with Ria Bush, MD regarding development and update of comprehensive plan of care as evidenced by provider attestation and co-signature . Inter-disciplinary care team collaboration (see longitudinal plan of care) . Comprehensive medication review performed; medication list updated in electronic medical record  Hypertension (BP goal <140/90) -Controlled - per clinic readings  -Current treatment: . Labetalol 200 mg - 1 daily in the morning  . Nifedipine 60 mg 24 hour - 1 daily in the morning . Valsartan 80 mg - 1 daily in the evening -Medications previously tried: HCTZ - Frequent urination -Wears CPAP every night  -Cannot find a BP cuff large cuff for arm to check BP, difficulty affording as well -More sedentary since he retired in March  -Current home readings: n/a -Current dietary habits: low carb, low cholesterol - wife cooks meals and watches what he eats  -Current exercise habits: none  -Denies hypotensive/hypertensive symptoms  -Wife is concerned he is on too much BP medications. She would like to start home monitoring to  see if he can come off some. Will review insurance  coverage for patient. If not covered, recommend ordering XL cuff online.   -Educated on BP goals and benefits of medications for prevention of heart attack, stroke and kidney damage; -Recommended to continue current medication; Check into insurance coverage for BP monitor.  Hyperlipidemia: (LDL goal < 100) -Controlled - LDL 41 -Current treatment: . Rosuvastatin 20 mg - 1 tablet daily . Aspirin 81 mg - 1 tablet daily  -Medications previously tried: none -Current dietary patterns: reports low cholesterol diet -Current exercise habits: minimal -Educated on Cholesterol goals;  -Recommended to continue current medication  Diabetes (A1c goal <7%) -Controlled - A1c 6.4% -Diagnosed Summer 2021, gave up sweet tea and bread. Ongoing weight loss.  -Current medications: [name] Metformin 500 mg - 1 tablet daily before breakfast -Medications previously tried: glipizide  -We discussed okay to take 1 extra metformin 500 mg before supper if eating out or high carb meal (max 2000 mg/day) -Current home glucose readings . Various times: 5/3 - 153 (After lunch), 3/3-110 (evening),  2/17 - 126, 2/11 - 103 . post prandial glucose: reports BG went up to 400 one time after big meal  -Denies hypoglycemic/Reports occasional hyperglycemic symptoms -Current meal patterns:  . Pasta once a week, overall tries to eat low carb when eating at home, wife cooks meals  -Current exercise: minimal  -Educated on A1c and blood sugar goals; Benefits of routine self-monitoring of blood sugar; -Counseled to check feet daily and get yearly eye exams - up to date  -Recommended to continue current medication; May increase metformin 500 mg by an additional tablet prior to evening meal if eating out or high carbohydrate.   Heart Failure (Goal: manage symptoms and prevent exacerbations) -Controlled - followed by cardiology -Last ejection fraction: 60-65% (normal) (Date: 02/2018) -HF  type: Diastolic -NYHA Class: II (slight limitation of activity)  -History of sleeping in chair, cough thought to be related to CHF.  -AHA HF Stage: B (Heart disease present - no symptoms present) -Current treatment: . Furosemide 40 mg - 1 tablet twice daily (patient taking differently: 1 tablet daily except when traveling - takes 1/2 tablet daily) . Potassium chloride 20 mEq - 1 daily   -Medications previously tried: none -Current home BP/HR readings: none  -Current dietary habits: limits sodium -Patient denies shortness of breath. Reports leg swelling is stable but not improved on furosemide. Difficulty tolerating furosemide due to frequent urination. Reports very frequent even on 40 mg once daily. -Educated on Importance of weighing daily; if you gain more than 3 pounds in one day or 5 pounds in one week, call your doctor -Recommended to continue current medication; Restrict salt and fluid intake. If unable to tolerate frequent urination with Lasix, discuss with PCP at follow up visit.  COPD (Goal: control symptoms and prevent exacerbations) -Controlled - per patient report -Current treatment  . Albuterol PRN -Medications previously tried: multiple allergy medications - reports none effective -Gold Grade: Gold 1 (FEV1>80%) -Current COPD Classification:  A (low sx, <2 exacerbations/yr) -Pulmonary function testing: June 2019 - FEV1 82% -Exacerbations requiring treatment in last 6 months: none -Frequency of rescue inhaler use: very rare -Counseled on When to use rescue inhaler -Recommended to continue current medication  Osteoarthritis -Shoulder is primary pain right now  -Followed by orthopedics, surgery repair scheduled  -Controlled - per patient report -Current treatment  . Ibuprofen 800 mg - Takes 1 ibuprofen 800 mg every morning . Tylenol 500 mg  - Takes 2 tablets (1000 mg) every morning  . Methocarbamol 500 mg -  1-2 tablets twice daily as needed (takes 1 tablet at bedtime if  needed) -Medications previously tried:  Tumeric - no longer taking  -Pt reports Advil and Tylenol were recommended at current dose per orthopedics. We discussed risks of routine NSAID use including kidney impairment and bleeding risk with his daily aspirin. Recommend using only Advil as needed if Tylenol ineffective.  -Recommended to continue current medication  Other Vitamin D3 50,000 IU weekly Friday Omeprazole 40 mg -  1 tablet every other daily, reports every other day dosing is working well   OTCs Vitamin C High dose Time Release - daily Multivitamin 50+ - daily Zinc 100 mg-  daily  Patient Goals/Self-Care Activities . Patient will:  - focus on medication adherence by continuing to use a pillbox     Medication Assistance: None required.  Patient affirms current coverage meets needs.  Patient's preferred pharmacy is: [facility], [address], [address] [place], [address] [place] [address] Phone: [phone] Fax: [phone]  Maintenance meds - mail order Acute meds -  [facility]  Uses pill box? Yes - Darlene fills medications from information on pill bottles Pt endorses 100% compliance  We discussed: Current pharmacy is preferred with insurance plan and patient is satisfied with pharmacy services Patient decided to: Continue current medication management strategy  Care Plan and Follow Up Patient Decision:  Patient agrees to Care Plan and Follow-up.  Plan: Telephone follow up appointment with care management team member scheduled for:  6-8 weeks (February 24, 2021 at 10 AM)  Debbora Dus, PharmD Clinical Pharmacist [facility] [phone]

## 2021-01-01 ENCOUNTER — Telehealth: Payer: Self-pay

## 2021-01-01 NOTE — Patient Instructions (Signed)
Jan 01, 2021  Dear William Hebert,  It was a pleasure meeting you during our initial appointment on Dec 31, 2020. Below is a summary of the goals we discussed and components of chronic care management. Please contact me anytime with questions or concerns.   Visit Information  Patient Care Plan: CCM Pharmacy Care Plan    Problem Identified: CHL AMB "PATIENT-SPECIFIC PROBLEM"     Long-Range Goal: Disease Management   Start Date: 01/01/2021  Priority: High  Note:   Current Barriers:  . Unable to find an affordable blood pressure monitor that fits  Pharmacist Clinical Goal(s):  [name] Patient will contact provider office for questions/concerns as evidenced notation of same in electronic health record through collaboration with PharmD and provider.   Interventions: . 1:1 collaboration with Ria Bush, MD regarding development and update of comprehensive plan of care as evidenced by provider attestation and co-signature . Inter-disciplinary care team collaboration (see longitudinal plan of care) . Comprehensive medication review performed; medication list updated in electronic medical record  Hypertension (BP goal <140/90) -Controlled - per clinic readings  -Current treatment: . Labetalol 200 mg - 1 daily in the morning  . Nifedipine 60 mg 24 hour - 1 daily in the morning . Valsartan 80 mg - 1 daily in the evening -Medications previously tried: HCTZ - Frequent urination -Wears CPAP every night  -Cannot find a BP cuff large cuff for arm to check BP, difficulty affording as well -More sedentary since he retired in March  -Current home readings: n/a -Current dietary habits: low carb, low cholesterol - wife cooks meals and watches what he eats  -Current exercise habits: none  -Denies hypotensive/hypertensive symptoms  -Wife is concerned he is on too much BP medications. She would like to start home monitoring to see if he can come off some. Will review insurance coverage for patient. If  not covered, recommend ordering XL cuff online.   -Educated on BP goals and benefits of medications for prevention of heart attack, stroke and kidney damage; -Recommended to continue current medication; Check into insurance coverage for BP monitor.  Hyperlipidemia: (LDL goal < 100) -Controlled - LDL 41 -Current treatment: . Rosuvastatin 20 mg - 1 tablet daily . Aspirin 81 mg - 1 tablet daily  -Medications previously tried: none -Current dietary patterns: reports low cholesterol diet -Current exercise habits: minimal -Educated on Cholesterol goals;  -Recommended to continue current medication  Diabetes (A1c goal <7%) -Controlled - A1c 6.4% -Diagnosed Summer 2021, gave up sweet tea and bread. Ongoing weight loss.  -Current medications: [name] Metformin 500 mg - 1 tablet daily before breakfast -Medications previously tried: glipizide  -We discussed okay to take 1 extra metformin 500 mg before supper if eating out or high carb meal (max 2000 mg/day) -Current home glucose readings . Various times: 5/3 - 153 (After lunch), 3/3-110 (evening),  2/17 - 126, 2/11 - 103 . post prandial glucose: reports BG went up to 400 one time after big meal  -Denies hypoglycemic/Reports occasional hyperglycemic symptoms -Current meal patterns:  . Pasta once a week, overall tries to eat low carb when eating at home, wife cooks meals  -Current exercise: minimal  -Educated on A1c and blood sugar goals; Benefits of routine self-monitoring of blood sugar; -Counseled to check feet daily and get yearly eye exams - up to date  -Recommended to continue current medication; May increase metformin 500 mg by an additional tablet prior to evening meal if eating out or high carbohydrate.   Heart  Failure (Goal: manage symptoms and prevent exacerbations) -Controlled - followed by cardiology -Last ejection fraction: 60-65% (normal) (Date: 02/2018) -HF type: Diastolic -NYHA Class: II (slight limitation of activity)  -History  of sleeping in chair, cough thought to be related to CHF.  -AHA HF Stage: B (Heart disease present - no symptoms present) -Current treatment: . Furosemide 40 mg - 1 tablet twice daily (patient taking differently: 1 tablet daily except when traveling - takes 1/2 tablet daily) . Potassium chloride 20 mEq - 1 daily   -Medications previously tried: none -Current home BP/HR readings: none  -Current dietary habits: limits sodium -Patient denies shortness of breath. Reports leg swelling is stable but not improved on furosemide. Difficulty tolerating furosemide due to frequent urination. Reports very frequent even on 40 mg once daily. -Educated on Importance of weighing daily; if you gain more than 3 pounds in one day or 5 pounds in one week, call your doctor -Recommended to continue current medication; Restrict salt and fluid intake. If unable to tolerate frequent urination with Lasix, discuss with PCP at follow up visit.  COPD (Goal: control symptoms and prevent exacerbations) -Controlled - per patient report -Current treatment  . Albuterol PRN -Medications previously tried: multiple allergy medications - reports none effective -Gold Grade: Gold 1 (FEV1>80%) -Current COPD Classification:  A (low sx, <2 exacerbations/yr) -Pulmonary function testing: June 2019 - FEV1 82% -Exacerbations requiring treatment in last 6 months: none -Frequency of rescue inhaler use: very rare -Counseled on When to use rescue inhaler -Recommended to continue current medication  Osteoarthritis -Shoulder is primary pain right now  -Followed by orthopedics, surgery repair scheduled  -Controlled - per patient report -Current treatment  . Ibuprofen 800 mg - Takes 1 ibuprofen 800 mg every morning . Tylenol 500 mg  - Takes 2 tablets (1000 mg) every morning  . Methocarbamol 500 mg - 1-2 tablets twice daily as needed (takes 1 tablet at bedtime if needed) -Medications previously tried:  Tumeric - no longer taking  -Pt  reports Advil and Tylenol were recommended at current dose per orthopedics. We discussed risks of routine NSAID use including kidney impairment and bleeding risk with his daily aspirin. Recommend using only Advil as needed if Tylenol ineffective.  -Recommended to continue current medication  Other Vitamin D3 50,000 IU weekly Friday Omeprazole 40 mg -  1 tablet every other daily, reports every other day dosing is working well   OTCs Vitamin C High dose Time Release - daily Multivitamin 50+ - daily Zinc 100 mg-  daily  Patient Goals/Self-Care Activities . Patient will:  - focus on medication adherence by continuing to use a pillbox     Mr. Fermin was given information about Chronic Care Management services today including:  1. CCM service includes personalized support from designated clinical staff supervised by his physician, including individualized plan of care and coordination with other care providers 2. 24/7 contact phone numbers for assistance for urgent and routine care needs. 3. Standard insurance, coinsurance, copays and deductibles apply for chronic care management only during months in which we provide at least 20 minutes of these services. Most insurances cover these services at 100%, however patients may be responsible for any copay, coinsurance and/or deductible if applicable. This service may help you avoid the need for more expensive face-to-face services. 4. Only one practitioner may furnish and bill the service in a calendar month. 5. The patient may stop CCM services at any time (effective at the end of the month) by phone call  to the office staff.  Patient agreed to services and verbal consent obtained.   The patient verbalized understanding of instructions, educational materials, and care plan provided today and agreed to receive a mailed copy of patient instructions, educational materials, and care plan.   Plan: Telephone follow up appointment with care management  team member scheduled for:  6-8 weeks (February 24, 2021 at 10 AM)  Debbora Dus, PharmD Clinical Pharmacist [facility] at [facility] [phone]'

## 2021-01-01 NOTE — Telephone Encounter (Signed)
Lattie Haw, do you know where the patient could get an XL blood pressure cuff covered by insurance? Is there a medical supply store we send prescriptions to? I have not had success sending prescriptions to pharmacies.   Debbora Dus, PharmD Clinical Pharmacist [facility] at [facility] [phone]

## 2021-01-02 NOTE — Progress Notes (Signed)
Encounter details: CCM Time Spent       Value Time User   Time spent with patient (minutes)  130 01/01/2021  4:20 PM Debbora Dus, [facility]   Time spent performing Chart review  30 01/01/2021  4:20 PM Debbora Dus, [facility]   Total time (minutes)  160 01/01/2021  4:20 PM Debbora Dus, RPH      Moderate to High Complex Decision Making       Value Time User   Moderate to High complex decision making  Yes 01/01/2021  4:20 PM Debbora Dus, [facility]      CCM Services: This encounter meets complex CCM services and moderate to high decision making.  Prior to outreach and patient consent for Chronic Care Management, I referred this patient for services after reviewing the nominated patient list or from a personal encounter with the patient.  I have personally reviewed this encounter including the documentation in this note and have collaborated with the care management provider regarding care management and care coordination activities to include development and update of the comprehensive care plan. I am certifying that I agree with the content of this note and encounter as supervising physician.

## 2021-01-03 NOTE — Telephone Encounter (Signed)
Sorry, I'm just seeing this.  But we don't have any specific location we use.  Usually for something like that, Dr. Darnell Level will provide the pt with at written rx to take a location of their choice.

## 2021-01-07 NOTE — Telephone Encounter (Addendum)
Patient has PCP visit tomorrow 01/08/21. He would like a prescription for an XL cuff home BP monitor. Could this be provided during visit?   Thanks,  Debbora Dus, PharmD Clinical Pharmacist [facility] at [facility] [phone]

## 2021-01-07 NOTE — Telephone Encounter (Signed)
Rx written and will be provided for patient tomorrow.

## 2021-01-07 NOTE — Telephone Encounter (Signed)
Noted  

## 2021-01-08 ENCOUNTER — Ambulatory Visit (INDEPENDENT_AMBULATORY_CARE_PROVIDER_SITE_OTHER)
Admission: RE | Admit: 2021-01-08 | Discharge: 2021-01-08 | Disposition: A | Discharge status: Home / Self Care | Payer: Medicare Other | Source: Ambulatory Visit | Attending: Family Medicine | Admitting: Family Medicine

## 2021-01-08 ENCOUNTER — Encounter: Payer: Self-pay | Admitting: Family Medicine

## 2021-01-08 ENCOUNTER — Other Ambulatory Visit: Payer: Self-pay

## 2021-01-08 ENCOUNTER — Ambulatory Visit (INDEPENDENT_AMBULATORY_CARE_PROVIDER_SITE_OTHER): Payer: Medicare Other | Admitting: Family Medicine

## 2021-01-08 VITALS — BP 138/72 | HR 66 | Temp 98.1°F | Ht 70.0 in | Wt 325.6 lb

## 2021-01-08 DIAGNOSIS — J449 Chronic obstructive pulmonary disease, unspecified: Secondary | ICD-10-CM

## 2021-01-08 DIAGNOSIS — G4733 Obstructive sleep apnea (adult) (pediatric): Secondary | ICD-10-CM

## 2021-01-08 DIAGNOSIS — Z01818 Encounter for other preprocedural examination: Secondary | ICD-10-CM

## 2021-01-08 DIAGNOSIS — I714 Abdominal aortic aneurysm, without rupture, unspecified: Secondary | ICD-10-CM

## 2021-01-08 DIAGNOSIS — I5032 Chronic diastolic (congestive) heart failure: Secondary | ICD-10-CM

## 2021-01-08 DIAGNOSIS — Z8679 Personal history of other diseases of the circulatory system: Secondary | ICD-10-CM

## 2021-01-08 DIAGNOSIS — I10 Essential (primary) hypertension: Secondary | ICD-10-CM

## 2021-01-08 DIAGNOSIS — M75102 Unspecified rotator cuff tear or rupture of left shoulder, not specified as traumatic: Secondary | ICD-10-CM

## 2021-01-08 DIAGNOSIS — Z6841 Body Mass Index (BMI) 40.0 and over, adult: Secondary | ICD-10-CM

## 2021-01-08 DIAGNOSIS — E118 Type 2 diabetes mellitus with unspecified complications: Secondary | ICD-10-CM

## 2021-01-08 DIAGNOSIS — E1169 Type 2 diabetes mellitus with other specified complication: Secondary | ICD-10-CM

## 2021-01-08 DIAGNOSIS — Z9889 Other specified postprocedural states: Secondary | ICD-10-CM

## 2021-01-08 DIAGNOSIS — E785 Hyperlipidemia, unspecified: Secondary | ICD-10-CM

## 2021-01-08 DIAGNOSIS — I5033 Acute on chronic diastolic (congestive) heart failure: Secondary | ICD-10-CM

## 2021-01-08 LAB — POC URINALSYSI DIPSTICK (AUTOMATED)
Blood, UA: NEGATIVE
Glucose, UA: NEGATIVE
Leukocytes, UA: NEGATIVE
Nitrite, UA: NEGATIVE
Protein, UA: POSITIVE — AB
Spec Grav, UA: >=1.03 — AB (ref 1.010–1.025)
Urobilinogen, UA: 0.2 E.U./dL
pH, UA: 6 (ref 5.0–8.0)

## 2021-01-08 NOTE — Progress Notes (Signed)
Patient ID: William Hebert, male    DOB: [birth year], 75 y.o.   MRN: [id number]  This visit was conducted in person.  BP 138/72   Pulse 66   Temp 98.1 F (36.7 C) (Temporal)   Ht 5\' 10"  (1.778 m)   Wt (!) 325 lb 9 oz (147.7 kg)   SpO2 95%   BMI 46.71 kg/m    CC: preop eval  Subjective:   HPI: William Hebert is a 75 y.o. male presenting on 01/08/2021 for Pre-op Exam (Left rotator cuff surgery pending. )   Pending left RTC repair under GETA by Dr 03/10/2021. [place]. Activity limited by left shoulder pain. Weight gain noted.   Known well controlled diabetic, OSA on CPAP, HTN, AAA s/p endovascular repair 2018 Not checking sugars regularly - about once a month - latest fasting check 157. Not needing albuterol.   Has previously tolerated GETA latest R RTC repair 2018 without postop nausea/vomiting or difficulty awakening from surgery.   Denies current fevers/chills, chest pain, tightness, dyspnea, leg swelling, dizziness, HA, palpitations.      Relevant past medical, surgical, family and social history reviewed and updated as indicated. Interim medical history since our last visit reviewed. Allergies and medications reviewed and updated. Outpatient Medications Prior to Visit  Medication Sig Dispense Refill  . Accu-Chek FastClix Lancets MISC Use as instructed to check blood sugar once daily. 100 each 0  . albuterol (PROVENTIL HFA;VENTOLIN HFA) 108 (90 Base) MCG/ACT inhaler Inhale 1-2 puffs into the lungs every 4 (four) hours as needed for wheezing or shortness of breath.    . Ascorbic Acid (VITAMIN C PO) Take 1,000 mg by mouth daily.    2019 aspirin 81 MG chewable tablet Chew daily by mouth.    . Blood Glucose Monitoring Suppl (ACCU-CHEK GUIDE) w/Device KIT Use as instructed to check blood sugar once daily. 1 kit 0  . D3-50 1.25 MG (50000 UT) capsule TAKE 50,000 UNITS (1 CAPSULE) BY MOUTH EVERY FRIDAY. IN THE  MORNING. 12 capsule 2  . glucose blood (ACCU-CHEK GUIDE) test strip Use as instructed to check blood sugar once daily. 100 each 1  . glucose blood (ACCU-CHEK GUIDE) test strip Use as instructed 100 each 12  . ibuprofen (ADVIL) 600 MG tablet Take 1 tablet (600 mg total) by mouth every 8 (eight) hours as needed for moderate pain. (Patient taking differently: Take 800 mg by mouth every 8 (eight) hours as needed for moderate pain.) 30 tablet 0  . labetalol (NORMODYNE) 200 MG tablet Take 1 tablet (200 mg total) by mouth daily. 90 tablet 3  . metFORMIN (GLUCOPHAGE) 500 MG tablet Take 1 tablet (500 mg total) by mouth daily with breakfast. 90 tablet 3  . methocarbamol (ROBAXIN) 500 MG tablet Take 1-2 tablets (500-1,000 mg total) by mouth 2 (two) times daily as needed for muscle spasms (sedation precautions). 40 tablet 0  . Multiple Vitamins-Minerals (MULTIVITAMIN ADULT PO) Take 1 tablet by mouth daily. MEN'S 50+ MULTIVITAMIN.    [name] NIFEdipine (PROCARDIA XL/NIFEDICAL XL) 60 MG 24 hr tablet Take 1 tablet (60 mg total) by mouth daily. 90 tablet 3  . omeprazole (PRILOSEC) 40 MG capsule Take 1 capsule (40 mg total) by mouth daily. 90 capsule 3  . potassium chloride SA (KLOR-CON) 20 MEQ tablet Take 1 tablet (20 mEq total) by mouth daily. 90 tablet 3  . rosuvastatin (CRESTOR) 20 MG tablet Take 1  tablet (20 mg total) by mouth daily. 90 tablet 2  . valsartan (DIOVAN) 80 MG tablet Take 1 tablet (80 mg total) by mouth daily. 90 tablet 2  . Zinc 100 MG TABS Take 1 tablet (100 mg total) by mouth daily. 90 tablet 3  . furosemide (LASIX) 40 MG tablet Take 1 tablet (40 mg total) by mouth 2 (two) times daily. (Patient taking differently: Take 40 mg by mouth 2 (two) times daily. 1 tablet daily) 180 tablet 3  . furosemide (LASIX) 40 MG tablet Take 0.5-1 tablets (20-40 mg total) by mouth daily.     No facility-administered medications prior to visit.     Per HPI unless specifically indicated in ROS section below Review of  Systems Objective:  BP 138/72   Pulse 66   Temp 98.1 F (36.7 C) (Temporal)   Ht $R'5\' 10"'kS$  (1.778 m)   Wt (!) 325 lb 9 oz (147.7 kg)   SpO2 95%   BMI 46.71 kg/m   Wt Readings from Last 3 Encounters:  01/08/21 (!) 325 lb 9 oz (147.7 kg)  07/24/20 (!) 312 lb (141.5 kg)  07/01/20 (!) 312 lb 8 oz (141.7 kg)      Physical Exam Vitals and nursing note reviewed.  Constitutional:      Appearance: Normal appearance. He is obese. He is not ill-appearing.  Cardiovascular:     Rate and Rhythm: Normal rate and regular rhythm.     Pulses: Normal pulses.     Heart sounds: Normal heart sounds. No murmur heard.   Pulmonary:     Effort: Pulmonary effort is normal. No respiratory distress.     Breath sounds: Normal breath sounds. No wheezing, rhonchi or rales.  Musculoskeletal:     Right lower leg: No edema.     Left lower leg: No edema.  Neurological:     Mental Status: He is alert.  Psychiatric:        Mood and Affect: Mood normal.        Behavior: Behavior normal.       Results for orders placed or performed in visit on 01/08/21  Comprehensive metabolic panel  Result Value Ref Range   Sodium 140 135 - 145 mEq/L   Potassium 3.6 3.5 - 5.1 mEq/L   Chloride 104 96 - 112 mEq/L   CO2 28 19 - 32 mEq/L   Glucose, Bld 116 (H) 70 - 99 mg/dL   BUN 22 6 - 23 mg/dL   Creatinine, Ser 1.36 0.40 - 1.50 mg/dL   Total Bilirubin 0.3 0.2 - 1.2 mg/dL   Alkaline Phosphatase 68 39 - 117 U/L   AST 19 0 - 37 U/L   ALT 19 0 - 53 U/L   Total Protein 6.3 6.0 - 8.3 g/dL   Albumin 4.0 3.5 - 5.2 g/dL   GFR 51.72 (L) >60.00 mL/min   Calcium 9.2 8.4 - 10.5 mg/dL  Hemoglobin A1c  Result Value Ref Range   Hgb A1c MFr Bld 6.7 (H) 4.6 - 6.5 %  CBC with Differential/Platelet  Result Value Ref Range   WBC 8.1 4.0 - 10.5 K/uL   RBC 4.51 4.22 - 5.81 Mil/uL   Hemoglobin 13.7 13.0 - 17.0 g/dL   HCT 41.1 39.0 - 52.0 %   MCV 91.0 78.0 - 100.0 fl   MCHC 33.3 30.0 - 36.0 g/dL   RDW 13.9 11.5 - 15.5 %    Platelets 180.0 150.0 - 400.0 K/uL   Neutrophils Relative % 72.3 43.0 -  77.0 %   Lymphocytes Relative 16.8 12.0 - 46.0 %   Monocytes Relative 7.9 3.0 - 12.0 %   Eosinophils Relative 2.0 0.0 - 5.0 %   Basophils Relative 1.0 0.0 - 3.0 %   Neutro Abs 5.8 1.4 - 7.7 K/uL   Lymphs Abs 1.4 0.7 - 4.0 K/uL   Monocytes Absolute 0.6 0.1 - 1.0 K/uL   Eosinophils Absolute 0.2 0.0 - 0.7 K/uL   Basophils Absolute 0.1 0.0 - 0.1 K/uL  Protime-INR  Result Value Ref Range   INR 1.0 0.8 - 1.0 ratio   Prothrombin Time 11.1 9.6 - 13.1 sec  Brain natriuretic peptide  Result Value Ref Range   Pro B Natriuretic peptide (BNP) 32.0 0.0 - 100.0 pg/mL  POCT Urinalysis Dipstick (Automated)  Result Value Ref Range   Color, UA yellow    Clarity, UA clear    Glucose, UA Negative Negative   Bilirubin, UA 1+    Ketones, UA +/-    Spec Grav, UA >=1.030 (A) 1.010 - 1.025   Blood, UA negative    pH, UA 6.0 5.0 - 8.0   Protein, UA Positive (A) Negative   Urobilinogen, UA 0.2 0.2 or 1.0 E.U./dL   Nitrite, UA negative    Leukocytes, UA Negative Negative   DG Chest 2 View CLINICAL DATA:  75 year old male with a history of upcoming surgery  EXAM: CHEST - 2 VIEW  COMPARISON:  02/25/2018  FINDINGS: Cardiomediastinal silhouette unchanged in size and contour. No evidence of central vascular congestion. No pneumothorax or pleural effusion.  Stigmata of emphysema, with increased retrosternal airspace, flattened hemidiaphragms, increased AP diameter, and hyperinflation on the AP view.  No confluent airspace disease with coarsened interstitial markings bilaterally.  Degenerative changes of the spine.  No acute displaced fracture.  IMPRESSION: Chronic lung changes without evidence of acute cardiopulmonary disease  Electronically Signed   By: Corrie Mckusick D.O.   On: 01/10/2021 11:29   EKG - NSR rate ~80, mild RAD, 1st degree AVB, PAC, no hypertrophy or acute ST/T changes, incomplete RBBB, unchanged from  prior.  Assessment & Plan:  This visit occurred during the SARS-CoV-2 public health emergency.  Safety protocols were in place, including screening questions prior to the visit, additional usage of staff PPE, and extensive cleaning of exam room while observing appropriate contact time as indicated for disinfecting solutions.   Problem List Items Addressed This Visit    Obstructive sleep apnea    Continue CPAP use.       Dyslipidemia associated with type 2 diabetes mellitus ([place])    Continue crestor.  The ASCVD Risk score Mikey Bussing DC Jr., et al., 2013) failed to calculate for the following reasons:   The valid total cholesterol range is 130 to 320 mg/dL       Hypertension   Relevant Medications   furosemide (LASIX) 40 MG tablet   S/P AAA (abdominal aortic aneurysm) repair   Relevant Orders   Ambulatory referral to Cardiology   Morbid obesity with BMI of 40.0-44.9, adult ([place])   AAA (abdominal aortic aneurysm) without rupture ([place])    Due for follow up - desires to establish with cardiology local to [facility] where they have moved.      Relevant Medications   furosemide (LASIX) 40 MG tablet   Pre-op evaluation - Primary    RCRI = 1 (suspected HFpEF)  Anticipate adequately low risk to proceed with planned L shoulder RTC repair.  Will send labs and results from today attn  Dr Marlou Sa.       Relevant Orders   EKG 12-Lead (Completed)   Comprehensive metabolic panel (Completed)   Hemoglobin A1c (Completed)   CBC with Differential/Platelet (Completed)   Protime-INR (Completed)   Brain natriuretic peptide (Completed)   DG Chest 2 View (Completed)   POCT Urinalysis Dipstick (Automated) (Completed)   Controlled diabetes mellitus type 2 with complications (HCC)    Continue metformin 500mg  once daily.  Update A1c.       Diastolic dysfunction with chronic heart failure [facility])    Nuclear stress test lexiscan - incomplete RBBB and LPFB, EF 63%, normal wall motion, moderate fixed perfusion  defect in basal-apical myocardium likely tissue attenuation, fair quality intermediate risk study (03/2018) Clinically suspected dCHF      Relevant Medications   furosemide (LASIX) 40 MG tablet   Other Relevant Orders   Ambulatory referral to Cardiology   COPD (chronic obstructive pulmonary disease) ([place])    Found on imaging, clinically stable off respiratory medication.       Left rotator cuff tear       No orders of the defined types were placed in this encounter.  Orders Placed This Encounter  Procedures  . DG Chest 2 View    Standing Status:   Future    Number of Occurrences:   1    Standing Expiration Date:   01/08/2022    Order Specific Question:   Reason for Exam (SYMPTOM  OR DIAGNOSIS REQUIRED)    Answer:   preop eval    Order Specific Question:   Preferred imaging location?    Answer:   [facility]  . Comprehensive metabolic panel  . Hemoglobin A1c  . CBC with Differential/Platelet  . Protime-INR  . Brain natriuretic peptide  . Ambulatory referral to Cardiology    Referral Priority:   Routine    Referral Type:   Consultation    Referral Reason:   Specialty Services Required    Requested Specialty:   Cardiology    Number of Visits Requested:   1  . POCT Urinalysis Dipstick (Automated)  . EKG 12-Lead    Patient Instructions  Labs today. Urinalysis today.  Chest Xray and EKG today.  You are due for follow up CT to check abdominal aneurysm - we [place]  We will today's forward results to Dr Tonita Cong.    Follow up plan: Return if symptoms worsen or fail to improve.  Ria Bush, MD

## 2021-01-08 NOTE — Patient Instructions (Addendum)
Labs today. Urinalysis today.  Chest Xray and EKG today.  You are due for follow up CT to check abdominal aneurysm - we [place]  We will today's forward results to Dr Tonita Cong.

## 2021-01-08 NOTE — Assessment & Plan Note (Signed)
Found on imaging, clinically stable off respiratory medication.

## 2021-01-08 NOTE — Assessment & Plan Note (Signed)
RCRI = 1 (suspected HFpEF)  Anticipate adequately low risk to proceed with planned L shoulder RTC repair.  Will send labs and results from today attn Dr Marlou Sa.

## 2021-01-09 LAB — COMPREHENSIVE METABOLIC PANEL
ALT: 19 U/L (ref 0–53)
AST: 19 U/L (ref 0–37)
Albumin: 4 g/dL (ref 3.5–5.2)
Alkaline Phosphatase: 68 U/L (ref 39–117)
BUN: 22 mg/dL (ref 6–23)
CO2: 28 mEq/L (ref 19–32)
Calcium: 9.2 mg/dL (ref 8.4–10.5)
Chloride: 104 mEq/L (ref 96–112)
Creatinine, Ser: 1.36 mg/dL (ref 0.40–1.50)
GFR: 51.72 mL/min — ABNORMAL LOW (ref >60.00)
Glucose, Bld: 116 mg/dL — ABNORMAL HIGH (ref 70–99)
Potassium: 3.6 mEq/L (ref 3.5–5.1)
Sodium: 140 mEq/L (ref 135–145)
Total Bilirubin: 0.3 mg/dL (ref 0.2–1.2)
Total Protein: 6.3 g/dL (ref 6.0–8.3)

## 2021-01-09 LAB — BRAIN NATRIURETIC PEPTIDE: Pro B Natriuretic peptide (BNP): 32 pg/mL (ref 0.0–100.0)

## 2021-01-09 LAB — CBC WITH DIFFERENTIAL/PLATELET
Basophils Absolute: 0.1 10*3/uL (ref 0.0–0.1)
Basophils Relative: 1 % (ref 0.0–3.0)
Eosinophils Absolute: 0.2 10*3/uL (ref 0.0–0.7)
Eosinophils Relative: 2 % (ref 0.0–5.0)
HCT: 41.1 % (ref 39.0–52.0)
Hemoglobin: 13.7 g/dL (ref 13.0–17.0)
Lymphocytes Relative: 16.8 % (ref 12.0–46.0)
Lymphs Abs: 1.4 10*3/uL (ref 0.7–4.0)
MCHC: 33.3 g/dL (ref 30.0–36.0)
MCV: 91 fl (ref 78.0–100.0)
Monocytes Absolute: 0.6 10*3/uL (ref 0.1–1.0)
Monocytes Relative: 7.9 % (ref 3.0–12.0)
Neutro Abs: 5.8 10*3/uL (ref 1.4–7.7)
Neutrophils Relative %: 72.3 % (ref 43.0–77.0)
Platelets: 180 10*3/uL (ref 150.0–400.0)
RBC: 4.51 Mil/uL (ref 4.22–5.81)
RDW: 13.9 % (ref 11.5–15.5)
WBC: 8.1 10*3/uL (ref 4.0–10.5)

## 2021-01-09 LAB — HEMOGLOBIN A1C: Hgb A1c MFr Bld: 6.7 % — ABNORMAL HIGH (ref 4.6–6.5)

## 2021-01-09 LAB — PROTIME-INR
INR: 1 ratio (ref 0.8–1.0)
Prothrombin Time: 11.1 s (ref 9.6–13.1)

## 2021-01-14 ENCOUNTER — Telehealth: Payer: Self-pay

## 2021-01-14 NOTE — Assessment & Plan Note (Signed)
Continue CPAP use

## 2021-01-14 NOTE — Assessment & Plan Note (Signed)
Nuclear stress test lexiscan - incomplete RBBB and LPFB, EF 63%, normal wall motion, moderate fixed perfusion defect in basal-apical myocardium likely tissue attenuation, fair quality intermediate risk study (03/2018) Clinically suspected dCHF

## 2021-01-14 NOTE — Telephone Encounter (Signed)
Pt's wife, Carlyon Shadow (on dpr), returning call.  Says referral needs to go to:  [facility] [address] [place], [place]  [address]  P:  [phone] F:  [phone]  You can contact office mgr, Taskeen Midgette at [facility] # above, [phone] with any questions/concerns.

## 2021-01-14 NOTE — Assessment & Plan Note (Signed)
Continue metformin 500mg  once daily.  Update A1c.

## 2021-01-14 NOTE — Assessment & Plan Note (Addendum)
Due for follow up - desires to establish with cardiology local to [facility] where they have moved.

## 2021-01-14 NOTE — Assessment & Plan Note (Signed)
Continue crestor.  The ASCVD Risk score William Bussing DC Jr., William al., William Hebert) failed to calculate for the following reasons:   The valid total cholesterol range is 130 to 320 mg/dL

## 2021-01-14 NOTE — Telephone Encounter (Signed)
Lvm asking pt to call back.  Per As[place], pt needs to research and decide where they want to be referred.  Once we have a location we can send the referral to that specific location.  We are not familiar with [place] area. It will be helpful to know a specific location/doctor to attach to the referral for f/u CT.

## 2021-01-21 NOTE — Progress Notes (Signed)
  CCM care plan from [place] appointment on 12/31/20 with Debbora Dus, Pharm. D, was mailed to him per his request.   Debbora Dus, CPP notified  Margaretmary Dys, [facility] Assistant [phone]

## 2021-01-28 ENCOUNTER — Telehealth: Payer: Self-pay | Admitting: Family Medicine

## 2021-01-28 NOTE — Telephone Encounter (Signed)
Patient wife Carlyon Shadow call in requesting info about Pre op evaluation paperwork would like a call back

## 2021-01-29 NOTE — Telephone Encounter (Signed)
Spoke with pt/pt's wife, Carlyon Shadow (on dpr).  States [facility] has not received pre-op form back. Inform them I'll refax form.   I printed and faxed form today to:  Attn:  Orson Slick at [phone].

## 2021-01-31 ENCOUNTER — Ambulatory Visit: Payer: Medicare Other | Admitting: Family Medicine

## 2021-02-03 ENCOUNTER — Ambulatory Visit: Payer: Self-pay | Admitting: Orthopedic Surgery

## 2021-02-03 NOTE — H&P (View-Only) (Signed)
William Hebert is an 75 y.o. male.   Chief Complaint: left shoulder pain HPI: Visit For: Follow up Hand Dominance: right Location: left; shoulder Duration: The patient states that he moved 1 month ago to [facility] and aggravated the shoulder during the move Severity: pain level 8/10 Alleviating Factors: Tylenol and Motrin Aggravating Factors: ROM He reported the pain is worse than when last seen.  Past Medical History:  Diagnosis Date  . AAA (abdominal aortic aneurysm) without rupture ([place]) 07/10/2014   S/p endovascular repair 2012 with stent Kellie Simmering), yearly f/u  . Arthritis   . Cancer ([place])    skin cancer non melanioma  . Chronic bronchitis ([place]) 11/2016 AND 10-2016  . COPD (chronic obstructive pulmonary disease) (HCC)    chronic bronchitis  . GERD (gastroesophageal reflux disease)   . Headache(784.0)    in 20's  . Hepatitis    Carrier cant give blood gives a false positive  . Hernia of abdominal wall    LARGE FROM BREAST BONE AREA TO BELLY BUTTON  . History of skin cancer 2014  . HTN (hypertension)   . Hyperlipidemia   . Hypogonadism in male    prior on testosterone cream  . Morbidly obese ([place])   . Pneumonia   . PONV (postoperative nausea and vomiting)    PONV AFTER AAA REPAIR, ALL OTHER SURGERIES WENT OK  . Posterior vitreous detachment 02/2018  . Shortness of breath    WITH EXERTION  . Sleep apnea    CPAP     Past Surgical History:  Procedure Laterality Date  . ABDOMINAL AORTIC ANEURYSM REPAIR  05/20/2011   enodvascular aorto bifem. stent graft Kellie Simmering)  . CATARACT EXTRACTION Bilateral 2014   X 2 BOTH EYES DONE TWICE  . COLONOSCOPY WITH PROPOFOL N/A 02/23/2017   4 TA polyps, rpt 3-5 yrs ([place])  . ELBOW SURGERY  2012  . FOOT TENDON SURGERY Right   . KNEE SURGERY Right remote   arthroscopy x2  . MASS EXCISION N/A 05/10/2013   Procedure: EXCISION back MASS - cyst Marcello Moores A. Cornett, MD)  . SHOULDER OPEN ROTATOR CUFF REPAIR Right 07/08/2017   Procedure:  Right shoulder mini open rotator cuff repair;  Surgeon: Susa Day, MD;  Location: [facility];  Service: Orthopedics;  Laterality: Right;  90 mins; Interscalene Block  . SKIN SURGERY     2 different surgeries face   . UMBILICAL HERNIA REPAIR  2002    Family History  Problem Relation Age of Onset  . Cancer Mother 22       throat (nonsmoker)  . Heart disease Maternal Aunt   . Alcohol abuse Father        deceased 98yo   Social History:  reports that he quit smoking about 9 years ago. His smoking use included cigarettes. He has a 76.50 pack-year smoking history. He quit smokeless tobacco use about 14 years ago.  His smokeless tobacco use included snuff and chew. He reports current alcohol use. He reports that he does not use drugs.  Allergies: No Known Allergies  Current Medications: Accu-Chek Guide Me Glucose Meter Accu-Chek Guide test strips Accu-Chek Softclix Lancets amoxicillin 500 mg tablet cholecalciferol (vitamin D3) 1,250 mcg (50,000 unit) capsule clindamycin HCL 150 mg capsule furosemide 40 mg tablet glipiZIDE 5 mg tablet ibuprofen 800 mg tablet labetaloL 200 mg tablet metFORMIN 500 mg tablet NIFEdipine ER 60 mg tablet,extended release 24 hr omeprazole 20 mg capsule,delayed release omeprazole 40 mg capsule,delayed release potassium chloride ER 20 mEq tablet,extended  release(part/cryst) Robaxin 500 mg tablet rosuvastatin 20 mg tablet valsartan 80 mg tablet valsartan 80 mg-hydrochlorothiazide 12.5 mg tablet  Review of Systems  Constitutional: Negative.   HENT: Negative.   Eyes: Negative.   Respiratory: Negative.   Cardiovascular: Negative.   Gastrointestinal: Negative.   Endocrine: Negative.   Genitourinary: Negative.   Musculoskeletal: Positive for arthralgias, back pain, joint swelling and myalgias.  Neurological: Negative.   Psychiatric/Behavioral: Negative.     There were no vitals taken for this visit. Physical Exam Constitutional:      Appearance: He  is obese.  HENT:     Head: Normocephalic and atraumatic.     Right Ear: External ear normal.     Left Ear: External ear normal.     Nose: Nose normal.     Mouth/Throat:     Pharynx: Oropharynx is clear.  Eyes:     Conjunctiva/sclera: Conjunctivae normal.  Cardiovascular:     Rate and Rhythm: Normal rate and regular rhythm.     Pulses: Normal pulses.     Heart sounds: Normal heart sounds.  Pulmonary:     Effort: Pulmonary effort is normal.     Breath sounds: Normal breath sounds.  Abdominal:     General: Bowel sounds are normal.  Musculoskeletal:     Cervical back: Normal range of motion.     Comments: Constitutional: General Appearance: healthy-appearing and NAD.  Psychiatric: Mood and Affect: normal mood and affect.  Cardiovascular System: Arterial Pulses Left: radial normal and brachial normal. Edema Left: none. Varicosities Right: no varicosities. Varicosities Left: no varicosities.  C-Spine/Neck: Active Range of Motion: flexion normal, extension normal, and no pain elicited on motion.  Shoulders: Inspection Left: no misalignment, atrophy, erythema, swelling, or scapular winging. Bony Palpation Left: no tenderness of the sternoclavicular joint, the coracoid process, the acromioclavicular joint, the bicipital groove, or the scapula. Soft Tissue Palpation Left: no tenderness of the infraspinatus, the teres minor, the subacromial bursa, the axilla, the glenohumeral joint region, the pectoralis major insertion, the sternocleidomastoid, the costochondral junction, the trapezius, the rhomboid, the latissimus dorsi, the serratus, the deltoid, the levator scapulae, or the lateral cuff insertion and tenderness of the supraspinatus and the subdeltoid bursa. Active Range of Motion Left: limited. Special Tests Left: Speed's test negative and Neer's test positive. Stability Left: no laxity, sulcus sign negative, and anterior apprehension test negative. Strength Left: abduction 5/5, adduction 5/5,  flexion 5/5, and extension 5/5.  Skin: Left Upper Extremity: normal.  Neurological System: Biceps Reflex Left: normal (2). Brachioradialis Reflex Left: normal (2). Triceps Reflex Left: normal (2). Sensation on the Left: C5 normal, C6 normal, and C7 normal.  Neurological:     Mental Status: He is alert.    Previous MRI demonstrates partial tear of the rotator cuff. Mainly supraspinatus.  Lateral sloping acromion. Mild AC arthrosis   Assessment/Plan Impression:  Patient has had persistent pain from partial tear of the rotator cuff when last seen. [place] he [place] his shoulder more to the pain is worse and is likely progressed in the tear.  Plan:  We discussed options at this point given these likely progressed his tear and then it was not doing well even prior to the reinjury. We discussed a rotator cuff repair and subacromial decompression and possible patch graft given that he is diabetic.  An extensive discussion concerning the pathology relevant anatomy and treatment options. After that discussion we mutually agreed to proceed with repair of the rotator cuff utilizing arthroscopic  assistance if possible. The risks and benefits of that procedure were discussed including bleeding, infection, suboptimal range of motion, deep venous thrombosis, pulmonary embolism, anesthetic complications etc. in addition we discussed the postoperative course to include approximately 4 weeks of passive range of motion followed by 4 weeks of active range of motion followed by 4-12 weeks of progressive strengthening exercises. In addition we discussed protective activities to reduce the risk of a reinjury including impingement activities with elbow above the shoulder as well as reaching and repetitive circular motion activities. The hospital stay will either be as a outpatient with a regional block versus overnight depending upon the extent of the procedure and any challenging health issues  with a first postoperative visit 2 weeks following the surgery.  Preoperative clearance. He would like to make it a same day. And then do his exercises on his own.  Plan left shoulder mini-open RCR, SAD, possible patch graft  Cecilie Kicks, PA-C for Dr. Tonita Cong 02/03/2021, 11:54 AM

## 2021-02-03 NOTE — H&P (Signed)
William Hebert is an 75 y.o. male.   Chief Complaint: left shoulder pain HPI: Visit For: Follow up Hand Dominance: right Location: left; shoulder Duration: The patient states that he moved 1 month ago to [facility] and aggravated the shoulder during the move Severity: pain level 8/10 Alleviating Factors: Tylenol and Motrin Aggravating Factors: ROM He reported the pain is worse than when last seen.  Past Medical History:  Diagnosis Date  . AAA (abdominal aortic aneurysm) without rupture ([place]) 07/10/2014   S/p endovascular repair 2012 with stent Kellie Simmering), yearly f/u  . Arthritis   . Cancer ([place])    skin cancer non melanioma  . Chronic bronchitis ([place]) 11/2016 AND 10-2016  . COPD (chronic obstructive pulmonary disease) (HCC)    chronic bronchitis  . GERD (gastroesophageal reflux disease)   . Headache(784.0)    in 20's  . Hepatitis    Carrier cant give blood gives a false positive  . Hernia of abdominal wall    LARGE FROM BREAST BONE AREA TO BELLY BUTTON  . History of skin cancer 2014  . HTN (hypertension)   . Hyperlipidemia   . Hypogonadism in male    prior on testosterone cream  . Morbidly obese ([place])   . Pneumonia   . PONV (postoperative nausea and vomiting)    PONV AFTER AAA REPAIR, ALL OTHER SURGERIES WENT OK  . Posterior vitreous detachment 02/2018  . Shortness of breath    WITH EXERTION  . Sleep apnea    CPAP     Past Surgical History:  Procedure Laterality Date  . ABDOMINAL AORTIC ANEURYSM REPAIR  05/20/2011   enodvascular aorto bifem. stent graft Kellie Simmering)  . CATARACT EXTRACTION Bilateral 2014   X 2 BOTH EYES DONE TWICE  . COLONOSCOPY WITH PROPOFOL N/A 02/23/2017   4 TA polyps, rpt 3-5 yrs ([place])  . ELBOW SURGERY  2012  . FOOT TENDON SURGERY Right   . KNEE SURGERY Right remote   arthroscopy x2  . MASS EXCISION N/A 05/10/2013   Procedure: EXCISION back MASS - cyst Marcello Moores A. Cornett, MD)  . SHOULDER OPEN ROTATOR CUFF REPAIR Right 07/08/2017   Procedure:  Right shoulder mini open rotator cuff repair;  Surgeon: Susa Day, MD;  Location: [facility];  Service: Orthopedics;  Laterality: Right;  90 mins; Interscalene Block  . SKIN SURGERY     2 different surgeries face   . UMBILICAL HERNIA REPAIR  2002    Family History  Problem Relation Age of Onset  . Cancer Mother 27       throat (nonsmoker)  . Heart disease Maternal Aunt   . Alcohol abuse Father        deceased 3yo   Social History:  reports that he quit smoking about 9 years ago. His smoking use included cigarettes. He has a 76.50 pack-year smoking history. He quit smokeless tobacco use about 14 years ago.  His smokeless tobacco use included snuff and chew. He reports current alcohol use. He reports that he does not use drugs.  Allergies: No Known Allergies  Current Medications: Accu-Chek Guide Me Glucose Meter Accu-Chek Guide test strips Accu-Chek Softclix Lancets amoxicillin 500 mg tablet cholecalciferol (vitamin D3) 1,250 mcg (50,000 unit) capsule clindamycin HCL 150 mg capsule furosemide 40 mg tablet glipiZIDE 5 mg tablet ibuprofen 800 mg tablet labetaloL 200 mg tablet metFORMIN 500 mg tablet NIFEdipine ER 60 mg tablet,extended release 24 hr omeprazole 20 mg capsule,delayed release omeprazole 40 mg capsule,delayed release potassium chloride ER 20 mEq tablet,extended  release(part/cryst) Robaxin 500 mg tablet rosuvastatin 20 mg tablet valsartan 80 mg tablet valsartan 80 mg-hydrochlorothiazide 12.5 mg tablet  Review of Systems  Constitutional: Negative.   HENT: Negative.   Eyes: Negative.   Respiratory: Negative.   Cardiovascular: Negative.   Gastrointestinal: Negative.   Endocrine: Negative.   Genitourinary: Negative.   Musculoskeletal: Positive for arthralgias, back pain, joint swelling and myalgias.  Neurological: Negative.   Psychiatric/Behavioral: Negative.     There were no vitals taken for this visit. Physical Exam Constitutional:      Appearance: He  is obese.  HENT:     Head: Normocephalic and atraumatic.     Right Ear: External ear normal.     Left Ear: External ear normal.     Nose: Nose normal.     Mouth/Throat:     Pharynx: Oropharynx is clear.  Eyes:     Conjunctiva/sclera: Conjunctivae normal.  Cardiovascular:     Rate and Rhythm: Normal rate and regular rhythm.     Pulses: Normal pulses.     Heart sounds: Normal heart sounds.  Pulmonary:     Effort: Pulmonary effort is normal.     Breath sounds: Normal breath sounds.  Abdominal:     General: Bowel sounds are normal.  Musculoskeletal:     Cervical back: Normal range of motion.     Comments: Constitutional: General Appearance: healthy-appearing and NAD.  Psychiatric: Mood and Affect: normal mood and affect.  Cardiovascular System: Arterial Pulses Left: radial normal and brachial normal. Edema Left: none. Varicosities Right: no varicosities. Varicosities Left: no varicosities.  C-Spine/Neck: Active Range of Motion: flexion normal, extension normal, and no pain elicited on motion.  Shoulders: Inspection Left: no misalignment, atrophy, erythema, swelling, or scapular winging. Bony Palpation Left: no tenderness of the sternoclavicular joint, the coracoid process, the acromioclavicular joint, the bicipital groove, or the scapula. Soft Tissue Palpation Left: no tenderness of the infraspinatus, the teres minor, the subacromial bursa, the axilla, the glenohumeral joint region, the pectoralis major insertion, the sternocleidomastoid, the costochondral junction, the trapezius, the rhomboid, the latissimus dorsi, the serratus, the deltoid, the levator scapulae, or the lateral cuff insertion and tenderness of the supraspinatus and the subdeltoid bursa. Active Range of Motion Left: limited. Special Tests Left: Speed's test negative and Neer's test positive. Stability Left: no laxity, sulcus sign negative, and anterior apprehension test negative. Strength Left: abduction 5/5, adduction 5/5,  flexion 5/5, and extension 5/5.  Skin: Left Upper Extremity: normal.  Neurological System: Biceps Reflex Left: normal (2). Brachioradialis Reflex Left: normal (2). Triceps Reflex Left: normal (2). Sensation on the Left: C5 normal, C6 normal, and C7 normal.  Neurological:     Mental Status: He is alert.    Previous MRI demonstrates partial tear of the rotator cuff. Mainly supraspinatus.  Lateral sloping acromion. Mild AC arthrosis   Assessment/Plan Impression:  Patient has had persistent pain from partial tear of the rotator cuff when last seen. [place] and he injured his shoulder more to the pain is worse and is likely progressed in the tear.  Plan:  We discussed options at this point given these likely progressed his tear and then it was not doing well even prior to the reinjury. We discussed a rotator cuff repair and subacromial decompression and possible patch graft given that he is diabetic.  An extensive discussion concerning the pathology relevant anatomy and treatment options. After that discussion we mutually agreed to proceed with repair of the rotator cuff utilizing arthroscopic  assistance if possible. The risks and benefits of that procedure were discussed including bleeding, infection, suboptimal range of motion, deep venous thrombosis, pulmonary embolism, anesthetic complications etc. in addition we discussed the postoperative course to include approximately 4 weeks of passive range of motion followed by 4 weeks of active range of motion followed by 4-12 weeks of progressive strengthening exercises. In addition we discussed protective activities to reduce the risk of a reinjury including impingement activities with elbow above the shoulder as well as reaching and repetitive circular motion activities. The hospital stay will either be as a outpatient with a regional block versus overnight depending upon the extent of the procedure and any challenging health issues  with a first postoperative visit 2 weeks following the surgery.  Preoperative clearance. He would like to make it a same day. And then do his exercises on his own.  Plan left shoulder mini-open RCR, SAD, possible patch graft  Cecilie Kicks, PA-C for Dr. Tonita Cong 02/03/2021, 11:54 AM

## 2021-02-07 ENCOUNTER — Telehealth: Payer: Self-pay

## 2021-02-07 NOTE — Telephone Encounter (Signed)
Received faxed message from [place] of [facility] stating they received rx for DM shoes.  They are requesting clinical notes for DM and pt's demographics.  Fax info to [phone].  Faxed notes.

## 2021-02-08 NOTE — Telephone Encounter (Signed)
I received lab request from Dr Reather Littler office to draw BMP and CBC. Pt had labs done 01/08/2021 - do they need to repeat these labs prior to upcoming ortho surgery scheduled 02/21/2021?

## 2021-02-11 NOTE — Telephone Encounter (Signed)
Returned call from [employer].  States pt does not need labs here.  He's going to [facility] for labs.

## 2021-02-11 NOTE — Telephone Encounter (Signed)
Spoke with Kennyth Lose (?), of [facility], relaying Dr. Synthia Innocent message.  Says she will check into it and call back.

## 2021-02-12 ENCOUNTER — Telehealth: Payer: Self-pay

## 2021-02-12 NOTE — Telephone Encounter (Signed)
Noted.  See TE, 01/28/21.

## 2021-02-12 NOTE — Telephone Encounter (Signed)
Received a call from [place] at [facility] in regards to patient. William Hebert does not want to do blood work there any more and would like to go somewhere closer to home.

## 2021-02-14 NOTE — Progress Notes (Addendum)
COVID Vaccine Completed: Yes Date COVID Vaccine completed: 07/04/20 Has received booster: No COVID vaccine manufacturer: Pfizer      Date of COVID positive in last 90 days: No  PCP - Ria Bush, MD last office visit 01/14/2021 Cardiologist - Adrian Prows MD/[place] Artis Delay, MSN, APRN, FNP-C last office 03/08/2019 in epic   Chest x-ray - 01/08/21 in epic EKG - 01/08/21 in epic Stress Test - greater than 2 years in epic ECHO - greater than 2 years in epic Cardiac Cath - N/A Pacemaker/ICD device last checked: N/A  Sleep Study - Yes CPAP - Yes  Fasting blood glucose- N/A Glucose monitoring- once daily not fasting  Blood Thinner Instructions: N/A Aspirin Instructions: Aspirin Last Dose:02/14/2021  Activity level:  Can go up a flight of stairs and activities of daily living without stopping and without symptoms       Anesthesia review: HTN, COPD, OSA, AAA  Patient denies shortness of breath, fever, cough and chest pain at PAT appointment   Patient verbalized understanding of instructions that were given to them at the PAT appointment. Patient was also instructed that they will need to review over the PAT instructions again at home before surgery.

## 2021-02-18 ENCOUNTER — Encounter (HOSPITAL_COMMUNITY): Payer: Self-pay | Admitting: Specialist

## 2021-02-18 ENCOUNTER — Telehealth: Payer: Self-pay

## 2021-02-18 ENCOUNTER — Other Ambulatory Visit: Payer: Self-pay

## 2021-02-18 NOTE — Chronic Care Management (AMB) (Addendum)
Chronic Care Management Pharmacy Assistant   Name: William Hebert  MRN: [id number] DOB: April 12, 1946  Reason for Encounter:  CCM Reminder Call   Recent office visits:  01/08/21 - Dr.Gutierrez, PCP - No medication changes  Recent consult visits:  02/03/21 [facility] visits:  None in previous 6 months  Medications: Outpatient Encounter Medications as of 02/18/2021  Medication Sig   Accu-Chek FastClix Lancets MISC Use as instructed to check blood sugar once daily.   acetaminophen (TYLENOL) 500 MG tablet Take 1,000 mg by mouth every 6 (six) hours as needed for moderate pain or mild pain.   albuterol (PROVENTIL HFA;VENTOLIN HFA) 108 (90 Base) MCG/ACT inhaler Inhale 1-2 puffs into the lungs every 4 (four) hours as needed for wheezing or shortness of breath.   Ascorbic Acid (VITAMIN C PO) Take 1,000 mg by mouth daily. Super C   aspirin 81 MG chewable tablet Chew 81 mg by mouth daily.   B Complex-C (SUPER B COMPLEX PO) Take 1 tablet by mouth daily.   Blood Glucose Monitoring Suppl (ACCU-CHEK GUIDE) w/Device KIT Use as instructed to check blood sugar once daily.   D3-50 1.25 MG (50000 UT) capsule TAKE 50,000 UNITS (1 CAPSULE) BY MOUTH EVERY FRIDAY. IN THE MORNING. (Patient taking differently: Take 50,000 Units by mouth once a week. Friday)   furosemide (LASIX) 40 MG tablet Take 40 mg by mouth daily.   glucose blood (ACCU-CHEK GUIDE) test strip Use as instructed to check blood sugar once daily.   glucose blood (ACCU-CHEK GUIDE) test strip Use as instructed   ibuprofen (ADVIL) 600 MG tablet Take 1 tablet (600 mg total) by mouth every 8 (eight) hours as needed for moderate pain. (Patient taking differently: Take 800 mg by mouth every 8 (eight) hours as needed for moderate pain.)   labetalol (NORMODYNE) 200 MG tablet Take 1 tablet (200 mg total) by mouth daily.   metFORMIN (GLUCOPHAGE) 500 MG tablet Take 1 tablet (500 mg total) by mouth daily with breakfast.   methocarbamol (ROBAXIN)  500 MG tablet Take 1-2 tablets (500-1,000 mg total) by mouth 2 (two) times daily as needed for muscle spasms (sedation precautions).   Multiple Vitamins-Minerals (MULTIVITAMIN ADULT PO) Take 1 tablet by mouth daily. MEN'S 50+ MULTIVITAMIN.   NIFEdipine (PROCARDIA XL/NIFEDICAL XL) 60 MG 24 hr tablet Take 1 tablet (60 mg total) by mouth daily.   omeprazole (PRILOSEC) 40 MG capsule Take 1 capsule (40 mg total) by mouth daily.   potassium chloride SA (KLOR-CON) 20 MEQ tablet Take 1 tablet (20 mEq total) by mouth daily.   rosuvastatin (CRESTOR) 20 MG tablet Take 1 tablet (20 mg total) by mouth daily.   valsartan (DIOVAN) 80 MG tablet Take 1 tablet (80 mg total) by mouth daily.   Zinc 100 MG TABS Take 1 tablet (100 mg total) by mouth daily. (Patient taking differently: Take 100 mg by mouth daily.)   No facility-administered encounter medications on file as of 02/18/2021.    William Hebert requested to cancel his CCM appointment with Debbora Dus on 02/24/2021 at 10:00 AM due to surgery. We will follow up in the next 2-3 weeks to reschedule.  Star Rating Drugs: Medication:  Last Fill: Day Supply Valsartan 75m 12/08/20 90 Rosuvastatin 227m4/10/22 90 Metformin 50049m/10/22 90 [facility]  VelAvel SensorCMEarlysistant [phone] have reviewed the care management and care coordination activities outlined in this encounter and I am certifying that I agree  with the content of this note. No further action required.  Debbora Dus, PharmD Clinical Pharmacist [facility] at [facility] [phone]

## 2021-02-21 ENCOUNTER — Encounter (HOSPITAL_COMMUNITY)
Admission: RE | Disposition: A | Discharge status: Home / Self Care | Payer: Self-pay | Source: Home / Self Care | Attending: Specialist

## 2021-02-21 ENCOUNTER — Ambulatory Visit (HOSPITAL_COMMUNITY): Payer: Medicare Other | Admitting: Physician Assistant

## 2021-02-21 ENCOUNTER — Encounter (HOSPITAL_COMMUNITY): Payer: Self-pay | Admitting: Specialist

## 2021-02-21 ENCOUNTER — Ambulatory Visit (HOSPITAL_COMMUNITY)
Admission: RE | Admit: 2021-02-21 | Discharge: 2021-02-21 | Disposition: A | Discharge status: Home / Self Care | Payer: Medicare Other | Attending: Specialist | Admitting: Specialist

## 2021-02-21 DIAGNOSIS — Z85828 Personal history of other malignant neoplasm of skin: Secondary | ICD-10-CM | POA: Diagnosis not present

## 2021-02-21 DIAGNOSIS — Z8249 Family history of ischemic heart disease and other diseases of the circulatory system: Secondary | ICD-10-CM | POA: Diagnosis not present

## 2021-02-21 DIAGNOSIS — Z87891 Personal history of nicotine dependence: Secondary | ICD-10-CM | POA: Diagnosis not present

## 2021-02-21 DIAGNOSIS — Z8679 Personal history of other diseases of the circulatory system: Secondary | ICD-10-CM | POA: Diagnosis not present

## 2021-02-21 DIAGNOSIS — Z801 Family history of malignant neoplasm of trachea, bronchus and lung: Secondary | ICD-10-CM | POA: Diagnosis not present

## 2021-02-21 DIAGNOSIS — M75102 Unspecified rotator cuff tear or rupture of left shoulder, not specified as traumatic: Secondary | ICD-10-CM | POA: Insufficient documentation

## 2021-02-21 DIAGNOSIS — M7542 Impingement syndrome of left shoulder: Secondary | ICD-10-CM | POA: Diagnosis not present

## 2021-02-21 HISTORY — DX: Edema, unspecified: R60.9

## 2021-02-21 HISTORY — DX: Localized edema: R60.0

## 2021-02-21 HISTORY — DX: Pain in unspecified knee: M25.569

## 2021-02-21 HISTORY — PX: SHOULDER ARTHROSCOPY WITH ROTATOR CUFF REPAIR AND SUBACROMIAL DECOMPRESSION: SHX5686

## 2021-02-21 HISTORY — DX: Personal history of other diseases of the circulatory system: Z86.79

## 2021-02-21 LAB — BASIC METABOLIC PANEL
Anion gap: 9 (ref 5–15)
BUN: 23 mg/dL (ref 8–23)
CO2: 26 mmol/L (ref 22–32)
Calcium: 9.3 mg/dL (ref 8.9–10.3)
Chloride: 107 mmol/L (ref 98–111)
Creatinine, Ser: 1.67 mg/dL — ABNORMAL HIGH (ref 0.61–1.24)
GFR, Estimated: 43 mL/min — ABNORMAL LOW (ref >60)
Glucose, Bld: 129 mg/dL — ABNORMAL HIGH (ref 70–99)
Potassium: 3.6 mmol/L (ref 3.5–5.1)
Sodium: 142 mmol/L (ref 135–145)

## 2021-02-21 LAB — CBC
HCT: 43.2 % (ref 39.0–52.0)
Hemoglobin: 14.1 g/dL (ref 13.0–17.0)
MCH: 30.7 pg (ref 26.0–34.0)
MCHC: 32.6 g/dL (ref 30.0–36.0)
MCV: 93.9 fL (ref 80.0–100.0)
Platelets: 179 10*3/uL (ref 150–400)
RBC: 4.6 MIL/uL (ref 4.22–5.81)
RDW: 13.3 % (ref 11.5–15.5)
WBC: 9.4 10*3/uL (ref 4.0–10.5)
nRBC: 0 % (ref 0.0–0.2)

## 2021-02-21 LAB — GLUCOSE, CAPILLARY: Glucose-Capillary: 135 mg/dL — ABNORMAL HIGH (ref 70–99)

## 2021-02-21 SURGERY — SHOULDER ARTHROSCOPY WITH ROTATOR CUFF REPAIR AND SUBACROMIAL DECOMPRESSION
Anesthesia: Regional | Laterality: Left

## 2021-02-21 MED ORDER — PROPOFOL 10 MG/ML IV BOLUS
INTRAVENOUS | Status: DC | PRN
  Administered 2021-02-21: 200 mg via INTRAVENOUS

## 2021-02-21 MED ORDER — PHENYLEPHRINE HCL-NACL 20-0.9 MG/250ML-% IV SOLN
INTRAVENOUS | Status: DC | PRN
  Administered 2021-02-21: 25 ug/min via INTRAVENOUS

## 2021-02-21 MED ORDER — POLYETHYLENE GLYCOL 3350 17 G PO PACK: 17.0000 g | PACK | Freq: Every day | ORAL | 0 refills | Status: DC

## 2021-02-21 MED ORDER — DEXAMETHASONE SODIUM PHOSPHATE 10 MG/ML IJ SOLN
INTRAMUSCULAR | Status: AC
  Filled 2021-02-21: qty 1

## 2021-02-21 MED ORDER — ONDANSETRON HCL 4 MG/2ML IJ SOLN
INTRAMUSCULAR | Status: DC | PRN
  Administered 2021-02-21: 4 mg via INTRAVENOUS

## 2021-02-21 MED ORDER — CEFAZOLIN IN SODIUM CHLORIDE 3-0.9 GM/100ML-% IV SOLN
3.0000 g | INTRAVENOUS | Status: AC
  Administered 2021-02-21: 3 g via INTRAVENOUS
  Filled 2021-02-21: qty 100

## 2021-02-21 MED ORDER — BUPIVACAINE LIPOSOME 1.3 % IJ SUSP
INTRAMUSCULAR | Status: DC | PRN
  Administered 2021-02-21: 10 mL via PERINEURAL

## 2021-02-21 MED ORDER — DEXAMETHASONE SODIUM PHOSPHATE 10 MG/ML IJ SOLN
INTRAMUSCULAR | Status: DC | PRN
  Administered 2021-02-21: 6 mg via INTRAVENOUS

## 2021-02-21 MED ORDER — EPINEPHRINE PF 1 MG/ML IJ SOLN: INTRAMUSCULAR | Status: DC | PRN

## 2021-02-21 MED ORDER — EPINEPHRINE PF 1 MG/ML IJ SOLN
INTRAMUSCULAR | Status: AC
  Filled 2021-02-21: qty 3

## 2021-02-21 MED ORDER — 0.9 % SODIUM CHLORIDE (POUR BTL) OPTIME
TOPICAL | Status: DC | PRN
  Administered 2021-02-21: 1000 mL

## 2021-02-21 MED ORDER — FENTANYL CITRATE (PF) 100 MCG/2ML IJ SOLN
INTRAMUSCULAR | Status: AC
  Filled 2021-02-21: qty 2

## 2021-02-21 MED ORDER — BUPIVACAINE-EPINEPHRINE (PF) 0.5% -1:200000 IJ SOLN
INTRAMUSCULAR | Status: AC
  Filled 2021-02-21: qty 30

## 2021-02-21 MED ORDER — SUGAMMADEX SODIUM 500 MG/5ML IV SOLN
INTRAVENOUS | Status: DC | PRN
  Administered 2021-02-21: 294.8 mg via INTRAVENOUS

## 2021-02-21 MED ORDER — LACTATED RINGERS IV SOLN: INTRAVENOUS | Status: DC

## 2021-02-21 MED ORDER — OXYCODONE HCL 10 MG PO TABS: 10.0000 mg | ORAL_TABLET | ORAL | 0 refills | Status: AC | PRN

## 2021-02-21 MED ORDER — BUPIVACAINE-EPINEPHRINE 0.5% -1:200000 IJ SOLN
INTRAMUSCULAR | Status: DC | PRN
  Administered 2021-02-21: 5 mL
  Administered 2021-02-21: 9 mL

## 2021-02-21 MED ORDER — ASPIRIN EC 81 MG PO TBEC: 81.0000 mg | DELAYED_RELEASE_TABLET | Freq: Every day | ORAL | 1 refills | Status: AC

## 2021-02-21 MED ORDER — PHENYLEPHRINE HCL (PRESSORS) 10 MG/ML IV SOLN
INTRAVENOUS | Status: AC
  Filled 2021-02-21: qty 2

## 2021-02-21 MED ORDER — EPHEDRINE SULFATE-NACL 50-0.9 MG/10ML-% IV SOSY
PREFILLED_SYRINGE | INTRAVENOUS | Status: DC | PRN
  Administered 2021-02-21: 10 mg via INTRAVENOUS

## 2021-02-21 MED ORDER — TRIAMCINOLONE ACETONIDE 40 MG/ML IJ SUSP
INTRAMUSCULAR | Status: AC
  Filled 2021-02-21: qty 1

## 2021-02-21 MED ORDER — EPINEPHRINE PF 1 MG/ML IJ SOLN
INTRAMUSCULAR | Status: AC
  Filled 2021-02-21: qty 1

## 2021-02-21 MED ORDER — PROPOFOL 10 MG/ML IV BOLUS
INTRAVENOUS | Status: AC
  Filled 2021-02-21: qty 40

## 2021-02-21 MED ORDER — ACETAMINOPHEN 500 MG PO TABS
1000.0000 mg | ORAL_TABLET | Freq: Once | ORAL | Status: AC
  Administered 2021-02-21: 1000 mg via ORAL
  Filled 2021-02-21: qty 2

## 2021-02-21 MED ORDER — DOCUSATE SODIUM 100 MG PO CAPS: 100.0000 mg | ORAL_CAPSULE | Freq: Two times a day (BID) | ORAL | 1 refills | Status: DC | PRN

## 2021-02-21 MED ORDER — BUPIVACAINE-EPINEPHRINE (PF) 0.5% -1:200000 IJ SOLN
INTRAMUSCULAR | Status: DC | PRN
  Administered 2021-02-21: 10 mL via PERINEURAL

## 2021-02-21 MED ORDER — FENTANYL CITRATE (PF) 100 MCG/2ML IJ SOLN
INTRAMUSCULAR | Status: DC | PRN
  Administered 2021-02-21: 50 ug via INTRAVENOUS

## 2021-02-21 MED ORDER — CEPHALEXIN 500 MG PO CAPS
500.0000 mg | ORAL_CAPSULE | Freq: Four times a day (QID) | ORAL | 1 refills | Status: DC
Start: 2021-02-21 — End: 2022-07-20

## 2021-02-21 MED ORDER — SUCCINYLCHOLINE CHLORIDE 200 MG/10ML IV SOSY
PREFILLED_SYRINGE | INTRAVENOUS | Status: DC | PRN
  Administered 2021-02-21: 200 mg via INTRAVENOUS

## 2021-02-21 MED ORDER — ROCURONIUM BROMIDE 10 MG/ML (PF) SYRINGE
PREFILLED_SYRINGE | INTRAVENOUS | Status: DC | PRN
  Administered 2021-02-21: 50 mg via INTRAVENOUS

## 2021-02-21 MED ORDER — FENTANYL CITRATE (PF) 100 MCG/2ML IJ SOLN
50.0000 ug | INTRAMUSCULAR | Status: DC
  Administered 2021-02-21: 100 ug via INTRAVENOUS
  Filled 2021-02-21: qty 2

## 2021-02-21 MED ORDER — MIDAZOLAM HCL 2 MG/2ML IJ SOLN
1.0000 mg | INTRAMUSCULAR | Status: DC
  Administered 2021-02-21: 2 mg via INTRAVENOUS
  Filled 2021-02-21: qty 2

## 2021-02-21 MED ORDER — ROCURONIUM BROMIDE 10 MG/ML (PF) SYRINGE
PREFILLED_SYRINGE | INTRAVENOUS | Status: AC
  Filled 2021-02-21: qty 10

## 2021-02-21 MED ORDER — LIDOCAINE 2% (20 MG/ML) 5 ML SYRINGE
INTRAMUSCULAR | Status: DC | PRN
  Administered 2021-02-21: 20 mg via INTRAVENOUS

## 2021-02-21 MED ORDER — DEXAMETHASONE SODIUM PHOSPHATE 4 MG/ML IJ SOLN
INTRAMUSCULAR | Status: DC | PRN
  Administered 2021-02-21: 2 mg via PERINEURAL

## 2021-02-21 MED ORDER — LIDOCAINE 2% (20 MG/ML) 5 ML SYRINGE
INTRAMUSCULAR | Status: AC
  Filled 2021-02-21: qty 5

## 2021-02-21 MED ORDER — PHENYLEPHRINE 40 MCG/ML (10ML) SYRINGE FOR IV PUSH (FOR BLOOD PRESSURE SUPPORT)
PREFILLED_SYRINGE | INTRAVENOUS | Status: AC
  Filled 2021-02-21: qty 10

## 2021-02-21 MED ORDER — ONDANSETRON HCL 4 MG/2ML IJ SOLN
INTRAMUSCULAR | Status: AC
  Filled 2021-02-21: qty 2

## 2021-02-21 MED ORDER — BUPIVACAINE HCL (PF) 0.5 % IJ SOLN
INTRAMUSCULAR | Status: DC | PRN
  Administered 2021-02-21: 15 mL via PERINEURAL

## 2021-02-21 MED ORDER — CLONIDINE HCL (ANALGESIA) 100 MCG/ML EP SOLN
EPIDURAL | Status: DC | PRN
  Administered 2021-02-21: 30 ug

## 2021-02-21 SURGICAL SUPPLY — 70 items
AID PSTN UNV HD RSTRNT DISP (MISCELLANEOUS)
ANCH SUT 2 19.1 W/FIBERTAPE (Anchor) ×3 IMPLANT
ANCHOR NDL 9/16 CIR SZ 8 (NEEDLE) IMPLANT
ANCHOR NEEDLE 9/16 CIR SZ 8 (NEEDLE) IMPLANT
ANCHOR SL BIO 4.75 W/FIBERTAPE (Anchor) ×6 IMPLANT
BAG COUNTER SPONGE SURGICOUNT (BAG) IMPLANT
BAG SPNG CNTER NS LX DISP (BAG)
BAG SURGICOUNT SPONGE COUNTING (BAG)
BLADE EXCALIBUR 4.0MM X 13CM (MISCELLANEOUS) ×1
BLADE EXCALIBUR 4.0X13 (MISCELLANEOUS) ×2 IMPLANT
BLADE SURG SZ11 CARB STEEL (BLADE) ×3 IMPLANT
CANNULA ACUFO 5X76 (CANNULA) ×3 IMPLANT
CLEANER TIP ELECTROSURG 2X2 (MISCELLANEOUS) IMPLANT
CLOSURE WOUND 1/2 X4 (GAUZE/BANDAGES/DRESSINGS) ×2
COVER SURGICAL LIGHT HANDLE (MISCELLANEOUS) ×3 IMPLANT
DISSECTOR  3.8MM X 13CM (MISCELLANEOUS)
DISSECTOR 3.5MM X 13CM (MISCELLANEOUS) IMPLANT
DISSECTOR 3.8MM X 13CM (MISCELLANEOUS) IMPLANT
DRAPE POUCH INSTRU U-SHP 10X18 (DRAPES) ×3 IMPLANT
DRAPE STERI 35X30 U-POUCH (DRAPES) ×3 IMPLANT
DRSG AQUACEL AG ADV 3.5X 4 (GAUZE/BANDAGES/DRESSINGS) IMPLANT
DRSG AQUACEL AG ADV 3.5X 6 (GAUZE/BANDAGES/DRESSINGS) ×2 IMPLANT
DRSG PAD ABDOMINAL 8X10 ST (GAUZE/BANDAGES/DRESSINGS) IMPLANT
DURAPREP 26ML APPLICATOR (WOUND CARE) ×3 IMPLANT
ELECT NDL TIP 2.8 STRL (NEEDLE) ×1 IMPLANT
ELECT NEEDLE TIP 2.8 STRL (NEEDLE) ×3 IMPLANT
ELECT REM PT RETURN 15FT ADLT (MISCELLANEOUS) ×3 IMPLANT
FILTER STRAW (MISCELLANEOUS) ×3 IMPLANT
GLOVE SRG 8 PF TXTR STRL LF DI (GLOVE) ×1 IMPLANT
GLOVE SURG POLYISO LF SZ7.5 (GLOVE) ×8 IMPLANT
GLOVE SURG POLYISO LF SZ8 (GLOVE) ×6 IMPLANT
GLOVE SURG UNDER POLY LF SZ7.5 (GLOVE) ×5 IMPLANT
GLOVE SURG UNDER POLY LF SZ8 (GLOVE) ×3
GOWN STRL REUS W/TWL XL LVL3 (GOWN DISPOSABLE) ×6 IMPLANT
KIT BASIN OR (CUSTOM PROCEDURE TRAY) ×3 IMPLANT
KIT TURNOVER KIT A (KITS) ×3 IMPLANT
MANIFOLD NEPTUNE II (INSTRUMENTS) ×3 IMPLANT
NDL SCORPION MULTI FIRE (NEEDLE) IMPLANT
NDL SPNL 18GX3.5 QUINCKE PK (NEEDLE) ×1 IMPLANT
NEEDLE SCORPION MULTI FIRE (NEEDLE) ×3 IMPLANT
NEEDLE SPNL 18GX3.5 QUINCKE PK (NEEDLE) ×3 IMPLANT
PACK SHOULDER (CUSTOM PROCEDURE TRAY) ×3 IMPLANT
PENCIL SMOKE EVACUATOR (MISCELLANEOUS) IMPLANT
PORT APPOLLO RF 90DEGREE MULTI (SURGICAL WAND) IMPLANT
PROTECTOR NERVE ULNAR (MISCELLANEOUS) ×3 IMPLANT
RESTRAINT HEAD UNIVERSAL NS (MISCELLANEOUS) IMPLANT
SLING ARM IMMOBILIZER LRG (SOFTGOODS) ×2 IMPLANT
SLING ARM IMMOBILIZER MED (SOFTGOODS) IMPLANT
SLING ULTRA II L (ORTHOPEDIC SUPPLIES) IMPLANT
STRIP CLOSURE SKIN 1/2X4 (GAUZE/BANDAGES/DRESSINGS) ×2 IMPLANT
SUCTION FRAZIER HANDLE 12FR (TUBING) ×3
SUCTION TUBE FRAZIER 12FR DISP (TUBING) ×1 IMPLANT
SUT ETHIBOND NAB CT1 #1 30IN (SUTURE) IMPLANT
SUT ETHILON 4 0 PS 2 18 (SUTURE) ×3 IMPLANT
SUT FIBERWIRE #2 38 T-5 BLUE (SUTURE)
SUT PROLENE 3 0 PS 2 (SUTURE) ×3 IMPLANT
SUT TIGER TAPE 7 IN WHITE (SUTURE) IMPLANT
SUT VIC AB 0 CT1 27 (SUTURE) ×3
SUT VIC AB 0 CT1 27XBRD ANTBC (SUTURE) ×1 IMPLANT
SUT VIC AB 1-0 CT2 27 (SUTURE) IMPLANT
SUT VIC AB 2-0 CT2 27 (SUTURE) IMPLANT
SUT VICRYL 0 UR6 27IN ABS (SUTURE) ×3 IMPLANT
SUTURE FIBERWR #2 38 T-5 BLUE (SUTURE) IMPLANT
SYR 20ML LL LF (SYRINGE) ×3 IMPLANT
TAPE FIBER 2MM 7IN #2 BLUE (SUTURE) IMPLANT
TOWEL OR 17X26 10 PK STRL BLUE (TOWEL DISPOSABLE) ×3 IMPLANT
TUBING ARTHROSCOPY IRRIG 16FT (MISCELLANEOUS) ×3 IMPLANT
TUBING CONNECTING 10 (TUBING) ×4 IMPLANT
TUBING CONNECTING 10' (TUBING) ×2
WIPE CHG CHLORHEXIDINE 2% (PERSONAL CARE ITEMS) ×3 IMPLANT

## 2021-02-21 NOTE — Interval H&P Note (Signed)
History and Physical Interval Note:  02/21/2021 12:30 PM  [name]  has presented today for surgery, with the diagnosis of Left shoulder rotator cuff tear, impingement.  The various methods of treatment have been discussed with the patient and family. After consideration of risks, benefits and other options for treatment, the patient has consented to  Procedure(s): SHOULDER ARTHROSCOPY WITH  MINI OPEN  ROTATOR CUFF REPAIR AND SUBACROMIAL DECOMPRESSION, POSSIBLE PATCH GRAFT (Left) as a surgical intervention.  The patient's history has been reviewed, patient examined, no change in status, stable for surgery.  I have reviewed the patient's chart and labs.  Questions were answered to the patient's satisfaction.     William Hebert

## 2021-02-21 NOTE — Progress Notes (Signed)
Assisted Dr Beth Finucane with left, ultrasound guided, interscalene  block. Side rails up, monitors on throughout procedure. See vital signs in flow sheet. Tolerated Procedure well.  

## 2021-02-21 NOTE — Brief Op Note (Signed)
02/21/2021  2:11 PM  PATIENT:  William Hebert  75 y.o. male  PRE-OPERATIVE DIAGNOSIS:  Left shoulder rotator cuff tear, impingement  POST-OPERATIVE DIAGNOSIS:  Left shoulder rotator cuff tear, impingement  PROCEDURE:  Procedure(s): MINI OPEN  ROTATOR CUFF REPAIR AND SUBACROMIAL DECOMPRESSION (Left)  SURGEON:  Surgeon(s) and Role:    * Susa Day, MD - Primary  PHYSICIAN ASSISTANT:   ASSISTANTS: bISSELL   ANESTHESIA:   general  EBL:  MIN   BLOOD ADMINISTERED:none  DRAINS: none   LOCAL MEDICATIONS USED:  MARCAINE     SPECIMEN:  No Specimen  DISPOSITION OF SPECIMEN:  N/A  COUNTS:  YES  TOURNIQUET:  * No tourniquets in log *  DICTATION: .Other Dictation: Dictation Number   [id number]  PLAN OF CARE: Discharge to home after PACU  PATIENT DISPOSITION:  PACU - hemodynamically stable.   Delay start of Pharmacological VTE agent (>24hrs) due to surgical blood loss or risk of bleeding: no

## 2021-02-21 NOTE — Anesthesia Preprocedure Evaluation (Addendum)
Anesthesia Evaluation  Patient identified by MRN, date of birth, ID band Patient awake    Reviewed: Allergy & Precautions, NPO status , Patient's Chart, lab work & pertinent test results, reviewed documented beta blocker date and time   History of Anesthesia Complications Negative for: history of anesthetic complications  Airway Mallampati: III  TM Distance: >3 FB Neck ROM: Full    Dental no notable dental hx. (+) Teeth Intact, Dental Advisory Given   Pulmonary shortness of breath and with exertion, sleep apnea and Continuous Positive Airway Pressure Ventilation , COPD (no inhalers per pt), former smoker,  Quit smoking 1973, 77 pack year history    Pulmonary exam normal breath sounds clear to auscultation       Cardiovascular hypertension, Pt. on medications and Pt. 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   Echo 2019:  - Left ventricle: The cavity size was at the upper limits of  normal. Wall thickness was at the upper limits of normal.  Systolic function was normal. The estimated ejection fraction was  in the range of 60% to 65%. Left ventricular diastolic function  parameters were normal for the patient&'s age.  - Aortic root: The aortic root was mildly dilated.  - Left atrium: The atrium was mildly dilated.  - Right ventricle: The cavity size was normal. Systolic function  was normal.    Neuro/Psych  Headaches, negative psych ROS   GI/Hepatic Neg liver ROS, GERD  Controlled and Medicated,  Endo/Other  diabetes, Well Controlled, Type 2, Oral Hypoglycemic AgentsMorbid obesityBMI 28 a1c 6.7  Renal/GU Renal Insufficiency and CRFRenal diseaseCr 1.67 (May 2022 1.5)  negative genitourinary   Musculoskeletal  (+) Arthritis  , Osteoarthritis,  Left shoulder rotator cuff tear, impingement    Abdominal (+) + obese,   Peds  Hematology negative hematology ROS (+)   Anesthesia Other Findings   Reproductive/Obstetrics negative OB ROS                          Anesthesia Physical Anesthesia Plan  ASA: 3  Anesthesia Plan: General and Regional   Post-op Pain Management: GA combined w/ Regional for post-op pain   Induction: Intravenous  PONV Risk Score and Plan: 2 and Ondansetron, Dexamethasone and Treatment may vary due to age or medical condition  Airway Management Planned: Oral ETT  Additional Equipment: None  Intra-op Plan:   Post-operative Plan: Extubation in OR  Informed Consent: I have reviewed the patients History and Physical, chart, labs and discussed the procedure including the risks, benefits and alternatives for the proposed anesthesia with the patient or authorized representative who has indicated his/her understanding and acceptance.     Dental advisory given  Plan Discussed with: CRNA  Anesthesia Plan Comments: (Other shoulder done in 2018 no issues- intubation: Mask ventilation without difficulty, Mac 4, Grade II view)     Anesthesia Quick Evaluation

## 2021-02-21 NOTE — Op Note (Signed)
NAME: William Hebert, [place] MEDICAL RECORD NO: [id number] ACCOUNT NO: [id number] DATE OF BIRTH: 02/09/1946 FACILITY: [facility] LOCATION: [facility] PHYSICIAN: Johnn Hai, MD  Operative Report   DATE OF PROCEDURE: 02/21/2021  PREOPERATIVE DIAGNOSES:  Rotator cuff tear, impingement syndrome of the left shoulder.  Elevated body mass index of 46.  POSTOPERATIVE DIAGNOSES:  Rotator cuff tear, impingement syndrome of the left shoulder.  Elevated body mass index of 46.  PROCEDURE PERFORMED:  1.  Mini open rotator cuff repair of the left shoulder. 2.  Subacromial decompression. 3.  Acromioplasty.  Technical difficulty increased due to the patient's elevated BMI.  HISTORY:  A 75 year old with a rotator cuff tear of the left shoulder, indicated for repair.  Risks and benefits discussed including bleeding, infection, damage to neurovascular structures, no change in symptoms, worsening symptoms, DVT, PE, anesthetic  complications, etc.  TECHNIQUE:  The patient in supine beach chair position, after the induction of adequate general anesthesia, 3 grams of Kefzol given, the left shoulder, precordial region and the upper extremity was prepped and draped in the usual sterile fashion.  A  surgical marker was utilized to delineate the acromion, AC joint and coracoid.  A 3 cm incision was made over the anterior lateral aspect of the acromion after infiltration with Marcaine with epinephrine.  There was a minimal raphe between the anterior  and lateral heads of the deltoid.  They were divided using the deep tongue retractor to gain access to the subacromial space.  This was opened.  Hypertrophic bursa was noted throughout.  We performed a full bursectomy.  Small spur off the anterolateral  aspect of the acromion was removed with 3 mm Kerrison.  I digitally lysed subacromial adhesions.  Noted lateral to the bicipital groove within the substance of the supraspinatus, a fairly attenuated portion, essentially full  thickness tear.  It was  hyperemic.  This portion was excised.  It measured approximately 2 x 0.5 cm.  I then decorticated the greater tuberosity producing a trough for delivery of the rotator cuff.  The cuff was mobilized on its articular and bursal surfaces utilizing a Cobb  and an Allis and fully mobilizing this.  Following this, I placed one suture anchor just lateral to the articular surface, preceded by an awl fashioning a pilot hole.  I then used a TigerTape, threaded through a SwiveLock, inserted it into this pilot  hole.  Excellent resistance to pullout was noted.  I then used a Scorpion suture passer to pass three leaflets through the cuff, one anteriorly, one posteriorly and one medially.  These were crossed and placed in a second row of SwiveLocks over the  greater tuberosity after fashioning two separate pilot holes and inserting them to SwiveLocks with the arm at his side.  This pulled the rotator cuff together with good coverage.  Redundant suture was then removed.  Full coverage was noted.  The  remainder of the cuff was examined and was unremarkable.  I copiously irrigated the wound.  No active bleeding was noted.  We infiltrated with 0.25% Marcaine with epinephrine.  I then removed our retractor.  Repaired the raphe with 0 Vicryl interrupted  figure-of-eight sutures, subcutaneous with multiple 2-0 and skin with Prolene through the patient's subcutaneous tissue.  A sterile dressing applied, placed in a sling, extubated without difficulty and transported to the recovery room in satisfactory  condition.  The patient tolerated the procedure well.  There were no complications.  Assistant, Lacie Draft, [place], was used throughout the case  for positioning of the extremity, insertion of the SwiveLocks and closure.  Minimal blood loss.   SHW D: 02/21/2021 2:17:58 pm T: 02/21/2021 10:59:00 pm  JOB: [id number]/ [id number]

## 2021-02-21 NOTE — Transfer of Care (Signed)
Immediate Anesthesia Transfer of Care Note  Patient: William Hebert  Procedure(s) Performed: MINI OPEN  ROTATOR CUFF REPAIR AND SUBACROMIAL DECOMPRESSION (Left)  Patient Location: PACU  Anesthesia Type:GA combined with regional for post-op pain  Level of Consciousness: drowsy and patient cooperative  Airway & Oxygen Therapy: Patient Spontanous Breathing and Patient connected to face mask oxygen  Post-op Assessment: Report given to RN and Post -op Vital signs reviewed and stable  Post vital signs: Reviewed and stable  Last Vitals:  Vitals Value Taken Time  BP 128/101 02/21/21 1433  Temp    Pulse 70 02/21/21 1437  Resp 20 02/21/21 1437  SpO2 98 % 02/21/21 1437  Vitals shown include unvalidated device data.  Last Pain:  Vitals:   02/21/21 1046  TempSrc: Oral  PainSc: 5       Patients Stated Pain Goal: 4 (96/78/93 8101)  Complications: No notable events documented.

## 2021-02-21 NOTE — Discharge Instructions (Signed)

## 2021-02-21 NOTE — Anesthesia Procedure Notes (Addendum)
Anesthesia Regional Block: Interscalene brachial plexus block   Pre-Anesthetic Checklist: , timeout performed,  Correct Patient, Correct Site, Correct Laterality,  Correct Procedure, Correct Position, site marked,  Risks and benefits discussed,  Surgical consent,  Pre-op evaluation,  At surgeon's request and post-op pain management  Laterality: Left  Prep: Maximum Sterile Barrier Precautions used, chloraprep       Needles:  Injection technique: Single-shot  Needle Type: Echogenic Stimulator Needle     Needle Length: 9cm  Needle Gauge: 22     Additional Needles:   Procedures:,,,, ultrasound used (permanent image in chart),,    Narrative:  Start time: 02/21/2021 11:20 AM End time: 02/21/2021 11:30 AM Injection made incrementally with aspirations every 5 mL.  Performed by: Personally  Anesthesiologist: Pervis Hocking, DO  Additional Notes: Monitors applied. No increased pain on injection. No increased resistance to injection. Injection made in 5cc increments. Good needle visualization. Patient tolerated procedure well.   Katharine Rochefort edit: Reassessed prior to OR, pt with full motor. Block supplemented with Dr. Doroteo Glassman assisting.

## 2021-02-21 NOTE — Anesthesia Procedure Notes (Signed)
Procedure Name: Intubation Date/Time: 02/21/2021 12:54 PM Performed by: Victoriano Lain, CRNA Pre-anesthesia Checklist: Patient identified, Emergency Drugs available, Suction available, Patient being monitored and Timeout performed Patient Re-evaluated:Patient Re-evaluated prior to induction Oxygen Delivery Method: Circle system utilized Preoxygenation: Pre-oxygenation with 100% oxygen Induction Type: IV induction Ventilation: Oral airway inserted - appropriate to patient size and Mask ventilation without difficulty Laryngoscope Size: Mac and 4 Grade View: Grade I Tube type: Oral Tube size: 7.5 mm Number of attempts: 1 Airway Equipment and Method: Stylet Placement Confirmation: ETT inserted through vocal cords under direct vision, positive ETCO2 and breath sounds checked- equal and bilateral Secured at: 22 cm Tube secured with: Tape Dental Injury: Teeth and Oropharynx as per pre-operative assessment

## 2021-02-23 ENCOUNTER — Encounter (HOSPITAL_COMMUNITY): Payer: Self-pay | Admitting: Specialist

## 2021-02-24 ENCOUNTER — Telehealth: Payer: Medicare Other

## 2021-02-24 NOTE — Progress Notes (Signed)
Debbora Dus, CPP notified  Avel Sensor, [place] Assistant [phone]

## 2021-02-24 NOTE — Anesthesia Postprocedure Evaluation (Signed)
Anesthesia Post Note  Patient: William Hebert  Procedure(s) Performed: MINI OPEN  ROTATOR CUFF REPAIR AND SUBACROMIAL DECOMPRESSION (Left)     Patient location during evaluation: PACU Anesthesia Type: Regional and General Level of consciousness: sedated and patient cooperative Pain management: pain level controlled Vital Signs Assessment: post-procedure vital signs reviewed and stable Respiratory status: spontaneous breathing Cardiovascular status: stable Anesthetic complications: no   No notable events documented.  Last Vitals:  Vitals:   02/21/21 1445 02/21/21 1530  BP: 135/72   Pulse: 77   Resp: 19 18  Temp:    SpO2: 100%     Last Pain:  Vitals:   02/21/21 1530  TempSrc:   PainSc: 0-No pain                 Nolon Nations

## 2021-03-26 ENCOUNTER — Telehealth: Payer: Self-pay

## 2021-03-26 NOTE — Chronic Care Management (AMB) (Addendum)
Chronic Care Management Pharmacy Assistant   Name: William Hebert  MRN: [id number] DOB: 01-Apr-1946  Reason for Encounter: Disease State - HTN  Recent office visits:  01/08/21 - Dr.Gutierrez, PCP - Pt presented for pre-op visit. Continue current medications   Recent consult visits:  None since last CCM visit  Hospital visits:  02/21/21 - [facility], Orthopedic Surgery Left Shoulder  Medications: Outpatient Encounter Medications as of 03/26/2021  Medication Sig   Accu-Chek FastClix Lancets MISC Use as instructed to check blood sugar once daily.   acetaminophen (TYLENOL) 500 MG tablet Take 1,000 mg by mouth every 6 (six) hours as needed for moderate pain or mild pain.   albuterol (PROVENTIL HFA;VENTOLIN HFA) 108 (90 Base) MCG/ACT inhaler Inhale 1-2 puffs into the lungs every 4 (four) hours as needed for wheezing or shortness of breath.   Ascorbic Acid (VITAMIN C PO) Take 1,000 mg by mouth daily. Super C   aspirin EC 81 MG tablet Take 1 tablet (81 mg total) by mouth daily. Day after surgery   B Complex-C (SUPER B COMPLEX PO) Take 1 tablet by mouth daily.   Blood Glucose Monitoring Suppl (ACCU-CHEK GUIDE) w/Device KIT Use as instructed to check blood sugar once daily.   cephALEXin (KEFLEX) 500 MG capsule Take 1 capsule (500 mg total) by mouth 4 (four) times daily. Starting 6 hours after scheduled surgery time   D3-50 1.25 MG (50000 UT) capsule TAKE 50,000 UNITS (1 CAPSULE) BY MOUTH EVERY FRIDAY. IN THE MORNING.   docusate sodium (COLACE) 100 MG capsule Take 1 capsule (100 mg total) by mouth 2 (two) times daily as needed for mild constipation.   furosemide (LASIX) 40 MG tablet Take 40 mg by mouth daily.   glucose blood (ACCU-CHEK GUIDE) test strip Use as instructed to check blood sugar once daily.   glucose blood (ACCU-CHEK GUIDE) test strip Use as instructed   labetalol (NORMODYNE) 200 MG tablet Take 1 tablet (200 mg total) by mouth daily.   metFORMIN (GLUCOPHAGE) 500 MG tablet  Take 1 tablet (500 mg total) by mouth daily with breakfast.   Multiple Vitamins-Minerals (MULTIVITAMIN ADULT PO) Take 1 tablet by mouth daily. MEN'S 50+ MULTIVITAMIN.   NIFEdipine (PROCARDIA XL/NIFEDICAL XL) 60 MG 24 hr tablet Take 1 tablet (60 mg total) by mouth daily.   omeprazole (PRILOSEC) 40 MG capsule Take 1 capsule (40 mg total) by mouth daily.   polyethylene glycol (MIRALAX / GLYCOLAX) 17 g packet Take 17 g by mouth daily.   potassium chloride SA (KLOR-CON) 20 MEQ tablet Take 1 tablet (20 mEq total) by mouth daily.   valsartan (DIOVAN) 80 MG tablet Take 1 tablet (80 mg total) by mouth daily.   Zinc 100 MG TABS Take 1 tablet (100 mg total) by mouth daily.   No facility-administered encounter medications on file as of 03/26/2021.    Recent Office Vitals: BP Readings from Last 3 Encounters:  02/21/21 135/72  01/08/21 138/72  07/01/20 130/82   Pulse Readings from Last 3 Encounters:  02/21/21 77  01/08/21 66  07/01/20 67    Wt Readings from Last 3 Encounters:  02/21/21 (!) 325 lb (147.4 kg)  01/08/21 (!) 325 lb 9 oz (147.7 kg)  07/24/20 (!) 312 lb (141.5 kg)     Kidney Function Lab Results  Component Value Date/Time   CREATININE 1.67 (H) 02/21/2021 10:36 AM   CREATININE 1.36 01/08/2021 04:18 PM   CREATININE 1.37 (H) 06/03/2018 03:47 PM   CREATININE 1.41 (H) 02/25/2018  03:47 PM   GFR 51.72 (L) 01/08/2021 04:18 PM   GFRNONAA 43 (L) 02/21/2021 10:36 AM   GFRNONAA 72 09/10/2015 02:46 PM   GFRAA >60 07/06/2017 02:42 PM   GFRAA 84 09/10/2015 02:46 PM    BMP Latest Ref Rng & Units 02/21/2021 01/08/2021 07/24/2020  Glucose 70 - 99 mg/dL 129(H) 116(H) 112(H)  BUN 8 - 23 mg/dL $Remove'23 22 15  'OKUxcHa$ Creatinine 0.61 - 1.24 mg/dL 1.67(H) 1.36 1.27  BUN/Creat Ratio 6 - 22 (calc) - - -  Sodium 135 - 145 mmol/L 142 140 140  Potassium 3.5 - 5.1 mmol/L 3.6 3.6 3.5  Chloride 98 - 111 mmol/L 107 104 103  CO2 22 - 32 mmol/L $RemoveB'26 28 29  'LiRBZNNg$ Calcium 8.9 - 10.3 mg/dL 9.3 9.2 9.1     Contacted patient  on 03/26/21 to discuss hypertension disease state  Current antihypertensive regimen:  Labetalol 200 mg - 1 daily in the morning  Nifedipine 60 mg 24 hour - 1 daily in the morning  Valsartan 80 mg - 1 daily in the evening Lasix $RemoveBefor'40mg'jyyKsAlTPIZf$  -take 1 tablet daily   Patient verbally confirms he is taking the above medications as directed. Yes spoke with the patient and he confirmed these medications and dose  How often are you checking your Blood Pressure? when feeling symptomatic - The patient reports he has not checked his BP recently due to recent surgery. Wrist or arm cuff: he reports he has arm cuff Salt intake: limits adding to food OTC medications including pseudoephedrine or NSAIDs? no  What recent interventions/DTPs have been made by any provider to improve Blood Pressure control since last CPP Visit: none identified at this time   Any recent hospitalizations or ED visits since last visit with CPP? Yes 02/21/21 - [place] surgery Left shoulder   What diet changes have been made to improve Blood Pressure Control? Limits salt intake  What exercise is being done to improve your Blood Pressure Control? None at this time   Adherence Review: Is the patient currently on ACE/ARB medication? Yes Does the patient have >5 day gap between last estimated fill dates? No  Star Rating Drugs:  Medication:  Last Fill: Day Supply Valsartan $RemoveBeforeDE'80mg'JiioHMfxSGIHqzG$  03/08/21  90 Metformin $RemoveBefo'500mg'tzPUaOIJbbF$  03/08/21  90  Last annual wellness visit: 06/26/2020  No appointments scheduled within the next 30 days.  Debbora Dus, CPP notified  Avel Sensor, [place] Assistant [phone]  I have reviewed the care management and care coordination activities outlined in this encounter and I am certifying that I agree with the content of this note. No further action required.  Debbora Dus, PharmD Clinical Pharmacist [facility] [phone]

## 2021-04-28 ENCOUNTER — Other Ambulatory Visit: Payer: Self-pay | Admitting: Family Medicine

## 2021-04-29 ENCOUNTER — Telehealth: Payer: Self-pay

## 2021-04-29 NOTE — Chronic Care Management (AMB) (Addendum)
Chronic Care Management Pharmacy Assistant   Name: William Hebert  MRN: [id number] DOB: 1946-03-26  Reason for Encounter: HTN Review  Recent office visits:  None since last CCM contact  Recent consult visits:  None since last CCM contact  Hospital visits:  02/21/21 - [place] presented for outpatient orthopedic left shoulder surgery.  Medications: Outpatient Encounter Medications as of 04/29/2021  Medication Sig   Accu-Chek FastClix Lancets MISC Use as instructed to check blood sugar once daily.   acetaminophen (TYLENOL) 500 MG tablet Take 1,000 mg by mouth every 6 (six) hours as needed for moderate pain or mild pain.   albuterol (PROVENTIL HFA;VENTOLIN HFA) 108 (90 Base) MCG/ACT inhaler Inhale 1-2 puffs into the lungs every 4 (four) hours as needed for wheezing or shortness of breath.   Ascorbic Acid (VITAMIN C PO) Take 1,000 mg by mouth daily. Super C   aspirin EC 81 MG tablet Take 1 tablet (81 mg total) by mouth daily. Day after surgery   B Complex-C (SUPER B COMPLEX PO) Take 1 tablet by mouth daily.   Blood Glucose Monitoring Suppl (ACCU-CHEK GUIDE) w/Device KIT Use as instructed to check blood sugar once daily.   cephALEXin (KEFLEX) 500 MG capsule Take 1 capsule (500 mg total) by mouth 4 (four) times daily. Starting 6 hours after scheduled surgery time   D3-50 1.25 MG (50000 UT) capsule TAKE 50,000 UNITS (1 CAPSULE) BY MOUTH EVERY FRIDAY. IN THE MORNING.   docusate sodium (COLACE) 100 MG capsule Take 1 capsule (100 mg total) by mouth 2 (two) times daily as needed for mild constipation.   furosemide (LASIX) 40 MG tablet Take 40 mg by mouth daily.   glucose blood (ACCU-CHEK GUIDE) test strip Use as instructed to check blood sugar once daily.   glucose blood (ACCU-CHEK GUIDE) test strip Use as instructed   labetalol (NORMODYNE) 200 MG tablet Take 1 tablet (200 mg total) by mouth daily.   metFORMIN (GLUCOPHAGE) 500 MG tablet Take 1 tablet (500 mg total) by mouth  daily with breakfast.   Multiple Vitamins-Minerals (MULTIVITAMIN ADULT PO) Take 1 tablet by mouth daily. MEN'S 50+ MULTIVITAMIN.   NIFEdipine (PROCARDIA XL/NIFEDICAL XL) 60 MG 24 hr tablet Take 1 tablet (60 mg total) by mouth daily.   omeprazole (PRILOSEC) 40 MG capsule Take 1 capsule (40 mg total) by mouth daily.   polyethylene glycol (MIRALAX / GLYCOLAX) 17 g packet Take 17 g by mouth daily.   potassium chloride SA (KLOR-CON) 20 MEQ tablet Take 1 tablet (20 mEq total) by mouth daily.   valsartan (DIOVAN) 80 MG tablet Take 1 tablet (80 mg total) by mouth daily.   Zinc 100 MG TABS Take 1 tablet (100 mg total) by mouth daily.   No facility-administered encounter medications on file as of 04/29/2021.     Recent Office Vitals: BP Readings from Last 3 Encounters:  02/21/21 135/72  01/08/21 138/72  07/01/20 130/82   Pulse Readings from Last 3 Encounters:  02/21/21 77  01/08/21 66  07/01/20 67    Wt Readings from Last 3 Encounters:  02/21/21 (!) 325 lb (147.4 kg)  01/08/21 (!) 325 lb 9 oz (147.7 kg)  07/24/20 (!) 312 lb (141.5 kg)     Kidney Function Lab Results  Component Value Date/Time   CREATININE 1.67 (H) 02/21/2021 10:36 AM   CREATININE 1.36 01/08/2021 04:18 PM   CREATININE 1.37 (H) 06/03/2018 03:47 PM   CREATININE 1.41 (H) 02/25/2018 03:47 PM   GFR  51.72 (L) 01/08/2021 04:18 PM   GFRNONAA 43 (L) 02/21/2021 10:36 AM   GFRNONAA 72 09/10/2015 02:46 PM   GFRAA >60 07/06/2017 02:42 PM   GFRAA 84 09/10/2015 02:46 PM    BMP Latest Ref Rng & Units 02/21/2021 01/08/2021 07/24/2020  Glucose 70 - 99 mg/dL 129(H) 116(H) 112(H)  BUN 8 - 23 mg/dL _0 Creatinine 0.61 - 1.24 mg/dL 1.67(H) 1.36 1.27  BUN/Creat Ratio 6 - 22 (calc) - - -  Sodium 135 - 145 mmol/L 142 140 140  Potassium 3.5 - 5.1 mmol/L 3.6 3.6 3.5  Chloride 98 - 111 mmol/L 107 104 103  CO2 22 - 32 mmol/L _1 Calcium 8.9 - 10.3 mg/dL 9.3 9.2 9.1     Contacted patient on 04/29/21 to discuss hypertension  disease state  Current antihypertensive regimen:  Labetalol 200 mg - 1 daily in the morning  Nifedipine 60 mg 24 hour - 1 daily in the morning  Valsartan 80 mg - 1 daily in the evening Lasix 68m -take 1 tablet daily    Patient verbally confirms he is taking the above medications as directed. Yes  How often are you checking your Blood Pressure? weekly  Current home BP readings: Home Health came in this morning  DATE:             BP               PULSE 04/29/21  120/70  66  Wrist or arm cuff: Home Health used arm cuff Salt intake: Limits with adding to food OTC medications including pseudoephedrine or NSAIDs?no  Any readings above 180/120? No  What recent interventions/DTPs have been made by any provider to improve Blood Pressure control since last CPP Visit: MSharyn Lullrecommended home BP monitoring  Any recent hospitalizations or ED visits since last visit with CPP? No  What diet changes have been made to improve Blood Pressure Control?    None identified  What exercise is being done to improve your Blood Pressure Control?    None identified  Adherence Review: Is the patient currently on ACE/ARB medication? Yes Does the patient have >5 day gap between last estimated fill dates? No   Star Rating Drugs:  Medication:  Last Fill: Day Supply Valsartan 80 mg  03/08/21  90 Rosuvastatin 20 mg  03/08/21  90 Metformin 500 mg  03/08/21  90   No appointments scheduled within the next 30 days.  MDebbora Dus CPP notified  VAvel Sensor CBluejacketAssistant [phone] I have reviewed the care management and care coordination activities outlined in this encounter and I am certifying that I agree with the content of this note. No further action required.  MDebbora Dus PharmD Clinical Pharmacist [phone]

## 2021-05-07 ENCOUNTER — Ambulatory Visit: Payer: Medicare Other | Admitting: Cardiology

## 2021-05-09 ENCOUNTER — Other Ambulatory Visit: Payer: Self-pay | Admitting: Family Medicine

## 2021-05-09 ENCOUNTER — Other Ambulatory Visit: Payer: Self-pay | Admitting: Cardiology

## 2021-05-27 ENCOUNTER — Encounter: Payer: Self-pay | Admitting: Family Medicine

## 2021-05-31 NOTE — Telephone Encounter (Signed)
Replied via wife's mychart.

## 2021-06-16 ENCOUNTER — Telehealth: Payer: Self-pay

## 2021-06-16 NOTE — Progress Notes (Signed)
Chronic Care Management Pharmacy Assistant   Name: William Hebert  MRN: [id number] DOB: 04/03/1946  Reason for Encounter: CCM (Appointment Reminder)   Medications: Outpatient Encounter Medications as of 06/16/2021  Medication Sig   Accu-Chek FastClix Lancets MISC Use as instructed to check blood sugar once daily.   acetaminophen (TYLENOL) 500 MG tablet Take 1,000 mg by mouth every 6 (six) hours as needed for moderate pain or mild pain.   albuterol (PROVENTIL HFA;VENTOLIN HFA) 108 (90 Base) MCG/ACT inhaler Inhale 1-2 puffs into the lungs every 4 (four) hours as needed for wheezing or shortness of breath.   Ascorbic Acid (VITAMIN C PO) Take 1,000 mg by mouth daily. Super C   aspirin EC 81 MG tablet Take 1 tablet (81 mg total) by mouth daily. Day after surgery   B Complex-C (SUPER B COMPLEX PO) Take 1 tablet by mouth daily.   Blood Glucose Monitoring Suppl (ACCU-CHEK GUIDE) w/Device KIT Use as instructed to check blood sugar once daily.   cephALEXin (KEFLEX) 500 MG capsule Take 1 capsule (500 mg total) by mouth 4 (four) times daily. Starting 6 hours after scheduled surgery time   Cholecalciferol (VITAMIN D3) 1.25 MG (50000 UT) CAPS TAKE 1 CAPSULE BY MOUTH EVERY FRIDAY IN THE MORNING   docusate sodium (COLACE) 100 MG capsule Take 1 capsule (100 mg total) by mouth 2 (two) times daily as needed for mild constipation.   furosemide (LASIX) 40 MG tablet Take 40 mg by mouth daily.   glucose blood (ACCU-CHEK GUIDE) test strip Use as instructed to check blood sugar once daily.   glucose blood (ACCU-CHEK GUIDE) test strip Use as instructed   labetalol (NORMODYNE) 200 MG tablet Take 1 tablet (200 mg total) by mouth daily.   metFORMIN (GLUCOPHAGE) 500 MG tablet TAKE 1 TABLET BY MOUTH  DAILY WITH BREAKFAST   Multiple Vitamins-Minerals (MULTIVITAMIN ADULT PO) Take 1 tablet by mouth daily. MEN'S 50+ MULTIVITAMIN.   NIFEdipine (PROCARDIA XL/NIFEDICAL XL) 60 MG 24 hr tablet Take 1 tablet (60 mg total) by  mouth daily.   omeprazole (PRILOSEC) 40 MG capsule Take 1 capsule (40 mg total) by mouth daily.   polyethylene glycol (MIRALAX / GLYCOLAX) 17 g packet Take 17 g by mouth daily.   potassium chloride SA (KLOR-CON) 20 MEQ tablet Take 1 tablet (20 mEq total) by mouth daily.   valsartan (DIOVAN) 80 MG tablet Take 1 tablet (80 mg total) by mouth daily.   Zinc 100 MG TABS Take 1 tablet (100 mg total) by mouth daily.   No facility-administered encounter medications on file as of 06/16/2021.   Voicemail was left for Donielle S [place] to remind him of his upcoming telephone visit with Michelle Adams on 06/23/2021 at 2:30 PM. Patient was reminded to have all medications, supplements and any blood glucose and blood pressure readings available for review at appointment.  Star Rating Drugs: Medication:  Last Fill: Day Supply Metformin 500mg 05/17/2021 90  Valsartan 80mg 03/08/2021 90 Rosuvastatin 20mg 03/08/2021 90  Michelle Adams, CPP notified  Amy Moss, RMA Clinical Pharmacy Assistant [phone]  Time Spent: 10 Minutes       

## 2021-06-19 ENCOUNTER — Encounter: Payer: Self-pay | Admitting: Family Medicine

## 2021-06-23 ENCOUNTER — Telehealth: Payer: Self-pay

## 2021-06-23 ENCOUNTER — Ambulatory Visit (INDEPENDENT_AMBULATORY_CARE_PROVIDER_SITE_OTHER): Payer: Medicare Other

## 2021-06-23 ENCOUNTER — Other Ambulatory Visit: Payer: Self-pay

## 2021-06-23 DIAGNOSIS — I6523 Occlusion and stenosis of bilateral carotid arteries: Secondary | ICD-10-CM

## 2021-06-23 DIAGNOSIS — I10 Essential (primary) hypertension: Secondary | ICD-10-CM

## 2021-06-23 DIAGNOSIS — I4510 Unspecified right bundle-branch block: Secondary | ICD-10-CM

## 2021-06-23 DIAGNOSIS — E118 Type 2 diabetes mellitus with unspecified complications: Secondary | ICD-10-CM

## 2021-06-23 DIAGNOSIS — I890 Lymphedema, not elsewhere classified: Secondary | ICD-10-CM

## 2021-06-23 NOTE — Progress Notes (Signed)
Chronic Care Management Pharmacy Note  06/23/2021 Name:  William Hebert MRN:  [id number] DOB:  Jul 03, 1946   Summary: Overall, patient's chronic conditions are well controlled (HTN, DM, COPD, and HLD). His A1c with [employer] improved to 5.8%, BP 130/80 at home, LDL 48. His primary health concern is foot pain. Reports ongoing for the past year. He describes pain as aching, not burning. Worse when standing. He takes Tylenol and Advil. He will schedule follow up with PCP in Nov for annual physical.  Recommendations:  Discussed increasing exercise as able - 1/4 mile 2-3 days per week; Schedule annual diabetic eye and foot exam  Plan: CCM 12 months  Subjective: William Hebert is an 75 y.o. year old male who is a primary patient of Ria Bush, MD.  The CCM team was consulted for assistance with disease management and care coordination needs.    Engaged with patient by telephone for follow up visit in response to provider referral for pharmacy case management and/or care coordination services.    Consent to Services:  The patient was given information about Chronic Care Management services, agreed to services, and gave verbal consent prior to initiation of services.  Please see initial visit note for detailed documentation.   Patient Care Team: Ria Bush, MD as PCP - General (Family Medicine) Adrian Prows, MD (Cardiology) Debbora Dus, [facility] as Pharmacist (Pharmacist) New cardiologist - Dr. Pauline Aus [facility])  Phone: [phone] Fax: [phone]  Recent office visits: None since last CCM contact   Recent consult visits:  None since last CCM contact   Hospital visits: 02/21/21 - [place] presented for outpatient orthopedic left shoulder surgery.   Objective:  Lab Results  Component Value Date   CREATININE 1.67 (H) 02/21/2021   BUN 23 02/21/2021   GFR 51.72 (L) 01/08/2021   GFRNONAA 43 (L) 02/21/2021   GFRAA >60 07/06/2017   NA 142 02/21/2021   K 3.6  02/21/2021   CALCIUM 9.3 02/21/2021   CO2 26 02/21/2021   GLUCOSE 129 (H) 02/21/2021    Lab Results  Component Value Date/Time   HGBA1C 6.7 (H) 01/08/2021 04:18 PM   HGBA1C 6.4 (A) 07/01/2020 03:26 PM   HGBA1C 12.5 (A) 03/13/2020 04:00 PM   HGBA1C 7.1 (H) 04/03/2019 01:40 PM   FRUCTOSAMINE 211 04/24/2020 03:50 PM   GFR 51.72 (L) 01/08/2021 04:18 PM   GFR 56.33 (L) 07/24/2020 09:45 AM    Last diabetic Eye exam:  Lab Results  Component Value Date/Time   HMDIABEYEEXA No Retinopathy 06/10/2020 12:00 AM    Last diabetic Foot exam: 03/13/20 per PCP   Lab Results  Component Value Date   CHOL 115 07/24/2020   HDL 36.20 (L) 07/24/2020   LDLCALC 48 07/24/2020   LDLDIRECT 81.0 04/03/2019   TRIG 154.0 (H) 07/24/2020   CHOLHDL 3 07/24/2020    Hepatic Function Latest Ref Rng & Units 01/08/2021 07/24/2020 03/13/2020  Total Protein 6.0 - 8.3 g/dL 6.3 5.9(L) 6.2  Albumin 3.5 - 5.2 g/dL 4.0 3.8 3.8  AST 0 - 37 U/L $Remo'19 15 20  'myEYr$ ALT 0 - 53 U/L $Remo'19 18 21  'PsSoc$ Alk Phosphatase 39 - 117 U/L 68 65 97  Total Bilirubin 0.2 - 1.2 mg/dL 0.3 0.4 0.3    Lab Results  Component Value Date/Time   TSH 3.01 03/16/2018 12:51 PM   TSH 1.750 03/06/2014 04:39 PM   FREET4 1.04 03/06/2014 04:39 PM    CBC Latest Ref Rng & Units 02/21/2021 01/08/2021 02/25/2018  WBC 4.0 - 10.5 K/uL 9.4 8.1 8.1  Hemoglobin 13.0 - 17.0 g/dL 14.1 13.7 14.6  Hematocrit 39.0 - 52.0 % 43.2 41.1 43.1  Platelets 150 - 400 K/uL 179 180.0 200    Lab Results  Component Value Date/Time   VD25OH 35.82 07/24/2020 09:45 AM   VD25OH 41.02 04/03/2019 01:40 PM    Clinical ASCVD: No  The ASCVD Risk score (Arnett DK, et al., 2019) failed to calculate for the following reasons:   The valid total cholesterol range is 130 to 320 mg/dL     Depression screen [facility] 2/9 06/26/2020 04/03/2019 03/16/2018  Decreased Interest 0 0 0  Down, Depressed, Hopeless 0 0 0  PHQ - 2 Score 0 0 0  Altered sleeping 0 0 0  Tired, decreased energy 0 0 0  Change in  appetite 0 0 0  Feeling bad or failure about yourself  0 0 0  Trouble concentrating 0 0 0  Moving slowly or fidgety/restless 0 0 0  Suicidal thoughts 0 0 0  PHQ-9 Score 0 0 0  Difficult doing work/chores Not difficult at all - Not difficult at all  Some recent data might be hidden    Social History   Tobacco Use  Smoking Status Former   Packs/day: 1.50   Years: 51.00   Pack years: 76.50   Types: Cigarettes   Quit date: 2013   Years since quitting: 9.8  Smokeless Tobacco Former   Types: Snuff, Chew   Quit date: 07/06/2006   BP Readings from Last 3 Encounters:  02/21/21 135/72  01/08/21 138/72  07/01/20 130/82   Pulse Readings from Last 3 Encounters:  02/21/21 77  01/08/21 66  07/01/20 67   Wt Readings from Last 3 Encounters:  02/21/21 (!) 325 lb (147.4 kg)  01/08/21 (!) 325 lb 9 oz (147.7 kg)  07/24/20 (!) 312 lb (141.5 kg)   BMI Readings from Last 3 Encounters:  02/21/21 46.63 kg/m  01/08/21 46.71 kg/m  07/24/20 44.77 kg/m    Assessment/Interventions: Review of patient past medical history, allergies, medications, health status, including review of consultants reports, laboratory and other test data, was performed as part of comprehensive evaluation and provision of chronic care management services.   SDOH:  (Social Determinants of Health) assessments and interventions performed: No   SDOH Screenings   Alcohol Screen: Low Risk    Last Alcohol Screening Score (AUDIT): 1  Depression (PHQ2-9): Low Risk    PHQ-2 Score: 0  Financial Resource Strain: Low Risk    Difficulty of Paying Living Expenses: Not hard at all  Food Insecurity: No Food Insecurity   Worried About Charity fundraiser in the Last Year: Never true   Ran Out of Food in the Last Year: Never true  Housing: Low Risk    Last Housing Risk Score: 0  Physical Activity: Inactive   Days of Exercise per Week: 0 days   Minutes of Exercise per Session: 0 min  Social Connections: Not on file  Stress:  No Stress Concern Present   Feeling of Stress : Not at all  Tobacco Use: Medium Risk   Smoking Tobacco Use: Former   Smokeless Tobacco Use: Former   Passive Exposure: Not on Pensions consultant Needs: No Transportation Needs   Lack of Transportation (Medical): No   Lack of Transportation (Non-Medical): No    CCM Care Plan  No Known Allergies  Medications Reviewed Today     Reviewed by Debbora Dus, [facility] (Pharmacist) on 06/23/21  at [place] List Status: <None>   Medication Order Taking? Sig Documenting Provider Last Dose Status Informant  Accu-Chek FastClix Lancets MISC [id number]  Use as instructed to check blood sugar once daily. Ria Bush, MD  Active Self  acetaminophen (TYLENOL) 500 MG tablet [id number]  Take 1,000 mg by mouth every 6 (six) hours as needed for moderate pain or mild pain. [provider]  Active Self  albuterol (PROVENTIL HFA;VENTOLIN HFA) 108 (90 Base) MCG/ACT inhaler [id number]  Inhale 1-2 puffs into the lungs every 4 (four) hours as needed for wheezing or shortness of breath. [provider]  Active Self  Ascorbic Acid (VITAMIN C PO) [id number]  Take 1,000 mg by mouth daily. Super C [provider]  Active Self  aspirin EC 81 MG tablet [id number]  Take 1 tablet (81 mg total) by mouth daily. Day after surgery Susa Day, MD  Active   B Complex-C (SUPER B COMPLEX PO) [id number]  Take 1 tablet by mouth daily. [provider]  Active Self  Blood Glucose Monitoring Suppl (ACCU-CHEK GUIDE) w/Device KIT [id number]  Use as instructed to check blood sugar once daily. Ria Bush, MD  Active Self  cephALEXin [facility]) 500 MG capsule [id number]  Take 1 capsule (500 mg total) by mouth 4 (four) times daily. Starting 6 hours after scheduled surgery time Susa Day, MD  Active   Cholecalciferol (VITAMIN D3) 1.25 MG (50000 UT) CAPS [id number]  TAKE 1 CAPSULE BY MOUTH EVERY FRIDAY IN THE Mickeal Needy, MD  Active    docusate sodium (COLACE) 100 MG capsule [id number]  Take 1 capsule (100 mg total) by mouth 2 (two) times daily as needed for mild constipation. Susa Day, MD  Active   furosemide (LASIX) 40 MG tablet [id number] Yes Take 40 mg by mouth daily. Ria Bush, MD Taking Active Self  glucose blood (ACCU-CHEK GUIDE) test strip [id number]  Use as instructed to check blood sugar once daily. Ria Bush, MD  Active Self  glucose blood (ACCU-CHEK GUIDE) test strip [id number]  Use as instructed Ria Bush, MD  Active Self  labetalol (NORMODYNE) 200 MG tablet [id number] Yes Take 1 tablet (200 mg total) by mouth daily. Ria Bush, MD Taking Active Self  metFORMIN (GLUCOPHAGE) 500 MG tablet [id number] Yes TAKE 1 TABLET BY MOUTH  DAILY WITH Sander Radon, MD Taking Active   Multiple Vitamins-Minerals (MULTIVITAMIN ADULT PO) [id number]  Take 1 tablet by mouth daily. MEN'S 50+ MULTIVITAMIN. [provider]  Active Self  NIFEdipine (PROCARDIA XL/NIFEDICAL XL) 60 MG 24 hr tablet [id number] Yes Take 1 tablet (60 mg total) by mouth daily. Ria Bush, MD Taking Active Self  omeprazole (PRILOSEC) 40 MG capsule [id number] Yes Take 1 capsule (40 mg total) by mouth daily. Ria Bush, MD Taking Active Self  polyethylene glycol (MIRALAX / GLYCOLAX) 17 g packet [id number]  Take 17 g by mouth daily. Susa Day, MD  Active   potassium chloride SA (KLOR-CON) 20 MEQ tablet [id number] Yes Take 1 tablet (20 mEq total) by mouth daily. Ria Bush, MD Taking Active Self  valsartan (DIOVAN) 80 MG tablet [id number] Yes Take 1 tablet (80 mg total) by mouth daily. Adrian Prows, MD Taking Active Self  Zinc 100 MG TABS [id number]  Take 1 tablet (100 mg total) by mouth daily. Ria Bush, MD  Active Self            Patient Active Problem List   Diagnosis Date Noted   Left rotator cuff tear 01/08/2021   Nasal  congestion 07/02/2020   Medicare annual wellness visit,  subsequent 04/10/2019   Lump in the abdomen 01/10/2019   COPD (chronic obstructive pulmonary disease) ([place]) 71/69/6789   Diastolic dysfunction with chronic heart failure ([place]) 05/07/2018   Pedal edema 07/21/2017   Incomplete rotator cuff tear or rupture of right shoulder, not specified as traumatic 07/08/2017   Pre-op evaluation 06/14/2017   Vitamin D deficiency 06/14/2017   Controlled diabetes mellitus type 2 with complications ([place]) 38/05/1750   Health maintenance examination 12/22/2016   Advanced care planning/counseling discussion 12/22/2016   Chronic cough 11/04/2016   Lipoma of skin and subcutaneous tissue of neck 04/14/2016   AAA (abdominal aortic aneurysm) without rupture ([place]) 07/10/2014   Morbid obesity with BMI of 40.0-44.9, adult ([place]) 03/27/2014   Osteoarthritis of both knees 03/27/2014   Hypogonadism in male 03/06/2014   S/P AAA (abdominal aortic aneurysm) repair 07/04/2013   Dyslipidemia associated with type 2 diabetes mellitus ([place]) 06/16/2012   Hypertension 06/16/2012   Erectile dysfunction 06/16/2012   Ex-smoker 04/13/2011   Obstructive sleep apnea 04/13/2011    Immunization History  Administered Date(s) Administered   PFIZER(Purple Top)SARS-COV-2 Vaccination 06/13/2020, 07/04/2020   Pneumococcal Conjugate-13 03/06/2014   Pneumococcal Polysaccharide-23 09/10/2015   Td 03/06/2014    Conditions to be addressed/monitored:  Hypertension, Hyperlipidemia, Diabetes, and COPD  Care Plan : [place]  Updates made by Debbora Dus, [place] since 06/23/2021 12:00 AM     Problem: CHL AMB "PATIENT-SPECIFIC PROBLEM"      Long-Range Goal: Disease Management   Start Date: 01/01/2021  This Visit's Progress: On track  Priority: High  Note:   Current Barriers:  None identified   Pharmacist Clinical Goal(s):  Patient will contact provider office for questions/concerns as evidenced notation of same in electronic health record through collaboration with PharmD  and provider.   Interventions: 1:1 collaboration with Ria Bush, MD regarding development and update of comprehensive plan of care as evidenced by provider attestation and co-signature Inter-disciplinary care team collaboration (see longitudinal plan of care) Comprehensive medication review performed; medication list updated in electronic medical record  Hypertension (BP goal <140/90) -Controlled - per clinic readings  -Current treatment: Labetalol 200 mg - 1 daily in the morning  Nifedipine 60 mg 24 hour - 1 daily in the morning Valsartan 80 mg - 1 daily in the evening -Medications previously tried: HCTZ - Frequent urination -Wears CPAP every night  -Current home readings: purchased a home monitor but has not figured out how to use it yet -Current dietary habits: low carb, low cholesterol - wife cooks meals and watches what he eats; states he eats too much. We discussed weight loss and exercise goals. He would like to walk 1/4 mile a couple days per week. -Current exercise habits: none  -Denies hypotensive/hypertensive symptoms  -Bring blood pressure monitor to next visit for assistance. Medicare nurse came by a few weeks ago and BP was 130/80, 70 -Recommended to continue current medication; Patient would like to walk 1/4 mile a couple days per week.  Hyperlipidemia: (LDL goal < 100) -Controlled - LDL 41 -Stable, refills timely; followed by cardiology -Current treatment: Rosuvastatin 20 mg - 1 tablet daily Aspirin 81 mg - 1 tablet daily  -Medications previously tried: none -Recommended to continue current medication  Diabetes (A1c goal <7%) -Controlled - A1c  5.8% -Diagnosed Summer 2021, gave up sweet tea and bread. Ongoing weight loss.  -Current medications: Metformin 500 mg - 1 tablet daily before breakfast -Medications previously tried: glipizide  -  Current home glucose readings: none reported -Counseled to check feet daily and get yearly eye exams - Annual eye and  foot exam are due  -Recommended to continue current medication  COPD (Goal: control symptoms and prevent exacerbations) -Controlled - per patient report  -Current treatment  Albuterol PRN (no used in 2-3 years 06/23/21) -Medications previously tried: multiple allergy medications - reports none effective -Gold Grade: Gold 1 (FEV1>80%) -Current COPD Classification:  A (low sx, <2 exacerbations/yr) -Pulmonary function testing: June 2019 - FEV1 82% -Exacerbations requiring treatment in last 6 months: none -Frequency of rescue inhaler use: very rare -Recommended to continue current medication  Patient Goals/Self-Care Activities Patient will:  - focus on weight loss with exercise goals   Follow up:  PCP - May 2022  CCM visit - 12 months       Medication Assistance: None required.  Patient affirms current coverage meets needs.  Patient's preferred pharmacy is: [facility] [address], [address] [place], [place] [address] [place] [address] Phone: [phone] Fax: [phone]  Maintenance meds - mail order Acute meds -  [facility]  Uses pill box? Yes - Darlene fills medications from information on pill bottles Pt endorses 100% compliance  Care Plan and Follow Up Patient Decision:  Patient agrees to Care Plan and Follow-up.  Debbora Dus, PharmD Clinical Pharmacist [facility] at [facility] [phone]

## 2021-06-23 NOTE — Patient Instructions (Signed)
Dear William Hebert,  Below is a summary of the goals we discussed during our follow up appointment on June 23, 2021. Please contact me anytime with questions or concerns.   Visit Information  Patient Care Plan: CCM Pharmacy Care Plan     Problem Identified: CHL AMB "PATIENT-SPECIFIC PROBLEM"      Long-Range Goal: Disease Management   Start Date: 01/01/2021  This Visit's Progress: On track  Priority: High  Note:   Current Barriers:  None identified   Pharmacist Clinical Goal(s):  Patient will contact provider office for questions/concerns as evidenced notation of same in electronic health record through collaboration with PharmD and provider.   Interventions: 1:1 collaboration with Ria Bush, MD regarding development and update of comprehensive plan of care as evidenced by provider attestation and co-signature Inter-disciplinary care team collaboration (see longitudinal plan of care) Comprehensive medication review performed; medication list updated in electronic medical record  Hypertension (BP goal <140/90) -Controlled - per clinic readings  -Current treatment: Labetalol 200 mg - 1 daily in the morning  Nifedipine 60 mg 24 hour - 1 daily in the morning Valsartan 80 mg - 1 daily in the evening -Medications previously tried: HCTZ - Frequent urination -Wears CPAP every night  -Current home readings: purchased a home monitor but has not figured out how to use it yet -Current dietary habits: low carb, low cholesterol - wife cooks meals and watches what he eats; states he eats too much. We discussed weight loss and exercise goals. He would like to walk 1/4 mile a couple days per week. -Current exercise habits: none  -Denies hypotensive/hypertensive symptoms  -Bring blood pressure monitor to next visit for assistance. Medicare nurse came by a few weeks ago and BP was 130/80, 70 -Recommended to continue current medication; Patient would like to walk 1/4 mile a couple days  per week.  Hyperlipidemia: (LDL goal < 100) -Controlled - LDL 41 -Stable, refills timely; followed by cardiology -Current treatment: Rosuvastatin 20 mg - 1 tablet daily Aspirin 81 mg - 1 tablet daily  -Medications previously tried: none -Recommended to continue current medication  Diabetes (A1c goal <7%) -Controlled - A1c  5.8% -Diagnosed Summer 2021, gave up sweet tea and bread. Ongoing weight loss.  -Current medications: Metformin 500 mg - 1 tablet daily before breakfast -Medications previously tried: glipizide  -Current home glucose readings: none reported -Counseled to check feet daily and get yearly eye exams - Annual eye and foot exam are due  -Recommended to continue current medication  COPD (Goal: control symptoms and prevent exacerbations) -Controlled - per patient report  -Current treatment  Albuterol PRN (no used in 2-3 years 06/23/21) -Medications previously tried: multiple allergy medications - reports none effective -Gold Grade: Gold 1 (FEV1>80%) -Current COPD Classification:  A (low sx, <2 exacerbations/yr) -Pulmonary function testing: June 2019 - FEV1 82% -Exacerbations requiring treatment in last 6 months: none -Frequency of rescue inhaler use: very rare -Recommended to continue current medication  Patient Goals/Self-Care Activities Patient will:  - focus on weight loss with exercise goals   Follow up:  PCP - May 2022  CCM visit - 12 months       Patient verbalizes understanding of instructions provided today and agrees to view in [place].   Debbora Dus, PharmD Clinical Pharmacist [facility] at [facility] [phone]

## 2021-06-23 NOTE — Telephone Encounter (Addendum)
Patient would like to know if we have received records from cardiology office [facility], Dr. Pauline Aus). They saw the PA 05/29/21. If not, they would like [place] to request these. Please update patient's wife by MyChart.  Debbora Dus, PharmD Clinical Pharmacist [facility] at [facility] [phone]

## 2021-06-24 NOTE — Telephone Encounter (Signed)
Rx written and in Lisa's box.  Plz fax to requested location.  Patient will likely need OV for diabetic foot exam as last done > 1 yr ago (02/2020).  Regarding CPAP machine - new Rx written as well, but may need new sleep study as last I see in chart was from 03/2011.

## 2021-06-24 NOTE — Telephone Encounter (Signed)
I have not received any cardiology notes. Can we request? thanks

## 2021-06-26 NOTE — Telephone Encounter (Signed)
Faxed request to [facility]- Dr. Pauline Aus at [phone].

## 2021-06-27 DIAGNOSIS — I6523 Occlusion and stenosis of bilateral carotid arteries: Secondary | ICD-10-CM | POA: Insufficient documentation

## 2021-06-27 DIAGNOSIS — I4510 Unspecified right bundle-branch block: Secondary | ICD-10-CM | POA: Insufficient documentation

## 2021-06-27 NOTE — Telephone Encounter (Signed)
Plz notify wife I did receive [facility] cardiology records for patient. Thank you.

## 2021-06-27 NOTE — Telephone Encounter (Signed)
Faxed rxs to [facility] at [phone].

## 2021-06-27 NOTE — Telephone Encounter (Signed)
Spoke with pt relaying Dr. Synthia Innocent message.  Pt verbalizes understanding and expresses his thanks for the call.

## 2021-06-30 DIAGNOSIS — E118 Type 2 diabetes mellitus with unspecified complications: Secondary | ICD-10-CM | POA: Diagnosis not present

## 2021-06-30 DIAGNOSIS — I10 Essential (primary) hypertension: Secondary | ICD-10-CM | POA: Diagnosis not present

## 2021-07-04 ENCOUNTER — Other Ambulatory Visit: Payer: Self-pay | Admitting: Cardiology

## 2021-07-04 ENCOUNTER — Other Ambulatory Visit: Payer: Self-pay | Admitting: Family Medicine

## 2021-07-18 ENCOUNTER — Ambulatory Visit (INDEPENDENT_AMBULATORY_CARE_PROVIDER_SITE_OTHER)
Admission: RE | Admit: 2021-07-18 | Discharge: 2021-07-18 | Disposition: A | Discharge status: Home / Self Care | Payer: Medicare Other | Source: Ambulatory Visit | Attending: Family Medicine | Admitting: Family Medicine

## 2021-07-18 ENCOUNTER — Encounter: Payer: Self-pay | Admitting: Family Medicine

## 2021-07-18 ENCOUNTER — Ambulatory Visit (INDEPENDENT_AMBULATORY_CARE_PROVIDER_SITE_OTHER): Payer: Medicare Other | Admitting: Family Medicine

## 2021-07-18 ENCOUNTER — Other Ambulatory Visit: Payer: Self-pay

## 2021-07-18 VITALS — BP 140/80 | HR 64 | Temp 97.7°F | Ht 71.0 in | Wt 330.4 lb

## 2021-07-18 DIAGNOSIS — Z0000 Encounter for general adult medical examination without abnormal findings: Secondary | ICD-10-CM

## 2021-07-18 DIAGNOSIS — I890 Lymphedema, not elsewhere classified: Secondary | ICD-10-CM

## 2021-07-18 DIAGNOSIS — Z87891 Personal history of nicotine dependence: Secondary | ICD-10-CM

## 2021-07-18 DIAGNOSIS — M545 Low back pain, unspecified: Secondary | ICD-10-CM

## 2021-07-18 DIAGNOSIS — E559 Vitamin D deficiency, unspecified: Secondary | ICD-10-CM

## 2021-07-18 DIAGNOSIS — Z6841 Body Mass Index (BMI) 40.0 and over, adult: Secondary | ICD-10-CM

## 2021-07-18 DIAGNOSIS — I10 Essential (primary) hypertension: Secondary | ICD-10-CM

## 2021-07-18 DIAGNOSIS — Z7189 Other specified counseling: Secondary | ICD-10-CM

## 2021-07-18 DIAGNOSIS — E118 Type 2 diabetes mellitus with unspecified complications: Secondary | ICD-10-CM

## 2021-07-18 DIAGNOSIS — E1169 Type 2 diabetes mellitus with other specified complication: Secondary | ICD-10-CM

## 2021-07-18 DIAGNOSIS — M75111 Incomplete rotator cuff tear or rupture of right shoulder, not specified as traumatic: Secondary | ICD-10-CM

## 2021-07-18 DIAGNOSIS — Z8679 Personal history of other diseases of the circulatory system: Secondary | ICD-10-CM

## 2021-07-18 NOTE — Progress Notes (Signed)
Patient ID: William Hebert, male    DOB: 10/31/1945, 75 y.o.   MRN: [id number]  This visit was conducted in person.  BP 140/80   Pulse 64   Temp 97.7 F (36.5 C) (Temporal)   Ht $R'5\' 11"'vR$  (1.803 m)   Wt (!) 330 lb 6 oz (149.9 kg)   SpO2 95%   BMI 46.08 kg/m    CC: AMW Subjective:   HPI: William Hebert is a 75 y.o. male presenting on 07/18/2021 for Medicare Wellness   Did not see health advisor this year.   Hearing Screening   '500Hz'$  $Remo'1000Hz'rORpW$'2000Hz'$'4000Hz'$   Right ear 25 40 25 0  Left ear $Remove'20 25 25 'tRqeiqv$ 0  Vision Screening - Comments:: Last eye exam, 07/2021.  Flowsheet Row Clinical Support from 06/26/2020 in [facility] at [facility]  PHQ-2 Total Score 0       Fall Risk  07/18/2021 06/26/2020 04/03/2019 03/16/2018 11/25/2016  Falls in the past year? 0 0 0 No No  Number falls in past yr: - 0 - - -  Injury with Fall? - 0 - - -  Risk for fall due to : - Medication side effect Medication side effect - -  Follow up - Falls evaluation completed;Falls prevention discussed Falls evaluation completed;Falls prevention discussed - -   R RTC repair earlier this year ([facility]).  Recently saw podiatrist Dr Bosie Helper in [place] biopsy for neuropathy. No burning pain or tingling/numbness. Feet stay sore worse with walking. No calf pain.   Ongoing R mid to lower back pain for months. Treating with heating pad at night time and 2 tylenol, 1 motrin daily. No shooting pain down legs, numbness or weakness of legs, saddle anesthesias, bowel/bladder accidents. No inciting trauma/injury or fall.   DM - avoiding sweet tea, drinking plenty of mio diet.   Eye exam seen last week at [facility] in [place]. Treated for eye infection with drops with benefit. new glasses.   Recent dental work for infected R upper tooth.  Preventative: COLONOSCOPY WITH PROPOFOL 02/23/2017 - 4 TA polyps, rpt 3-5 yrs ([facility]) Prostate cancer screening - declines DRE. checks with PSA.  Lung cancer  screening - eligible, had done 2019, agrees to return. New referral placed.  Flu shot - not done  COVID vaccine Pfizer 05/2020, 07/2020, no boosters Td 2015  Pneumovax 2017, prevnar-13 2015  Shingrix - declines - has had shingles   Advanced directive discussion - would want daughter [name] to be HCPOA - also would want wife. Packet previously provided - encouraged he work on this.  Seat belt use discussed  Sunscreen use discussed. No changing moles on skin.  Ex smoker - quit ~2007. Prior smoked 2 ppd. ~75 PY hx Alcohol - rare Dentist - Q87mo  Eye exam yearly  Bowel - no diarrhea/constipation Bladder - no incontinence  Lives with wife Occ: semi-retired Corporate treasurer, works for [employer] Edu: HS Activity: swimming 3x/wk Diet: no water, 1/2 gallon sweet tea - decreasing amt sugar in it though, some fruits/vegetables      Relevant past medical, surgical, family and social history reviewed and updated as indicated. Interim medical history since our last visit reviewed. Allergies and medications reviewed and updated. Outpatient Medications Prior to Visit  Medication Sig Dispense Refill   Accu-Chek FastClix Lancets MISC Use as instructed to check blood sugar once daily. 100 each 0   acetaminophen (TYLENOL) 500 MG tablet Take 1,000 mg by mouth every  6 (six) hours as needed for moderate pain or mild pain.     albuterol (PROVENTIL HFA;VENTOLIN HFA) 108 (90 Base) MCG/ACT inhaler Inhale 1-2 puffs into the lungs every 4 (four) hours as needed for wheezing or shortness of breath.     Ascorbic Acid (VITAMIN C PO) Take 1,000 mg by mouth daily. Super C     aspirin EC 81 MG tablet Take 1 tablet (81 mg total) by mouth daily. Day after surgery 30 tablet 1   B Complex-C (SUPER B COMPLEX PO) Take 1 tablet by mouth daily.     Blood Glucose Monitoring Suppl (ACCU-CHEK GUIDE) w/Device KIT Use as instructed to check blood sugar once daily. 1 kit 0   cephALEXin (KEFLEX) 500 MG capsule Take 1  capsule (500 mg total) by mouth 4 (four) times daily. Starting 6 hours after scheduled surgery time 6 capsule 1   Cholecalciferol (VITAMIN D3) 1.25 MG (50000 UT) CAPS TAKE 1 CAPSULE BY MOUTH EVERY FRIDAY IN THE MORNING 12 capsule 1   docusate sodium (COLACE) 100 MG capsule Take 1 capsule (100 mg total) by mouth 2 (two) times daily as needed for mild constipation. 30 capsule 1   furosemide (LASIX) 40 MG tablet Take 40 mg by mouth daily.     glucose blood (ACCU-CHEK GUIDE) test strip Use as instructed to check blood sugar once daily. 100 each 1   glucose blood (ACCU-CHEK GUIDE) test strip Use as instructed 100 each 12   labetalol (NORMODYNE) 200 MG tablet TAKE 1 TABLET BY MOUTH  DAILY 90 tablet 0   metFORMIN (GLUCOPHAGE) 500 MG tablet TAKE 1 TABLET BY MOUTH  DAILY WITH BREAKFAST 90 tablet 3   Multiple Vitamins-Minerals (MULTIVITAMIN ADULT PO) Take 1 tablet by mouth daily. MEN'S 50+ MULTIVITAMIN.     NIFEdipine (PROCARDIA XL/NIFEDICAL XL) 60 MG 24 hr tablet TAKE 1 TABLET BY MOUTH  DAILY 90 tablet 0   polyethylene glycol (MIRALAX / GLYCOLAX) 17 g packet Take 17 g by mouth daily. 14 each 0   potassium chloride SA (KLOR-CON) 20 MEQ tablet TAKE 1 TABLET BY MOUTH  DAILY 90 tablet 0   rosuvastatin (CRESTOR) 20 MG tablet TAKE 1 TABLET BY MOUTH  DAILY 90 tablet 3   valsartan (DIOVAN) 80 MG tablet TAKE 1 TABLET BY MOUTH  DAILY 90 tablet 3   Zinc 100 MG TABS Take 1 tablet (100 mg total) by mouth daily. 90 tablet 3   omeprazole (PRILOSEC) 40 MG capsule Take 1 capsule (40 mg total) by mouth daily. 90 capsule 3   No facility-administered medications prior to visit.     Per HPI unless specifically indicated in ROS section below Review of Systems  Constitutional:  Negative for activity change, appetite change, chills, fatigue, fever and unexpected weight change.  HENT:  Negative for hearing loss.   Eyes:  Negative for visual disturbance.  Respiratory:  Positive for cough (occ). Negative for chest tightness,  shortness of breath and wheezing.   Cardiovascular:  Positive for leg swelling (chronic). Negative for chest pain and palpitations.  Gastrointestinal:  Positive for diarrhea. Negative for abdominal distention, abdominal pain, blood in stool, constipation, nausea and vomiting.  Genitourinary:  Negative for difficulty urinating and hematuria.  Musculoskeletal:  Negative for arthralgias, myalgias and neck pain.  Skin:  Negative for rash.  Neurological:  Negative for dizziness, seizures, syncope and headaches.  Hematological:  Negative for adenopathy. Bruises/bleeds easily.  Psychiatric/Behavioral:  Negative for dysphoric mood. The patient is not nervous/anxious.    Objective:  BP 140/80   Pulse 64   Temp 97.7 F (36.5 C) (Temporal)   Ht $R'5\' 11"'FZ$  (1.803 m)   Wt (!) 330 lb 6 oz (149.9 kg)   SpO2 95%   BMI 46.08 kg/m   Wt Readings from Last 3 Encounters:  07/18/21 (!) 330 lb 6 oz (149.9 kg)  02/21/21 (!) 325 lb (147.4 kg)  01/08/21 (!) 325 lb 9 oz (147.7 kg)      Physical Exam Vitals and nursing note reviewed.  Constitutional:      General: He is not in acute distress.    Appearance: Normal appearance. He is well-developed. He is not ill-appearing.  HENT:     Head: Normocephalic and atraumatic.     Right Ear: Hearing, tympanic membrane, ear canal and external ear normal.     Left Ear: Hearing, tympanic membrane, ear canal and external ear normal.  Eyes:     General: No scleral icterus.    Extraocular Movements: Extraocular movements intact.     Conjunctiva/sclera: Conjunctivae normal.     Pupils: Pupils are equal, round, and reactive to light.  Neck:     Thyroid: No thyroid mass or thyromegaly.     Vascular: No carotid bruit.  Cardiovascular:     Rate and Rhythm: Normal rate and regular rhythm.     Pulses: Normal pulses.          Radial pulses are 2+ on the right side and 2+ on the left side.     Heart sounds: Normal heart sounds. No murmur heard. Pulmonary:     Effort:  Pulmonary effort is normal. No respiratory distress.     Breath sounds: Normal breath sounds. No wheezing, rhonchi or rales.  Abdominal:     General: Bowel sounds are normal. There is no distension.     Palpations: Abdomen is soft. There is no mass.     Tenderness: There is no abdominal tenderness. There is no guarding or rebound.     Hernia: No hernia is present.  Musculoskeletal:        General: Normal range of motion.     Cervical back: Normal range of motion and neck supple.       Back:     Right lower leg: No edema.     Left lower leg: No edema.     Comments:  Midline discomfort lower thoracic into lumbar spine.  Tender swelling to right of midline at thoracolumbar junction   Lymphadenopathy:     Cervical: No cervical adenopathy.  Skin:    General: Skin is warm and dry.     Findings: No rash.  Neurological:     General: No focal deficit present.     Mental Status: He is alert and oriented to person, place, and time.     Comments:  Recall 3/3  Calculation 4/5 DROW  Psychiatric:        Mood and Affect: Mood normal.        Behavior: Behavior normal.        Thought Content: Thought content normal.        Judgment: Judgment normal.      Results for orders placed or performed in visit on 07/18/21  Lipid panel  Result Value Ref Range   Cholesterol 138 <200 mg/dL   HDL 33 (L) > OR = 40 mg/dL   Triglycerides 177 (H) <150 mg/dL   LDL Cholesterol (Calc) 78 mg/dL (calc)   Total CHOL/HDL Ratio 4.2 <5.0 (calc)   Non-HDL  Cholesterol (Calc) 105 <130 mg/dL (calc)  Comprehensive metabolic panel  Result Value Ref Range   Glucose, Bld 98 65 - 99 mg/dL   BUN 16 7 - 25 mg/dL   Creat 1.24 0.70 - 1.28 mg/dL   BUN/Creatinine Ratio NOT APPLICABLE 6 - 22 (calc)   Sodium 141 135 - 146 mmol/L   Potassium 3.7 3.5 - 5.3 mmol/L   Chloride 104 98 - 110 mmol/L   CO2 25 20 - 32 mmol/L   Calcium 9.5 8.6 - 10.3 mg/dL   Total Protein 6.0 (L) 6.1 - 8.1 g/dL   Albumin 3.9 3.6 - 5.1 g/dL    Globulin 2.1 1.9 - 3.7 g/dL (calc)   AG Ratio 1.9 1.0 - 2.5 (calc)   Total Bilirubin 0.4 0.2 - 1.2 mg/dL   Alkaline phosphatase (APISO) 66 35 - 144 U/L   AST 14 10 - 35 U/L   ALT 16 9 - 46 U/L  Hemoglobin A1c  Result Value Ref Range   Hgb A1c MFr Bld 6.5 (H) <5.7 % of total Hgb   Mean Plasma Glucose 140 mg/dL   eAG (mmol/L) 7.7 mmol/L  CBC with Differential/Platelet  Result Value Ref Range   WBC 7.4 3.8 - 10.8 Thousand/uL   RBC 4.90 4.20 - 5.80 Million/uL   Hemoglobin 14.7 13.2 - 17.1 g/dL   HCT 43.5 38.5 - 50.0 %   MCV 88.8 80.0 - 100.0 fL   MCH 30.0 27.0 - 33.0 pg   MCHC 33.8 32.0 - 36.0 g/dL   RDW 12.7 11.0 - 15.0 %   Platelets 204 140 - 400 Thousand/uL   MPV 10.3 7.5 - 12.5 fL   Neutro Abs 5,284 1,500 - 7,800 cells/uL   Lymphs Abs 1,302 850 - 3,900 cells/uL   Absolute Monocytes 592 200 - 950 cells/uL   Eosinophils Absolute 141 15 - 500 cells/uL   Basophils Absolute 81 0 - 200 cells/uL   Neutrophils Relative % 71.4 %   Total Lymphocyte 17.6 %   Monocytes Relative 8.0 %   Eosinophils Relative 1.9 %   Basophils Relative 1.1 %  VITAMIN D 25 Hydroxy (Vit-D Deficiency, Fractures)  Result Value Ref Range   Vit D, 25-Hydroxy 64 30 - 100 ng/mL    Assessment & Plan:  This visit occurred during the SARS-CoV-2 public health emergency.  Safety protocols were in place, including screening questions prior to the visit, additional usage of staff PPE, and extensive cleaning of exam room while observing appropriate contact time as indicated for disinfecting solutions.   Problem List Items Addressed This Visit     Health maintenance examination (Chronic)    Preventative protocols reviewed and updated unless pt declined. Discussed healthy diet and lifestyle.       Advanced care planning/counseling discussion (Chronic)    Advanced directive discussion - would want daughter [name] to be HCPOA - also would want wife. Packet previously provided - encouraged he work on this.        Medicare annual wellness visit, subsequent - Primary (Chronic)    I have personally reviewed the Medicare Annual Wellness questionnaire and have noted 1. The patient's medical and social history 2. Their use of alcohol, tobacco or illicit drugs 3. Their current medications and supplements 4. The patient's functional ability including ADL's, fall risks, home safety risks and hearing or visual impairment. Cognitive function has been assessed and addressed as indicated.  5. Diet and physical activity 6. Evidence for depression or mood disorders The patients weight,  height, BMI have been recorded in the chart. I have made referrals, counseling and provided education to the patient based on review of the above and I have provided the pt with a written personalized care plan for preventive services. Provider list updated.. See scanned questionairre as needed for further documentation. Reviewed preventative protocols and updated unless pt declined.       Ex-smoker    Quit smoking ~2007 Refer back to lung cancer screening program.      Dyslipidemia associated with type 2 diabetes mellitus ([place])    Update FLP on crestor $RemoveBe'20mg'MOZyOUxsw$  daily. The 10-year ASCVD risk score (Arnett DK, et al., 2019) is: 48.6%   Values used to calculate the score:     Age: 56 years     Sex: Male     Is Non-Hispanic African American: No     Diabetic: Yes     Tobacco smoker: No     Systolic Blood Pressure: 683 mmHg     Is BP treated: Yes     HDL Cholesterol: 33 mg/dL     Total Cholesterol: 138 mg/dL       Relevant Orders   Lipid panel (Completed)   Comprehensive metabolic panel (Completed)   Hypertension    Chronic, adequate - continue current regimen including lasix $RemoveBeforeD'40mg'qpmLBFrHPuvfRp$  daily, labetalol $RemoveBeforeDE'200mg'GGzvidOwNuiwaYA$  daily, nifedipine $RemoveBeforeDEI'60mg'UAuSuZzxMElmptkP$  daily, and valsartan $RemoveBefor'80mg'QgjqvRKOOKRV$  daily.       S/P AAA (abdominal aortic aneurysm) repair    Continue regular VVS f/u      Morbid obesity with BMI of 40.0-44.9, adult ([place])    Encouraged healthy diet and  lifestyle choices to affect sustainable weight loss.      Vitamin D deficiency    Update levels on weekly replacement.       Relevant Orders   VITAMIN D 25 Hydroxy (Vit-D Deficiency, Fractures) (Completed)   Controlled diabetes mellitus type 2 with complications ([facility])    Chronic, stable only on metformin $RemoveBefo'500mg'WLsESaPezfn$  daily - continue.  Will request records of latest eye exam.  Continue avoiding sweet tea.      Relevant Orders   Hemoglobin A1c (Completed)   Incomplete rotator cuff tear or rupture of right shoulder, not specified as traumatic    S/p R RTC repair earlier this year ([facility])      Lymphedema   Right-sided low back pain without sciatica    Reproducible pain to R lower back - anticipate MSK cause. Given chronicity, will check lumbar films. Discussed ongoing heating pad, tylenol/motrin. No red flags.       Relevant Orders   DG Lumbar Spine Complete (Completed)   CBC with Differential/Platelet (Completed)     Meds ordered this encounter  Medications   omeprazole (PRILOSEC) 40 MG capsule    Sig: Take 1 capsule (40 mg total) by mouth daily.    Dispense:  90 capsule    Refill:  3    Orders Placed This Encounter  Procedures   DG Lumbar Spine Complete    Standing Status:   Future    Number of Occurrences:   1    Standing Expiration Date:   07/18/2022    Order Specific Question:   Reason for Exam (SYMPTOM  OR DIAGNOSIS REQUIRED)    Answer:   low back pain with tender skin lump to right of midline    Order Specific Question:   Preferred imaging location?    Answer:   [facility]   Lipid panel   Comprehensive metabolic panel   Hemoglobin A1c  CBC with Differential/Platelet   VITAMIN D 25 Hydroxy (Vit-D Deficiency, Fractures)     Patient instructions: Labs today We will request latest diabetic eye exam from [facility] doctor in [place] will soon be due for repeat colon cancer screening.  If interested, check with pharmacy about new 2 shot shingles  series (shingrix).  Work on living will, bring [place] a copy when complete to update your chart.  Xray of lower back today  You may be due for repeat CT of chest.  Return as needed or in 6 months for follow up visit.   Follow up plan: Return in about 6 months (around 01/15/2022) for follow up visit.  Ria Bush, MD

## 2021-07-18 NOTE — Patient Instructions (Addendum)
Labs today We will request latest diabetic eye exam from [facility] doctor in [place] will soon be due for repeat colon cancer screening.  If interested, check with pharmacy about new 2 shot shingles series (shingrix).  Work on living will, bring [place] a copy when complete to update your chart.  Xray of lower back today  You may be due for repeat CT of chest.  Return as needed or in 6 months for follow up visit.   Health Maintenance After Age 65 After age 80, you are at a higher risk for certain long-term diseases and infections as well as injuries from falls. Falls are a major cause of broken bones and head injuries in people who are older than age 62. Getting regular preventive care can help to keep you healthy and well. Preventive care includes getting regular testing and making lifestyle changes as recommended by your health care provider. Talk with your health care provider about: Which screenings and tests you should have. A screening is a test that checks for a disease when you have no symptoms. A diet and exercise plan that is right for you. What should I know about screenings and tests to prevent falls? Screening and testing are the best ways to find a health problem early. Early diagnosis and treatment give you the best chance of managing medical conditions that are common after age 8. Certain conditions and lifestyle choices may make you more likely to have a fall. Your health care provider may recommend: Regular vision checks. Poor vision and conditions such as cataracts can make you more likely to have a fall. If you wear glasses, make sure to get your prescription updated if your vision changes. Medicine review. Work with your health care provider to regularly review all of the medicines you are taking, including over-the-counter medicines. Ask your health care provider about any side effects that may make you more likely to have a fall. Tell your health care provider if any  medicines that you take make you feel dizzy or sleepy. Strength and balance checks. Your health care provider may recommend certain tests to check your strength and balance while standing, walking, or changing positions. Foot health exam. Foot pain and numbness, as well as not wearing proper footwear, can make you more likely to have a fall. Screenings, including: Osteoporosis screening. Osteoporosis is a condition that causes the bones to get weaker and break more easily. Blood pressure screening. Blood pressure changes and medicines to control blood pressure can make you feel dizzy. Depression screening. You may be more likely to have a fall if you have a fear of falling, feel depressed, or feel unable to do activities that you used to do. Alcohol use screening. Using too much alcohol can affect your balance and may make you more likely to have a fall. Follow these instructions at home: Lifestyle Do not drink alcohol if: Your health care provider tells you not to drink. If you drink alcohol: Limit how much you have to: 0-1 drink a day for women. 0-2 drinks a day for men. Know how much alcohol is in your drink. In the [place] one drink equals one 12 oz bottle of beer (355 mL), one 5 oz glass of wine (148 mL), or one 1 oz glass of hard liquor (44 mL). Do not use any products that contain nicotine or tobacco. These products include cigarettes, chewing tobacco, and vaping devices, such as e-cigarettes. If you need help quitting, ask your health care  provider. Activity  Follow a regular exercise program to stay fit. This will help you maintain your balance. Ask your health care provider what types of exercise are appropriate for you. If you need a cane or walker, use it as recommended by your health care provider. Wear supportive shoes that have nonskid soles. Safety  Remove any tripping hazards, such as rugs, cords, and clutter. Install safety equipment such as grab bars in bathrooms and  safety rails on stairs. Keep rooms and walkways well-lit. General instructions Talk with your health care provider about your risks for falling. Tell your health care provider if: You fall. Be sure to tell your health care provider about all falls, even ones that seem minor. You feel dizzy, tiredness (fatigue), or off-balance. Take over-the-counter and prescription medicines only as told by your health care provider. These include supplements. Eat a healthy diet and maintain a healthy weight. A healthy diet includes low-fat dairy products, low-fat (lean) meats, and fiber from whole grains, beans, and lots of fruits and vegetables. Stay current with your vaccines. Schedule regular health, dental, and eye exams. Summary Having a healthy lifestyle and getting preventive care can help to protect your health and wellness after age 94. Screening and testing are the best way to find a health problem early and help you avoid having a fall. Early diagnosis and treatment give you the best chance for managing medical conditions that are more common for people who are older than age 61. Falls are a major cause of broken bones and head injuries in people who are older than age 106. Take precautions to prevent a fall at home. Work with your health care provider to learn what changes you can make to improve your health and wellness and to prevent falls. This information is not intended to replace advice given to you by your health care provider. Make sure you discuss any questions you have with your health care provider. Document Revised: 01/06/2021 Document Reviewed: 01/06/2021 Elsevier Patient Education  [place].

## 2021-07-18 NOTE — Assessment & Plan Note (Signed)
Preventative protocols reviewed and updated unless pt declined. Discussed healthy diet and lifestyle.  

## 2021-07-18 NOTE — Assessment & Plan Note (Signed)

## 2021-07-19 LAB — COMPREHENSIVE METABOLIC PANEL
AG Ratio: 1.9 (calc) (ref 1.0–2.5)
ALT: 16 U/L (ref 9–46)
AST: 14 U/L (ref 10–35)
Albumin: 3.9 g/dL (ref 3.6–5.1)
Alkaline phosphatase (APISO): 66 U/L (ref 35–144)
BUN: 16 mg/dL (ref 7–25)
CO2: 25 mmol/L (ref 20–32)
Calcium: 9.5 mg/dL (ref 8.6–10.3)
Chloride: 104 mmol/L (ref 98–110)
Creat: 1.24 mg/dL (ref 0.70–1.28)
Globulin: 2.1 g/dL (calc) (ref 1.9–3.7)
Glucose, Bld: 98 mg/dL (ref 65–99)
Potassium: 3.7 mmol/L (ref 3.5–5.3)
Sodium: 141 mmol/L (ref 135–146)
Total Bilirubin: 0.4 mg/dL (ref 0.2–1.2)
Total Protein: 6 g/dL — ABNORMAL LOW (ref 6.1–8.1)

## 2021-07-19 LAB — CBC WITH DIFFERENTIAL/PLATELET
Absolute Monocytes: 592 cells/uL (ref 200–950)
Basophils Absolute: 81 cells/uL (ref 0–200)
Basophils Relative: 1.1 %
Eosinophils Absolute: 141 cells/uL (ref 15–500)
Eosinophils Relative: 1.9 %
HCT: 43.5 % (ref 38.5–50.0)
Hemoglobin: 14.7 g/dL (ref 13.2–17.1)
Lymphs Abs: 1302 cells/uL (ref 850–3900)
MCH: 30 pg (ref 27.0–33.0)
MCHC: 33.8 g/dL (ref 32.0–36.0)
MCV: 88.8 fL (ref 80.0–100.0)
MPV: 10.3 fL (ref 7.5–12.5)
Monocytes Relative: 8 %
Neutro Abs: 5284 cells/uL (ref 1500–7800)
Neutrophils Relative %: 71.4 %
Platelets: 204 10*3/uL (ref 140–400)
RBC: 4.9 10*6/uL (ref 4.20–5.80)
RDW: 12.7 % (ref 11.0–15.0)
Total Lymphocyte: 17.6 %
WBC: 7.4 10*3/uL (ref 3.8–10.8)

## 2021-07-19 LAB — HEMOGLOBIN A1C
Hgb A1c MFr Bld: 6.5 % of total Hgb — ABNORMAL HIGH (ref <5.7)
Mean Plasma Glucose: 140 mg/dL
eAG (mmol/L): 7.7 mmol/L

## 2021-07-19 LAB — LIPID PANEL
Cholesterol: 138 mg/dL (ref <200)
HDL: 33 mg/dL — ABNORMAL LOW (ref >=40)
LDL Cholesterol (Calc): 78 mg/dL (calc)
Non-HDL Cholesterol (Calc): 105 mg/dL (calc) (ref <130)
Total CHOL/HDL Ratio: 4.2 (calc) (ref <5.0)
Triglycerides: 177 mg/dL — ABNORMAL HIGH (ref <150)

## 2021-07-19 LAB — VITAMIN D 25 HYDROXY (VIT D DEFICIENCY, FRACTURES): Vit D, 25-Hydroxy: 64 ng/mL (ref 30–100)

## 2021-07-20 DIAGNOSIS — M545 Low back pain, unspecified: Secondary | ICD-10-CM | POA: Insufficient documentation

## 2021-07-20 MED ORDER — OMEPRAZOLE 40 MG PO CPDR: 40.0000 mg | DELAYED_RELEASE_CAPSULE | Freq: Every day | ORAL | 3 refills | Status: DC

## 2021-07-20 NOTE — Assessment & Plan Note (Signed)
Encouraged healthy diet and lifestyle choices to affect sustainable weight loss.  ?

## 2021-07-20 NOTE — Assessment & Plan Note (Addendum)
Update FLP on crestor 20mg  daily. The 10-year ASCVD risk score (Arnett DK, et al., 2019) is: 48.6%   Values used to calculate the score:     Age: 75 years     Sex: Male     Is Non-Hispanic African American: No     Diabetic: Yes     Tobacco smoker: No     Systolic Blood Pressure: 974 mmHg     Is BP treated: Yes     HDL Cholesterol: 33 mg/dL     Total Cholesterol: 138 mg/dL

## 2021-07-20 NOTE — Assessment & Plan Note (Addendum)
Reproducible pain to R lower back - anticipate MSK cause. Given chronicity, will check lumbar films. Discussed ongoing heating pad, tylenol/motrin. No red flags.

## 2021-07-20 NOTE — Assessment & Plan Note (Signed)
Advanced directive discussion - would want daughter [name] to be HCPOA - also would want wife. Packet previously provided - encouraged he work on this.

## 2021-07-20 NOTE — Assessment & Plan Note (Signed)
Continue regular VVS f/u

## 2021-07-20 NOTE — Assessment & Plan Note (Signed)
Chronic, adequate - continue current regimen including lasix 40mg  daily, labetalol 200mg  daily, nifedipine 60mg  daily, and valsartan 80mg  daily.

## 2021-07-20 NOTE — Assessment & Plan Note (Signed)
Quit smoking ~2007 Refer back to lung cancer screening program.

## 2021-07-20 NOTE — Assessment & Plan Note (Signed)
S/p R RTC repair earlier this year ([place])

## 2021-07-20 NOTE — Assessment & Plan Note (Signed)
Update levels on weekly replacement.  

## 2021-07-20 NOTE — Assessment & Plan Note (Addendum)
Chronic, stable only on metformin 500mg  daily - continue.  Will request records of latest eye exam.  Continue avoiding sweet tea.

## 2021-07-28 ENCOUNTER — Telehealth: Payer: Self-pay | Admitting: Family Medicine

## 2021-07-28 NOTE — Telephone Encounter (Signed)
[facility] has been trying to get info from Dr. Darnell Level for her his diabetic shoes. And she found out Social Security requires him to get the see about his shoes, and they stated that they have faxed the paperwork twice. And its for diabetic shoes.

## 2021-07-28 NOTE — Telephone Encounter (Signed)
I have not seen a fax for pt from them.  I called [facility] in [place], [place] to confirm they have correct fax #.  Told they will fax form to [phone] today.

## 2021-07-29 NOTE — Telephone Encounter (Signed)
Received faxed order.  I faxed recent requested DM notes.

## 2021-07-30 NOTE — Telephone Encounter (Signed)
Received faxed DM shoes order from [facility] order in Dr. Synthia Innocent box.

## 2021-07-30 NOTE — Telephone Encounter (Signed)
Filled and in Lisa's box. I have not done recent foot exam. If needed, they will need to get one from his podiatrist in [facility] (Dr Bosie Helper)

## 2021-07-31 NOTE — Telephone Encounter (Addendum)
Faxed form.   Left message on vm per dpr relaying Dr. Synthia Innocent message.

## 2021-08-12 ENCOUNTER — Telehealth: Payer: Self-pay | Admitting: Family Medicine

## 2021-08-12 DIAGNOSIS — G629 Polyneuropathy, unspecified: Secondary | ICD-10-CM

## 2021-08-12 NOTE — Telephone Encounter (Signed)
Patient's wife has called stating that he went to [facility] and she wants for Dr.G to see the most recent diagnosis they made. In order to receive that, a request will have to be made to their office to Dr. Delorise Royals for the records.   [phone]  ALSO- patient is in need of a new sleep study (insurance requiring). Requested to order an at home sleep study   Please advise

## 2021-08-12 NOTE — Telephone Encounter (Addendum)
Spoke with pt's wife, Carlyon Shadow (on dpr), about records request.  States they [pt/pt's wife] want Dr. Darnell Level to look at notes from recent visit.  Faxed request to [place] at [phone].  Also, they are requesting order for home sleep study due to pt does not want to go into office to have it done. Says insurance will cover. Request from somewhere in [place], [place] or [place] area.

## 2021-08-18 DIAGNOSIS — G629 Polyneuropathy, unspecified: Secondary | ICD-10-CM | POA: Insufficient documentation

## 2021-08-18 NOTE — Telephone Encounter (Addendum)
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 Would aim for good sugar control to start. What was podiatrist recommendation regarding next steps? Would they like neurology referral?  Rx written for home sleep test and placed in Lisa's box. They may need to help [place] in finding local home health agency that does this.

## 2021-08-19 NOTE — Telephone Encounter (Addendum)
Lvm asking pt to call back.  Need to relay Dr. Synthia Innocent message.   [Written rx is in Copy on [employer].]

## 2021-08-20 NOTE — Telephone Encounter (Signed)
Spoke with William Hebert/William Hebert's wife, Carlyon Shadow (on dpr), relaying Dr. Synthia Innocent message.  Verbalizes understanding and declines neuro referral at this time.  Says podiatry wanted to start William Hebert on med for neuropathy but William Hebert declined.  So they started vitamins.  As for the DM shoes, William Hebert was told by [facility] that the order needs to state Dr. Darnell Level reviewed Dr. Josie Saunders report and concurs for shoes to be covered at 100%.  Carlyon Shadow will have [employer] another order.     Also, she will send a MyChart message with info about a local company that will do home sleep study so we can fax the order.

## 2021-08-20 NOTE — Telephone Encounter (Signed)
Lvm asking pt to call back.  Need to relay Dr. Synthia Innocent message.   [Written rx is in Copy on [employer].]

## 2021-08-23 ENCOUNTER — Encounter: Payer: Self-pay | Admitting: Family Medicine

## 2021-08-27 NOTE — Telephone Encounter (Signed)
Spoke with Ivin Booty of [place] [facility] asking about info needed to set up pt for home sleep study.  Says she will fax the form and a list of info needed and the number to fax it back.

## 2021-08-27 NOTE — Telephone Encounter (Signed)
Lattie Haw please call below # to try and get home sleep study set up for patient.

## 2021-09-03 ENCOUNTER — Telehealth: Payer: Self-pay | Admitting: Family Medicine

## 2021-09-03 NOTE — Telephone Encounter (Signed)
Mrs. Reisz called in and stated that [facility] stated that they have not received anything about [employer]. [employer] diabetic shoes and they have sent it multiple times. She has been trying since July and have not heard anything.  2nd- they discussed about about a at home sleep study. She found a sleep home study in [place]  [phone] ask for Ivin Booty she is the coordinator at the location. They need a referral to be sent to them.

## 2021-09-04 NOTE — Telephone Encounter (Addendum)
Regarding diabetic shoes - see phone note from 12/13 - we were awaiting new order from DME supply store. I assume we still haven't received them as I have not received anything yet.   Regarding home sleep study - see recent mychart message dated 12/24- we're still awaiting form and haven't received that I know of.

## 2021-09-10 NOTE — Telephone Encounter (Signed)
I still have not received forms from [place], [place] for DM shoes or [place] for home sleep study.    I spoke with Meagan at [facility] again requesting form to be faxed.  Says she will fax it now.  Lvm at [facility] for Ivin Booty asking her to call back.  Need to request form for home sleep study be faxed to [phone].

## 2021-09-12 ENCOUNTER — Other Ambulatory Visit: Payer: Self-pay | Admitting: Family Medicine

## 2021-09-15 NOTE — Telephone Encounter (Signed)
Received faxed DM shoe order and home sleep study order.  Placed forms in Dr. Synthia Innocent box.

## 2021-09-16 NOTE — Telephone Encounter (Signed)
Megan with [facility] called checking on the status of pt Certified Physician Form for diabetic shoes. Please advise.

## 2021-09-16 NOTE — Telephone Encounter (Signed)
Faxed DM shoe form to [facility] at [phone].   Spoke with Meagan informing her info was faxed.  Confirms she received everything and will submit form for processing.

## 2021-09-16 NOTE — Telephone Encounter (Signed)
Faxed DM shoe form to [facility] at [phone].

## 2021-09-16 NOTE — Telephone Encounter (Addendum)
Forms received and filled out and placed in Lisa's box.  Sleep study will need patient insurance demographic information printed. They are also requesting pre-authorization information. As I don't normally order sleep studies, he may still need to see a specialist if this is declined.

## 2021-09-17 NOTE — Telephone Encounter (Signed)
Spoke with [employer] M[employer] to do precert for home sleep study.  Told precert not req, call ref #: [id number].  Faxed form and info to [place] at [phone].

## 2021-09-19 ENCOUNTER — Telehealth: Payer: Self-pay | Admitting: Family Medicine

## 2021-09-19 NOTE — Telephone Encounter (Addendum)
Spoke with pt asking about chest pain. Says it felt moe like he ran 10 miles and couldn't catch his breath vs chest pain. States his nose stops up when lying down so he tried sitting up in recliner. Has not had any more issues since putting on CPAP.  Pt was told the sleep study is 6 hrs for 2 nights.   I relayed Dr. Synthia Innocent message.  Pt verbalizes understanding and say she will contact sleep ctr to set up study at the lab.

## 2021-09-19 NOTE — Telephone Encounter (Signed)
What did chest pain feel like? Any further chest pain since then? What is the home sleep test protocol - why do they need 2 nights?  I don't recommend he do it tonight if he had chest pain overnight, would recommend they instead do the sleep study in the sleep lab.

## 2021-09-19 NOTE — Telephone Encounter (Signed)
Pt wife called stating that Dr Darnell Level put pt on a Sleep Study. Pt wife stated that 2hrs into the Sleep Study pt was not able to breathe and chest was hurting. Pt wife stated that pt went back on his CPAP at 4:07 this morning. Pt wife is asking should pt go back on the Sleep Study tonight. Please advise.

## 2021-09-26 NOTE — Telephone Encounter (Signed)
Spoke with wife at her OV today.  Pt decided to complete home sleep study, did better when he rested in recliner the next night, no further symptoms since he's restarted regular CPAP use.

## 2021-10-22 ENCOUNTER — Encounter: Payer: Self-pay | Admitting: Family Medicine

## 2021-10-22 DIAGNOSIS — G4733 Obstructive sleep apnea (adult) (pediatric): Secondary | ICD-10-CM

## 2021-10-29 NOTE — Telephone Encounter (Signed)
Reviewed sleep study in chart 10/01/2021.  ?Home sleep apnea test 10/2021 - AHI 20.9/hr, lowest O2 sat 79% with average of 92%, demonstrating moderate OSA/hypopnea syndrome, rec CPAP therapy, consider return to sleep lab for CPAP titration study.  ? ?Plz check with pt to see where this needs to be sent for insurance to cover CPAP.  ?

## 2021-11-03 NOTE — Telephone Encounter (Addendum)
Faxed sleep study and written rx for new CPAP to [place], [place] at [phone].  ?

## 2021-11-04 ENCOUNTER — Other Ambulatory Visit: Payer: Self-pay | Admitting: *Deleted

## 2021-11-04 DIAGNOSIS — Z87891 Personal history of nicotine dependence: Secondary | ICD-10-CM

## 2021-11-06 ENCOUNTER — Telehealth: Payer: Self-pay

## 2021-11-06 DIAGNOSIS — G4733 Obstructive sleep apnea (adult) (pediatric): Secondary | ICD-10-CM

## 2021-11-06 NOTE — Telephone Encounter (Signed)
Received order from [facility], [place]) for CPAP and supplies.  Placed form in Dr. Synthia Innocent box.  ?

## 2021-11-07 NOTE — Telephone Encounter (Signed)
Faxed form to [facility] at [phone]. ?

## 2021-11-07 NOTE — Telephone Encounter (Signed)
Filled and in Lisa's box 

## 2021-11-14 LAB — HM DIABETES EYE EXAM

## 2021-12-04 ENCOUNTER — Telehealth: Payer: Self-pay

## 2021-12-04 NOTE — Progress Notes (Signed)
? ? ?Chronic Care Management ?Pharmacy Assistant  ? ?Name: William Hebert  MRN: [id number] DOB: 1946/07/27 ? ?Reason for Encounter: CCM (Diabetes Disease State) ?  ?Recent office visits:  ?10/22/21 Ria Bush, MD Message: Ordered: RX new CPAP ?09/03/21 Ria Bush, MD Telephone: Orders: Sleep Study ?07/18/21 [facility] Abnormal Labs "cholesterol levels were a bit elevated - continue crestor, limit added sugars and simple carbs in diet. A1c was stable at 6.5%". No med changes.  ? ?Recent consult visits:  ?11/04/21 Orders: CT Chest ?07/23/21 Susa Day (Orthopaedic Surgery): Pain in lumbar spine; No other information.  ? ?Hospital visits:  ?None in previous 6 months ? ?Medications: ?Outpatient Encounter Medications as of 12/04/2021  ?Medication Sig  ? Accu-Chek FastClix Lancets MISC Use as instructed to check blood sugar once daily.  ? acetaminophen (TYLENOL) 500 MG tablet Take 1,000 mg by mouth every 6 (six) hours as needed for moderate pain or mild pain.  ? albuterol (PROVENTIL HFA;VENTOLIN HFA) 108 (90 Base) MCG/ACT inhaler Inhale 1-2 puffs into the lungs every 4 (four) hours as needed for wheezing or shortness of breath.  ? Ascorbic Acid (VITAMIN C PO) Take 1,000 mg by mouth daily. Super C  ? aspirin EC 81 MG tablet Take 1 tablet (81 mg total) by mouth daily. Day after surgery  ? B Complex-C (SUPER B COMPLEX PO) Take 1 tablet by mouth daily.  ? Blood Glucose Monitoring Suppl (ACCU-CHEK GUIDE) w/Device KIT Use as instructed to check blood sugar once daily.  ? cephALEXin (KEFLEX) 500 MG capsule Take 1 capsule (500 mg total) by mouth 4 (four) times daily. Starting 6 hours after scheduled surgery time  ? Cholecalciferol (VITAMIN D3) 1.25 MG (50000 UT) CAPS TAKE 1 CAPSULE BY MOUTH EVERY FRIDAY IN THE MORNING  ? docusate sodium (COLACE) 100 MG capsule Take 1 capsule (100 mg total) by mouth 2 (two) times daily as needed for mild constipation.  ? furosemide (LASIX) 40 MG tablet Take 40 mg by mouth daily.  ? glucose  blood (ACCU-CHEK GUIDE) test strip Use as instructed to check blood sugar once daily.  ? glucose blood (ACCU-CHEK GUIDE) test strip Use as instructed  ? labetalol (NORMODYNE) 200 MG tablet TAKE 1 TABLET BY MOUTH DAILY  ? metFORMIN (GLUCOPHAGE) 500 MG tablet TAKE 1 TABLET BY MOUTH  DAILY WITH BREAKFAST  ? Multiple Vitamins-Minerals (MULTIVITAMIN ADULT PO) Take 1 tablet by mouth daily. MEN'S 50+ MULTIVITAMIN.  ? NIFEdipine (PROCARDIA XL/NIFEDICAL XL) 60 MG 24 hr tablet TAKE 1 TABLET BY MOUTH DAILY  ? omeprazole (PRILOSEC) 40 MG capsule Take 1 capsule (40 mg total) by mouth daily.  ? polyethylene glycol (MIRALAX / GLYCOLAX) 17 g packet Take 17 g by mouth daily.  ? potassium chloride SA (KLOR-CON M) 20 MEQ tablet TAKE 1 TABLET BY MOUTH DAILY  ? rosuvastatin (CRESTOR) 20 MG tablet TAKE 1 TABLET BY MOUTH  DAILY  ? valsartan (DIOVAN) 80 MG tablet TAKE 1 TABLET BY MOUTH  DAILY  ? Zinc 100 MG TABS Take 1 tablet (100 mg total) by mouth daily.  ? ?No facility-administered encounter medications on file as of 12/04/2021.  ? ?Recent Relevant Labs: ?Lab Results  ?Component Value Date/Time  ? HGBA1C 6.5 (H) 07/18/2021 03:39 PM  ? HGBA1C 6.7 (H) 01/08/2021 04:18 PM  ?  ?Kidney Function ?Lab Results  ?Component Value Date/Time  ? CREATININE 1.24 07/18/2021 03:39 PM  ? CREATININE 1.67 (H) 02/21/2021 10:36 AM  ? CREATININE 1.36 01/08/2021 04:18 PM  ? CREATININE 1.37 (H) 06/03/2018 03:47  PM  ? GFR 51.72 (L) 01/08/2021 04:18 PM  ? GFRNONAA 43 (L) 02/21/2021 10:36 AM  ? GFRNONAA 72 09/10/2015 02:46 PM  ? GFRAA >60 07/06/2017 02:42 PM  ? GFRAA 84 09/10/2015 02:46 PM  ? ? ?Attempted contact with patient 3 times on 04/07, 04/10 and 04/11. Unsuccessful outreach. Will atttempt contact next month. ? ?Current antihyperglycemic regimen:  ?Metformin 500 mg - 1 tablet daily before breakfast ?   ?Adherence Review: ?Is the patient currently on a STATIN medication? Yes ?Is the patient currently on ACE/ARB medication? Yes ?Does the patient have >5 day  gap between last estimated fill dates? No ? ?Care Gaps: ?Annual wellness visit in last year? Yes 07/18/2021 ?Most recent A1C reading: 6.5 on 07/18/2021 ?Most Recent BP reading: 140/80 on 07/18/2021 ? ?Last eye exam / retinopathy screening: 06/10/2020 ?Last diabetic foot exam: 03/13/2020 ? ?Star Rating Drugs:  ?Medication:  Last Fill: Day Supply ?Valsartan 80 mg 10/06/2021 90 ?Rosuvastatin 20 mg 10/06/2021 90  ?Metformin 500 mg 10/09/2021 90  ? ?No appointments scheduled within the next 30 days. ? ?Charlene Brooke, CPP notified ? ?Marijean Niemann, RMA ?Clinical Pharmacy Assistant ?[phone] ? ? ?

## 2021-12-16 ENCOUNTER — Encounter: Payer: Self-pay | Admitting: Family Medicine

## 2021-12-16 ENCOUNTER — Telehealth: Payer: Self-pay

## 2021-12-16 DIAGNOSIS — E113293 Type 2 diabetes mellitus with mild nonproliferative diabetic retinopathy without macular edema, bilateral: Secondary | ICD-10-CM | POA: Insufficient documentation

## 2021-12-16 MED ORDER — FUROSEMIDE 40 MG PO TABS: 40.0000 mg | ORAL_TABLET | Freq: Every day | ORAL | 2 refills | Status: DC

## 2021-12-16 NOTE — Telephone Encounter (Signed)
E-scribed refill 

## 2022-01-02 ENCOUNTER — Telehealth: Payer: Self-pay

## 2022-01-02 NOTE — Telephone Encounter (Signed)
Received faxed form from [facility] for CPAP use.  Placed form in Dr. Synthia Innocent box.  ?

## 2022-01-06 NOTE — Telephone Encounter (Signed)
Faxed form, then placed to be scanned.  ?

## 2022-01-06 NOTE — Telephone Encounter (Addendum)
CPAP compliance report reviewed, signed and in Lisa's box. ? ?Greater than 4-hour use for 34 out of 36 nights reviewed.  94% compliance rate.  Patient regularly uses and benefits from CPAP machine. ?

## 2022-01-07 ENCOUNTER — Telehealth: Payer: Self-pay

## 2022-01-07 NOTE — Progress Notes (Signed)
  Chronic Care Management Pharmacy Assistant   Name: William Hebert  MRN: [id number] DOB: 02/27/1946  Reason for Encounter: CCM (Diabetes Disease State)   Recent office visits:  None since last CCM contact  Recent consult visits:  None since last CCM contact  Hospital visits:  None in previous 6 months  Medications: Outpatient Encounter Medications as of 01/07/2022  Medication Sig   Accu-Chek FastClix Lancets MISC Use as instructed to check blood sugar once daily.   acetaminophen (TYLENOL) 500 MG tablet Take 1,000 mg by mouth every 6 (six) hours as needed for moderate pain or mild pain.   albuterol (PROVENTIL HFA;VENTOLIN HFA) 108 (90 Base) MCG/ACT inhaler Inhale 1-2 puffs into the lungs every 4 (four) hours as needed for wheezing or shortness of breath.   Ascorbic Acid (VITAMIN C PO) Take 1,000 mg by mouth daily. Super C   aspirin EC 81 MG tablet Take 1 tablet (81 mg total) by mouth daily. Day after surgery   B Complex-C (SUPER B COMPLEX PO) Take 1 tablet by mouth daily.   Blood Glucose Monitoring Suppl (ACCU-CHEK GUIDE) w/Device KIT Use as instructed to check blood sugar once daily.   cephALEXin (KEFLEX) 500 MG capsule Take 1 capsule (500 mg total) by mouth 4 (four) times daily. Starting 6 hours after scheduled surgery time   Cholecalciferol (VITAMIN D3) 1.25 MG (50000 UT) CAPS TAKE 1 CAPSULE BY MOUTH EVERY FRIDAY IN THE MORNING   docusate sodium (COLACE) 100 MG capsule Take 1 capsule (100 mg total) by mouth 2 (two) times daily as needed for mild constipation.   furosemide (LASIX) 40 MG tablet Take 1 tablet (40 mg total) by mouth daily.   glucose blood (ACCU-CHEK GUIDE) test strip Use as instructed to check blood sugar once daily.   glucose blood (ACCU-CHEK GUIDE) test strip Use as instructed   labetalol (NORMODYNE) 200 MG tablet TAKE 1 TABLET BY MOUTH DAILY   metFORMIN (GLUCOPHAGE) 500 MG tablet TAKE 1 TABLET BY MOUTH  DAILY WITH BREAKFAST   Multiple Vitamins-Minerals  (MULTIVITAMIN ADULT PO) Take 1 tablet by mouth daily. MEN'S 50+ MULTIVITAMIN.   NIFEdipine (PROCARDIA XL/NIFEDICAL XL) 60 MG 24 hr tablet TAKE 1 TABLET BY MOUTH DAILY   omeprazole (PRILOSEC) 40 MG capsule Take 1 capsule (40 mg total) by mouth daily.   polyethylene glycol (MIRALAX / GLYCOLAX) 17 g packet Take 17 g by mouth daily.   potassium chloride SA (KLOR-CON M) 20 MEQ tablet TAKE 1 TABLET BY MOUTH DAILY   rosuvastatin (CRESTOR) 20 MG tablet TAKE 1 TABLET BY MOUTH  DAILY   valsartan (DIOVAN) 80 MG tablet TAKE 1 TABLET BY MOUTH  DAILY   Zinc 100 MG TABS Take 1 tablet (100 mg total) by mouth daily.   No facility-administered encounter medications on file as of 01/07/2022.   Recent Relevant Labs: Lab Results  Component Value Date/Time   HGBA1C 6.5 (H) 07/18/2021 03:39 PM   HGBA1C 6.7 (H) 01/08/2021 04:18 PM    Kidney Function Lab Results  Component Value Date/Time   CREATININE 1.24 07/18/2021 03:39 PM   CREATININE 1.67 (H) 02/21/2021 10:36 AM   CREATININE 1.36 01/08/2021 04:18 PM   CREATININE 1.37 (H) 06/03/2018 03:47 PM   GFR 51.72 (L) 01/08/2021 04:18 PM   GFRNONAA 43 (L) 02/21/2021 10:36 AM   GFRNONAA 72 09/10/2015 02:46 PM   GFRAA >60 07/06/2017 02:42 PM   GFRAA 84 09/10/2015 02:46 PM    Contacted patient on 01/07/2022 to discuss diabetes disease state.     Current antihyperglycemic regimen:  Metformin 500 mg - 1 tablet daily before breakfast   Patient verbally confirms he is taking the above medications as directed. Yes  What diet changes have been made to improve diabetes control? No diet changes.   What recent interventions/DTPs have been made to improve glycemic control:  Continue current medication Continue avoiding sweet tea  Have there been any recent hospitalizations or ED visits since last visit with CPP? No  Patient denies hypoglycemic symptoms, including Pale, Sweaty, Shaky, Hungry, Nervous/irritable, and Vision changes  Patient denies hyperglycemic  symptoms, including blurry vision, excessive thirst, fatigue, polyuria, and weakness  How often are you checking your blood sugar? Patient has not been checking his blood glucose at home. I asked him to check his blood glucose and keep a log. I advised patient I would call him back on 05/19 for his log. Patient understood and agreed.   During the week, how often does your blood glucose drop below 70? Patient does not know.   Are you checking your feet daily/regularly? Yes  Adherence Review: Is the patient currently on a STATIN medication? Yes Is the patient currently on ACE/ARB medication? Yes Does the patient have >5 day gap between last estimated fill dates? No   Care Gaps: Annual wellness visit in last year? Yes 07/18/2021 Most recent A1C reading: 6.5 on 07/18/2021 Most Recent BP reading: 140/80 on 07/18/2021   Last eye exam / retinopathy screening: 06/10/2020 Last diabetic foot exam: 03/13/2020   Star Rating Drugs:  Medication:                Last Fill:         Day Supply Valsartan 80 mg          10/06/2021      90 Rosuvastatin 20 mg    10/06/2021      90         Metformin 500 mg       10/09/2021      90           No appointments scheduled within the next 30 days.   Charlene Brooke, CPP notified   Marijean Niemann, [place] Clinical Pharmacy Assistant [phone]

## 2022-01-15 ENCOUNTER — Telehealth: Payer: Self-pay

## 2022-01-15 DIAGNOSIS — E118 Type 2 diabetes mellitus with unspecified complications: Secondary | ICD-10-CM

## 2022-01-15 MED ORDER — ACCU-CHEK SOFTCLIX LANCETS MISC: 1 refills | Status: AC

## 2022-01-15 MED ORDER — ACCU-CHEK AVIVA PLUS VI STRP: ORAL_STRIP | 1 refills | Status: AC

## 2022-01-15 MED ORDER — METFORMIN HCL 500 MG PO TABS: 500.0000 mg | ORAL_TABLET | Freq: Every day | ORAL | 1 refills | Status: DC

## 2022-01-15 NOTE — Telephone Encounter (Signed)
E-scribed refills.  

## 2022-03-13 ENCOUNTER — Ambulatory Visit: Payer: Medicare Other

## 2022-03-19 ENCOUNTER — Ambulatory Visit
Admission: RE | Admit: 2022-03-19 | Discharge: 2022-03-19 | Disposition: A | Discharge status: Home / Self Care | Payer: Medicare Other | Source: Ambulatory Visit | Attending: Acute Care | Admitting: Acute Care

## 2022-03-19 ENCOUNTER — Telehealth: Payer: Self-pay

## 2022-03-19 DIAGNOSIS — Z87891 Personal history of nicotine dependence: Secondary | ICD-10-CM | POA: Insufficient documentation

## 2022-03-19 NOTE — Telephone Encounter (Signed)
Received faxed 46 Order form from [facility], [place] for CPAP supplies.    Placed form in Dr. Synthia Innocent box.

## 2022-03-23 ENCOUNTER — Telehealth: Payer: Self-pay | Admitting: Acute Care

## 2022-03-23 NOTE — Telephone Encounter (Signed)
Filled and in Lisa's box 

## 2022-03-23 NOTE — Telephone Encounter (Signed)
Call report from [place] at [place]:  IMPRESSION: 1. Lung-RADS 4A, suspicious. Follow up low-dose chest CT without contrast in 3 months (please use the following order, "CT CHEST LCS NODULE FOLLOW-UP W/O CM") is recommended. Alternatively, PET may be considered when there is a solid component 30m or larger. 6.2 mm non solid nodule identified on the previous study is now 8.6 mm with interval development of a 3.9 mm solid component. 2.  Emphysema (ICD10-J43.9) and Aortic Atherosclerosis (ICD10-170.0)

## 2022-03-24 NOTE — Telephone Encounter (Signed)
Faxed form to [employer] earlier this morning.

## 2022-03-25 ENCOUNTER — Other Ambulatory Visit: Payer: Self-pay | Admitting: Acute Care

## 2022-03-25 ENCOUNTER — Telehealth: Payer: Self-pay | Admitting: Acute Care

## 2022-03-25 DIAGNOSIS — R911 Solitary pulmonary nodule: Secondary | ICD-10-CM

## 2022-03-25 NOTE — Progress Notes (Signed)
See Telephone note dated 03/25/2022

## 2022-03-25 NOTE — Telephone Encounter (Signed)
I have called the patient with the results of his low dose CT Chest. There has been interval growth in the size of a ground-glass opacity in the posterior left upper lobe that measured 6.2 mm previously. This lesion has increased in size now measuring 8.6 mm with interval development of a 4.3 mm solid nodular component measuring 3.9 mm (image 163/3). I have reviewed the scan with Dr. Valeta Harms, and he feels we should PET scan the nodule now. I have explained this to the patient. He is in agreement with this plan.  Pt. Has moved to [facility]. He will drive up for the scan. We will need to do follow up visit to review the scan as a video visit. Langley Gauss, please watch to see when this is scheduled and schedule with either Dr. Valeta Harms of myself.   Dr. Danise Mina, I am adding you to this communication so you are aware of the results and the follow up scan order. Please reach out if you have any questions or concerns.   If PET scan is negative, we can return him to the screening population.   Thanks all!!

## 2022-03-26 NOTE — Telephone Encounter (Signed)
Video visit 04/09/22 at 4pm with Dr. Valeta Harms confirmed with patient

## 2022-03-26 NOTE — Telephone Encounter (Signed)
Called patient to schedule video visit following his PET scan. Patient states he had not received an appt for PET scan.  Noted the scheduling for PET was for 04/06/22 at 1:30pm. Patient states he cannot drive 4 hours and be there before 130pm and that he had requested an appt after 2pm.  He would like the PET scan time changed.  Will notify Rodena Piety [facility]) to reach out to patient to change appt. Will hold on scheduling the follow up video visit.

## 2022-04-04 ENCOUNTER — Other Ambulatory Visit: Payer: Self-pay | Admitting: Family Medicine

## 2022-04-06 ENCOUNTER — Encounter
Admission: RE | Admit: 2022-04-06 | Discharge: 2022-04-06 | Disposition: A | Discharge status: Home / Self Care | Payer: Medicare Other | Source: Ambulatory Visit | Attending: Acute Care | Admitting: Acute Care

## 2022-04-06 DIAGNOSIS — R911 Solitary pulmonary nodule: Secondary | ICD-10-CM | POA: Diagnosis present

## 2022-04-06 LAB — GLUCOSE, CAPILLARY: Glucose-Capillary: 124 mg/dL — ABNORMAL HIGH (ref 70–99)

## 2022-04-06 MED ORDER — FLUDEOXYGLUCOSE F - 18 (FDG) INJECTION
16.0000 | Freq: Once | INTRAVENOUS | Status: AC | PRN
  Administered 2022-04-06: 17.21 via INTRAVENOUS

## 2022-04-07 NOTE — Telephone Encounter (Signed)
Refill request Vitamin D 3 1.25 mg Last refill 04/29/21 #12/1 Last office visit 07/18/21

## 2022-04-09 ENCOUNTER — Institutional Professional Consult (permissible substitution): Payer: Medicare Other | Admitting: Pulmonary Disease

## 2022-04-09 ENCOUNTER — Telehealth (INDEPENDENT_AMBULATORY_CARE_PROVIDER_SITE_OTHER): Payer: Medicare Other | Admitting: Pulmonary Disease

## 2022-04-09 ENCOUNTER — Encounter: Payer: Self-pay | Admitting: Pulmonary Disease

## 2022-04-09 DIAGNOSIS — Z720 Tobacco use: Secondary | ICD-10-CM

## 2022-04-09 DIAGNOSIS — R918 Other nonspecific abnormal finding of lung field: Secondary | ICD-10-CM

## 2022-04-09 DIAGNOSIS — R911 Solitary pulmonary nodule: Secondary | ICD-10-CM | POA: Diagnosis not present

## 2022-04-09 NOTE — Progress Notes (Signed)
Virtual Visit via Video Note  I connected with William Hebert on 04/09/22 at  4:00 PM EDT by a video enabled telemedicine application and verified that I am speaking with the correct person using two identifiers.  Location: Patient: Home  Provider: Office    I discussed the limitations of evaluation and management by telemedicine and the availability of in person appointments. The patient expressed understanding and agreed to proceed.  History of Present Illness:  76 yo M, PMH seen in consult via video visit. PMH listed below. He was enrolled in our LDCT program and was found to have a RUL nodule. Subsolid lesion. Slowly changing over time. Last seen on ct in 2021. This CT shows stable image but it does appear more solid in nature in comparison. He was sent for PET scan that showed no significant uptake. But this likely due to fact the lesion is so small.   Past Medical History:  Diagnosis Date   AAA (abdominal aortic aneurysm) without rupture 07/10/2014   S/p endovascular repair 2012 with stent Kellie Simmering), yearly f/u   Arthritis    Cancer [facility])    skin cancer non melanioma   Chronic bronchitis ([place]) 11/2016 AND 10-2016   COPD (chronic obstructive pulmonary disease) ([place])    chronic bronchitis   GERD (gastroesophageal reflux disease)    Headache(784.0)    in 20's   Hepatitis    Carrier cant give blood gives a false positive   Hernia of abdominal wall    LARGE FROM BREAST BONE AREA TO BELLY BUTTON   History of irregular heartbeat    History of skin cancer 2014   HTN (hypertension)    Hyperlipidemia    Hypogonadism in male    prior on testosterone cream   Knee pain    Left   Morbidly obese (HCC)    Peripheral edema    feet   Pneumonia    PONV (postoperative nausea and vomiting)    PONV AFTER AAA REPAIR, ALL OTHER SURGERIES WENT OK   Posterior vitreous detachment 02/2018   Shortness of breath    WITH EXERTION   Sleep apnea    CPAP       Observations/Objective:  Seenon  video No acute distress  Assessment and Plan:  RUL Subsolid nodule  Tobacco abuse history  P: Discussed pros and cons of watchful waiting  Discussed most aggressive approach to consider bx now vs follow up imaging  Both wife and patient on the phone  They want to discuss among themselves and decide on follow up imaging or biopsy  I think either option is ok to consider  Follow Up Instructions:   I discussed the assessment and treatment plan with the patient. The patient was provided an opportunity to ask questions and all were answered. The patient agreed with the plan and demonstrated an understanding of the instructions.   The patient was advised to call back or seek an in-person evaluation if the symptoms worsen or if the condition fails to improve as anticipated.  I spent 46 minutes dedicated to the care of this patient on the date of this encounter to include pre-visit review of records, non-face-to-face time with the patient discussing conditions above, post visit ordering of testing, clinical documentation with the electronic health record, making appropriate referrals as documented, and communicating necessary findings to members of the patients care team.    Garner Nash, [facility] 04/09/2022 5:34 PM

## 2022-04-10 NOTE — Telephone Encounter (Signed)
Mychart message sent by pt's spouse: JAYDIN BONIFACE [facility] (supporting Garner Nash, DO) 11 hours ago (10:23 PM)    Dear Dr. Valeta Harms, This is Su Monks .[place]amberlain is so not into computers except to play games, so here I am.  We discussed his options and begrudgingly he has agreed to a repeat CT scan in three months with a follow up appointment with you at that time.  He watched his mother die young of cancer and has always told me he would do nothing if he got it.  Your description of treatment for this, should it be cancer, was so benign compared to what she went through- that he is more willing to do  the scan in three months with a second check with you.   Thank you so much for all the information you gave [place] today and for answering all of our questions.  We both enjoyed meeting you.     Su Monks [phone]    Dr. Valeta Harms, please advise.

## 2022-05-01 ENCOUNTER — Other Ambulatory Visit: Payer: Self-pay | Admitting: Cardiology

## 2022-05-30 ENCOUNTER — Encounter: Payer: Self-pay | Admitting: Family Medicine

## 2022-06-01 NOTE — Telephone Encounter (Signed)
Updated pt's med list, per pt's wife, Carlyon Shadow.

## 2022-06-04 ENCOUNTER — Encounter: Payer: Self-pay | Admitting: Family Medicine

## 2022-06-04 DIAGNOSIS — I4891 Unspecified atrial fibrillation: Secondary | ICD-10-CM | POA: Insufficient documentation

## 2022-06-04 DIAGNOSIS — I480 Paroxysmal atrial fibrillation: Secondary | ICD-10-CM | POA: Insufficient documentation

## 2022-06-23 ENCOUNTER — Telehealth: Payer: Self-pay | Admitting: Family Medicine

## 2022-06-23 ENCOUNTER — Encounter: Payer: Self-pay | Admitting: Family Medicine

## 2022-06-23 NOTE — Telephone Encounter (Signed)
Patient wife called in stating that Dr Darnell Level wants her to get patient (her husband ) scheduled for his physical asap. They travel and she said that she let him know that they would be in town on nov 20. Will it be okay to schedule patient on the 20th in the 4:00 slot since there is already 2 cpes scheduled that afternoon? I wanted to get the ok before scheduling.

## 2022-06-24 ENCOUNTER — Telehealth: Payer: Self-pay

## 2022-06-24 MED ORDER — FREESTYLE LIBRE 2 READER DEVI: 0 refills | Status: DC

## 2022-06-24 MED ORDER — FREESTYLE LIBRE 2 SENSOR MISC: 11 refills | Status: DC

## 2022-06-24 NOTE — Telephone Encounter (Signed)
Ok to schedule then. Thank you.

## 2022-06-25 ENCOUNTER — Telehealth: Payer: Self-pay

## 2022-06-25 NOTE — Telephone Encounter (Signed)
Prior auth started for [employer]. [name] Key: [id number] - PA Case ID: [id number] - Rx #: [id number] Waiting for determination.

## 2022-06-25 NOTE — Telephone Encounter (Signed)
Pt wife called in advise her that  pt CPE appointment as been schedule

## 2022-06-29 NOTE — Telephone Encounter (Signed)
Prior William Hebert has been denied for [employer] 2 Sensor. [name] Key: [id number] - PA Case ID: [id number] - Rx #: [id number]  Denial letter was received with the [facility] 2 reader device information.    Freestyle libre 2 continuous glucose monitor system (receiver, transmitter, and sensor) is denied for not meeting the prior authorization requirement(s). Product authorization requires the following: (1) One of the following: (A) You are being treated with insulin. (B) You have a history of problematic hypoglycemia with documentation of recurrent level 2 hypoglycemic events [glucose less than '54mg'$ /dl (3.0 mmol/L)] that persist despite multiple attempts to adjust medication(s) and/or modify the diabetes treatment plan. (C) You have a history of problematic hypoglycemia with documentation of a history of a level 3 hypoglycemic event [glucose less than '54mg'$ /dl (3.0 mmol/L)] characterized by altered mental and/or physical state requiring third-party assistance for treatment of hypoglycemia.

## 2022-06-29 NOTE — Telephone Encounter (Signed)
Fyi to Dr. G 

## 2022-06-29 NOTE — Telephone Encounter (Addendum)
Plz notify wife - continuous glucose monitor was denied because he's not on daily insulin injections.  They can pay out of pocket (max ~$70/month with discount through the company that makes the monitor) or continue with glucometer sticks.

## 2022-06-29 NOTE — Telephone Encounter (Signed)
Prior auth for [employer] 2 Reader device has been denied.  William Hebert Hearing - [place] Case ID: [id number] - Rx #: [id number] Freestyle libre 2 continuous glucose monitor system (receiver, transmitter, and sensor) is denied for not meeting the prior authorization requirement(s). Product authorization requires the following: (1) One of the following: (A) You are being treated with insulin. (B) You have a history of problematic hypoglycemia with documentation of recurrent level 2 hypoglycemic events [glucose less than '54mg'$ /dl (3.0 mmol/L)] that persist despite multiple attempts to adjust medication(s) and/or modify the diabetes treatment plan. (C) You have a history of problematic hypoglycemia with documentation of a history of a level 3 hypoglycemic event [glucose less than '54mg'$ /dl (3.0 mmol/L)] characterized by altered mental and/or physical state requiring third-party assistance for treatment of hypoglycemia.  Denial letter sent to scanning.

## 2022-06-30 LAB — HEPATIC FUNCTION PANEL
ALT: 17 U/L (ref 10–40)
AST: 16 (ref 14–40)
Alkaline Phosphatase: 74 (ref 25–125)
Bilirubin, Total: 0.3

## 2022-06-30 LAB — BASIC METABOLIC PANEL
BUN: 17 (ref 4–21)
Creatinine: 1.6 — AB (ref 0.6–1.3)
Glucose: 205
Potassium: 3.4 mEq/L — AB (ref 3.5–5.1)
Sodium: 142 (ref 137–147)

## 2022-06-30 NOTE — Telephone Encounter (Signed)
Left message on voicemail for patient's wife to call the office back. 

## 2022-07-01 LAB — CBC AND DIFFERENTIAL
Hemoglobin: 13.9 (ref 13.5–17.5)
Platelets: 187 10*3/uL (ref 150–400)
WBC: 6.9

## 2022-07-01 NOTE — Telephone Encounter (Signed)
Lvm asking pt/pt's wife, [name] (on dpr), to call back.  Need to relay Dr. Synthia Innocent message.

## 2022-07-02 NOTE — Telephone Encounter (Signed)
Spoke with pt relaying Dr. G's message. Pt verbalizes understanding.  

## 2022-07-03 ENCOUNTER — Other Ambulatory Visit: Payer: Self-pay | Admitting: Family Medicine

## 2022-07-03 DIAGNOSIS — E118 Type 2 diabetes mellitus with unspecified complications: Secondary | ICD-10-CM

## 2022-07-07 ENCOUNTER — Encounter: Payer: Self-pay | Admitting: Family Medicine

## 2022-07-14 ENCOUNTER — Telehealth: Payer: Self-pay | Admitting: Family Medicine

## 2022-07-14 ENCOUNTER — Other Ambulatory Visit: Payer: Medicare Other

## 2022-07-14 NOTE — Telephone Encounter (Signed)
LVM for pt to rtn my call to schedule AWV with [facility] call back # [phone] 

## 2022-07-19 ENCOUNTER — Encounter: Payer: Self-pay | Admitting: Family Medicine

## 2022-07-20 ENCOUNTER — Ambulatory Visit (INDEPENDENT_AMBULATORY_CARE_PROVIDER_SITE_OTHER): Payer: Medicare Other | Admitting: Family Medicine

## 2022-07-20 ENCOUNTER — Encounter: Payer: Self-pay | Admitting: Family Medicine

## 2022-07-20 ENCOUNTER — Ambulatory Visit
Admission: RE | Admit: 2022-07-20 | Discharge: 2022-07-20 | Disposition: A | Discharge status: Home / Self Care | Payer: Medicare Other | Source: Ambulatory Visit | Attending: Pulmonary Disease | Admitting: Pulmonary Disease

## 2022-07-20 VITALS — BP 140/80 | HR 80 | Ht 71.0 in | Wt 340.0 lb

## 2022-07-20 DIAGNOSIS — Z87891 Personal history of nicotine dependence: Secondary | ICD-10-CM

## 2022-07-20 DIAGNOSIS — G629 Polyneuropathy, unspecified: Secondary | ICD-10-CM

## 2022-07-20 DIAGNOSIS — E785 Hyperlipidemia, unspecified: Secondary | ICD-10-CM | POA: Diagnosis not present

## 2022-07-20 DIAGNOSIS — R911 Solitary pulmonary nodule: Secondary | ICD-10-CM | POA: Insufficient documentation

## 2022-07-20 DIAGNOSIS — E113293 Type 2 diabetes mellitus with mild nonproliferative diabetic retinopathy without macular edema, bilateral: Secondary | ICD-10-CM

## 2022-07-20 DIAGNOSIS — E559 Vitamin D deficiency, unspecified: Secondary | ICD-10-CM

## 2022-07-20 DIAGNOSIS — G4733 Obstructive sleep apnea (adult) (pediatric): Secondary | ICD-10-CM

## 2022-07-20 DIAGNOSIS — Z125 Encounter for screening for malignant neoplasm of prostate: Secondary | ICD-10-CM | POA: Diagnosis not present

## 2022-07-20 DIAGNOSIS — I714 Abdominal aortic aneurysm, without rupture, unspecified: Secondary | ICD-10-CM

## 2022-07-20 DIAGNOSIS — I10 Essential (primary) hypertension: Secondary | ICD-10-CM

## 2022-07-20 DIAGNOSIS — J449 Chronic obstructive pulmonary disease, unspecified: Secondary | ICD-10-CM

## 2022-07-20 DIAGNOSIS — Z0000 Encounter for general adult medical examination without abnormal findings: Secondary | ICD-10-CM

## 2022-07-20 DIAGNOSIS — I480 Paroxysmal atrial fibrillation: Secondary | ICD-10-CM

## 2022-07-20 DIAGNOSIS — E1169 Type 2 diabetes mellitus with other specified complication: Secondary | ICD-10-CM | POA: Diagnosis not present

## 2022-07-20 DIAGNOSIS — Z8679 Personal history of other diseases of the circulatory system: Secondary | ICD-10-CM

## 2022-07-20 DIAGNOSIS — Z7189 Other specified counseling: Secondary | ICD-10-CM

## 2022-07-20 DIAGNOSIS — I5032 Chronic diastolic (congestive) heart failure: Secondary | ICD-10-CM

## 2022-07-20 NOTE — Progress Notes (Unsigned)
Patient ID: William Hebert, male    DOB: 1946-07-12, 76 y.o.   MRN: [id number]  This visit was conducted in person.  BP (!) 140/80   Pulse 80   Ht _0  (1.803 m)   Wt (!) 340 lb (154.2 kg)   SpO2 93%   BMI 47.42 kg/m    CC: CPE/AWV Subjective:   HPI: William Hebert is a 76 y.o. male presenting on 07/20/2022 for medicare visit (Medicare visit , pt has no other concerns , labs )   Did not see health advisor.   Hearing Screening   _1  _2  _3  _4   Right ear 40 40 40 40  Left ear 40 40  40  Eye exam - 1 month ago - new glasses but he doesn't wear them.  Sees new eye doctor at [facility]  [place] Office Visit from 07/20/2022 in [place] at [place]  PHQ-2 Total Score 0          07/20/2022    3:51 PM 07/18/2021    2:25 PM 06/26/2020    3:37 PM 04/03/2019    3:37 PM 03/16/2018   12:36 PM  Fall Risk   Falls in the past year? 0 0 0 0 No  Number falls in past yr:   0    Injury with Fall?   0    Risk for fall due to :   Medication side effect Medication side effect   Follow up Falls evaluation completed  Falls evaluation completed;Falls prevention discussed Falls evaluation completed;Falls prevention discussed    R RTC repair 2022 ([place]).   DM - doesn't regularly check sugars - last checked a few days ago 125 fasting. He avoids sweet tea. Drinks mio water too.   Recently found RUL subsolid nodule - just completed super lung CT scan today. Seeing Dr Valeta Harms pulmonology.   Found to have afib 05/2022, now on eliquis in addition to labetalol and regular aspirin. Discussed rate control vs rhythm control - he's declined cardioversion.   Preventative: COLONOSCOPY WITH PROPOFOL 02/23/2017 - 4 TA polyps, rpt 3-5 yrs ([place]) Prostate cancer screening - yearly PSA.  Lung cancer screening - see above.  Flu shot - not done  COVID vaccine Pfizer 05/2020, 07/2020, no boosters Td 2015  Pneumovax 2017, prevnar-13 2015  Shingrix - to consider  Advanced  directive discussion - would want daughter William Hebert to be HCPOA - also wife. Packet previously provided - encouraged he work on this.  Seat belt use discussed  Sunscreen use discussed. No changing moles on skin.  Ex smoker - quit ~2007. Prior smoked 2 ppd. 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 Interim medical history since our last visit reviewed. Allergies and medications reviewed and updated. Outpatient Medications Prior to Visit  Medication Sig Dispense Refill   Accu-Chek Softclix Lancets lancets Check blood sugar once daily 100 each 1   acetaminophen (TYLENOL) 500 MG tablet Take 1,000 mg by mouth every 6 (six) hours as needed for moderate pain or mild pain.     apixaban (ELIQUIS) 5 MG TABS tablet  Take 5 mg by mouth 2 (two) times daily.     Ascorbic Acid (VITAMIN C PO) Take 1,000 mg by mouth daily. Super C     aspirin EC 81 MG tablet Take 1 tablet (81 mg total) by mouth daily. Day after surgery 30 tablet 1   B Complex-C (SUPER B COMPLEX PO) Take 1 tablet by mouth daily.     Blood Glucose Monitoring Suppl (ACCU-CHEK GUIDE) w/Device KIT Use as instructed to check blood sugar once daily. 1 kit 0   Cholecalciferol (VITAMIN D3) 1.25 MG (50000 UT) CAPS TAKE 1 CAPSULE BY MOUTH EVERY FRIDAY IN THE MORNING 12 capsule 1   furosemide (LASIX) 40 MG tablet Take 1 tablet (40 mg total) by mouth daily. 90 tablet 2   glucose blood (ACCU-CHEK AVIVA PLUS) test strip Use to check blood sugar once daily 100 each 1   labetalol (NORMODYNE) 200 MG tablet TAKE 1 TABLET BY MOUTH DAILY 90 tablet 3   metFORMIN (GLUCOPHAGE)  500 MG tablet TAKE 1 TABLET BY MOUTH DAILY  WITH BREAKFAST 90 tablet 0   Multiple Vitamins-Minerals (MULTIVITAMIN ADULT PO) Take 1 tablet by mouth daily. MEN'S 50+ MULTIVITAMIN.     NIFEdipine (PROCARDIA XL/NIFEDICAL XL) 60 MG 24 hr tablet TAKE 1 TABLET BY MOUTH DAILY 90 tablet 3   omeprazole (PRILOSEC) 40 MG capsule Take 1 capsule (40 mg total) by mouth daily. 90 capsule 3   potassium chloride SA (KLOR-CON M) 20 MEQ tablet TAKE 1 TABLET BY MOUTH DAILY 90 tablet 3   rosuvastatin (CRESTOR) 20 MG tablet TAKE 1 TABLET BY MOUTH  DAILY 90 tablet 3   valsartan (DIOVAN) 80 MG tablet Take 80 mg by mouth in the morning and at bedtime.     Zinc 100 MG TABS Take 1 tablet (100 mg total) by mouth daily. 90 tablet 3   albuterol (PROVENTIL HFA;VENTOLIN HFA) 108 (90 Base) MCG/ACT inhaler Inhale 1-2 puffs into the lungs every 4 (four) hours as needed for wheezing or shortness of breath.     cephALEXin (KEFLEX) 500 MG capsule Take 1 capsule (500 mg total) by mouth 4 (four) times daily. Starting 6 hours after scheduled surgery time 6 capsule 1   Continuous Blood Gluc Receiver (FREESTYLE LIBRE 2 READER) DEVI Use as directed to check sugars daily E11.69 1 each 0   Continuous Blood Gluc Sensor (FREESTYLE LIBRE 2 SENSOR) MISC Use as directed, change every 2 weeks 2 each 11   docusate sodium (COLACE) 100 MG capsule Take 1 capsule (100 mg total) by mouth 2 (two) times daily as needed for mild constipation. 30 capsule 1   polyethylene glycol (MIRALAX / GLYCOLAX) 17 g packet Take 17 g by mouth daily. 14 each 0   No facility-administered medications prior to visit.     Per HPI unless specifically indicated in ROS section below Review of Systems  Constitutional:  Negative for activity change, appetite change, chills, fatigue, fever and unexpected weight change.  HENT:  Negative for hearing loss.   Eyes:  Negative for visual disturbance.  Respiratory:  Positive for cough and shortness of breath. Negative for chest  tightness and wheezing.   Cardiovascular:  Positive for leg swelling. Negative for chest pain and palpitations.  Gastrointestinal:  Negative for abdominal distention, abdominal pain, blood in stool, constipation, diarrhea, nausea and vomiting.  Genitourinary:  Negative for difficulty urinating and hematuria.  Musculoskeletal:  Negative for arthralgias, myalgias and neck pain.  Skin:  Negative for rash.  Neurological:  Negative for dizziness,  seizures, syncope and headaches.  Hematological:  Negative for adenopathy. Does not bruise/bleed easily.  Psychiatric/Behavioral:  Negative for dysphoric mood. The patient is not nervous/anxious.     Objective:  BP (!) 140/80   Pulse 80   Ht _0  (1.803 m)   Wt (!) 340 lb (154.2 kg)   SpO2 93%   BMI 47.42 kg/m   Wt Readings from Last 3 Encounters:  07/20/22 (!) 340 lb (154.2 kg)  04/02/22 (!) 339 lb (153.8 kg)  03/19/22 (!) 330 lb (149.7 kg)      Physical Exam Vitals and nursing note reviewed.  Constitutional:      General: He is not in acute distress.    Appearance: Normal appearance. He is well-developed. He is obese. He is not ill-appearing.  HENT:     Head: Normocephalic and atraumatic.     Right Ear: Hearing, tympanic membrane, ear canal and external ear normal.     Left Ear: Hearing, tympanic membrane, ear canal and external ear normal.     Nose: Nose normal.     Mouth/Throat:     Mouth: Mucous membranes are moist.     Pharynx: Oropharynx is clear. No oropharyngeal exudate or posterior oropharyngeal erythema.  Eyes:     General: No scleral icterus.    Extraocular Movements: Extraocular movements intact.     Conjunctiva/sclera: Conjunctivae normal.     Pupils: Pupils are equal, round, and reactive to light.  Neck:     Thyroid: No thyroid mass or thyromegaly.     Vascular: No carotid bruit.  Cardiovascular:     Rate and Rhythm: Normal rate. Rhythm irregularly irregular.     Pulses: Normal pulses.          Radial pulses are  2+ on the right side and 2+ on the left side.     Heart sounds: Normal heart sounds. No murmur heard. Pulmonary:     Effort: Pulmonary effort is normal. No respiratory distress.     Breath sounds: Normal breath sounds. No wheezing, rhonchi or rales.  Abdominal:     General: Bowel sounds are normal. There is no distension.     Palpations: Abdomen is soft. There is no mass.     Tenderness: There is no abdominal tenderness. There is no guarding or rebound.     Hernia: No hernia is present.  Musculoskeletal:        General: Normal range of motion.     Cervical back: Normal range of motion and neck supple.     Right lower leg: No edema.     Left lower leg: No edema.  Lymphadenopathy:     Cervical: No cervical adenopathy.  Skin:    General: Skin is warm and dry.     Findings: No rash.  Neurological:     General: No focal deficit present.     Mental Status: He is alert and oriented to person, place, and time.     Comments:  Recall 3/3 Calculation 5/5 serial 3s   Psychiatric:        Mood and Affect: Mood normal.        Behavior: Behavior normal.        Thought Content: Thought content normal.        Judgment: Judgment normal.       Results for orders placed or performed in visit on 07/07/22  CBC and differential  Result Value Ref Range   Hemoglobin 13.9 13.5 - 17.5   Platelets 187 150 -  400 K/uL   WBC 6.9   Basic metabolic panel  Result Value Ref Range   Glucose 205    BUN 17 4 - 21   Creatinine 1.6 (A) 0.6 - 1.3   Potassium 3.4 (A) 3.5 - 5.1 mEq/L   Sodium 142 137 - 147  Hepatic function panel  Result Value Ref Range   Alkaline Phosphatase 74 25 - 125   ALT 17 10 - 40 U/L   AST 16 14 - 40   Bilirubin, Total 0.3     Assessment & Plan:   Problem List Items Addressed This Visit     Health maintenance examination (Chronic)    Preventative protocols reviewed and updated unless pt declined. Discussed healthy diet and lifestyle.       Advanced care  planning/counseling discussion (Chronic)    Advanced directive discussion - would want daughter William Hebert to be HCPOA - also wife. Packet previously provided - encouraged he work on this.       Medicare annual wellness visit, subsequent - Primary (Chronic)    I have personally reviewed the Medicare Annual Wellness questionnaire and have noted 1. The patient's medical and social history 2. Their use of alcohol, tobacco or illicit drugs 3. Their current medications and supplements 4. The patient's functional ability including ADL's, fall risks, home safety risks and hearing or visual impairment. Cognitive function has been assessed and addressed as indicated.  5. Diet and physical activity 6. Evidence for depression or mood disorders The patients weight, height, BMI have been recorded in the chart. I have made referrals, counseling and provided education to the patient based on review of the above and I have provided the pt with a written personalized care plan for preventive services. Provider list updated.. See scanned questionairre as needed for further documentation. Reviewed preventative protocols and updated unless pt declined.       Ex-smoker    Remains abstinent.       Obstructive sleep apnea    He has been on CPAP.       Dyslipidemia associated with type 2 diabetes mellitus ([place])    Update FLP on crestor 54m daily.  The 10-year ASCVD risk score (Arnett DK, et al., 2019) is: 51.1%   Values used to calculate the score:     Age: [age]years     Sex: Male     Is Non-Hispanic African American: No     Diabetic: Yes     Tobacco smoker: No     Systolic Blood Pressure: 1628mmHg     Is BP treated: Yes     HDL Cholesterol: 33 mg/dL     Total Cholesterol: 138 mg/dL       Relevant Orders   Lipid panel   Comprehensive metabolic panel   Hypertension    Chronic, adequate on current regimen.       S/P AAA (abdominal aortic aneurysm) repair   Morbid obesity with BMI of  40.0-44.9, adult ([place]    Encouraged healthy diet and lifestyle choices to affect sustainable weight loss.       AAA (abdominal [place]) without rupture [place]    [place]       Vitamin D deficiency    On vit D 50000 IU weekly.       Relevant Orders   VITAMIN D 25 Hydroxy (Vit-D Deficiency, Fractures)   Type 2 diabetes mellitus with other specified complication (HCC)    H/o diarrhea to gabapentin  and severe diarrhea to Multaq - however stable period on low dose metformin IR.  If increased dose needed, he would be interested in trying XR formulation.       Relevant Orders   Hemoglobin A1c   Microalbumin / creatinine urine ratio   Diastolic dysfunction with chronic heart failure (HCC)   COPD (chronic obstructive pulmonary disease) ([place])    Largely asymptomatic from respiratory standpoint      Small fiber neuropathy    Followed by podiatrist at the [place].       Mild nonproliferative diabetic retinopathy of both eyes without macular edema ([place])    Followed by [facility] doctor at the [facility]       Paroxysmal atrial fibrillation [facility])    Reviewed new diagnosis from 05/2022 with patient. He is now on daily eliquis in addition to aspirin 11m daily. Also continues labetalol 2059mdaily for rate control through cardiology. He declined cardioversion. Reviewed importance of continued AC.       Other Visit Diagnoses     Special screening for malignant neoplasm of prostate       Relevant Orders   PSA        No orders of the defined types were placed in this encounter.  Orders Placed This Encounter  Procedures   VITAMIN D 25 Hydroxy (Vit-D Deficiency, Fractures)   Lipid panel   Comprehensive metabolic panel   Hemoglobin A1c   PSA   Microalbumin / creatinine urine ratio    Patient instructions: [place] you [place] and we will send your records.  If interested, check with pharmacy about new 2 shot shingles series  (shingrix).  Work on coProduction manager Continue current medicines.  Return as needed or in 6 months for diabetes follow up visit  Good to see you today.   Follow up plan: Return in about 6 months (around 01/18/2023) for follow up visit.  JaRia BushMD

## 2022-07-20 NOTE — Assessment & Plan Note (Signed)
Preventative protocols reviewed and updated unless pt declined. Discussed healthy diet and lifestyle.  

## 2022-07-20 NOTE — Assessment & Plan Note (Signed)

## 2022-07-20 NOTE — Patient Instructions (Addendum)
[place].  If interested, check with pharmacy about new 2 shot shingles series (shingrix).  Work on Production manager.  Continue current medicines.  Return as needed or in 6 months for diabetes follow up visit  Good to see you today.   Health Maintenance After Age 74 After age 34, you are at a higher risk for certain long-term diseases and infections as well as injuries from falls. Falls are a major cause of broken bones and head injuries in people who are older than age 17. Getting regular preventive care can help to keep you healthy and well. Preventive care includes getting regular testing and making lifestyle changes as recommended by your health care provider. Talk with your health care provider about: Which screenings and tests you should have. A screening is a test that checks for a disease when you have no symptoms. A diet and exercise plan that is right for you. What should I know about screenings and tests to prevent falls? Screening and testing are the best ways to find a health problem early. Early diagnosis and treatment give you the best chance of managing medical conditions that are common after age 24. Certain conditions and lifestyle choices may make you more likely to have a fall. Your health care provider may recommend: Regular vision checks. Poor vision and conditions such as cataracts can make you more likely to have a fall. If you wear glasses, make sure to get your prescription updated if your vision changes. Medicine review. Work with your health care provider to regularly review all of the medicines you are taking, including over-the-counter medicines. Ask your health care provider about any side effects that may make you more likely to have a fall. Tell your health care provider if any medicines that you take make you feel dizzy or sleepy. Strength and balance checks. Your health care  provider may recommend certain tests to check your strength and balance while standing, walking, or changing positions. Foot health exam. Foot pain and numbness, as well as not wearing proper footwear, can make you more likely to have a fall. Screenings, including: Osteoporosis screening. Osteoporosis is a condition that causes the bones to get weaker and break more easily. Blood pressure screening. Blood pressure changes and medicines to control blood pressure can make you feel dizzy. Depression screening. You may be more likely to have a fall if you have a fear of falling, feel depressed, or feel unable to do activities that you used to do. Alcohol use screening. Using too much alcohol can affect your balance and may make you more likely to have a fall. Follow these instructions at home: Lifestyle Do not drink alcohol if: Your health care provider tells you not to drink. If you drink alcohol: Limit how much you have to: 0-1 drink a day for women. 0-2 drinks a day for men. Know how much alcohol is in your drink. In the [place] one drink equals one 12 oz bottle of beer (355 mL), one 5 oz glass of wine (148 mL), or one 1 oz glass of hard liquor (44 mL). Do not use any products that contain nicotine or tobacco. These products include cigarettes, chewing tobacco, and vaping devices, such as e-cigarettes. If you need help quitting, ask your health care provider. Activity  Follow a regular exercise program to stay fit. This will help you maintain your balance. Ask your health care  provider what types of exercise are appropriate for you. If you need a cane or walker, use it as recommended by your health care provider. Wear supportive shoes that have nonskid soles. Safety  Remove any tripping hazards, such as rugs, cords, and clutter. Install safety equipment such as grab bars in bathrooms and safety rails on stairs. Keep rooms and walkways well-lit. General instructions Talk with your health  care provider about your risks for falling. Tell your health care provider if: You fall. Be sure to tell your health care provider about all falls, even ones that seem minor. You feel dizzy, tiredness (fatigue), or off-balance. Take over-the-counter and prescription medicines only as told by your health care provider. These include supplements. Eat a healthy diet and maintain a healthy weight. A healthy diet includes low-fat dairy products, low-fat (lean) meats, and fiber from whole grains, beans, and lots of fruits and vegetables. Stay current with your vaccines. Schedule regular health, dental, and eye exams. Summary Having a healthy lifestyle and getting preventive care can help to protect your health and wellness after age 18. Screening and testing are the best way to find a health problem early and help you avoid having a fall. Early diagnosis and treatment give you the best chance for managing medical conditions that are more common for people who are older than age 44. Falls are a major cause of broken bones and head injuries in people who are older than age 23. Take precautions to prevent a fall at home. Work with your health care provider to learn what changes you can make to improve your health and wellness and to prevent falls. This information is not intended to replace advice given to you by your health care provider. Make sure you discuss any questions you have with your health care provider. Document Revised: 01/06/2021 Document Reviewed: 01/06/2021 Elsevier Patient Education  [place].

## 2022-07-21 ENCOUNTER — Encounter: Payer: Self-pay | Admitting: Pulmonary Disease

## 2022-07-21 LAB — COMPREHENSIVE METABOLIC PANEL
ALT: 15 U/L (ref 0–53)
AST: 16 U/L (ref 0–37)
Albumin: 3.9 g/dL (ref 3.5–5.2)
Alkaline Phosphatase: 59 U/L (ref 39–117)
BUN: 24 mg/dL — ABNORMAL HIGH (ref 6–23)
CO2: 29 mEq/L (ref 19–32)
Calcium: 9.6 mg/dL (ref 8.4–10.5)
Chloride: 103 mEq/L (ref 96–112)
Creatinine, Ser: 1.57 mg/dL — ABNORMAL HIGH (ref 0.40–1.50)
GFR: 43.07 mL/min — ABNORMAL LOW (ref >60.00)
Glucose, Bld: 117 mg/dL — ABNORMAL HIGH (ref 70–99)
Potassium: 3.7 mEq/L (ref 3.5–5.1)
Sodium: 141 mEq/L (ref 135–145)
Total Bilirubin: 0.3 mg/dL (ref 0.2–1.2)
Total Protein: 5.9 g/dL — ABNORMAL LOW (ref 6.0–8.3)

## 2022-07-21 LAB — VITAMIN D 25 HYDROXY (VIT D DEFICIENCY, FRACTURES): VITD: 47.68 ng/mL (ref 30.00–100.00)

## 2022-07-21 LAB — LIPID PANEL
Cholesterol: 114 mg/dL (ref 0–200)
HDL: 29.3 mg/dL — ABNORMAL LOW (ref >39.00)
NonHDL: 85.13
Total CHOL/HDL Ratio: 4
Triglycerides: 236 mg/dL — ABNORMAL HIGH (ref 0.0–149.0)
VLDL: 47.2 mg/dL — ABNORMAL HIGH (ref 0.0–40.0)

## 2022-07-21 LAB — MICROALBUMIN / CREATININE URINE RATIO
Creatinine,U: 200 mg/dL
Microalb Creat Ratio: 136.4 mg/g — ABNORMAL HIGH (ref 0.0–30.0)
Microalb, Ur: 272.8 mg/dL — ABNORMAL HIGH (ref 0.0–1.9)

## 2022-07-21 LAB — LDL CHOLESTEROL, DIRECT: Direct LDL: 52 mg/dL

## 2022-07-21 LAB — PSA: PSA: 0.96 ng/mL (ref 0.10–4.00)

## 2022-07-21 LAB — HEMOGLOBIN A1C: Hgb A1c MFr Bld: 7.1 % — ABNORMAL HIGH (ref 4.6–6.5)

## 2022-07-21 NOTE — Assessment & Plan Note (Signed)
H/o diarrhea to gabapentin and severe diarrhea to Multaq - however stable period on low dose metformin IR.  If increased dose needed, he would be interested in trying XR formulation.

## 2022-07-21 NOTE — Telephone Encounter (Signed)
Patient called to set up an appointment. Per Rexene Edison, NP, If necessary Judson Roch can call him to answer any questions he may have as she has seen him in the past. Please advise on CT scan.

## 2022-07-21 NOTE — Assessment & Plan Note (Signed)
[place]

## 2022-07-21 NOTE — Assessment & Plan Note (Signed)
Remains abstinent 

## 2022-07-21 NOTE — Assessment & Plan Note (Signed)
Update FLP on crestor '20mg'$  daily.  The 10-year ASCVD risk score (Arnett DK, et al., 2019) is: 51.1%   Values used to calculate the score:     Age: 76 years     Sex: Male     Is Non-Hispanic African American: No     Diabetic: Yes     Tobacco smoker: No     Systolic Blood Pressure: 073 mmHg     Is BP treated: Yes     HDL Cholesterol: 33 mg/dL     Total Cholesterol: 138 mg/dL

## 2022-07-21 NOTE — Assessment & Plan Note (Signed)
Largely asymptomatic from respiratory standpoint

## 2022-07-21 NOTE — Assessment & Plan Note (Addendum)
Reviewed new diagnosis from 05/2022 with patient. He is now on daily eliquis in addition to aspirin '81mg'$  daily. Also continues labetalol '200mg'$  daily for rate control through cardiology. He declined cardioversion. Reviewed importance of continued AC.

## 2022-07-21 NOTE — Assessment & Plan Note (Signed)
On vit D 50000 IU weekly.

## 2022-07-21 NOTE — Assessment & Plan Note (Signed)
He has been on CPAP.

## 2022-07-21 NOTE — Assessment & Plan Note (Signed)
Encouraged healthy diet and lifestyle choices to affect sustainable weight loss.  ?

## 2022-07-21 NOTE — Assessment & Plan Note (Signed)
Chronic, adequate on current regimen.

## 2022-07-21 NOTE — Assessment & Plan Note (Signed)
Followed by eye doctor at the [facility]

## 2022-07-21 NOTE — Assessment & Plan Note (Signed)
Advanced directive discussion - would want daughter [name] to be HCPOA - also wife. Packet previously provided - encouraged he work on this.

## 2022-07-21 NOTE — Assessment & Plan Note (Signed)
Followed by podiatrist at the [facility].

## 2022-07-22 ENCOUNTER — Other Ambulatory Visit: Payer: Medicare Other

## 2022-07-22 ENCOUNTER — Telehealth: Payer: Self-pay

## 2022-07-22 NOTE — Telephone Encounter (Signed)
Per Dr. Valeta Harms:  "it depends on how agreessive they want to be   if they want to talk about bx I am open to that.... but its small and 6 month ct follow up isa fine option.   BI otherwise.... he needs and appt with me or Eric Form and we can talk about the pros and cons or procedure or waiting"    Contacted patient and wife over phone and advised an office visit with Dr Valeta Harms is recommended. Offered first available, 08/10/2022 but patient/wife declined as they were out of town.  Offered other dates that were available.  Mr Novick and his wife were unavailable until 09/23/2022.  Explained to patient Dr. Fabio Bering schedule is not open at this date and advised patient to call back closer to the end of December to get an appointment time that is convenient for the patient.  Patient verbalized understanding and confirmed he will call back in December to schedule an in office visit with Dr. Valeta Harms.  Patient had no further questions at this time.

## 2022-07-22 NOTE — Progress Notes (Signed)
Dr. Valeta Harms responded to patient comment.  Called patient and gave information.  See telephone call encounter for 07/22/22 in detail.  Patient had no further questions and will contact the office to schedule an in person OV for January 2024.

## 2022-07-22 NOTE — Progress Notes (Signed)
Called patient with results regarding the 07/21/22 CT chest.  I spoke with both the patient and his wife during the call.  Patient verbalized understanding of Dr. Valeta Harms comments.  Patient would like to know if Dr. Valeta Harms thinks a biopsy is needed at this point.  Also, patient read results of CT scan on MyChart and is concerned about the impression of more than one nodule noted.  I did not make a return appointment or schedule the 6 month repeat CT super D as patient wants to get Dr. Fabio Bering recommendation first.  Dr. Valeta Harms,  Please advise.

## 2022-07-22 NOTE — Telephone Encounter (Signed)
Called patient with results regarding the 07/21/22 CT chest.  I spoke with both the patient and his wife during the call.  Patient verbalized understanding of Dr. Valeta Harms comments.  Patient would like to know if Dr. Valeta Harms thinks a biopsy is needed at this point.  Also, patient read results of CT scan on MyChart and is concerned about the impression of more than one nodule noted.  I did not make a return appointment or schedule the 6 month repeat CT super D as patient wants to get Dr. Fabio Bering recommendation first.   This message was routed to Dr. Valeta Harms today.

## 2022-07-22 NOTE — Progress Notes (Signed)
Please let patient know I have reviewed his CT chest. His nodule is stable. We need to keep a close watch on it. Please repeat the ct chest wo contrast super d ct in 6 months and have follow up appt with me or SG, NP scheduled after the ct is complete.   Thanks,  BLI  Garner Nash, DO [facility] 07/22/2022 1:57 PM

## 2022-07-24 ENCOUNTER — Other Ambulatory Visit: Payer: Self-pay

## 2022-07-24 DIAGNOSIS — R911 Solitary pulmonary nodule: Secondary | ICD-10-CM

## 2022-07-27 ENCOUNTER — Other Ambulatory Visit: Payer: Self-pay | Admitting: Family Medicine

## 2022-07-30 ENCOUNTER — Encounter: Payer: Self-pay | Admitting: Family Medicine

## 2022-07-30 DIAGNOSIS — N289 Disorder of kidney and ureter, unspecified: Secondary | ICD-10-CM

## 2022-08-10 DIAGNOSIS — N183 Chronic kidney disease, stage 3 unspecified: Secondary | ICD-10-CM | POA: Insufficient documentation

## 2022-08-10 DIAGNOSIS — N289 Disorder of kidney and ureter, unspecified: Secondary | ICD-10-CM | POA: Insufficient documentation

## 2022-08-10 NOTE — Telephone Encounter (Signed)
Replied via other mychart message.

## 2022-08-26 ENCOUNTER — Telehealth: Payer: Self-pay | Admitting: Family Medicine

## 2022-08-26 NOTE — Telephone Encounter (Addendum)
I discussed this with William Hebert and Lisa at 11am.  Given above symptoms, I recommend in person evaluation[place]

## 2022-08-26 NOTE — Telephone Encounter (Signed)
Patient wife called in and stated that William Hebert has a horrible cough. He stated that its so bad he doesn't want to move because when he moves he cough. She stated that he is just sitting up in his recliner because when he lays down it gets worse. They have been using the cough pill from OTC but it isn't helping. She stated he doesn't want to go to urgent care or the hospital. She is concerned he is heading towards pneumonia. She stated that he coughed all night.  Please advise. Thank you!

## 2022-08-27 NOTE — Telephone Encounter (Signed)
Left message on vm per dpr relaying Dr. G's message.  

## 2022-08-28 ENCOUNTER — Encounter: Payer: Self-pay | Admitting: Family Medicine

## 2022-08-28 NOTE — Telephone Encounter (Signed)
Left message on vm per dpr relaying Dr. G's message.  

## 2022-09-02 NOTE — Telephone Encounter (Signed)
Message has been addressed in My Chart message dated 08/28/22.

## 2022-09-14 LAB — BASIC METABOLIC PANEL
BUN: 18 (ref 4–21)
Creatinine: 1.7 — AB (ref 0.6–1.3)
Glucose: 167
Potassium: 4.1 mEq/L (ref 3.5–5.1)
Sodium: 142 (ref 137–147)

## 2022-09-14 LAB — HEPATIC FUNCTION PANEL
ALT: 23 U/L (ref 10–40)
AST: 23 (ref 14–40)
Alkaline Phosphatase: 70 (ref 25–125)
Bilirubin, Total: 0.4

## 2022-09-14 LAB — CBC AND DIFFERENTIAL
Hemoglobin: 13.5 (ref 13.5–17.5)
Platelets: 155 10*3/uL (ref 150–400)
WBC: 7.5

## 2022-09-14 LAB — COMPREHENSIVE METABOLIC PANEL
Albumin: 3.6 (ref 3.5–5.0)
Calcium: 8.8 (ref 8.7–10.7)

## 2022-09-15 ENCOUNTER — Telehealth: Payer: Self-pay

## 2022-09-15 NOTE — Telephone Encounter (Signed)
Transition Care Management Unsuccessful Follow-up Telephone Call  Date of discharge and from where:  09/14/2022 [facility]  Attempts:  1st Attempt  Reason for unsuccessful TCM follow-up call:  Left voice message

## 2022-09-21 ENCOUNTER — Other Ambulatory Visit: Payer: Self-pay

## 2022-09-21 ENCOUNTER — Encounter: Payer: Self-pay | Admitting: Family Medicine

## 2022-09-21 DIAGNOSIS — E118 Type 2 diabetes mellitus with unspecified complications: Secondary | ICD-10-CM

## 2022-09-21 MED ORDER — METFORMIN HCL 500 MG PO TABS: 500.0000 mg | ORAL_TABLET | Freq: Every day | ORAL | 3 refills | Status: DC

## 2022-09-21 NOTE — Telephone Encounter (Signed)
E-scribed refill 

## 2022-11-01 ENCOUNTER — Other Ambulatory Visit: Payer: Self-pay | Admitting: Family Medicine

## 2022-11-02 ENCOUNTER — Other Ambulatory Visit: Payer: Self-pay | Admitting: Family Medicine

## 2022-11-02 DIAGNOSIS — E559 Vitamin D deficiency, unspecified: Secondary | ICD-10-CM

## 2022-12-03 LAB — HM DIABETES EYE EXAM

## 2022-12-07 ENCOUNTER — Encounter: Payer: Self-pay | Admitting: Family Medicine

## 2022-12-14 NOTE — Telephone Encounter (Signed)
Please request latest diabetic eye exam as per above.

## 2022-12-14 NOTE — Telephone Encounter (Signed)
Faxed request to [facility] (Dr. Alfonso Patten) at [phone].

## 2023-01-06 ENCOUNTER — Other Ambulatory Visit: Payer: Self-pay | Admitting: Family Medicine

## 2023-01-06 DIAGNOSIS — I10 Essential (primary) hypertension: Secondary | ICD-10-CM

## 2023-01-08 ENCOUNTER — Other Ambulatory Visit: Payer: Self-pay | Admitting: Family Medicine

## 2023-01-08 DIAGNOSIS — K219 Gastro-esophageal reflux disease without esophagitis: Secondary | ICD-10-CM

## 2023-01-12 ENCOUNTER — Ambulatory Visit (HOSPITAL_BASED_OUTPATIENT_CLINIC_OR_DEPARTMENT_OTHER): Admission: RE | Admit: 2023-01-12 | Payer: Medicare Other | Source: Ambulatory Visit

## 2023-01-14 ENCOUNTER — Telehealth: Payer: Self-pay | Admitting: Pulmonary Disease

## 2023-01-14 NOTE — Telephone Encounter (Signed)
Spoke to the patient wife they live down at [place] and needs the ct done at [facility] hosp I have printed and faxed the order

## 2023-01-14 NOTE — Telephone Encounter (Signed)
Needs a referral for CT Scan to be sent to [facility]. Fax: [phone]

## 2023-01-15 ENCOUNTER — Ambulatory Visit: Payer: Medicare Other | Admitting: Acute Care

## 2023-02-03 ENCOUNTER — Telehealth: Payer: Medicare Other | Admitting: Pulmonary Disease

## 2023-03-02 ENCOUNTER — Telehealth: Payer: Self-pay | Admitting: Pulmonary Disease

## 2023-03-02 NOTE — Telephone Encounter (Signed)
Spoke with pt's wife. She states the pt had his CT chest on 02/25/2023 for his upcoming appt with Dr. Tonia Brooms on 7/16. CT was completed at [facility] and pt's wife states the images are in [place]. Will speak with Ezra Sites, LPN, when she returns to office on 7/8.

## 2023-03-02 NOTE — Telephone Encounter (Signed)
William Hebert states CT scan be seen by [facility]. Our office needs to request the CT scan report. Phone number is [phone]. William Hebert phone number is [phone].

## 2023-03-08 NOTE — Telephone Encounter (Signed)
Spoke with Misty Stanley. We do not have the images in [employer]. Called IT with [employer] [phone]) and spoke with a rep that advised that [facility] would need to specifically need to share the images to [facility]. If that location is not selected then we would not be able to view the images in [facility]. I called and left a detailed VM for the pt to call [employer] to sign the release form to share the images in [facility] through [facility] or to obtain a disc to bring to his appt, whichever is easiest for them. Will await call back.

## 2023-03-09 ENCOUNTER — Telehealth: Payer: Self-pay | Admitting: Acute Care

## 2023-03-09 NOTE — Telephone Encounter (Signed)
Pt wife has overnight the disc of Pt test. And is guaranteed to be to [place] by 6am

## 2023-03-11 NOTE — Telephone Encounter (Signed)
See other phone note from 03/02/2023.

## 2023-03-11 NOTE — Telephone Encounter (Signed)
Called and spoke to patient's wife, William Hebert. She states she overnighted the CT chest and we should receive it today. William Hebert is requesting a call back if we have not received the disc.

## 2023-03-15 NOTE — Telephone Encounter (Signed)
Spoke with Hedda Slade, CMA. She has confirmed she has the disc and will keep the disc until the patient's appt with Dr. Tonia Brooms on 7/16. Nothing further needed at this time.

## 2023-03-16 ENCOUNTER — Telehealth (INDEPENDENT_AMBULATORY_CARE_PROVIDER_SITE_OTHER): Payer: Medicare Other | Admitting: Pulmonary Disease

## 2023-03-16 DIAGNOSIS — R918 Other nonspecific abnormal finding of lung field: Secondary | ICD-10-CM | POA: Diagnosis not present

## 2023-03-16 MED ORDER — ANORO ELLIPTA 62.5-25 MCG/ACT IN AEPB: 1.0000 | INHALATION_SPRAY | Freq: Every day | RESPIRATORY_TRACT | 5 refills | Status: DC

## 2023-03-16 NOTE — Progress Notes (Signed)
Virtual Visit via Video Note  I connected with William Hebert on 03/16/23 at  1:00 PM EDT by a video enabled telemedicine application and verified that I am speaking with the correct person using two identifiers.  Location: Patient: Home  Provider: Office    I discussed the limitations of evaluation and management by telemedicine and the availability of in person appointments. The patient expressed understanding and agreed to proceed.  History of Present Illness:  77 yo M, PMH seen in consult via video visit. PMH listed below. He was enrolled in our LDCT program and was found to have a RUL nodule. Subsolid lesion. Slowly changing over time. Last seen on ct in 2021. This CT shows stable image but it does appear more solid in nature in comparison. He was sent for PET scan that showed no significant uptake. But this likely due to fact the lesion is so small.   OV 03/16/2023: Here today for follow-up after lung cancer screening CT follow-up.  Had a noncontrasted CT chest in June 2024.  Patient lives in [place].  Met today via video visit to review CT imaging.  It shows bilateral upper lobe nonsolid nodules and some areas of lower lobe bronchiectasis which are stable compared to his previous images.  He has ongoing respiratory complaints including cough and sputum production he had COVID in January 2024.  Still not really sure that he is recovered from this.  States he still feels tired.  He is also been managed from cardiology standpoint with diuretics because he is up 10 pounds in water weight.  Past Medical History:  Diagnosis Date   AAA (abdominal aortic aneurysm) without rupture (HCC) 07/10/2014   S/p endovascular repair 2012 with stent [facility]), yearly f/u   Arthritis    Cancer [facility])    skin cancer non melanioma   Chronic bronchitis (HCC) 11/2016 AND 10-2016   COPD (chronic obstructive pulmonary disease) (HCC)    chronic bronchitis   GERD (gastroesophageal reflux disease)     Headache(784.0)    in 20's   Hepatitis    Carrier cant give blood gives a false positive   Hernia of abdominal wall    LARGE FROM BREAST BONE AREA TO BELLY BUTTON   History of irregular heartbeat    History of skin cancer 2014   HTN (hypertension)    Hyperlipidemia    Hypogonadism in male    prior on testosterone cream   Knee pain    Left   Morbidly obese (HCC)    Peripheral edema    feet   Pneumonia    PONV (postoperative nausea and vomiting)    PONV AFTER AAA REPAIR, ALL OTHER SURGERIES WENT OK   Posterior vitreous detachment 02/2018   Shortness of breath    WITH EXERTION   Sleep apnea    CPAP       Observations/Objective:  Patient seen on video. No acute distress, wife sitting with him.  Assessment and Plan:  Upper lobe bilateral subsolid nodules History of tobacco use, quit 10 years ago. Plan: Continue CT scan follow-up. Nodules appear stable. We talked about this today. We talked about various options but watchful waiting I think is the most appropriate in his setting. We talked about management of his potential underlying COPD. Has not been on any maintenance inhalers in the past. Sent a new prescription in for Anoro Ellipta. Encouraged him to use his albuterol as needed.  Return to clinic with a video visit to see myself  or SG, NP to discuss repeat CT imaging.  Follow Up Instructions:   I discussed the assessment and treatment plan with the patient. The patient was provided an opportunity to ask questions and all were answered. The patient agreed with the plan and demonstrated an understanding of the instructions.   The patient was advised to call back or seek an in-person evaluation if the symptoms worsen or if the condition fails to improve as anticipated.  I spent 22 minutes dedicated to the care of this patient on the date of this encounter to include pre-visit review of records, non-face-to-face time with the patient discussing conditions above, post  visit ordering of testing, clinical documentation with the electronic health record, making appropriate referrals as documented, and communicating necessary findings to members of the patients care team.    William Igo, DO [facility] 03/16/2023 12:52 PM

## 2023-03-17 ENCOUNTER — Other Ambulatory Visit: Payer: Self-pay | Admitting: Family Medicine

## 2023-03-17 DIAGNOSIS — N289 Disorder of kidney and ureter, unspecified: Secondary | ICD-10-CM

## 2023-03-17 DIAGNOSIS — E1169 Type 2 diabetes mellitus with other specified complication: Secondary | ICD-10-CM

## 2023-03-17 NOTE — Progress Notes (Signed)
Ordering future labs for upcoming f/u appt.

## 2023-03-18 LAB — CBC WITH DIFFERENTIAL/PLATELET
Basophils Absolute: 0.1 10*3/uL (ref 0.0–0.2)
Basos: 1 %
EOS (ABSOLUTE): 0.2 10*3/uL (ref 0.0–0.4)
Eos: 2 %
Hematocrit: 40.6 % (ref 37.5–51.0)
Hemoglobin: 13.4 g/dL (ref 13.0–17.7)
Immature Grans (Abs): 0.1 10*3/uL (ref 0.0–0.1)
Immature Granulocytes: 1 %
Lymphocytes Absolute: 0.9 10*3/uL (ref 0.7–3.1)
Lymphs: 12 %
MCH: 29.8 pg (ref 26.6–33.0)
MCHC: 33 g/dL (ref 31.5–35.7)
MCV: 90 fL (ref 79–97)
Monocytes Absolute: 0.5 10*3/uL (ref 0.1–0.9)
Monocytes: 6 %
Neutrophils Absolute: 5.9 10*3/uL (ref 1.4–7.0)
Neutrophils: 78 %
Platelets: 169 10*3/uL (ref 150–450)
RBC: 4.5 x10E6/uL (ref 4.14–5.80)
RDW: 13.2 % (ref 11.6–15.4)
WBC: 7.5 10*3/uL (ref 3.4–10.8)

## 2023-03-18 LAB — LDL CHOLESTEROL, DIRECT: LDL Direct: 52 mg/dL (ref 0–99)

## 2023-03-18 LAB — COMPREHENSIVE METABOLIC PANEL
ALT: 17 IU/L (ref 0–44)
AST: 19 IU/L (ref 0–40)
Albumin: 3.8 g/dL (ref 3.8–4.8)
Alkaline Phosphatase: 80 IU/L (ref 44–121)
BUN/Creatinine Ratio: 12 (ref 10–24)
BUN: 17 mg/dL (ref 8–27)
Bilirubin Total: 0.4 mg/dL (ref 0.0–1.2)
CO2: 24 mmol/L (ref 20–29)
Calcium: 9.3 mg/dL (ref 8.6–10.2)
Chloride: 100 mmol/L (ref 96–106)
Creatinine, Ser: 1.39 mg/dL — ABNORMAL HIGH (ref 0.76–1.27)
Globulin, Total: 1.9 g/dL (ref 1.5–4.5)
Glucose: 228 mg/dL — ABNORMAL HIGH (ref 70–99)
Potassium: 3.6 mmol/L (ref 3.5–5.2)
Sodium: 140 mmol/L (ref 134–144)
Total Protein: 5.7 g/dL — ABNORMAL LOW (ref 6.0–8.5)
eGFR: 53 mL/min/{1.73_m2} — ABNORMAL LOW (ref >59)

## 2023-03-18 LAB — MICROALBUMIN / CREATININE URINE RATIO
Creatinine, Urine: 163.7 mg/dL
Microalb/Creat Ratio: 1519 mg/g creat — ABNORMAL HIGH (ref 0–29)
Microalbumin, Urine: 2486 ug/mL

## 2023-03-18 LAB — PARATHYROID HORMONE, INTACT (NO CA): PTH: 41 pg/mL (ref 15–65)

## 2023-03-18 LAB — VITAMIN D 25 HYDROXY (VIT D DEFICIENCY, FRACTURES): Vit D, 25-Hydroxy: 50 ng/mL (ref 30.0–100.0)

## 2023-03-18 LAB — HEMOGLOBIN A1C
Est. average glucose Bld gHb Est-mCnc: 163 mg/dL
Hgb A1c MFr Bld: 7.3 % — ABNORMAL HIGH (ref 4.8–5.6)

## 2023-03-23 ENCOUNTER — Ambulatory Visit: Payer: Medicare Other | Admitting: Family Medicine

## 2023-03-31 ENCOUNTER — Telehealth (INDEPENDENT_AMBULATORY_CARE_PROVIDER_SITE_OTHER): Payer: Medicare Other | Admitting: Family Medicine

## 2023-03-31 ENCOUNTER — Encounter: Payer: Self-pay | Admitting: Family Medicine

## 2023-03-31 VITALS — BP 125/67 | HR 67 | Temp 97.3°F | Ht 71.0 in | Wt 330.3 lb

## 2023-03-31 DIAGNOSIS — E559 Vitamin D deficiency, unspecified: Secondary | ICD-10-CM

## 2023-03-31 DIAGNOSIS — E1169 Type 2 diabetes mellitus with other specified complication: Secondary | ICD-10-CM

## 2023-03-31 DIAGNOSIS — E113293 Type 2 diabetes mellitus with mild nonproliferative diabetic retinopathy without macular edema, bilateral: Secondary | ICD-10-CM

## 2023-03-31 DIAGNOSIS — I10 Essential (primary) hypertension: Secondary | ICD-10-CM | POA: Diagnosis not present

## 2023-03-31 DIAGNOSIS — E1122 Type 2 diabetes mellitus with diabetic chronic kidney disease: Secondary | ICD-10-CM

## 2023-03-31 DIAGNOSIS — I5032 Chronic diastolic (congestive) heart failure: Secondary | ICD-10-CM

## 2023-03-31 DIAGNOSIS — E785 Hyperlipidemia, unspecified: Secondary | ICD-10-CM

## 2023-03-31 DIAGNOSIS — J449 Chronic obstructive pulmonary disease, unspecified: Secondary | ICD-10-CM

## 2023-03-31 DIAGNOSIS — E1121 Type 2 diabetes mellitus with diabetic nephropathy: Secondary | ICD-10-CM

## 2023-03-31 DIAGNOSIS — Z8679 Personal history of other diseases of the circulatory system: Secondary | ICD-10-CM

## 2023-03-31 DIAGNOSIS — Z9889 Other specified postprocedural states: Secondary | ICD-10-CM | POA: Diagnosis not present

## 2023-03-31 DIAGNOSIS — N183 Chronic kidney disease, stage 3 unspecified: Secondary | ICD-10-CM

## 2023-03-31 DIAGNOSIS — I480 Paroxysmal atrial fibrillation: Secondary | ICD-10-CM

## 2023-03-31 DIAGNOSIS — G629 Polyneuropathy, unspecified: Secondary | ICD-10-CM

## 2023-03-31 DIAGNOSIS — R0981 Nasal congestion: Secondary | ICD-10-CM

## 2023-03-31 MED ORDER — EMPAGLIFLOZIN 10 MG PO TABS: 10.0000 mg | ORAL_TABLET | Freq: Every day | ORAL | 1 refills | Status: DC

## 2023-03-31 NOTE — Assessment & Plan Note (Signed)
Recent urine microalbumin with marked deterioration in urinary protein loss - this is despite valsartan.  Reviewed benefits of SGLT2i such as jardiance or farxiga, as well as kidney protective effect and cardiovascular benefits.  Reviewed mechanism of action as well as side effects to watch for including but not limited to UTI, yeast infection, groin cellulitis skin infection.  Will start jardiance 10mg  daily - they will price this out.

## 2023-03-31 NOTE — Assessment & Plan Note (Signed)
Saw pulm (Icard), started on anoro ellipta. Reviewed controller inhaler use, encouraged try at least for several weeks to see effect on breathing.

## 2023-03-31 NOTE — Assessment & Plan Note (Addendum)
Ongoing, predominantly when he wakes up in the morning. Recommend nasal saline irrigation throughout the day, suggested nasal steroid such as Flonase OTC at night.  Reviewed mechanism of action of medicine and need to watch for nosebleeds.

## 2023-03-31 NOTE — Assessment & Plan Note (Addendum)
Chronic, stable on current regimen. Med list reviewed, updated.  He's now on lasix 80mg  daily and valsartan 80mg  once daily.

## 2023-03-31 NOTE — Assessment & Plan Note (Signed)
Chronic, A1c above goal but overall stable.  See below re: worsening proteinuria.

## 2023-03-31 NOTE — Assessment & Plan Note (Signed)
Continues vitamin D 50,000IU weekly.

## 2023-03-31 NOTE — Assessment & Plan Note (Signed)
Reviewed latest eye exam in chart 12/14/2022.

## 2023-03-31 NOTE — Assessment & Plan Note (Signed)
Chronic, permanent, continues eliquis labetalol and nifedipine.

## 2023-03-31 NOTE — Progress Notes (Addendum)
Ph: [phone] Fax: [phone]   Patient ID: William Hebert, male    DOB: [birth year], 77 y.o.   MRN: [id number]  Virtual visit completed through MyChart, a video enabled telemedicine application. Due to national recommendations of social distancing due to COVID-19, a virtual visit is felt to be most appropriate for this patient at this time. Reviewed limitations, risks, security and privacy concerns of performing a virtual visit and the availability of in person appointments. I also reviewed that there may be a patient responsible charge related to this service. The patient agreed to proceed.   Patient location: home, [place] Provider location: Adult nurse at [facility], office Persons participating in this virtual visit: patient, provider   If any vitals were documented, they were collected by patient at home unless specified below.    BP 125/67   Pulse 67   Temp (!) 97.3 F (36.3 C)   Ht 5\' 11"  (1.803 m)   Wt (!) 330 lb 5 oz (149.8 kg)   BMI 46.07 kg/m   BP Readings from Last 3 Encounters:  03/31/23 125/67  07/20/22 (!) 140/80  07/18/21 140/80    CC: 6 mo DM f/u visit  Subjective:   HPI: William Hebert is a 77 y.o. male presenting on 03/31/2023 for Medical Management of Chronic Issues (DM f/u.)   Had car trouble so unable to come to last week's appt.   Episode last week of nausea/vomiting, that has now resolved.  Notes am sinus congestion - rec flonase nightly for a few weeks and note effect  Saw Dr Tonia Brooms 2 weeks ago virtually for monitoring pulmonary nodules noted on lung cancer screening. Rec continue CT scan follow up for stable appearing nodules. He was started on Anoro Ellipta for underlying COPD.   Seeing [facility] cardiologist Dr Irving Copas for diastolic CHF, asymptomatic afib, chronic lymphedema and lower extremity edema, last seen 01/2023 - increased lasix to 80mg  daily x 3 days. Contrasted CT abd ordered for monitoring of AAA -  done yesterday. Continues eliquis  5mg  bid. Upcoming appt 04/08/2023.   DM - does not regularly check sugars, last checked 2 wks ago 122. Compliant with antihyperglycemic regimen which includes: metformin 500mg  daily. Denies low sugars or hypoglycemic symptoms. Denies paresthesias, blurry vision. Last diabetic eye exam 12/14/2022 at [facility] in [place] - mld non-proliferative DR OU, as well as PVD. Glucometer brand: accuchek. Last foot exam: DUE - not completed due to virtual visit. DSME: has not completed. Acute worsening of urine protein levels despite daily valsartan 80mg  - recommend starting SGLT2i.  Lab Results  Component Value Date   HGBA1C 7.3 (H) 03/17/2023   Diabetic Foot Exam - Simple   No data filed    Lab Results  Component Value Date   MICROALBUR 272.8 (H) 07/20/2022   Started on Qutenza (capsaicin) wraps - done Q70mo at podiatry office.      Relevant past medical, surgical, family and social history reviewed and updated as indicated. Interim medical history since our last visit reviewed. Allergies and medications reviewed and updated. Outpatient Medications Prior to Visit  Medication Sig Dispense Refill   Accu-Chek Softclix Lancets lancets Check blood sugar once daily 100 each 1   acetaminophen (TYLENOL) 500 MG tablet Take 1,000 mg by mouth every 6 (six) hours as needed for moderate pain or mild pain.     apixaban (ELIQUIS) 5 MG TABS tablet Take 5 mg by mouth 2 (two) times daily.     Ascorbic Acid (VITAMIN  C PO) Take 1,000 mg by mouth daily. Super C     aspirin EC 81 MG tablet Take 1 tablet (81 mg total) by mouth daily. Day after surgery 30 tablet 1   B Complex-C (SUPER B COMPLEX PO) Take 1 tablet by mouth daily.     Blood Glucose Monitoring Suppl (ACCU-CHEK GUIDE) w/Device KIT Use as instructed to check blood sugar once daily. 1 kit 0   Cholecalciferol (VITAMIN D3) 1.25 MG (50000 UT) capsule TAKE 1 CAPSULE BY MOUTH EVERY FRIDAY IN THE MORNING 12 capsule 3   glucose blood (ACCU-CHEK AVIVA PLUS) test  strip Use to check blood sugar once daily 100 each 1   labetalol (NORMODYNE) 200 MG tablet TAKE 1 TABLET BY MOUTH DAILY 90 tablet 3   metFORMIN (GLUCOPHAGE) 500 MG tablet Take 1 tablet (500 mg total) by mouth daily with breakfast. 90 tablet 3   Multiple Vitamins-Minerals (MULTIVITAMIN ADULT PO) Take 1 tablet by mouth daily. MEN'S 50+ MULTIVITAMIN.     NIFEdipine (PROCARDIA XL/NIFEDICAL XL) 60 MG 24 hr tablet TAKE 1 TABLET BY MOUTH DAILY 90 tablet 3   omeprazole (PRILOSEC) 40 MG capsule TAKE 1 CAPSULE BY MOUTH DAILY 90 capsule 1   potassium chloride SA (KLOR-CON M) 20 MEQ tablet TAKE 1 TABLET BY MOUTH DAILY 90 tablet 3   rosuvastatin (CRESTOR) 20 MG tablet TAKE 1 TABLET BY MOUTH  DAILY 90 tablet 3   umeclidinium-vilanterol (ANORO ELLIPTA) 62.5-25 MCG/ACT AEPB Inhale 1 puff into the lungs daily. 60 each 5   Zinc 100 MG TABS Take 1 tablet (100 mg total) by mouth daily. 90 tablet 3   furosemide (LASIX) 40 MG tablet TAKE 1 TABLET BY MOUTH DAILY 90 tablet 1   valsartan (DIOVAN) 80 MG tablet Take 80 mg by mouth in the morning and at bedtime.     furosemide (LASIX) 40 MG tablet Take 2 tablets (80 mg total) by mouth daily.     valsartan (DIOVAN) 80 MG tablet Take 1 tablet (80 mg total) by mouth daily.     No facility-administered medications prior to visit.     Per HPI unless specifically indicated in ROS section below Review of Systems Objective:  BP 125/67   Pulse 67   Temp (!) 97.3 F (36.3 C)   Ht 5\' 11"  (1.803 m)   Wt (!) 330 lb 5 oz (149.8 kg)   BMI 46.07 kg/m   Wt Readings from Last 3 Encounters:  03/31/23 (!) 330 lb 5 oz (149.8 kg)  07/20/22 (!) 340 lb (154.2 kg)  04/02/22 (!) 339 lb (153.8 kg)       Physical exam: Gen: alert, NAD, not ill appearing Pulm: speaks in complete sentences without increased work of breathing Psych: normal mood, normal thought content      Results for orders placed or performed in visit on 03/17/23  Parathyroid hormone, intact (no Ca)  Result  Value Ref Range   PTH 41 15 - 65 pg/mL  Microalbumin / creatinine urine ratio  Result Value Ref Range   Creatinine, Urine 163.7 Not Estab. mg/dL   Microalbumin, Urine 4,098.1 Not Estab. ug/mL   Microalb/Creat Ratio 1,519 (H) 0 - 29 mg/g creat  VITAMIN D 25 Hydroxy (Vit-D Deficiency, Fractures)  Result Value Ref Range   Vit D, 25-Hydroxy 50.0 30.0 - 100.0 ng/mL  CBC with Differential/Platelet  Result Value Ref Range   WBC 7.5 3.4 - 10.8 x10E3/uL   RBC 4.50 4.14 - 5.80 x10E6/uL   Hemoglobin 13.4 13.0 -  17.7 g/dL   Hematocrit 96.0 45.4 - 51.0 %   MCV 90 79 - 97 fL   MCH 29.8 26.6 - 33.0 pg   MCHC 33.0 31.5 - 35.7 g/dL   RDW 09.8 11.9 - 14.7 %   Platelets 169 150 - 450 x10E3/uL   Neutrophils 78 Not Estab. %   Lymphs 12 Not Estab. %   Monocytes 6 Not Estab. %   Eos 2 Not Estab. %   Basos 1 Not Estab. %   Neutrophils Absolute 5.9 1.4 - 7.0 x10E3/uL   Lymphocytes Absolute 0.9 0.7 - 3.1 x10E3/uL   Monocytes Absolute 0.5 0.1 - 0.9 x10E3/uL   EOS (ABSOLUTE) 0.2 0.0 - 0.4 x10E3/uL   Basophils Absolute 0.1 0.0 - 0.2 x10E3/uL   Immature Granulocytes 1 Not Estab. %   Immature Grans (Abs) 0.1 0.0 - 0.1 x10E3/uL  LDL Cholesterol, Direct  Result Value Ref Range   LDL Direct 52 0 - 99 mg/dL  Hemoglobin W2N  Result Value Ref Range   Hgb A1c MFr Bld 7.3 (H) 4.8 - 5.6 %   Est. average glucose Bld gHb Est-mCnc 163 mg/dL  Comprehensive metabolic panel  Result Value Ref Range   Glucose 228 (H) 70 - 99 mg/dL   BUN 17 8 - 27 mg/dL   Creatinine, Ser 5.62 (H) 0.76 - 1.27 mg/dL   eGFR 53 (L) >13 YQ/MVH/8.46   BUN/Creatinine Ratio 12 10 - 24   Sodium 140 134 - 144 mmol/L   Potassium 3.6 3.5 - 5.2 mmol/L   Chloride 100 96 - 106 mmol/L   CO2 24 20 - 29 mmol/L   Calcium 9.3 8.6 - 10.2 mg/dL   Total Protein 5.7 (L) 6.0 - 8.5 g/dL   Albumin 3.8 3.8 - 4.8 g/dL   Globulin, Total 1.9 1.5 - 4.5 g/dL   Bilirubin Total 0.4 0.0 - 1.2 mg/dL   Alkaline Phosphatase 80 44 - 121 IU/L   AST 19 0 - 40 IU/L    ALT 17 0 - 44 IU/L   Assessment & Plan:   Type 2 diabetes mellitus with other specified complication, without long-term current use of insulin (HCC) Assessment & Plan: Chronic, A1c above goal but overall stable.  See below re: worsening proteinuria.    Mild nonproliferative diabetic retinopathy of both eyes without macular edema associated with type 2 diabetes mellitus [facility]) Assessment & Plan: Reviewed latest eye exam in chart 12/14/2022.    Primary hypertension Assessment & Plan: Chronic, stable on current regimen. Med list reviewed, updated.  He's now on lasix 80mg  daily and valsartan 80mg  once daily.    Dyslipidemia associated with type 2 diabetes mellitus (HCC) Assessment & Plan: Recent FLP stable on rosuvastatin 20mg  - continue.  The ASCVD Risk score (Arnett DK, et al., 2019) failed to calculate for the following reasons:   The valid total cholesterol range is 130 to 320 mg/dL    S/P AAA (abdominal aortic aneurysm) repair Assessment & Plan: Just had contrasted abd CT by cardiology to monitor known AAA s/p endovascular repair    Obesity, morbid, BMI 40.0-49.9 (HCC)  Vitamin D deficiency Assessment & Plan: Continues vitamin D 50,000IU weekly.    Diastolic dysfunction with chronic heart failure [facility]) Assessment & Plan: Stable on latest echo, followed by cardiology at the [facility] Dr Irving Copas.  Recent lasix dose increased to 80mg  daily.    Chronic obstructive pulmonary disease, unspecified COPD type (HCC) Assessment & Plan: Saw pulm (Icard), started on anoro ellipta. Reviewed controller inhaler  use, encouraged try at least for several weeks to see effect on breathing.    Small fiber neuropathy Assessment & Plan: Followed by podiatry Dr Carmie End underging Qutenza capsaicin Q54mo waps with benefit.    Paroxysmal atrial fibrillation (HCC) Assessment & Plan: Chronic, permanent, continues eliquis labetalol and nifedipine.    CKD stage 3 due to type 2 diabetes  mellitus (HCC) Assessment & Plan: GFR chronically 50s over last several years with some fluctuation. See below. Continue tight BP and sugar control. Start SGLT2i as per above.    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  Reviewed benefits of SGLT2i such as jardiance or farxiga, as well as kidney protective effect and cardiovascular benefits.  Reviewed mechanism of action as well as side effects to watch for including but not limited to UTI, yeast infection, groin cellulitis skin infection.  Will start jardiance 10mg  daily - they will price this out.    Nasal sinus congestion Assessment & Plan: Ongoing, predominantly when he wakes up in the morning. Recommend nasal saline irrigation throughout the day, suggested nasal steroid such as Flonase OTC at night.  Reviewed mechanism of action of medicine and need to watch for nosebleeds.   Other orders -     Empagliflozin; Take 1 tablet (10 mg total) by mouth daily.  Dispense: 90 tablet; Refill: 1     I discussed the assessment and treatment plan with the patient. The patient was provided an opportunity to ask questions and all were answered. The patient agreed with the plan and demonstrated an understanding of the instructions. The patient was advised to call back or seek an in-person evaluation if the symptoms worsen or if the condition fails to improve as anticipated.  Patient Instructions  Start jardiance 10mg  daily for kidneys and diabetes.  Try OTC flonase daily for a few weeks for am sinus congestion Continue anoro elipta.  Return in January for physical/wellness visit.   Follow up plan: Return in about 24 weeks (around 09/15/2023) for annual exam, prior fasting for blood work, medicare wellness visit.  Eustaquio Boyden, MD

## 2023-03-31 NOTE — Assessment & Plan Note (Addendum)
Recent FLP stable on rosuvastatin 20mg  - continue.  The ASCVD Risk score (Arnett DK, et al., 2019) failed to calculate for the following reasons:   The valid total cholesterol range is 130 to 320 mg/dL

## 2023-03-31 NOTE — Assessment & Plan Note (Signed)
Followed by podiatry Dr Carmie End underging Qutenza capsaicin Q63mo waps with benefit.

## 2023-03-31 NOTE — Assessment & Plan Note (Signed)
GFR chronically 50s over last several years with some fluctuation. See below. Continue tight BP and sugar control. Start SGLT2i as per above.

## 2023-03-31 NOTE — Patient Instructions (Addendum)
Start jardiance 10mg  daily for kidneys and diabetes.  Try OTC flonase daily for a few weeks for am sinus congestion Continue anoro elipta.  Return in January for physical/wellness visit.

## 2023-03-31 NOTE — Assessment & Plan Note (Addendum)
Stable on latest echo, followed by cardiology at the [facility] Dr Irving Copas.  Recent lasix dose increased to 80mg  daily.

## 2023-03-31 NOTE — Assessment & Plan Note (Signed)
Just had contrasted abd CT by cardiology to monitor known AAA s/p endovascular repair

## 2023-04-10 ENCOUNTER — Encounter: Payer: Self-pay | Admitting: Family Medicine

## 2023-04-10 DIAGNOSIS — H43813 Vitreous degeneration, bilateral: Secondary | ICD-10-CM | POA: Insufficient documentation

## 2023-06-07 ENCOUNTER — Other Ambulatory Visit: Payer: Self-pay | Admitting: Family Medicine

## 2023-06-25 ENCOUNTER — Encounter: Payer: Self-pay | Admitting: Pharmacist

## 2023-06-25 ENCOUNTER — Other Ambulatory Visit: Payer: Self-pay | Admitting: Family Medicine

## 2023-06-25 MED ORDER — ROSUVASTATIN CALCIUM 20 MG PO TABS: 20.0000 mg | ORAL_TABLET | Freq: Every day | ORAL | 4 refills | Status: DC

## 2023-06-25 NOTE — Progress Notes (Signed)
Pharmacy Quality Measure Review  This patient is appearing on a report for being at risk of failing the adherence measure for cholesterol (statin) medications this calendar year.   Medication: rosuvastatin 20 mg tablet Last fill date: 03/15/20 for 90 day supply  Will collaborate with provider to facilitate refill needs.  Loree Fee, PharmD Clinical Pharmacist [facility] [phone]

## 2023-06-25 NOTE — Progress Notes (Signed)
I have refilled rosuvastatin to [facility]

## 2023-06-27 ENCOUNTER — Encounter: Payer: Self-pay | Admitting: Pharmacist

## 2023-06-27 NOTE — Progress Notes (Signed)
Pharmacy Quality Measure Review  This patient is appearing on a report for being at risk of failing the adherence measure for cholesterol (statin) medications this calendar year.   Medication: rosuvastatin 20 mg Last fill date: 10/25 for 90 day supply  Insurance report was not up to date. No action needed at this time.   Jarrett Ables, PharmD PGY-1 Pharmacy Resident

## 2023-07-05 ENCOUNTER — Telehealth: Payer: Self-pay | Admitting: Family Medicine

## 2023-07-05 NOTE — Telephone Encounter (Signed)
[place]   Went to podiatry office. Dr. Jinny Sanders They were going to send order to our office for shoes. She is aware they have not sent but has requested that they fax. Just wanted to give you a heads up and requested that we mail order to home address that I have verified in chart.

## 2023-07-05 NOTE — Telephone Encounter (Signed)
Py wife called in and stated that the fax for pt shoe will be in the week and would like a call back

## 2023-07-09 NOTE — Telephone Encounter (Signed)
Still waiting faxed orders.

## 2023-08-07 ENCOUNTER — Other Ambulatory Visit: Payer: Self-pay | Admitting: Family Medicine

## 2023-08-07 DIAGNOSIS — E118 Type 2 diabetes mellitus with unspecified complications: Secondary | ICD-10-CM

## 2023-08-09 ENCOUNTER — Other Ambulatory Visit: Payer: Self-pay | Admitting: Family Medicine

## 2023-08-09 DIAGNOSIS — I10 Essential (primary) hypertension: Secondary | ICD-10-CM

## 2023-08-10 ENCOUNTER — Other Ambulatory Visit: Payer: Self-pay | Admitting: Family Medicine

## 2023-08-13 ENCOUNTER — Other Ambulatory Visit: Payer: Self-pay | Admitting: Pharmacist

## 2023-08-13 NOTE — Progress Notes (Signed)
Pharmacy Quality Measure Review  This patient is appearing on a report for being at risk of failing the adherence measure for hypertension (ACEi/ARB) medications this calendar year.   Medication: valsartan 80 mg tablets Last fill date: 03/16/23 for 90 day supply  Wife reports that PCP prescribed valsartan initially, though cardiologist had instructed patient to take one tablet daily instead of 2 (due to well-controlled BP) though the prescription was never updated.   Medication refills for other cardiology medications were sent 12/10. Msg sent to clinic pool to also send valsartan (once daily).   Loree Fee, PharmD Clinical Pharmacist [facility] [phone]

## 2023-08-14 MED ORDER — VALSARTAN 80 MG PO TABS: 80.0000 mg | ORAL_TABLET | Freq: Every day | ORAL | 0 refills | Status: DC

## 2023-08-14 NOTE — Progress Notes (Addendum)
New valsartan Rx sent to mail order pharmacy.

## 2023-08-14 NOTE — Addendum Note (Signed)
Addended by: Eustaquio Boyden on: 08/14/2023 12:46 PM   Modules accepted: Orders

## 2023-08-31 ENCOUNTER — Other Ambulatory Visit: Payer: Self-pay | Admitting: Family Medicine

## 2023-08-31 DIAGNOSIS — E559 Vitamin D deficiency, unspecified: Secondary | ICD-10-CM

## 2023-09-02 NOTE — Telephone Encounter (Signed)
 Refill request for Vitamin D 50,000 units  LOV - 03/31/23 Next OV - 09/21/23 Last refill - 11/03/22 #12/3 Last lab -03/17/23 and was 50.0 Next lab- not scheduled  Also needs jardiance refilled

## 2023-09-02 NOTE — Telephone Encounter (Signed)
 ERx

## 2023-09-10 LAB — LAB REPORT - SCANNED: EGFR: 37.2

## 2023-09-13 ENCOUNTER — Telehealth: Payer: Self-pay

## 2023-09-13 NOTE — Transitions of Care (Post Inpatient/ED Visit) (Signed)
   09/13/2023  Name: William Hebert MRN: [id number] DOB: 1946-06-14  Today's TOC FU Call Status: Today's TOC FU Call Status:: Unsuccessful Call (1st Attempt) Unsuccessful Call (1st Attempt) Date: 09/13/23  Attempted to reach the patient regarding the most recent Inpatient/ED visit.  Follow Up Plan: Additional outreach attempts will be made to reach the patient to complete the Transitions of Care (Post Inpatient/ED visit) call.   Signature  Avelina Essex, CMA (AAMA)  [facility] [phone]

## 2023-09-15 NOTE — Transitions of Care (Post Inpatient/ED Visit) (Signed)
 09/15/2023  Name: William Hebert MRN: [id number] DOB: March 27, 1946  Today's TOC FU Call Status: Today's TOC FU Call Status:: Successful TOC FU Call Completed Unsuccessful Call (1st Attempt) Date: 09/13/23 [facility] FU Call Complete Date: 09/15/23 Patient's Name and Date of Birth confirmed.  Transition Care Management Follow-up Telephone Call Date of Discharge: 09/10/23 Discharge Facility: Other ([facility]) Name of Other ([facility]) Discharge Facility: [facility] Type of Discharge: Emergency Department Reason for ED Visit: Other: How have you been since you were released from the hospital?: Better Any questions or concerns?: No  Items Reviewed: Did you receive and understand the discharge instructions provided?: Yes Medications obtained,verified, and reconciled?: Yes (Medications Reviewed) Any new allergies since your discharge?: No Dietary orders reviewed?: NA Do you have support at home?: Yes People in Home: significant other  Medications Reviewed Today: Medications Reviewed Today     Reviewed by Chauncy Coral, CMA (Certified Medical Assistant) on 09/15/23 at 1654  Med List Status: <None>   Medication Order Taking? Sig Documenting Provider Last Dose Status Informant  Accu-Chek Softclix Lancets lancets [id number]  Check blood sugar once daily Claire Crick, MD  Active   acetaminophen  (TYLENOL ) 500 MG tablet [id number]  Take 1,000 mg by mouth every 6 (six) hours as needed for moderate pain or mild pain. [provider]  Active Self  apixaban (ELIQUIS) 5 MG TABS tablet [id number]  Take 5 mg by mouth 2 (two) times daily. [provider]  Active   Ascorbic Acid (VITAMIN C PO) [id number]  Take 1,000 mg by mouth daily. Super C [provider]  Active Self  aspirin  EC 81 MG tablet [id number]  Take 1 tablet (81 mg total) by mouth daily. Day after surgery Orvan Blanch, MD  Active   B Complex-C (SUPER B COMPLEX PO) [id number]  Take 1 tablet by mouth  daily. [provider]  Active Self  Blood Glucose Monitoring Suppl (ACCU-CHEK GUIDE) w/Device KIT [id number]  Use as instructed to check blood sugar once daily. Claire Crick, MD  Active Self  Cholecalciferol (VITAMIN D3) 1.25 MG (50000 UT) CAPS [id number]  TAKE 1 CAPSULE BY MOUTH EVERY FRIDAY IN THE Melinda Sprawls, MD  Active   doxycycline  (ADOXA) 100 MG tablet [id number] Yes Take 100 mg by mouth 2 (two) times daily. [provider] Taking Active   furosemide  (LASIX ) 40 MG tablet [id number]  Take 2 tablets (80 mg total) by mouth daily. Claire Crick, MD  Active   glucose blood (ACCU-CHEK AVIVA PLUS) test strip [id number]  Use to check blood sugar once daily Claire Crick, MD  Active   JARDIANCE  10 MG TABS tablet [id number]  TAKE 1 TABLET BY MOUTH EVERY DAY Claire Crick, MD  Active   labetalol  (NORMODYNE ) 200 MG tablet [id number]  TAKE 1 TABLET BY MOUTH DAILY Claire Crick, MD  Active   metFORMIN  (GLUCOPHAGE ) 500 MG tablet [id number]  TAKE 1 TABLET BY MOUTH DAILY  WITH Cathay Clonts, MD  Active   Multiple Vitamins-Minerals (MULTIVITAMIN ADULT PO) [id number]  Take 1 tablet by mouth daily. MEN'S 50+ MULTIVITAMIN. [provider]  Active Self  NIFEdipine  (PROCARDIA  XL/NIFEDICAL XL) 60 MG 24 hr tablet [id number]  TAKE 1 TABLET BY MOUTH DAILY Claire Crick, MD  Active   omeprazole  (PRILOSEC) 40 MG capsule [id number]  TAKE 1 CAPSULE BY MOUTH DAILY Claire Crick, MD  Active   potassium chloride  SA (KLOR-CON  M) 20 MEQ tablet [id number]  TAKE 1 TABLET BY MOUTH DAILY Claire Crick, MD  Active  predniSONE  (DELTASONE ) 20 MG tablet [id number] Yes Take 20 mg by mouth 2 (two) times daily with a meal. [provider] Taking Active   rosuvastatin  (CRESTOR ) 20 MG tablet [id number]  Take 1 tablet (20 mg total) by mouth daily. Claire Crick, MD  Active   umeclidinium-vilanterol (ANORO ELLIPTA ) 62.5-25 MCG/ACT AEPB [id number]  Inhale 1  puff into the lungs daily. Prudy Brownie, DO  Active   valsartan  (DIOVAN ) 80 MG tablet [id number]  Take 1 tablet (80 mg total) by mouth daily. Claire Crick, MD  Active   Zinc  100 MG TABS [id number]  Take 1 tablet (100 mg total) by mouth daily. Claire Crick, MD  Active Self            Home Care and Equipment/Supplies: Were Home Health Services Ordered?: NA Any new equipment or medical supplies ordered?: NA  Functional Questionnaire: Do you need assistance with bathing/showering or dressing?: No Do you need assistance with meal preparation?: No Do you need assistance with eating?: No Do you have difficulty maintaining continence: No Do you need assistance with getting out of bed/getting out of a chair/moving?: No Do you have difficulty managing or taking your medications?: No  Follow up appointments reviewed: PCP Follow-up appointment confirmed?: Yes Date of PCP follow-up appointment?: 09/21/23 Follow-up Provider: Dr. Mariam Shingles [facility] Follow-up appointment confirmed?: NA Do you need transportation to your follow-up appointment?: No Do you understand care options if your condition(s) worsen?: Yes-patient verbalized understanding    SIGNATURE Germain Kohler, CMA (AAMA)  [facility] [phone]

## 2023-09-21 ENCOUNTER — Encounter: Payer: Self-pay | Admitting: Family Medicine

## 2023-09-21 ENCOUNTER — Ambulatory Visit
Admission: RE | Admit: 2023-09-21 | Discharge: 2023-09-21 | Disposition: A | Discharge status: Home / Self Care | Payer: Medicare Other | Source: Ambulatory Visit | Attending: Family Medicine | Admitting: Family Medicine

## 2023-09-21 ENCOUNTER — Ambulatory Visit: Payer: Medicare Other | Admitting: Family Medicine

## 2023-09-21 VITALS — BP 136/68 | HR 87 | Temp 97.8°F | Ht 70.75 in | Wt 323.2 lb

## 2023-09-21 DIAGNOSIS — I5032 Chronic diastolic (congestive) heart failure: Secondary | ICD-10-CM

## 2023-09-21 DIAGNOSIS — E785 Hyperlipidemia, unspecified: Secondary | ICD-10-CM

## 2023-09-21 DIAGNOSIS — Z7984 Long term (current) use of oral hypoglycemic drugs: Secondary | ICD-10-CM | POA: Diagnosis not present

## 2023-09-21 DIAGNOSIS — J449 Chronic obstructive pulmonary disease, unspecified: Secondary | ICD-10-CM

## 2023-09-21 DIAGNOSIS — Z87891 Personal history of nicotine dependence: Secondary | ICD-10-CM

## 2023-09-21 DIAGNOSIS — Z125 Encounter for screening for malignant neoplasm of prostate: Secondary | ICD-10-CM

## 2023-09-21 DIAGNOSIS — J189 Pneumonia, unspecified organism: Secondary | ICD-10-CM | POA: Diagnosis not present

## 2023-09-21 DIAGNOSIS — E113293 Type 2 diabetes mellitus with mild nonproliferative diabetic retinopathy without macular edema, bilateral: Secondary | ICD-10-CM

## 2023-09-21 DIAGNOSIS — Z0001 Encounter for general adult medical examination with abnormal findings: Secondary | ICD-10-CM | POA: Diagnosis not present

## 2023-09-21 DIAGNOSIS — Z1211 Encounter for screening for malignant neoplasm of colon: Secondary | ICD-10-CM

## 2023-09-21 DIAGNOSIS — Z8679 Personal history of other diseases of the circulatory system: Secondary | ICD-10-CM

## 2023-09-21 DIAGNOSIS — N183 Chronic kidney disease, stage 3 unspecified: Secondary | ICD-10-CM | POA: Diagnosis not present

## 2023-09-21 DIAGNOSIS — E559 Vitamin D deficiency, unspecified: Secondary | ICD-10-CM

## 2023-09-21 DIAGNOSIS — K219 Gastro-esophageal reflux disease without esophagitis: Secondary | ICD-10-CM

## 2023-09-21 DIAGNOSIS — E1122 Type 2 diabetes mellitus with diabetic chronic kidney disease: Secondary | ICD-10-CM

## 2023-09-21 DIAGNOSIS — E1169 Type 2 diabetes mellitus with other specified complication: Secondary | ICD-10-CM

## 2023-09-21 DIAGNOSIS — Z7189 Other specified counseling: Secondary | ICD-10-CM

## 2023-09-21 DIAGNOSIS — R911 Solitary pulmonary nodule: Secondary | ICD-10-CM

## 2023-09-21 DIAGNOSIS — R0981 Nasal congestion: Secondary | ICD-10-CM | POA: Diagnosis not present

## 2023-09-21 DIAGNOSIS — E1121 Type 2 diabetes mellitus with diabetic nephropathy: Secondary | ICD-10-CM

## 2023-09-21 DIAGNOSIS — I480 Paroxysmal atrial fibrillation: Secondary | ICD-10-CM

## 2023-09-21 DIAGNOSIS — Z9889 Other specified postprocedural states: Secondary | ICD-10-CM

## 2023-09-21 DIAGNOSIS — G4733 Obstructive sleep apnea (adult) (pediatric): Secondary | ICD-10-CM

## 2023-09-21 DIAGNOSIS — I10 Essential (primary) hypertension: Secondary | ICD-10-CM

## 2023-09-21 DIAGNOSIS — E118 Type 2 diabetes mellitus with unspecified complications: Secondary | ICD-10-CM

## 2023-09-21 LAB — POCT GLYCOSYLATED HEMOGLOBIN (HGB A1C)
HbA1c POC (<> result, manual entry): 7.6 % (ref 4.0–5.6)
Hemoglobin A1C: 7 % — AB (ref 4.0–5.6)

## 2023-09-21 MED ORDER — AMOXICILLIN-POT CLAVULANATE 875-125 MG PO TABS: 1.0000 | ORAL_TABLET | Freq: Two times a day (BID) | ORAL | 0 refills | Status: DC

## 2023-09-21 MED ORDER — METFORMIN HCL 500 MG PO TABS: 500.0000 mg | ORAL_TABLET | Freq: Every day | ORAL | 4 refills | Status: DC

## 2023-09-21 MED ORDER — VALSARTAN 80 MG PO TABS: 80.0000 mg | ORAL_TABLET | Freq: Every day | ORAL | 4 refills | Status: DC

## 2023-09-21 MED ORDER — NIFEDIPINE ER OSMOTIC RELEASE 60 MG PO TB24: 60.0000 mg | ORAL_TABLET | Freq: Every day | ORAL | 4 refills | Status: DC

## 2023-09-21 MED ORDER — POTASSIUM CHLORIDE CRYS ER 20 MEQ PO TBCR: 20.0000 meq | EXTENDED_RELEASE_TABLET | Freq: Every day | ORAL | 4 refills | Status: DC

## 2023-09-21 MED ORDER — OMEPRAZOLE 40 MG PO CPDR: 40.0000 mg | DELAYED_RELEASE_CAPSULE | Freq: Every day | ORAL | 4 refills | Status: DC

## 2023-09-21 NOTE — Progress Notes (Signed)
Ph: [phone] Fax: [phone]   Patient ID: [facility], male    DOB: 12-Apr-1946, 78 y.o.   MRN: [id number]  This visit was conducted in person.  BP 136/68   Pulse 87   Temp 97.8 F (36.6 C) (Oral)   Ht 5' 10.75" (1.797 m)   Wt (!) 323 lb 4 oz (146.6 kg)   SpO2 96%   BMI 45.40 kg/m    CC: CPE Subjective:   HPI: [facility] is a 78 y.o. male presenting on 09/21/2023 for Medicare Wellness   Did not see health advisor this year. Does have upcoming appt 09/28/2023.   Hearing Screening   500Hz  1000Hz  2000Hz  4000Hz   Right ear 25 40 25 0  Left ear 25 25 25  0  Vision Screening - Comments:: Last eye exam, 11/2022.  Flowsheet Row Office Visit from 09/21/2023 in [facility]  PHQ-2 Total Score 0          09/21/2023    3:07 PM 03/31/2023    2:27 PM 07/20/2022    3:51 PM 07/18/2021    2:25 PM 06/26/2020    3:37 PM  Fall Risk   Falls in the past year? 0 0 0 0 0  Number falls in past yr:     0  Injury with Fall?     0  Risk for fall due to :     Medication side effect  Follow up   Falls evaluation completed  Falls evaluation completed;Falls prevention discussed   R RTC repair 2022 ([facility]).   Recent ER visit at [facility] for pneumonia, treated with doxycycline + prednisone taper.  Productive cough ongoing for 1 month. No fevers.  Records reviewed - BNP 60, UA with 3+ protein and glucose, 1+ bili, neg LE and blood but elevated ascorbic acid which could result in false negatives. Bact 1+. K 4.2, lu 162, CO2 22, Cr 1.79, est CrCl 51.52, eGFR 37.2, AST/ALT/ALP WNL, TnI 0.01, WBC 12, Hgb 15, plt 227.    More trouble using CPAP with recent respiratory infection but also present prior.   DM - doesn't regularly check sugars - no recent check. On jardiance 10mg  and metformin 500mg  daily. He avoids sweet tea. Drinks mio instead.   CHF with diastolic dysfunction - takes lasix 80mg  daily with extra if needed.   Recently found RUL subsolid  nodule - just completed super lung CT scan 03/30/2023 - B upper lobe nonsolid nodules (RUL max diameter 10mm, stable), reticular opacities with areas of bronchiectasis to bilateral lower lobes. Seeing [facility]monology with planned continued monitoring.    Found to have afib 05/2022, now on eliquis in addition to labetalol and regular aspirin. He previously declined cardioversion.    Preventative: COLONOSCOPY WITH PROPOFOL 02/23/2017 - 4 TA polyps, rpt 3-5 yrs [facility]) - DUE request referral to general surgery at the [facility] ([facility]) who sees the wife Prostate cancer screening - yearly PSA.  Lung cancer screening - see above.  Flu shot - not done  COVID vaccine Pfizer 05/2020, 07/2020, no boosters Td 2015  Pneumovax 2017, prevnar-13 2015  Shingrix - to consider  Advanced directive discussion - would want daughter William [facility] to be HCPOA - also wife. Packet previously provided - encouraged he work on this.  Seat belt use discussed  Sunscreen use discussed. No changing moles on skin.  Ex smoker - quit ~2007. Prior smoked 2 ppd. ~75 PY hx Alcohol - rare Dentist -  Q69mo  Eye exam yearly at the [facility] Bowel - no constipation Bladder - no incontinence    Lives with wife Occ: semi-retired Biochemist, clinical, works for [employer] Edu: HS Activity: swimming 3x/wk Diet: no water, 1/2 gallon sweet tea - decreasing amt sugar in it though, some fruits/vegetables      Relevant past medical, surgical, family and social history reviewed and updated as indicated. Interim medical history since our last visit reviewed. Allergies and medications reviewed and updated. Outpatient Medications Prior to Visit  Medication Sig Dispense Refill   Accu-Chek Softclix Lancets lancets Check blood sugar once daily 100 each 1   acetaminophen (TYLENOL) 500 MG tablet Take 1,000 mg by mouth every 6 (six) hours as needed for moderate pain or mild pain.     apixaban (ELIQUIS) 5 MG TABS tablet Take 5 mg by mouth 2  (two) times daily.     Ascorbic Acid (VITAMIN C PO) Take 1,000 mg by mouth daily. Super C     aspirin EC 81 MG tablet Take 1 tablet (81 mg total) by mouth daily. Day after surgery 30 tablet 1   B Complex-C (SUPER B COMPLEX PO) Take 1 tablet by mouth daily.     Blood Glucose Monitoring Suppl (ACCU-CHEK GUIDE) w/Device KIT Use as instructed to check blood sugar once daily. 1 kit 0   Cholecalciferol (VITAMIN D3) 1.25 MG (50000 UT) CAPS TAKE 1 CAPSULE BY MOUTH EVERY FRIDAY IN THE MORNING 12 capsule 1   doxycycline (ADOXA) 100 MG tablet Take 100 mg by mouth 2 (two) times daily.     furosemide (LASIX) 40 MG tablet Take 2 tablets (80 mg total) by mouth daily.     glucose blood (ACCU-CHEK AVIVA PLUS) test strip Use to check blood sugar once daily 100 each 1   Multiple Vitamins-Minerals (MULTIVITAMIN ADULT PO) Take 1 tablet by mouth daily. MEN'S 50+ MULTIVITAMIN.     rosuvastatin (CRESTOR) 20 MG tablet Take 1 tablet (20 mg total) by mouth daily. 90 tablet 4   umeclidinium-vilanterol (ANORO ELLIPTA) 62.5-25 MCG/ACT AEPB Inhale 1 puff into the lungs daily. 60 each 5   Zinc 100 MG TABS Take 1 tablet (100 mg total) by mouth daily. 90 tablet 3   JARDIANCE 10 MG TABS tablet TAKE 1 TABLET BY MOUTH EVERY DAY 90 tablet 1   labetalol (NORMODYNE) 200 MG tablet TAKE 1 TABLET BY MOUTH DAILY 90 tablet 0   metFORMIN (GLUCOPHAGE) 500 MG tablet TAKE 1 TABLET BY MOUTH DAILY  WITH BREAKFAST 90 tablet 0   NIFEdipine (PROCARDIA XL/NIFEDICAL XL) 60 MG 24 hr tablet TAKE 1 TABLET BY MOUTH DAILY 90 tablet 0   omeprazole (PRILOSEC) 40 MG capsule TAKE 1 CAPSULE BY MOUTH DAILY 90 capsule 1   potassium chloride SA (KLOR-CON M) 20 MEQ tablet TAKE 1 TABLET BY MOUTH DAILY 90 tablet 0   valsartan (DIOVAN) 80 MG tablet Take 1 tablet (80 mg total) by mouth daily. 90 tablet 0   predniSONE (DELTASONE) 20 MG tablet Take 20 mg by mouth 2 (two) times daily with a meal.     No facility-administered medications prior to visit.     Per HPI  unless specifically indicated in ROS section below Review of Systems  Constitutional:  Negative for activity change, appetite change, chills, fatigue, fever and unexpected weight change.  HENT:  Negative for hearing loss.   Eyes:  Negative for visual disturbance.  Respiratory:  Positive for cough and shortness of breath. Negative for chest tightness and wheezing.  Cardiovascular:  Positive for leg swelling. Negative for chest pain and palpitations.  Gastrointestinal:  Positive for diarrhea, nausea and vomiting. Negative for abdominal distention, abdominal pain, blood in stool and constipation.  Genitourinary:  Negative for difficulty urinating and hematuria.  Musculoskeletal:  Negative for arthralgias, myalgias and neck pain.  Skin:  Negative for rash.  Neurological:  Negative for dizziness, seizures, syncope and headaches.  Hematological:  Negative for adenopathy. Does not bruise/bleed easily.  Psychiatric/Behavioral:  Negative for dysphoric mood. The patient is not nervous/anxious.     Objective:  BP 136/68   Pulse 87   Temp 97.8 F (36.6 C) (Oral)   Ht 5' 10.75" (1.797 m)   Wt (!) 323 lb 4 oz (146.6 kg)   SpO2 96%   BMI 45.40 kg/m   Wt Readings from Last 3 Encounters:  09/21/23 (!) 323 lb 4 oz (146.6 kg)  03/31/23 (!) 330 lb 5 oz (149.8 kg)  07/20/22 (!) 340 lb (154.2 kg)      Physical Exam Vitals and nursing note reviewed.  Constitutional:      General: He is not in acute distress.    Appearance: Normal appearance. He is well-developed. He is not ill-appearing.  HENT:     Head: Normocephalic and atraumatic.     Right Ear: Hearing, tympanic membrane, ear canal and external ear normal.     Left Ear: Hearing, tympanic membrane, ear canal and external ear normal.     Mouth/Throat:     Mouth: Mucous membranes are moist.     Pharynx: Oropharynx is clear. No oropharyngeal exudate or posterior oropharyngeal erythema.  Eyes:     General: No scleral icterus.    Extraocular  Movements: Extraocular movements intact.     Conjunctiva/sclera: Conjunctivae normal.     Pupils: Pupils are equal, round, and reactive to light.  Neck:     Thyroid: No thyroid mass or thyromegaly.  Cardiovascular:     Rate and Rhythm: Normal rate and regular rhythm.     Pulses: Normal pulses.          Radial pulses are 2+ on the right side and 2+ on the left side.     Heart sounds: Normal heart sounds. No murmur heard. Pulmonary:     Effort: Pulmonary effort is normal. No respiratory distress.     Breath sounds: Normal breath sounds. No wheezing, rhonchi or rales.  Abdominal:     General: Bowel sounds are normal. There is no distension.     Palpations: Abdomen is soft. There is no mass.     Tenderness: There is no abdominal tenderness. There is no guarding or rebound.     Hernia: No hernia is present.  Musculoskeletal:        General: Normal range of motion.     Cervical back: Normal range of motion and neck supple.     Right lower leg: No edema.     Left lower leg: No edema.  Lymphadenopathy:     Cervical: No cervical adenopathy.  Skin:    General: Skin is warm and dry.     Findings: No rash.  Neurological:     General: No focal deficit present.     Mental Status: He is alert and oriented to person, place, and time.  Psychiatric:        Mood and Affect: Mood normal.        Behavior: Behavior normal.        Thought Content: Thought content normal.  Judgment: Judgment normal.       Results for orders placed or performed in visit on 09/21/23  Lipid panel   Collection Time: 09/21/23  4:05 PM  Result Value Ref Range   Cholesterol 114 0 - 200 mg/dL   Triglycerides 865.7 (H) 0.0 - 149.0 mg/dL   HDL 84.69 (L) >62.95 mg/dL   VLDL 28.4 (H) 0.0 - 13.2 mg/dL   LDL Cholesterol 29 0 - 99 mg/dL   Total CHOL/HDL Ratio 4    NonHDL 83.29   Comprehensive metabolic panel   Collection Time: 09/21/23  4:05 PM  Result Value Ref Range   Sodium 140 135 - 145 mEq/L   Potassium 3.7  3.5 - 5.1 mEq/L   Chloride 102 96 - 112 mEq/L   CO2 28 19 - 32 mEq/L   Glucose, Bld 226 (H) 70 - 99 mg/dL   BUN 30 (H) 6 - 23 mg/dL   Creatinine, Ser 4.40 (H) 0.40 - 1.50 mg/dL   Total Bilirubin 0.4 0.2 - 1.2 mg/dL   Alkaline Phosphatase 77 39 - 117 U/L   AST 14 0 - 37 U/L   ALT 20 0 - 53 U/L   Total Protein 5.6 (L) 6.0 - 8.3 g/dL   Albumin 3.7 3.5 - 5.2 g/dL   GFR 10.27 (L) >25.36 mL/min   Calcium 9.0 8.4 - 10.5 mg/dL  Phosphorus   Collection Time: 09/21/23  4:05 PM  Result Value Ref Range   Phosphorus 3.4 2.3 - 4.6 mg/dL  VITAMIN D 25 Hydroxy (Vit-D Deficiency, Fractures)   Collection Time: 09/21/23  4:05 PM  Result Value Ref Range   VITD 58.14 30.00 - 100.00 ng/mL  Parathyroid hormone, intact (no Ca)   Collection Time: 09/21/23  4:05 PM  Result Value Ref Range   PTH 23 16 - 77 pg/mL  CBC with Differential/Platelet   Collection Time: 09/21/23  4:05 PM  Result Value Ref Range   WBC 7.9 4.0 - 10.5 K/uL   RBC 4.75 4.22 - 5.81 Mil/uL   Hemoglobin 14.1 13.0 - 17.0 g/dL   HCT 64.4 03.4 - 74.2 %   MCV 91.2 78.0 - 100.0 fl   MCHC 32.5 30.0 - 36.0 g/dL   RDW 59.5 63.8 - 75.6 %   Platelets 175.0 150.0 - 400.0 K/uL   Neutrophils Relative % 74.2 43.0 - 77.0 %   Lymphocytes Relative 13.8 12.0 - 46.0 %   Monocytes Relative 8.5 3.0 - 12.0 %   Eosinophils Relative 2.6 0.0 - 5.0 %   Basophils Relative 0.9 0.0 - 3.0 %   Neutro Abs 5.8 1.4 - 7.7 K/uL   Lymphs Abs 1.1 0.7 - 4.0 K/uL   Monocytes Absolute 0.7 0.1 - 1.0 K/uL   Eosinophils Absolute 0.2 0.0 - 0.7 K/uL   Basophils Absolute 0.1 0.0 - 0.1 K/uL  Microalbumin / creatinine urine ratio   Collection Time: 09/21/23  4:05 PM  Result Value Ref Range   Microalb, Ur 92.4 (H) 0.0 - 1.9 mg/dL   Creatinine,U 433.2 mg/dL   Microalb Creat Ratio 53.5 (H) 0.0 - 30.0 mg/g  PSA   Collection Time: 09/21/23  4:05 PM  Result Value Ref Range   PSA 1.08 0.10 - 4.00 ng/mL  POCT glycosylated hemoglobin (Hb A1C)   Collection Time: 09/21/23   4:19 PM  Result Value Ref Range   Hemoglobin A1C 7.0 (A) 4.0 - 5.6 %   HbA1c POC (<> result, manual entry) 7.6 4.0 - 5.6 %   HbA1c,  POC (prediabetic range)     HbA1c, POC (controlled diabetic range)      Assessment & Plan:   Problem List Items Addressed This Visit     Encounter for general adult medical examination with abnormal findings - Primary (Chronic)   Preventative protocols reviewed and updated unless pt declined. Discussed healthy diet and lifestyle.       Advanced care planning/counseling discussion (Chronic)   Advanced directive packet provided today.       Ex-smoker   Quit smoking 2007. See below regarding lung cancer screening .      Obstructive sleep apnea   More recently noticing trouble tolerating CPAP machine.       Dyslipidemia associated with type 2 diabetes mellitus (HCC)   Continue rosuvastatin 20mg  daily - update FLP today. The ASCVD Risk score (Arnett DK, et al., 2019) failed to calculate for the following reasons:   The valid total cholesterol range is 130 to 320 mg/dL       Relevant Medications   metFORMIN (GLUCOPHAGE) 500 MG tablet   valsartan (DIOVAN) 80 MG tablet   empagliflozin (JARDIANCE) 10 MG TABS tablet   Other Relevant Orders   Lipid panel (Completed)   Comprehensive metabolic panel (Completed)   Hypertension   Chronic, well controlled on current regimen including labetalol 200mg  daily, valsartan 80mg  daily, procardia XL 60mg  daily and lasix 80mg  daily.       Relevant Medications   NIFEdipine (PROCARDIA XL/NIFEDICAL XL) 60 MG 24 hr tablet   valsartan (DIOVAN) 80 MG tablet   labetalol (NORMODYNE) 200 MG tablet   S/P AAA (abdominal aortic aneurysm) repair   Continues seeing [facility]      Obesity, morbid, BMI 40.0-49.9 (HCC)   Continue to encourage healthy diet and lifestyle choices for sustainable weight loss.       Relevant Medications   metFORMIN (GLUCOPHAGE) 500 MG tablet   empagliflozin (JARDIANCE) 10 MG TABS tablet   Vitamin  D deficiency   Update levels on 50000 international units weekly.       Relevant Orders   VITAMIN D 25 Hydroxy (Vit-D Deficiency, Fractures) (Completed)   Type 2 diabetes mellitus with other specified complication (HCC)   Chronic, adequate control on current regimen including jardiance 10mg  daily and metformin 500mg  daily.  He does not check sugars at home.  Update labs, refill meds.       Relevant Medications   metFORMIN (GLUCOPHAGE) 500 MG tablet   valsartan (DIOVAN) 80 MG tablet   empagliflozin (JARDIANCE) 10 MG TABS tablet   Other Relevant Orders   Microalbumin / creatinine urine ratio (Completed)   POCT glycosylated hemoglobin (Hb A1C) (Completed)   Diastolic dysfunction with chronic heart failure [facility])   Appreciate cardiology care - sees cardiologist at [facility].       Relevant Medications   NIFEdipine (PROCARDIA XL/NIFEDICAL XL) 60 MG 24 hr tablet   valsartan (DIOVAN) 80 MG tablet   labetalol (NORMODYNE) 200 MG tablet   COPD (chronic obstructive pulmonary disease) (HCC)   Continue Anoro Ellipta.       Nasal sinus congestion   Rec regualr flonase use.       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        Relevant Medications   NIFEdipine (PROCARDIA XL/NIFEDICAL XL) 60 MG 24 hr tablet   valsartan (DIOVAN) 80 MG tablet   labetalol (NORMODYNE) 200 MG tablet   CKD stage 3 due to type 2 diabetes mellitus (HCC)   Update renal panel. Jardiance started last July 2024.       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       Relevant Medications   metFORMIN (GLUCOPHAGE) 500 MG tablet   valsartan (DIOVAN) 80 MG tablet   empagliflozin (JARDIANCE) 10 MG TABS tablet   Gastroesophageal reflux disease without esophagitis   Continues omeprazole 40mg  daily.       Relevant Medications   omeprazole (PRILOSEC) 40 MG capsule   Community acquired pneumonia   Recent ER records reviewed - completed prednisone and doxycycline course. Ongoing cough, started >1 month ago, lungs clear and afebrile. Given duration of cough, will update CXR today along with labwork, Rx augmentin course for possible sinusitis component,  restart flonase. Update if not improving with treatment.       Relevant Medications   amoxicillin-clavulanate (AUGMENTIN) 875-125 MG tablet   Other Relevant Orders   DG Chest 2 View (Completed)   Pulmonary nodule, right   RUL subsolid nodule - was seeing local pulmonologist [facility] who has since left the practice. Last chest CT 01/2023 - planned rpt 6-12 months.  They may be interested in establishing with local pulmonologist at the [facility].  Will let me know who they want referral sent to.       Other Visit Diagnoses       Special screening for malignant neoplasm of prostate       Relevant Orders   PSA (Completed)     Special screening for malignant neoplasms, colon       Relevant Orders   Ambulatory referral to Gastroenterology        Meds ordered this encounter  Medications   metFORMIN (GLUCOPHAGE) 500 MG tablet    Sig: Take 1 tablet  (500 mg total) by mouth daily with breakfast.    Dispense:  90 tablet    Refill:  4   NIFEdipine (PROCARDIA XL/NIFEDICAL XL) 60 MG 24 hr tablet    Sig: Take 1 tablet (60 mg total) by mouth daily.    Dispense:  90 tablet    Refill:  4   omeprazole (PRILOSEC) 40 MG capsule    Sig: Take 1 capsule (40 mg total) by mouth daily.    Dispense:  90 capsule    Refill:  4   potassium chloride SA (KLOR-CON M) 20 MEQ tablet    Sig: Take 1 tablet (20 mEq total) by mouth daily.    Dispense:  90 tablet    Refill:  4   valsartan (DIOVAN) 80 MG tablet    Sig: Take 1 tablet (80 mg total) by mouth daily.    Dispense:  90 tablet    Refill:  4   amoxicillin-clavulanate (AUGMENTIN) 875-125 MG tablet    Sig: Take 1 tablet by mouth 2 (two) times daily for 10 days.    Dispense:  20 tablet    Refill:  0   empagliflozin (JARDIANCE) 10 MG TABS tablet    Sig: Take 1 tablet (10 mg total) by mouth daily.    Dispense:  90 tablet    Refill:  4   labetalol (NORMODYNE) 200 MG tablet    Sig: Take 1 tablet (200 mg total) by mouth daily.    Dispense:  90 tablet    Refill:  4    Orders Placed This Encounter  Procedures   DG Chest 2 View    Standing Status:   Future    Number of Occurrences:   1    Expiration Date:   09/20/2024    Reason for Exam (SYMPTOM  OR DIAGNOSIS REQUIRED):   f/u pneumonia, LLL crackles    Preferred imaging location?:   [facility]   Lipid panel   Comprehensive metabolic panel   Phosphorus   VITAMIN D 25 Hydroxy (Vit-D Deficiency, Fractures)   Parathyroid hormone, intact (no Ca)   CBC with Differential/Platelet   Microalbumin / creatinine urine ratio   PSA   Ambulatory referral to Gastroenterology    Referral Priority:   Routine    Referral Type:   Consultation    Referral Reason:   Specialty Services Required    Number of Visits Requested:   1   POCT glycosylated hemoglobin (Hb A1C)    Patient Instructions  Labs today  Xray today Start augmentin antibiotic to cover  pneumonia.  Referral to GI - to discuss repeat colonoscopy. Let me know office contact info/doctor ([facility] gen surgeon) If interested, check with pharmacy about new 2 shot shingles series (shingrix).  Work on living will - packet provided today  Start flonase nasal steroid 1-2 squirts into each nostril daily  Good to see you today Return as needed or in 6 months for follow up visit (virtual visit).   Follow up plan: Return if symptoms worsen or fail to improve.  Eustaquio Boyden, MD

## 2023-09-21 NOTE — Patient Instructions (Addendum)
Labs today  Xray today Start augmentin antibiotic to cover pneumonia.  Referral to GI - to discuss repeat colonoscopy. Let me know office contact info/doctor (Dr Maren [facility] gen surgeon) If interested, check with pharmacy about new 2 shot shingles series (shingrix).  Work on living will - packet provided today  Start flonase nasal steroid 1-2 squirts into each nostril daily  Good to see you today Return as needed or in 6 months for follow up visit (virtual visit).

## 2023-09-22 LAB — CBC WITH DIFFERENTIAL/PLATELET
Basophils Absolute: 0.1 10*3/uL (ref 0.0–0.1)
Basophils Relative: 0.9 % (ref 0.0–3.0)
Eosinophils Absolute: 0.2 10*3/uL (ref 0.0–0.7)
Eosinophils Relative: 2.6 % (ref 0.0–5.0)
HCT: 43.3 % (ref 39.0–52.0)
Hemoglobin: 14.1 g/dL (ref 13.0–17.0)
Lymphocytes Relative: 13.8 % (ref 12.0–46.0)
Lymphs Abs: 1.1 10*3/uL (ref 0.7–4.0)
MCHC: 32.5 g/dL (ref 30.0–36.0)
MCV: 91.2 fl (ref 78.0–100.0)
Monocytes Absolute: 0.7 10*3/uL (ref 0.1–1.0)
Monocytes Relative: 8.5 % (ref 3.0–12.0)
Neutro Abs: 5.8 10*3/uL (ref 1.4–7.7)
Neutrophils Relative %: 74.2 % (ref 43.0–77.0)
Platelets: 175 10*3/uL (ref 150.0–400.0)
RBC: 4.75 Mil/uL (ref 4.22–5.81)
RDW: 15.1 % (ref 11.5–15.5)
WBC: 7.9 10*3/uL (ref 4.0–10.5)

## 2023-09-22 LAB — COMPREHENSIVE METABOLIC PANEL WITH GFR
ALT: 20 U/L (ref 0–53)
AST: 14 U/L (ref 0–37)
Albumin: 3.7 g/dL (ref 3.5–5.2)
Alkaline Phosphatase: 77 U/L (ref 39–117)
BUN: 30 mg/dL — ABNORMAL HIGH (ref 6–23)
CO2: 28 meq/L (ref 19–32)
Calcium: 9 mg/dL (ref 8.4–10.5)
Chloride: 102 meq/L (ref 96–112)
Creatinine, Ser: 1.79 mg/dL — ABNORMAL HIGH (ref 0.40–1.50)
GFR: 36.49 mL/min — ABNORMAL LOW
Glucose, Bld: 226 mg/dL — ABNORMAL HIGH (ref 70–99)
Potassium: 3.7 meq/L (ref 3.5–5.1)
Sodium: 140 meq/L (ref 135–145)
Total Bilirubin: 0.4 mg/dL (ref 0.2–1.2)
Total Protein: 5.6 g/dL — ABNORMAL LOW (ref 6.0–8.3)

## 2023-09-22 LAB — LIPID PANEL
Cholesterol: 114 mg/dL (ref 0–200)
HDL: 31.2 mg/dL — ABNORMAL LOW
LDL Cholesterol: 29 mg/dL (ref 0–99)
NonHDL: 83.29
Total CHOL/HDL Ratio: 4
Triglycerides: 270 mg/dL — ABNORMAL HIGH (ref 0.0–149.0)
VLDL: 54 mg/dL — ABNORMAL HIGH (ref 0.0–40.0)

## 2023-09-22 LAB — PHOSPHORUS: Phosphorus: 3.4 mg/dL (ref 2.3–4.6)

## 2023-09-22 LAB — MICROALBUMIN / CREATININE URINE RATIO
Creatinine,U: 172.8 mg/dL
Microalb Creat Ratio: 53.5 mg/g — ABNORMAL HIGH (ref 0.0–30.0)
Microalb, Ur: 92.4 mg/dL — ABNORMAL HIGH (ref 0.0–1.9)

## 2023-09-22 LAB — VITAMIN D 25 HYDROXY (VIT D DEFICIENCY, FRACTURES): VITD: 58.14 ng/mL (ref 30.00–100.00)

## 2023-09-22 LAB — PARATHYROID HORMONE, INTACT (NO CA): PTH: 23 pg/mL (ref 16–77)

## 2023-09-22 LAB — PSA: PSA: 1.08 ng/mL (ref 0.10–4.00)

## 2023-09-26 DIAGNOSIS — J189 Pneumonia, unspecified organism: Secondary | ICD-10-CM | POA: Insufficient documentation

## 2023-09-26 DIAGNOSIS — R911 Solitary pulmonary nodule: Secondary | ICD-10-CM | POA: Insufficient documentation

## 2023-09-26 DIAGNOSIS — K219 Gastro-esophageal reflux disease without esophagitis: Secondary | ICD-10-CM | POA: Insufficient documentation

## 2023-09-26 HISTORY — DX: Pneumonia, unspecified organism: J18.9

## 2023-09-26 MED ORDER — LABETALOL HCL 200 MG PO TABS: 200.0000 mg | ORAL_TABLET | Freq: Every day | ORAL | 4 refills | Status: DC

## 2023-09-26 MED ORDER — EMPAGLIFLOZIN 10 MG PO TABS: 10.0000 mg | ORAL_TABLET | Freq: Every day | ORAL | 4 refills | Status: DC

## 2023-09-26 NOTE — Assessment & Plan Note (Addendum)
Recent ER records reviewed - completed prednisone and doxycycline course. Ongoing cough, started >1 month ago, lungs clear and afebrile. Given duration of cough, will update CXR today along with labwork, Rx augmentin course for possible sinusitis component,  restart flonase. Update if not improving with treatment.

## 2023-09-26 NOTE — Assessment & Plan Note (Signed)
Continue to encourage healthy diet and lifestyle choices for sustainable weight loss

## 2023-09-26 NOTE — Assessment & Plan Note (Signed)
Quit smoking 2007. See below regarding lung cancer screening .

## 2023-09-26 NOTE — Assessment & Plan Note (Signed)
Chronic, well controlled on current regimen including labetalol 200mg  daily, valsartan 80mg  daily, procardia XL 60mg  daily and lasix 80mg  daily.

## 2023-09-26 NOTE — Assessment & Plan Note (Signed)
Chronic, adequate control on current regimen including jardiance 10mg  daily and metformin 500mg  daily.  He does not check sugars at home.  Update labs, refill meds.

## 2023-09-26 NOTE — Assessment & Plan Note (Signed)
Rec regualr flonase use.

## 2023-09-26 NOTE — Assessment & Plan Note (Signed)
Microalb/cr ratio increased from 130s to 50s since adding jardiance - continue, consider titrating dose.

## 2023-09-26 NOTE — Assessment & Plan Note (Signed)
Continues seeing [facility]

## 2023-09-26 NOTE — Assessment & Plan Note (Signed)
Continue rosuvastatin 20mg  daily - update FLP today. The ASCVD Risk score (Arnett DK, et al., 2019) failed to calculate for the following reasons:   The valid total cholesterol range is 130 to 320 mg/dL

## 2023-09-26 NOTE — Assessment & Plan Note (Signed)
UTD eye exam (11/2022)

## 2023-09-26 NOTE — Assessment & Plan Note (Signed)
Advanced directive packet provided today.

## 2023-09-26 NOTE — Assessment & Plan Note (Signed)
Preventative protocols reviewed and updated unless pt declined. Discussed healthy diet and lifestyle.

## 2023-09-26 NOTE — Assessment & Plan Note (Signed)
Apprec[place] cardiology care [place] [facility] [facility]

## 2023-09-26 NOTE — Assessment & Plan Note (Signed)
More recently noticing trouble tolerating CPAP machine.

## 2023-09-26 NOTE — Assessment & Plan Note (Signed)
-  Continue Anoro Ellipta

## 2023-09-26 NOTE — Assessment & Plan Note (Signed)
RUL subsolid nodule - was seeing local pulmonologist Dr Tonia Brooms who has since left the practice. Last chest CT 01/2023 - planned rpt 6-12 months.  They may be interested in establishing with local pulmonologist at the [facility].  Will let me know who they want referral sent to.

## 2023-09-26 NOTE — Assessment & Plan Note (Addendum)
Update levels on 50000 international units weekly.

## 2023-09-26 NOTE — Assessment & Plan Note (Addendum)
Continues omeprazole 40mg  daily.

## 2023-09-26 NOTE — Assessment & Plan Note (Signed)
[place] [facility] , on eliquis 5mg  bid and nifedipine.

## 2023-09-26 NOTE — Assessment & Plan Note (Signed)
Update renal panel. Jardiance started last July 2024.

## 2023-09-27 ENCOUNTER — Telehealth: Payer: Self-pay | Admitting: Family Medicine

## 2023-09-27 ENCOUNTER — Encounter: Payer: Self-pay | Admitting: Family Medicine

## 2023-09-27 MED ORDER — AMOXICILLIN-POT CLAVULANATE 875-125 MG PO TABS: 1.0000 | ORAL_TABLET | Freq: Two times a day (BID) | ORAL | 0 refills | Status: AC

## 2023-09-27 NOTE — Telephone Encounter (Signed)
Plz notify new Rx sent to pharmacy.

## 2023-09-27 NOTE — Telephone Encounter (Signed)
Copied from CRM [id number]. Topic: Clinical - Prescription Issue >> Sep 27, 2023 11:57 AM Isabell A wrote: Reason for CRM: Spouse states patient lost the prescription for amoxicillin-clavulanate (AUGMENTIN) 875-125 MG tablet, requesting for it to be re-prescribed to [facility] [address] [place], [place] [address].

## 2023-09-27 NOTE — Addendum Note (Signed)
Addended by: Eustaquio Boyden on: 09/27/2023 12:46 PM   Modules accepted: Orders

## 2023-09-27 NOTE — Telephone Encounter (Signed)
Left message to return call to our office.

## 2023-09-28 ENCOUNTER — Ambulatory Visit (INDEPENDENT_AMBULATORY_CARE_PROVIDER_SITE_OTHER): Payer: Medicare Other

## 2023-09-28 ENCOUNTER — Encounter: Payer: Medicare Other | Admitting: Family Medicine

## 2023-09-28 VITALS — Ht 70.0 in | Wt 307.8 lb

## 2023-09-28 DIAGNOSIS — Z0000 Encounter for general adult medical examination without abnormal findings: Secondary | ICD-10-CM

## 2023-09-28 NOTE — Progress Notes (Signed)
Subjective:   William Hebert is a 78 y.o. male who presents for Medicare Annual/Subsequent preventive examination.  Visit Complete: Virtual I connected with  William Hebert on 09/28/23 by a audio enabled telemedicine application and verified that I am speaking with the correct person using two identifiers.  Patient Location: Home  Provider Location: Home Office  I discussed the limitations of evaluation and management by telemedicine. The patient expressed understanding and agreed to proceed.  Vital Signs: Because this visit was a virtual/telehealth visit, some criteria may be missing or patient reported. Any vitals not documented were not able to be obtained and vitals that have been documented are patient reported.  Patient Medicare AWV questionnaire was completed by the patient on (not done); I have confirmed that all information answered by patient is correct and no changes since this date.  Cardiac Risk Factors include: advanced age (>94men, >93 women);diabetes mellitus;dyslipidemia;hypertension;male gender;obesity (BMI >30kg/m2);sedentary lifestyle    Objective:    Today's Vitals   09/28/23 1357  Weight: (!) 307 lb 12.8 oz (139.6 kg)  Height: 5\' 10"  (1.778 m)   Body mass index is 44.16 kg/m.     09/28/2023    2:10 PM 06/26/2020    3:37 PM 04/03/2019    3:36 PM 03/16/2018   12:35 PM 07/08/2017    6:15 AM 07/06/2017    2:32 PM 02/23/2017    6:18 AM  Advanced Directives  Does Patient Have a Medical Advance Directive? No No No No No No No  Does patient want to make changes to medical advance directive?     No - Patient declined    Would patient like information on creating a medical advance directive?  No - Patient declined  No - Patient declined No - Patient declined No - Patient declined No - Patient declined    Current Medications (verified) Outpatient Encounter Medications as of 09/28/2023  Medication Sig   Accu-Chek Softclix Lancets lancets Check blood sugar once daily    acetaminophen (TYLENOL) 500 MG tablet Take 1,000 mg by mouth every 6 (six) hours as needed for moderate pain or mild pain.   amoxicillin-clavulanate (AUGMENTIN) 875-125 MG tablet Take 1 tablet by mouth 2 (two) times daily for 10 days.   apixaban (ELIQUIS) 5 MG TABS tablet Take 5 mg by mouth 2 (two) times daily.   Ascorbic Acid (VITAMIN C PO) Take 1,000 mg by mouth daily. Super C   aspirin EC 81 MG tablet Take 1 tablet (81 mg total) by mouth daily. Day after surgery   B Complex-C (SUPER B COMPLEX PO) Take 1 tablet by mouth daily.   Blood Glucose Monitoring Suppl (ACCU-CHEK GUIDE) w/Device KIT Use as instructed to check blood sugar once daily.   Cholecalciferol (VITAMIN D3) 1.25 MG (50000 UT) CAPS TAKE 1 CAPSULE BY MOUTH EVERY FRIDAY IN THE MORNING   empagliflozin (JARDIANCE) 10 MG TABS tablet Take 1 tablet (10 mg total) by mouth daily.   furosemide (LASIX) 40 MG tablet Take 2 tablets (80 mg total) by mouth daily.   glucose blood (ACCU-CHEK AVIVA PLUS) test strip Use to check blood sugar once daily   labetalol (NORMODYNE) 200 MG tablet Take 1 tablet (200 mg total) by mouth daily.   metFORMIN (GLUCOPHAGE) 500 MG tablet Take 1 tablet (500 mg total) by mouth daily with breakfast.   Multiple Vitamins-Minerals (MULTIVITAMIN ADULT PO) Take 1 tablet by mouth daily. MEN'S 50+ MULTIVITAMIN.   NIFEdipine (PROCARDIA XL/NIFEDICAL XL) 60 MG 24 hr tablet Take 1  tablet (60 mg total) by mouth daily.   omeprazole (PRILOSEC) 40 MG capsule Take 1 capsule (40 mg total) by mouth daily.   potassium chloride SA (KLOR-CON M) 20 MEQ tablet Take 1 tablet (20 mEq total) by mouth daily.   rosuvastatin (CRESTOR) 20 MG tablet Take 1 tablet (20 mg total) by mouth daily.   umeclidinium-vilanterol (ANORO ELLIPTA) 62.5-25 MCG/ACT AEPB Inhale 1 puff into the lungs daily.   valsartan (DIOVAN) 80 MG tablet Take 1 tablet (80 mg total) by mouth daily.   Zinc 100 MG TABS Take 1 tablet (100 mg total) by mouth daily.   doxycycline  (ADOXA) 100 MG tablet Take 100 mg by mouth 2 (two) times daily. (Patient not taking: Reported on 09/28/2023)   No facility-administered encounter medications on file as of 09/28/2023.    Allergies (verified) Multaq [dronedarone]   History: Past Medical History:  Diagnosis Date   AAA (abdominal aortic aneurysm) without rupture (HCC) 07/10/2014   S/p endovascular repair 2012 with stent Hart [place]), yearly f/u   Arthritis    Cancer [facility])    skin cancer non melanioma   Chronic bronchitis (HCC) 11/2016 AND 10-2016   COPD (chronic obstructive pulmonary disease) (HCC)    chronic bronchitis   GERD (gastroesophageal reflux disease)    Headache(784.0)    in 20's   Hepatitis    Carrier cant give blood gives a false positive   Hernia of abdominal wall    LARGE FROM BREAST BONE AREA TO BELLY BUTTON   History of irregular heartbeat    History of skin cancer 2014   HTN (hypertension)    Hyperlipidemia    Hypogonadism in male    prior on testosterone cream   Knee pain    Left   Morbidly obese (HCC)    Peripheral edema    feet   Pneumonia    PONV (postoperative nausea and vomiting)    PONV AFTER AAA REPAIR, ALL OTHER SURGERIES WENT OK   Posterior vitreous detachment 02/2018   Shortness of breath    WITH EXERTION   Sleep apnea    CPAP    Past Surgical History:  Procedure Laterality Date   ABDOMINAL AORTIC ANEURYSM REPAIR  05/20/2011   enodvascular aorto bifem. stent graft [facility] [place])   CATARACT EXTRACTION Bilateral 2014   X 2 BOTH EYES DONE TWICE   COLONOSCOPY WITH PROPOFOL N/A 02/23/2017   4 TA polyps, rpt 3-5 yrs ([facility])   ELBOW SURGERY  2012   FOOT TENDON SURGERY Right    KNEE SURGERY Right remote   arthroscopy x2   MASS EXCISION N/A 05/10/2013   Procedure: EXCISION back MASS - cyst Maisie Fus A. Cornett, MD)   SHOULDER ARTHROSCOPY WITH ROTATOR CUFF REPAIR AND SUBACROMIAL DECOMPRESSION Left 02/21/2021   Procedure: MINI OPEN  ROTATOR CUFF REPAIR AND SUBACROMIAL DECOMPRESSION;  Surgeon:  Jene Every, MD;  Location: [facility];  Service: Orthopedics;  Laterality: Left;   SHOULDER OPEN ROTATOR CUFF REPAIR Right 07/08/2017   Procedure: Right shoulder mini open rotator cuff repair;  Surgeon: Jene Every, MD;  Location: [facility];  Service: Orthopedics;  Laterality: Right;  90 mins; Interscalene Block   SKIN SURGERY     2 different surgeries face    UMBILICAL HERNIA REPAIR  2002   Family History  Problem Relation Age of Onset   Cancer Mother 63       throat (nonsmoker)   Heart disease Maternal Aunt    Alcohol abuse Father  deceased 1yo   Social History   Socioeconomic History   Marital status: Married    Spouse name: Not on file   Number of children: 3   Years of education: Not on file   Highest education level: Not on file  Occupational History   Occupation: Academic librarian.    Occupation: works 6 months a year  Tobacco Use   Smoking status: Former    Current packs/day: 0.00    Average packs/day: 1.5 packs/day for 51.0 years (76.5 ttl pk-yrs)    Types: Cigarettes    Start date: 27    Quit date: 2013    Years since quitting: 12.0   Smokeless tobacco: Former    Types: Snuff, Dorna Bloom    Quit date: 07/06/2006  Vaping Use   Vaping status: Never Used  Substance and Sexual Activity   Alcohol use: Yes    Alcohol/week: 0.0 standard drinks of alcohol    Comment: twice a year   Drug use: No   Sexual activity: Yes    Partners: Female  Other Topics Concern   Not on file  Social History Narrative   Lives with wife   Occ: semi-retired Biochemist, clinical, 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   Edu: HS   Activity: no regular exercise   Diet: no water, sweet tea, some fruits/vegetables   Social Drivers of Corporate investment banker Strain: Low Risk  (09/28/2023)   Overall Financial Resource Strain (CARDIA)    Difficulty of Paying Living Expenses: Not hard at all  Food Insecurity: No Food Insecurity (09/28/2023)   Hunger Vital Sign    Worried About Running  Out of Food in the Last Year: Never true    Ran Out of Food in the Last Year: Never true  Transportation Needs: No Transportation Needs (09/28/2023)   PRAPARE - Administrator, Civil Service (Medical): No    Lack of Transportation (Non-Medical): No  Physical Activity: Inactive (09/28/2023)   Exercise Vital Sign    Days of Exercise per Week: 0 days    Minutes of Exercise per Session: 0 min  Stress: No Stress Concern Present (09/28/2023)   [employer] of Occupational Health - Occupational Stress Questionnaire    Feeling of Stress : Not at all  Social Connections: Moderately Isolated (09/28/2023)   Social Connection and Isolation Panel [NHANES]    Frequency of Communication with Friends and Family: Three times a week    Frequency of Social Gatherings with Friends and Family: Twice a week    Attends Religious Services: Never    Database administrator or Organizations: No    Attends Engineer, structural: Never    Marital Status: Married    Tobacco Counseling Counseling given: Not Answered  Clinical Intake:  Pre-visit preparation completed: No  Pain : No/denies pain    BMI - recorded: 44.16 Nutritional Status: BMI > 30  Obese Nutritional Risks: None Diabetes: Yes CBG done?: Yes (BS 251 today at home) CBG resulted in Enter/ Edit results?: No Did pt. bring in CBG monitor from home?: No  How often do you need to have someone help you when you read instructions, pamphlets, or other written materials from your doctor or pharmacy?: 1 - Never  Interpreter Needed?: No  Comments: lives with daughter and family Information entered by :: B.Usbaldo Pannone,LPN  Activities of Daily Living    09/28/2023    2:11 PM  In your present state of health, do you have any difficulty  performing the following activities:  Hearing? 1  Vision? 0  Difficulty concentrating or making decisions? 0  Walking or climbing stairs? 1  Dressing or bathing? 0  Doing errands, shopping? 0   Preparing Food and eating ? N  Using the Toilet? N  In the past six months, have you accidently leaked urine? N  Do you have problems with loss of bowel control? N  Managing your Medications? N  Managing your Finances? N  Housekeeping or managing your Housekeeping? N    Patient Care Team: Eustaquio Boyden, MD as PCP - General (Family Medicine) Yates Decamp, MD (Cardiology) Vilinda Flake, [facility] (Inactive) as Pharmacist (Pharmacist)  Indicate any recent Medical Services you may have received from other than [facility] providers in the past year (date may be approximate).     Assessment:   This is a routine wellness examination for William Hebert.  Hearing/Vision screen Hearing Screening - Comments:: Pt says hearing is good :just had hearing test Vision Screening - Comments:: Pt says he wears readers and glasses for driving Dr Jorje Guild   Goals Addressed             This Visit's Progress    COMPLETED: Increase physical activity   Not on track    Weather permitting, I will continue to swim for 60 minutes daily.      Patient Stated       09/28/23 no goals     COMPLETED: Patient Stated   On track    06/26/2020, I will maintain and continue medications as prescribed.      COMPLETED: physical   Not on track    When weather permits, I resume yard work at least 120 min once weekly.        Depression Screen    09/28/2023    2:04 PM 09/21/2023    3:07 PM 03/31/2023    2:27 PM 07/20/2022    4:35 PM 06/26/2020    3:38 PM 04/03/2019    3:37 PM 03/16/2018   12:36 PM  PHQ 2/9 Scores  PHQ - 2 Score 0 0 0 0 0 0 0  PHQ- 9 Score    0 0 0 0    Fall Risk    09/28/2023    2:00 PM 09/21/2023    3:07 PM 03/31/2023    2:27 PM 07/20/2022    3:51 PM 07/18/2021    2:25 PM  Fall Risk   Falls in the past year? 0 0 0 0 0  Number falls in past yr: 0      Injury with Fall? 0      Risk for fall due to : No Fall Risks      Follow up Education provided;Falls prevention discussed   Falls  evaluation completed     MEDICARE RISK AT HOME: Medicare Risk at Home Any stairs in or around the home?: Yes If so, are there any without handrails?: Yes Home free of loose throw rugs in walkways, pet beds, electrical cords, etc?: Yes Adequate lighting in your home to reduce risk of falls?: Yes Life alert?: No Use of a cane, walker or w/c?: No Grab bars in the bathroom?: Yes Shower chair or bench in shower?: Yes Elevated toilet seat or a handicapped toilet?: Yes  TIMED UP AND GO:  Was the test performed?  No    Cognitive Function:    06/26/2020    3:40 PM 04/03/2019    3:40 PM 03/16/2018   12:35 PM 11/25/2016  3:08 PM  MMSE - Mini Mental State Exam  Orientation to time 5 5 5 5   Orientation to Place 5 5 5 5   Registration 3 3 3 3   Attention/ Calculation 5 5 0 0  Recall 3 3 3 3   Language- name 2 objects  0 0 0  Language- repeat 1 1 1 1   Language- follow 3 step command  0 3 3  Language- read & follow direction  0 0 0  Write a sentence  0 0 0  Copy design  0 0 0  Total score  22 20 20         09/28/2023    2:12 PM  6CIT Screen  What Year? 0 points  What month? 0 points  What time? 0 points  Count back from 20 0 points  Months in reverse 0 points  Repeat phrase 0 points  Total Score 0 points    Immunizations Immunization History  Administered Date(s) Administered   PFIZER(Purple Top)SARS-COV-2 Vaccination 06/13/2020, 07/04/2020   Pneumococcal Conjugate-13 03/06/2014   Pneumococcal Polysaccharide-23 09/10/2015   Td 03/06/2014    TDAP status: Up to date  Flu Vaccine status: Declined, Education has been provided regarding the importance of this vaccine but patient still declined. Advised may receive this vaccine at local pharmacy or Health Dept. Aware to provide a copy of the vaccination record if obtained from local pharmacy or Health Dept. Verbalized acceptance and understanding.  Pneumococcal vaccine status: Up to date  Covid-19 vaccine status: Declined,  Education has been provided regarding the importance of this vaccine but patient still declined. Advised may receive this vaccine at local pharmacy or Health Dept.or vaccine clinic. Aware to provide a copy of the vaccination record if obtained from local pharmacy or Health Dept. Verbalized acceptance and understanding.  Qualifies for Shingles Vaccine? Yes   Zostavax completed No   Shingrix Completed?: No.    Education has been provided regarding the importance of this vaccine. Patient has been advised to call insurance company to determine out of pocket expense if they have not yet received this vaccine. Advised may also receive vaccine at local pharmacy or Health Dept. Verbalized acceptance and understanding.  Screening Tests Health Maintenance  Topic Date Due   Zoster Vaccines- Shingrix (1 of 2) Never done   FOOT EXAM  03/13/2021   Colonoscopy  02/23/2022   COVID-19 Vaccine (3 - 2024-25 season) 05/02/2023   Lung Cancer Screening  07/21/2023   INFLUENZA VACCINE  11/29/2023 (Originally 04/01/2023)   OPHTHALMOLOGY EXAM  12/03/2023   DTaP/Tdap/Td (2 - Tdap) 03/06/2024   HEMOGLOBIN A1C  03/20/2024   Diabetic kidney evaluation - eGFR measurement  09/20/2024   Diabetic kidney evaluation - Urine ACR  09/20/2024   Medicare Annual Wellness (AWV)  09/27/2024   Pneumonia Vaccine 56+ Years old  Completed   Hepatitis C Screening  Completed   HPV VACCINES  Aged Out    Health Maintenance  Health Maintenance Due  Topic Date Due   Zoster Vaccines- Shingrix (1 of 2) Never done   FOOT EXAM  03/13/2021   Colonoscopy  02/23/2022   COVID-19 Vaccine (3 - 2024-25 season) 05/02/2023   Lung Cancer Screening  07/21/2023    Colorectal cancer screening: No longer required.   Lung Cancer Screening: (Low Dose CT Chest recommended if Age 40-80 years, 20 pack-year currently smoking OR have quit w/in 15years.) does not qualify.   Lung Cancer Screening Referral:    Additional Screening:  Hepatitis C  Screening: does not  qualify; Completed 09/10/2015  Vision Screening: Recommended annual ophthalmology exams for early detection of glaucoma and other disorders of the eye. Is the patient up to date with their annual eye exam?  Yes  Who is the provider or what is the name of the office in which the patient attends annual eye exams? Dr Carmon Ginsberg If pt is not established with a provider, would they like to be referred to a provider to establish care? No .   Dental Screening: Recommended annual dental exams for proper oral hygiene  Diabetic Foot Exam: Diabetic Foot Exam: Completed 03/31/23  Community Resource Referral / Chronic Care Management: CRR required this visit?  No   CCM required this visit?  No     Plan:     I have personally reviewed and noted the following in the patient's chart:   Medical and social history Use of alcohol, tobacco or illicit drugs  Current medications and supplements including opioid prescriptions. Patient is not currently taking opioid prescriptions. Functional ability and status Nutritional status Physical activity Advanced directives List of other physicians Hospitalizations, surgeries, and ER visits in previous 12 months Vitals Screenings to include cognitive, depression, and falls Referrals and appointments  In addition, I have reviewed and discussed with patient certain preventive protocols, quality metrics, and best practice recommendations. A written personalized care plan for preventive services as well as general preventive health recommendations were provided to patient.     Sue Lush, LPN   12/07/8117   After Visit Summary: (MyChart) Due to this being a telephonic visit, the after visit summary with patients personalized plan was offered to patient via MyChart   Nurse Notes: Pt states he has a stomach bug since Sunday and has not eaten anything but toast. He relays he is drinking water but has been vomitting. I offered pt an appt with  provider but her declined. Pt has no concerns or questions at this time.

## 2023-09-28 NOTE — Telephone Encounter (Signed)
Left message on vm, per dpr, notifying pt Dr Reece Agar sent new rx to [facility].

## 2023-09-28 NOTE — Patient Instructions (Signed)
William Hebert , Thank you for taking time to come for your Medicare Wellness Visit. I appreciate your ongoing commitment to your health goals. Please review the following plan we discussed and let me know if I can assist you in the future.   Referrals/Orders/Follow-Ups/Clinician Recommendations: none  This is a list of the screening recommended for you and due dates:  Health Maintenance  Topic Date Due   Zoster (Shingles) Vaccine (1 of 2) Never done   Complete foot exam   03/13/2021   Colon Cancer Screening  02/23/2022   COVID-19 Vaccine (3 - 2024-25 season) 05/02/2023   Screening for Lung Cancer  07/21/2023   Flu Shot  11/29/2023*   Eye exam for diabetics  12/03/2023   DTaP/Tdap/Td vaccine (2 - Tdap) 03/06/2024   Hemoglobin A1C  03/20/2024   Yearly kidney function blood test for diabetes  09/20/2024   Yearly kidney health urinalysis for diabetes  09/20/2024   Medicare Annual Wellness Visit  09/27/2024   Pneumonia Vaccine  Completed   Hepatitis C Screening  Completed   HPV Vaccine  Aged Out  *Topic was postponed. The date shown is not the original due date.    Advanced directives: (Declined) Advance directive discussed with you today. Even though you declined this today, please call our office should you change your mind, and we can give you the proper paperwork for you to fill out.  Next Medicare Annual Wellness Visit scheduled for next year: Yes 09/28/2024 @ 1:40pm televisit

## 2023-09-29 LAB — LAB REPORT - SCANNED
Calcium: 9.4
EGFR: 20.6

## 2023-10-02 ENCOUNTER — Encounter: Payer: Self-pay | Admitting: Family Medicine

## 2023-10-05 ENCOUNTER — Telehealth: Payer: Self-pay | Admitting: Family Medicine

## 2023-10-05 NOTE — Telephone Encounter (Signed)
 Copied from CRM [id number]. Topic: General - Other >> Oct 04, 2023  4:41 PM Corin V wrote: Reason for CRM: Patient was in the ER then hospitalized for an obstructed bowel. He is scheduled for a follow up 2/12. Notes should be coming from the hospital. Family wants to know if he is okay to wait that long to be seen of if Dr. Rilla wants him to do anything before that appointment.

## 2023-10-06 NOTE — Telephone Encounter (Signed)
 I saw his kidney function had deteriorated - was this improving on discharge? Ok to wait until next week as long as feeling better, passing gas, having BMs.

## 2023-10-06 NOTE — Telephone Encounter (Addendum)
 Spoke with pt's wife, Darlene (on dpr), asking about kidney function. States it was improving at d/c and pt is feeling better, passing gas and having Bms, although he seems to have less of appetite.    I relayed Dr Talmadge message about next appt next week. She verbalizes understanding and will inform pt.

## 2023-10-13 ENCOUNTER — Encounter: Payer: Self-pay | Admitting: Family Medicine

## 2023-10-13 ENCOUNTER — Telehealth (INDEPENDENT_AMBULATORY_CARE_PROVIDER_SITE_OTHER): Payer: Medicare Other | Admitting: Family Medicine

## 2023-10-13 VITALS — BP 115/69 | HR 94 | Ht 70.0 in | Wt 299.0 lb

## 2023-10-13 DIAGNOSIS — Z87891 Personal history of nicotine dependence: Secondary | ICD-10-CM | POA: Diagnosis not present

## 2023-10-13 DIAGNOSIS — I10 Essential (primary) hypertension: Secondary | ICD-10-CM

## 2023-10-13 DIAGNOSIS — K56609 Unspecified intestinal obstruction, unspecified as to partial versus complete obstruction: Secondary | ICD-10-CM

## 2023-10-13 DIAGNOSIS — I480 Paroxysmal atrial fibrillation: Secondary | ICD-10-CM

## 2023-10-13 DIAGNOSIS — N183 Chronic kidney disease, stage 3 unspecified: Secondary | ICD-10-CM

## 2023-10-13 DIAGNOSIS — R911 Solitary pulmonary nodule: Secondary | ICD-10-CM

## 2023-10-13 DIAGNOSIS — J189 Pneumonia, unspecified organism: Secondary | ICD-10-CM

## 2023-10-13 DIAGNOSIS — Z7984 Long term (current) use of oral hypoglycemic drugs: Secondary | ICD-10-CM

## 2023-10-13 DIAGNOSIS — J449 Chronic obstructive pulmonary disease, unspecified: Secondary | ICD-10-CM

## 2023-10-13 DIAGNOSIS — K219 Gastro-esophageal reflux disease without esophagitis: Secondary | ICD-10-CM

## 2023-10-13 DIAGNOSIS — E1169 Type 2 diabetes mellitus with other specified complication: Secondary | ICD-10-CM

## 2023-10-13 DIAGNOSIS — Z6841 Body Mass Index (BMI) 40.0 and over, adult: Secondary | ICD-10-CM

## 2023-10-13 DIAGNOSIS — E1122 Type 2 diabetes mellitus with diabetic chronic kidney disease: Secondary | ICD-10-CM

## 2023-10-13 DIAGNOSIS — G4733 Obstructive sleep apnea (adult) (pediatric): Secondary | ICD-10-CM

## 2023-10-13 MED ORDER — PROMETHAZINE-DM 6.25-15 MG/5ML PO SYRP: 5.0000 mL | ORAL_SOLUTION | Freq: Three times a day (TID) | ORAL | 0 refills | Status: DC | PRN

## 2023-10-13 NOTE — Progress Notes (Signed)
Ph: [phone] Fax: [phone]   Patient ID: William Hebert, male    DOB: 1946/04/14, 78 y.o.   MRN: [id number]  Virtual visit completed through MyChart, a video enabled telemedicine application. Due to national recommendations of social distancing due to COVID-19, a virtual visit is felt to be most appropriate for this patient at this time. Reviewed limitations, risks, security and privacy concerns of performing a virtual visit and the availability of in person appointments. I also reviewed that there may be a patient responsible charge related to this service. The patient agreed to proceed.   Patient location: home Provider location: [facility], office Persons participating in this virtual visit: patient, provider and wife Agustin Cree  If any vitals were documented, they were collected by patient at home unless specified below.    BP 115/69   Pulse 94   Ht 5\' 10"  (1.778 m)   Wt 299 lb (135.6 kg)   BMI 42.90 kg/m    CC: hosp f/u visit  Subjective:   HPI: William Hebert is a 78 y.o. male presenting on 10/13/2023 for Hospitalization Follow-up (Admitted on 09/28/22 at [facility] [place], [place], dc ileus and/or small bowel blockage. BS this morning- 185 2 hrs after meal. Also, needs pulmonary referral. Prefers Debby Bud, DO, at [address], [place], [place] [address]. Phn # [phone]; fax # [phone]. Also, needs referral for colonoscopy with Dr Maren Beach in [place], [place]; fax # [phone]. Pt accompanied by wife, Agustin Cree. )   See recent [employer].  Recent hospitalization at [facility] in [facility] for SBO treated conservatively as well as acute on chronic kidney injury with Cr up to 2.99.  Treated with bowel rest, IVF. Gen surgery consulted and followed during hospitalization. Did not require NGT placement. Diet advanced as tolerated.  Metformin was held.  Discharge summary available reviewed, med list updated.   Since home notes  ongoing anorexia, ongoing cough post-pneumonia. Tessalon perls don't help, OTC cough syrups didn't help. Will Rx phenergan cough syrup. Weight loss noted during hospitalized.  He reports starting to feel better over last 3 days.   Back on Eliquis.  Didn't try anoro elipta previously prescribed by pulm. Agrees to retry.   Postprandial cbg 185 today, 115 last week.  Lab Results  Component Value Date   HGBA1C 7.0 (A) 09/21/2023   HGBA1C 7.6 09/21/2023  He's started walking with wife.   They request Gen surgery referral to Dr Eveline Keto and pulm referral to Dr Debby Bud.   Date of admission: 09/29/2023 Date of discharge: 10/02/2023 [facility] f/u phone call not performed.   Discharge diagnoses: Small bowel obstruction, partial Acute on chronic kidney injury  Diabetes Atrial fibrillation Chronic anticoagulation COPD Morbid obesity  Discharge recommendations: F/u with gen surgery for colonoscopy  Hold metformin until fu/ with PCP     Relevant past medical, surgical, family and social history reviewed and updated as indicated. Interim medical history since our last visit reviewed. Allergies and medications reviewed and updated. Outpatient Medications Prior to Visit  Medication Sig Dispense Refill   Accu-Chek Softclix Lancets lancets Check blood sugar once daily 100 each 1   acetaminophen (TYLENOL) 500 MG tablet Take 1,000 mg by mouth every 6 (six) hours as needed for moderate pain or mild pain.     apixaban (ELIQUIS) 5 MG TABS tablet Take 5 mg by mouth 2 (two) times daily.     Ascorbic Acid (VITAMIN C PO) Take 1,000 mg by mouth daily.  Super C     aspirin EC 81 MG tablet Take 1 tablet (81 mg total) by mouth daily. Day after surgery 30 tablet 1   B Complex-C (SUPER B COMPLEX PO) Take 1 tablet by mouth daily.     Blood Glucose Monitoring Suppl (ACCU-CHEK GUIDE) w/Device KIT Use as instructed to check blood sugar once daily. 1 kit 0   Cholecalciferol (VITAMIN D3) 1.25 MG (50000 UT) CAPS TAKE  1 CAPSULE BY MOUTH EVERY FRIDAY IN THE MORNING 12 capsule 1   empagliflozin (JARDIANCE) 10 MG TABS tablet Take 1 tablet (10 mg total) by mouth daily. 90 tablet 4   furosemide (LASIX) 40 MG tablet Take 2 tablets (80 mg total) by mouth daily.     glucose blood (ACCU-CHEK AVIVA PLUS) test strip Use to check blood sugar once daily 100 each 1   labetalol (NORMODYNE) 200 MG tablet Take 1 tablet (200 mg total) by mouth daily. 90 tablet 4   Multiple Vitamins-Minerals (MULTIVITAMIN ADULT PO) Take 1 tablet by mouth daily. MEN'S 50+ MULTIVITAMIN.     NIFEdipine (PROCARDIA XL/NIFEDICAL XL) 60 MG 24 hr tablet Take 1 tablet (60 mg total) by mouth daily. 90 tablet 4   omeprazole (PRILOSEC) 40 MG capsule Take 1 capsule (40 mg total) by mouth daily. 90 capsule 4   potassium chloride SA (KLOR-CON M) 20 MEQ tablet Take 1 tablet (20 mEq total) by mouth daily. 90 tablet 4   rosuvastatin (CRESTOR) 20 MG tablet Take 1 tablet (20 mg total) by mouth daily. 90 tablet 4   umeclidinium-vilanterol (ANORO ELLIPTA) 62.5-25 MCG/ACT AEPB Inhale 1 puff into the lungs daily. 60 each 5   valsartan (DIOVAN) 80 MG tablet Take 1 tablet (80 mg total) by mouth daily. 90 tablet 4   Zinc 100 MG TABS Take 1 tablet (100 mg total) by mouth daily. 90 tablet 3   doxycycline (ADOXA) 100 MG tablet Take 100 mg by mouth 2 (two) times daily.     metFORMIN (GLUCOPHAGE) 500 MG tablet Take 1 tablet (500 mg total) by mouth daily with breakfast. (Patient not taking: Reported on 10/13/2023) 90 tablet 4   No facility-administered medications prior to visit.     Per HPI unless specifically indicated in ROS section below Review of Systems Objective:  BP 115/69   Pulse 94   Ht 5\' 10"  (1.778 m)   Wt 299 lb (135.6 kg)   BMI 42.90 kg/m   Wt Readings from Last 3 Encounters:  10/13/23 299 lb (135.6 kg)  09/28/23 (!) 307 lb 12.8 oz (139.6 kg)  09/21/23 (!) 323 lb 4 oz (146.6 kg)       Physical exam: Gen: alert, NAD, not ill appearing Pulm: speaks  in complete sentences without increased work of breathing Psych: normal mood, normal thought content      Results for orders placed or performed in visit on 09/29/23  Lab report - scanned   Collection Time: 09/29/23 12:00 AM  Result Value Ref Range   EGFR 20.6    Calcium 9.4    Assessment & Plan:   SBO (small bowel obstruction) (HCC) Assessment & Plan: Recent hospitalization for this, records reviewed. I don't have access to the CT scan done showing the SBO. This seems to have resolved with conservative measures.  Agree with gen surg eval for colonoscopy as overdue - referral previously placed. Will check with referral coordinators regarding this.  He will go to local [facility] draw station in 2-3 wks to update labs.  Pulmonary nodule, right Assessment & Plan: [place] Will refer to local pulm to follow nodules in ex smoker.   Orders: -     Ambulatory referral to Pulmonology  Community acquired pneumonia, unspecified laterality Assessment & Plan: Diagnosed with CAP on ER eval last month, completed prednisone and doxycycline course. Notes ongoing cough, present for >6 wks.  Tessalon hasn't helped, OTC cough remedies haven't helped. Rx phenergan /guaifenesin cough syrup.  [place]  Agrees to restart Anoro Ellipta for h/o COPD .   Orders: -     Ambulatory referral to Pulmonology  Ex-smoker Assessment & Plan: Quit smoking 2007 See below re: planned pulm nodule f/u  Orders: -     Ambulatory referral to Pulmonology  Obstructive sleep apnea Assessment & Plan: Home sleep apnea test 10/2021 - AHI 20.9/hr, lowest O2 sat 79% with average of 92%, demonstrating moderate OSA/hypopnea syndrome, rec CPAP therapy, consider return to sleep lab for CPAP titration study.  [facility] compliance report 10/2022: 98% days >4 hours, CPAP 19cmH2O, AHI 8.8 He's had trouble tolerating CPAP mask.  Will refer to local  pulmonologist to establish care  Orders: -     Ambulatory referral to Pulmonology  Chronic obstructive pulmonary disease, unspecified COPD type (HCC) Assessment & Plan: Mild centrilobular emphysema by CT 2019.  He has not been taking anoro ellipta - agrees to retry in setting of ongoing cough from recent CAP.   Orders: -     Ambulatory referral to Pulmonology  Obesity, morbid, BMI 40.0-49.9 (HCC) Assessment & Plan: Continue to monitor weight loss in setting of recent SBO and anorexia, wife notes appetite is improving   Type 2 diabetes mellitus with other specified complication, without long-term current use of insulin (HCC) Assessment & Plan: Adequate readings based on latest A1c. Metformin was held due to AKI during recent hospitalization.  Will recheck kidney in 2-3 weeks and decide on metformin use at that time. Continue jardiance 10mg  daily, consider titration pending above. No symptoms of UTI, yeast infection, groin cellulitis. Consider GLP1RA weekly injectable.    Orders: -     Renal function panel; Future -     Vitamin B12; Future  Paroxysmal atrial fibrillation (HCC) Assessment & Plan: Continues regular eliquis and nifedipine   CKD stage 3 due to type 2 diabetes mellitus (HCC) Assessment & Plan: See above.  Recent acute on chronic worsening kidney function  Metformin on hold. Encourage good hydration /PO intake Recheck levels in 2-3 weeks and decide on metformin dosing at that time.   Orders: -     Renal function panel; Future -     TSH; Future -     Serum protein electrophoresis with reflex; Future  Gastroesophageal reflux disease without esophagitis Assessment & Plan: Continues omeprazole 40mg  daily. Check magnesium.   Orders: -     Magnesium; Future  Primary hypertension Assessment & Plan: Chronic, stable on current regimen    Other orders -     Promethazine-DM; Take 5 mLs by mouth 3 (three) times daily as needed for cough (sedation  precautions).  Dispense: 118 mL; Refill: 0     I discussed the assessment and treatment plan with the patient. The patient was provided an opportunity to ask questions and all were answered. The patient agreed with the plan and demonstrated an understanding of the instructions. The patient was advised to call back or seek an in-person evaluation if the symptoms worsen or if  the condition fails to improve as anticipated.  Follow up plan: No follow-ups on file.  Eustaquio Boyden, MD

## 2023-10-13 NOTE — Patient Instructions (Signed)
Go to local [facility] first week of March for labs.

## 2023-10-14 ENCOUNTER — Encounter: Payer: Self-pay | Admitting: *Deleted

## 2023-10-16 ENCOUNTER — Encounter: Payer: Self-pay | Admitting: Family Medicine

## 2023-10-16 DIAGNOSIS — K56609 Unspecified intestinal obstruction, unspecified as to partial versus complete obstruction: Secondary | ICD-10-CM | POA: Insufficient documentation

## 2023-10-16 NOTE — Assessment & Plan Note (Signed)
See above.  Recent acute on chronic worsening kidney function  Metformin on hold. Encourage good hydration /PO intake Recheck levels in 2-3 weeks and decide on metformin dosing at that time.

## 2023-10-16 NOTE — Assessment & Plan Note (Signed)
Chronic, stable on current regimen.  

## 2023-10-16 NOTE — Assessment & Plan Note (Signed)
Continue to monitor weight loss in setting of recent SBO and anorexia, wife notes appetite is improving

## 2023-10-16 NOTE — Assessment & Plan Note (Signed)
Continues regular eliquis and nifedipine

## 2023-10-16 NOTE — Assessment & Plan Note (Signed)
Continues omeprazole 40mg  daily. Check magnesium.

## 2023-10-16 NOTE — Assessment & Plan Note (Addendum)
Home sleep apnea test 10/2021 - AHI 20.9/hr, lowest O2 sat 79% with average of 92%, demonstrating moderate OSA/hypopnea syndrome, rec CPAP therapy, consider return to sleep lab for CPAP titration study.  [facility] compliance report 10/2022: 98% days >4 hours, CPAP 19cmH2O, AHI 8.8 He's had trouble tolerating CPAP mask.  Will refer to local pulmonologist to establish care

## 2023-10-16 NOTE — Assessment & Plan Note (Addendum)
[place] Will refer to local pulm to follow nodules in ex smoker.

## 2023-10-16 NOTE — Assessment & Plan Note (Signed)
Quit smoking 2007 See below re: planned pulm nodule f/u

## 2023-10-16 NOTE — Assessment & Plan Note (Addendum)
Recent hospitalization for this, records reviewed. I don't have access to the CT scan done showing the SBO. This seems to have resolved with conservative measures.  Agree with gen surg eval for colonoscopy as overdue - referral previously placed. Will check with referral coordinators regarding this.  He will go to local [facility] draw station in 2-3 wks to update labs.

## 2023-10-16 NOTE — Assessment & Plan Note (Addendum)
Adequate readings based on latest A1c. Metformin was held due to AKI during recent hospitalization.  Will recheck kidney in 2-3 weeks and decide on metformin use at that time. Continue jardiance 10mg  daily, consider titration pending above. No symptoms of UTI, yeast infection, groin cellulitis. Consider GLP1RA weekly injectable.

## 2023-10-16 NOTE — Assessment & Plan Note (Signed)
Diagnosed with CAP on ER eval last month, completed prednisone and doxycycline course. Notes ongoing cough, present for >6 wks.  Tessalon hasn't helped, OTC cough remedies haven't helped. Rx phenergan /guaifenesin cough syrup.  [place]  Agrees to restart Anoro Ellipta for h/o COPD .

## 2023-10-16 NOTE — Assessment & Plan Note (Signed)
Mild centrilobular emphysema by CT 2019.  He has not been taking anoro ellipta - agrees to retry in setting of ongoing cough from recent CAP.

## 2023-10-17 ENCOUNTER — Encounter: Payer: Self-pay | Admitting: Family Medicine

## 2023-10-17 DIAGNOSIS — I10 Essential (primary) hypertension: Secondary | ICD-10-CM

## 2023-10-27 ENCOUNTER — Telehealth: Payer: Self-pay | Admitting: Family Medicine

## 2023-10-27 MED ORDER — FUROSEMIDE 40 MG PO TABS: 80.0000 mg | ORAL_TABLET | Freq: Every day | ORAL | 2 refills | Status: DC

## 2023-10-27 NOTE — Telephone Encounter (Signed)
 Referrals were already placed earlier in the month. Dr Maren Beach is general surgeon who does colonoscopies at the [facility].

## 2023-10-27 NOTE — Telephone Encounter (Signed)
 Copied from CRM [id number]. Topic: Referral - Request for Referral >> Oct 27, 2023 10:30 AM Sonny Dandy B wrote: Did the patient discuss referral with their provider in the last year? Yes (If No - schedule appointment) (If Yes - send message)  Appointment offered? No  Type of order/referral and detailed reason for visit: 1. Pulmonalogist,  2.Colonoscopy   Preference of office, provider, location: Dr Ronne [facility] in [place]. Fax#[phone], Phone, # [phone], 2. [place] office#[phone]   If referral order, have you been seen by this specialty before? No (If Yes, this issue or another issue? When? Where?  Can we respond through MyChart? Yes

## 2023-10-28 LAB — HEMOGLOBIN A1C: Hemoglobin A1C: 6.8

## 2023-10-29 ENCOUNTER — Telehealth: Payer: Self-pay | Admitting: Family Medicine

## 2023-10-29 NOTE — Telephone Encounter (Signed)
 Copied from CRM [id number]. Topic: Referral - Status >> Oct 29, 2023 11:31 AM Thomes Dinning wrote: Reason for CRM: Patient's spouse Agustin Cree called to provide an email address to the to where she would the referral to go to  Dr. [email or web address] Beach [email or web address]

## 2023-11-01 ENCOUNTER — Other Ambulatory Visit: Payer: Self-pay | Admitting: Family Medicine

## 2023-11-01 ENCOUNTER — Telehealth: Payer: Self-pay | Admitting: Family Medicine

## 2023-11-01 NOTE — Telephone Encounter (Signed)
 Promethazine-DM Last rx:  10/13/23, #118 mL Last OV:  09/21/23, CPE Next OV:  none

## 2023-11-01 NOTE — Telephone Encounter (Signed)
 Copied from CRM [id number]. Topic: Clinical - Medication Question >> Nov 01, 2023  3:09 PM Adele Barthel wrote: Reason for CRM:   Patient is reporting the promethazine-dextromethorphan (PROMETHAZINE-DM) 6.25-15 MG/5ML syrup has been very beneficial for his cough, which is improving. He has only needed to take once a day and it controls his cough for most of the day. He is requesting if the medication could be renewed with refills, to help with his cough.   CB# [phone]

## 2023-11-01 NOTE — Addendum Note (Signed)
 Addended by: Nanci Pina on: 11/01/2023 03:36 PM   Modules accepted: Orders

## 2023-11-01 NOTE — Telephone Encounter (Signed)
 Copied from CRM [id number]. Topic: Clinical - Lab/Test Results >> Nov 01, 2023  3:14 PM Adele Barthel wrote: Reason for CRM:   Patient's wife is reporting they had the labwork ordered on 02/12 performed today and wanted to notify provider so he can review results and advise her and patient

## 2023-11-04 ENCOUNTER — Encounter: Payer: Self-pay | Admitting: Family Medicine

## 2023-11-04 ENCOUNTER — Telehealth: Payer: Self-pay | Admitting: Family Medicine

## 2023-11-04 MED ORDER — PROMETHAZINE-DM 6.25-15 MG/5ML PO SYRP: 5.0000 mL | ORAL_SOLUTION | Freq: Three times a day (TID) | ORAL | 0 refills | Status: DC | PRN

## 2023-11-04 NOTE — Telephone Encounter (Signed)
 Copied from CRM [id number]. Topic: Referral - Status >> Nov 04, 2023  3:17 PM Dondra Prader E wrote: Reason for CRM: Pt's wife called reporting that the patient is having trouble getting his referral to Dr. Eveline Keto.   Best contact: [phone]   She says they only received 2 out of 15 pages, they are still missing necessary information. She is requesting a call back to discuss this with the referrals coordinator.

## 2023-11-05 NOTE — Telephone Encounter (Addendum)
 Per the Referral notes these documents were emailed per the offices request yesterday afternoon 3/7 close to 5pm.   Referral Notes:  William Hebert  11/05/2023  04:55:20 PM  Comments   Pt said the office did not get referral via fax so can I please email.  Emailed to [email or web address]   The office will have to have time to receive and review the notes we emailed

## 2023-11-05 NOTE — Telephone Encounter (Signed)
 Noted.

## 2023-11-10 LAB — RENAL FUNCTION PANEL
Albumin: 4 g/dL (ref 3.8–4.8)
BUN/Creatinine Ratio: 13 (ref 10–24)
BUN: 19 mg/dL (ref 8–27)
CO2: 23 mmol/L (ref 20–29)
Calcium: 9.4 mg/dL (ref 8.6–10.2)
Chloride: 103 mmol/L (ref 96–106)
Creatinine, Ser: 1.47 mg/dL — ABNORMAL HIGH (ref 0.76–1.27)
Glucose: 172 mg/dL — ABNORMAL HIGH (ref 70–99)
Phosphorus: 2.7 mg/dL — ABNORMAL LOW (ref 2.8–4.1)
Potassium: 4 mmol/L (ref 3.5–5.2)
Sodium: 140 mmol/L (ref 134–144)
eGFR: 49 mL/min/{1.73_m2} — ABNORMAL LOW (ref >59)

## 2023-11-10 LAB — PROTEIN ELECTROPHORESIS, SERUM, WITH REFLEX
A/G Ratio: 1.3 (ref 0.7–1.7)
Albumin ELP: 3.2 g/dL (ref 2.9–4.4)
Alpha 1: 0.2 g/dL (ref 0.0–0.4)
Alpha 2: 1 g/dL (ref 0.4–1.0)
Beta: 0.8 g/dL (ref 0.7–1.3)
Gamma Globulin: 0.4 g/dL (ref 0.4–1.8)
Globulin, Total: 2.5 g/dL (ref 2.2–3.9)
Total Protein: 5.7 g/dL — ABNORMAL LOW (ref 6.0–8.5)

## 2023-11-10 LAB — MAGNESIUM: Magnesium: 2 mg/dL (ref 1.6–2.3)

## 2023-11-10 LAB — TSH: TSH: 3.56 u[IU]/mL (ref 0.450–4.500)

## 2023-11-10 LAB — VITAMIN B12: Vitamin B-12: 478 pg/mL (ref 232–1245)

## 2023-11-15 ENCOUNTER — Telehealth: Payer: Self-pay

## 2023-11-15 DIAGNOSIS — Z1283 Encounter for screening for malignant neoplasm of skin: Secondary | ICD-10-CM

## 2023-11-15 NOTE — Telephone Encounter (Signed)
 Copied from CRM [id number]. Topic: General - Other >> Nov 15, 2023 12:35 PM Rodman Pickle T wrote: Reason for CRM: patient wife would like a referral to dr Fara Boros taylor phone number [phone] she is requesting the patient to see a dermatologist regarding possible skin cancer  [address] [place] [address] she would like a like a back regarding this referral for her husband

## 2023-11-16 ENCOUNTER — Encounter: Payer: Self-pay | Admitting: *Deleted

## 2023-11-16 NOTE — Telephone Encounter (Signed)
 Noted. See referral for updates

## 2023-11-16 NOTE — Addendum Note (Signed)
 Addended by: Eustaquio Boyden on: 11/16/2023 09:58 AM   Modules accepted: Orders

## 2023-11-16 NOTE — Telephone Encounter (Signed)
 Derm referral placed

## 2023-11-18 ENCOUNTER — Encounter: Payer: Self-pay | Admitting: Family Medicine

## 2023-11-19 NOTE — Telephone Encounter (Signed)
Printed and placed in your box for review

## 2023-11-20 NOTE — Telephone Encounter (Signed)
 I asked them to send [place] page 8 (they accidentally sent page 7 twice). When sent please print page 8 for my review prior to send for scanning.

## 2023-11-23 NOTE — Telephone Encounter (Signed)
 They have not read my mychart message. Can you ask them to resend William Hebert' page 8? 2 page 7s were accidentally sent to [place].

## 2023-11-24 NOTE — Telephone Encounter (Signed)
 Left message on vm, per dpr, notifying pt we received page 7 of [employer] Directive twice but missing page 8. Asked pt to please upload and send page 8. Also, notified pt to refer to Dr Timoteo Expose MyChart message sent 11/20/23.

## 2023-11-25 NOTE — Telephone Encounter (Signed)
 Printed page 8 of pt's [place] Directive and placed in Dr Timoteo Expose box.

## 2023-12-02 ENCOUNTER — Telehealth: Payer: Self-pay

## 2023-12-02 NOTE — Telephone Encounter (Signed)
 Received faxed CPAP/BiPAP/ASV Physician Order from [facility] ([facility]   Placed order in Dr Timoteo Expose box.

## 2023-12-03 NOTE — Telephone Encounter (Signed)
 Faxed orders to [facility] ([facility]) at [phone].

## 2023-12-03 NOTE — Telephone Encounter (Signed)
 Filled and in Lisa's box.

## 2024-01-01 ENCOUNTER — Encounter: Payer: Self-pay | Admitting: Family Medicine

## 2024-01-03 NOTE — Telephone Encounter (Signed)
 Lvm asking pt to call back to schedule video visit with Dr Crissie Dome.   I will also send MyChart message.

## 2024-01-05 ENCOUNTER — Telehealth (INDEPENDENT_AMBULATORY_CARE_PROVIDER_SITE_OTHER): Admitting: Family Medicine

## 2024-01-05 ENCOUNTER — Encounter: Payer: Self-pay | Admitting: Family Medicine

## 2024-01-05 VITALS — BP 126/68 | Ht 70.0 in | Wt 305.0 lb

## 2024-01-05 DIAGNOSIS — D126 Benign neoplasm of colon, unspecified: Secondary | ICD-10-CM

## 2024-01-05 DIAGNOSIS — R911 Solitary pulmonary nodule: Secondary | ICD-10-CM

## 2024-01-05 DIAGNOSIS — L659 Nonscarring hair loss, unspecified: Secondary | ICD-10-CM

## 2024-01-05 DIAGNOSIS — E1169 Type 2 diabetes mellitus with other specified complication: Secondary | ICD-10-CM

## 2024-01-05 DIAGNOSIS — Z7984 Long term (current) use of oral hypoglycemic drugs: Secondary | ICD-10-CM

## 2024-01-05 NOTE — Progress Notes (Unsigned)
 Ph: [phone] Fax: [phone]   Patient ID: William Hebert, male    DOB: 06-30-1946, 78 y.o.   MRN: [id number]  Virtual visit completed through MyChart, a video enabled telemedicine application. Due to national recommendations of social distancing due to COVID-19, a virtual visit is felt to be most appropriate for this patient at this time. Reviewed limitations, risks, security and privacy concerns of performing a virtual visit and the availability of in person appointments. I also reviewed that there may be a patient responsible charge related to this service. The patient agreed to proceed.   Patient location: home Provider location: [place] at [facility], office Persons participating in this virtual visit: patient, provider   If any vitals were documented, they were collected by patient at home unless specified below.    BP 126/68 Comment: per patient this morning  Ht 5\' 10"  (1.778 m)   Wt (!) 305 lb (138.3 kg) Comment: per patient  BMI 43.76 kg/m    CC: hair loss  Subjective:   HPI: William Hebert is a 78 y.o. male presenting on 01/05/2024 for Alopecia (X1-2wks; "if I wet my hair and wipe with wash cloth, hair is coming out with it; no pain with brushing")   Sudden onset development of hair loss over the past 2 weeks.  Notes clump of hair on towel when drying hair after shower every morning.  Diffuse hair loss.  No skin rash redness or irritation to scalp.  No new soaps shampoos conditioner or detergent.  Uses 2 in 1 head and shoulders anti-dandruff shampoo.  No new medicines, supplements.   Will check labwork   Recent hospitalization at [facility] in [facility] for SBO treated conservatively. Also had AKI.  Recent skin cancer s/p excisional biopsy ?melanoma - sees derm for this. Planning Mohs surgery.   Saw gen surgery Dr Janeice Medal - s/p colonoscopy 12/27/2023 showing 12 polyps, 11 precancerous, planned repeat in 1 year.   Pending pulm Dr Ma Saupe -  upcoming appt August.  Requests I schedule imaging study prior to pulm appt.   Sugars have been staying well controlled - 137 fasting.  Has stayed off metformin       Relevant past medical, surgical, family and social history reviewed and updated as indicated. Interim medical history since our last visit reviewed. Allergies and medications reviewed and updated. Outpatient Medications Prior to Visit  Medication Sig Dispense Refill   Accu-Chek Softclix Lancets lancets Check blood sugar once daily 100 each 1   acetaminophen  (TYLENOL ) 500 MG tablet Take 1,000 mg by mouth every 6 (six) hours as needed for moderate pain or mild pain.     apixaban (ELIQUIS) 5 MG TABS tablet Take 5 mg by mouth 2 (two) times daily.     Ascorbic Acid (VITAMIN C PO) Take 1,000 mg by mouth daily. Super C     aspirin  EC 81 MG tablet Take 1 tablet (81 mg total) by mouth daily. Day after surgery 30 tablet 1   B Complex-C (SUPER B COMPLEX PO) Take 1 tablet by mouth daily.     Blood Glucose Monitoring Suppl (ACCU-CHEK GUIDE) w/Device KIT Use as instructed to check blood sugar once daily. 1 kit 0   Cholecalciferol (VITAMIN D3) 1.25 MG (50000 UT) CAPS TAKE 1 CAPSULE BY MOUTH EVERY FRIDAY IN THE MORNING 12 capsule 1   empagliflozin  (JARDIANCE ) 10 MG TABS tablet Take 1 tablet (10 mg total) by mouth daily. 90 tablet 4   furosemide  (LASIX ) 40 MG  tablet Take 2 tablets (80 mg total) by mouth daily. 180 tablet 2   glucose blood (ACCU-CHEK AVIVA PLUS) test strip Use to check blood sugar once daily 100 each 1   labetalol  (NORMODYNE ) 200 MG tablet Take 1 tablet (200 mg total) by mouth daily. 90 tablet 4   Multiple Vitamins-Minerals (MULTIVITAMIN ADULT PO) Take 1 tablet by mouth daily. MEN'S 50+ MULTIVITAMIN.     NIFEdipine  (PROCARDIA  XL/NIFEDICAL XL) 60 MG 24 hr tablet Take 1 tablet (60 mg total) by mouth daily. 90 tablet 4   omeprazole  (PRILOSEC) 40 MG capsule Take 1 capsule (40 mg total) by mouth daily. 90 capsule 4   potassium  chloride SA (KLOR-CON  M) 20 MEQ tablet Take 1 tablet (20 mEq total) by mouth daily. 90 tablet 4   promethazine -dextromethorphan (PROMETHAZINE -DM) 6.25-15 MG/5ML syrup Take 5 mLs by mouth 3 (three) times daily as needed for cough (sedation precautions). 118 mL 0   rosuvastatin  (CRESTOR ) 20 MG tablet Take 1 tablet (20 mg total) by mouth daily. 90 tablet 4   umeclidinium-vilanterol (ANORO ELLIPTA ) 62.5-25 MCG/ACT AEPB Inhale 1 puff into the lungs daily. 60 each 5   valsartan  (DIOVAN ) 80 MG tablet Take 1 tablet (80 mg total) by mouth daily. 90 tablet 4   Zinc  100 MG TABS Take 1 tablet (100 mg total) by mouth daily. 90 tablet 3   metFORMIN  (GLUCOPHAGE ) 500 MG tablet Take 1 tablet (500 mg total) by mouth daily with breakfast. (Patient not taking: Reported on 01/05/2024) 90 tablet 4   No facility-administered medications prior to visit.     Per HPI unless specifically indicated in ROS section below Review of Systems Objective:  BP 126/68 Comment: per patient this morning  Ht 5\' 10"  (1.778 m)   Wt (!) 305 lb (138.3 kg) Comment: per patient  BMI 43.76 kg/m   Wt Readings from Last 3 Encounters:  01/05/24 (!) 305 lb (138.3 kg)  10/13/23 299 lb (135.6 kg)  09/28/23 (!) 307 lb 12.8 oz (139.6 kg)       Physical exam: Gen: alert, NAD, not ill appearing Pulm: speaks in complete sentences without increased work of breathing Psych: normal mood, normal thought content      Results for orders placed or performed in visit on 01/05/24  Hemoglobin A1c   Collection Time: 10/28/23 12:00 AM  Result Value Ref Range   Hemoglobin A1C 6.8    Lab Results  Component Value Date   HGBA1C 6.8 10/28/2023   Last A1c 6.8% (10/2023).   Assessment & Plan:   Hair loss -     TSH; Future -     CBC with Differential/Platelet; Future -     Comprehensive metabolic panel with GFR; Future -     Iron, TIBC and Ferritin Panel; Future     I discussed the assessment and treatment plan with the patient. The patient was  provided an opportunity to ask questions and all were answered. The patient agreed with the plan and demonstrated an understanding of the instructions. The patient was advised to call back or seek an in-person evaluation if the symptoms worsen or if the condition fails to improve as anticipated.  Follow up plan: No follow-ups on file.  Claire Crick, MD

## 2024-01-06 DIAGNOSIS — D126 Benign neoplasm of colon, unspecified: Secondary | ICD-10-CM | POA: Insufficient documentation

## 2024-01-06 NOTE — Assessment & Plan Note (Signed)
 12 polyps recently removed, 11 were precancerous per pt/wife.  Planned rpt colonoscopy in 1 year.  [place]

## 2024-01-06 NOTE — Assessment & Plan Note (Signed)
 Latest CT chest 01/2023 showing semisolid nodule to RUL 10mm in diameter, stable.  Will be due for rpt CT this year.  [place]  They request I order f/u CT scan to be done after 02/25/2024 - will order

## 2024-01-06 NOTE — Assessment & Plan Note (Signed)
 Sugars overall stable off metformin  - latest 137 fasting.  He is only on jardiance  10mg  daily  Will stay off metformin  - removed from med list.

## 2024-01-06 NOTE — Assessment & Plan Note (Addendum)
 Suspect telogen effluvium after stressful period s/p hospitalization for SBO treated conservatively as well as acute kidney injury on chronic kidney disease.  Will recommend labs to r/o other cause (check TSH, iron panel).  Will update kidney function as well.  Pending results, recommend monitoring, trial biotin supplement.

## 2024-01-07 ENCOUNTER — Encounter: Payer: Self-pay | Admitting: Family Medicine

## 2024-01-07 LAB — COMPREHENSIVE METABOLIC PANEL WITH GFR
ALT: 17 IU/L (ref 0–44)
AST: 19 IU/L (ref 0–40)
Albumin: 4.1 g/dL (ref 3.8–4.8)
Alkaline Phosphatase: 93 IU/L (ref 44–121)
BUN/Creatinine Ratio: 10 (ref 10–24)
BUN: 14 mg/dL (ref 8–27)
Bilirubin Total: 0.3 mg/dL (ref 0.0–1.2)
CO2: 22 mmol/L (ref 20–29)
Calcium: 9.3 mg/dL (ref 8.6–10.2)
Chloride: 104 mmol/L (ref 96–106)
Creatinine, Ser: 1.36 mg/dL — ABNORMAL HIGH (ref 0.76–1.27)
Globulin, Total: 1.9 g/dL (ref 1.5–4.5)
Glucose: 200 mg/dL — ABNORMAL HIGH (ref 70–99)
Potassium: 3.9 mmol/L (ref 3.5–5.2)
Sodium: 143 mmol/L (ref 134–144)
Total Protein: 6 g/dL (ref 6.0–8.5)
eGFR: 54 mL/min/{1.73_m2} — ABNORMAL LOW (ref >59)

## 2024-01-07 LAB — CBC WITH DIFFERENTIAL/PLATELET
Basophils Absolute: 0.1 10*3/uL (ref 0.0–0.2)
Basos: 1 %
EOS (ABSOLUTE): 0.1 10*3/uL (ref 0.0–0.4)
Eos: 1 %
Hematocrit: 44.4 % (ref 37.5–51.0)
Hemoglobin: 14.8 g/dL (ref 13.0–17.7)
Immature Grans (Abs): 0.1 10*3/uL (ref 0.0–0.1)
Immature Granulocytes: 1 %
Lymphocytes Absolute: 1.1 10*3/uL (ref 0.7–3.1)
Lymphs: 13 %
MCH: 30.3 pg (ref 26.6–33.0)
MCHC: 33.3 g/dL (ref 31.5–35.7)
MCV: 91 fL (ref 79–97)
Monocytes Absolute: 0.6 10*3/uL (ref 0.1–0.9)
Monocytes: 8 %
Neutrophils Absolute: 6.4 10*3/uL (ref 1.4–7.0)
Neutrophils: 76 %
Platelets: 180 10*3/uL (ref 150–450)
RBC: 4.88 x10E6/uL (ref 4.14–5.80)
RDW: 13.2 % (ref 11.6–15.4)
WBC: 8.3 10*3/uL (ref 3.4–10.8)

## 2024-01-07 LAB — IRON,TIBC AND FERRITIN PANEL
Ferritin: 143 ng/mL (ref 30–400)
Iron Saturation: 24 % (ref 15–55)
Iron: 69 ug/dL (ref 38–169)
Total Iron Binding Capacity: 285 ug/dL (ref 250–450)
UIBC: 216 ug/dL (ref 111–343)

## 2024-01-07 LAB — TSH: TSH: 2.22 u[IU]/mL (ref 0.450–4.500)

## 2024-02-02 ENCOUNTER — Telehealth: Payer: Self-pay | Admitting: Pulmonary Disease

## 2024-02-02 NOTE — Telephone Encounter (Signed)
 Pt needs a new CT Chest W/O ordered by another provider, as Dr. Thelda Finney is no longer with our office. Thank you

## 2024-02-02 NOTE — Telephone Encounter (Signed)
 William Hebert can you advise of new CT order. Thank you

## 2024-02-04 ENCOUNTER — Other Ambulatory Visit: Payer: Self-pay | Admitting: Acute Care

## 2024-02-04 DIAGNOSIS — R911 Solitary pulmonary nodule: Secondary | ICD-10-CM

## 2024-02-18 ENCOUNTER — Ambulatory Visit

## 2024-02-25 ENCOUNTER — Ambulatory Visit

## 2024-02-27 ENCOUNTER — Other Ambulatory Visit: Payer: Self-pay | Admitting: Family Medicine

## 2024-02-27 DIAGNOSIS — E559 Vitamin D deficiency, unspecified: Secondary | ICD-10-CM

## 2024-02-28 ENCOUNTER — Telehealth (INDEPENDENT_AMBULATORY_CARE_PROVIDER_SITE_OTHER): Admitting: Family Medicine

## 2024-02-28 ENCOUNTER — Encounter: Payer: Self-pay | Admitting: Family Medicine

## 2024-02-28 VITALS — BP 117/73 | HR 80 | Temp 96.6°F | Ht 70.0 in | Wt 314.0 lb

## 2024-02-28 DIAGNOSIS — I480 Paroxysmal atrial fibrillation: Secondary | ICD-10-CM

## 2024-02-28 DIAGNOSIS — E1169 Type 2 diabetes mellitus with other specified complication: Secondary | ICD-10-CM

## 2024-02-28 DIAGNOSIS — Z7984 Long term (current) use of oral hypoglycemic drugs: Secondary | ICD-10-CM

## 2024-02-28 DIAGNOSIS — R197 Diarrhea, unspecified: Secondary | ICD-10-CM

## 2024-02-28 DIAGNOSIS — R310 Gross hematuria: Secondary | ICD-10-CM

## 2024-02-28 DIAGNOSIS — R911 Solitary pulmonary nodule: Secondary | ICD-10-CM | POA: Diagnosis not present

## 2024-02-28 NOTE — Progress Notes (Unsigned)
 Ph: [phone] Fax: [phone]   Patient ID: William Hebert, male    DOB: 1946-03-09, 78 y.o.   MRN: [id number]  Virtual visit completed through MyChart, a video enabled telemedicine application. Due to national recommendations of social distancing due to COVID-19, a virtual visit is felt to be most appropriate for this patient at this time. Reviewed limitations, risks, security and privacy concerns of performing a virtual visit and the availability of in person appointments. I also reviewed that there may be a patient responsible charge related to this service. The patient agreed to proceed.   Patient location: home Provider location: [place] at [facility], office Persons participating in this virtual visit: patient, provider   If any vitals were documented, they were collected by patient at home unless specified below.    BP 117/73   Pulse 80   Temp (!) 96.6 F (35.9 C)   Ht 5' 10 (1.778 m)   Wt (!) 314 lb (142.4 kg)   BMI 45.05 kg/m    CC: ER f/u visit  Subjective:   HPI: William Hebert is a 78 y.o. male presenting on 02/28/2024 for Hospitalization Follow-up (Seen on 6/28/2, dx UTI w/hematuria; cough. Had CXR no pneumothrax. Urine pH 6.0, urine protein 3+A, urine glucose 2+A, urine ketones- neg, urine blood- 3+A. Pt accompanied by wife, William Hebert. )   Recent UCC followed by ER visit 02/26/2024 for blood in urine along with cough - UA showed 3+ blood, 3+ protein and 2+ glucose. CXR negative for PNA, effusion pneumothorax - lungs clear. He was treated with generic keflex  QID x7 days. He's overall feeling better but notes persistent cough productive of colored mucous. Promethazine  cough syrup hasn't helped.  He noted significant amt of gross blood in urine - without UTI symptoms of dysuria, urgency, frequency, flank pain, suprapubic pain, fevers, nausea/vomiting.  Hematuria has improved since being on antibiotics.   He is using imodium AD for diarrhea that started after abx.   ER labs reviewed - Cr 1.5, UA abnormal, WBC 8.8, Hgb 13. Unsure if UCx obtained.  Lab Results  Component Value Date   HGBA1C 6.8 10/28/2023    Pending CT chest then [place] is going to have CT chest done locally when they're in town at end of next month.   Known parox afib on chronic eliquis followed by cardiology Dr Anola.  S/p abd aortic aneurysm repair.   Ex smoker quit 2007 - ~75 PY hx.      Relevant past medical, surgical, family and social history reviewed and updated as indicated. Interim medical history since our last visit reviewed. Allergies and medications reviewed and updated. Outpatient Medications Prior to Visit  Medication Sig Dispense Refill   Accu-Chek Softclix Lancets lancets Check blood sugar once daily 100 each 1   acetaminophen  (TYLENOL ) 500 MG tablet Take 1,000 mg by mouth every 6 (six) hours as needed for moderate pain or mild pain.     apixaban (ELIQUIS) 5 MG TABS tablet Take 5 mg by mouth 2 (two) times daily.     Ascorbic Acid (VITAMIN C PO) Take 1,000 mg by mouth daily. Super C     aspirin  EC 81 MG tablet Take 1 tablet (81 mg total) by mouth daily. Day after surgery 30 tablet 1   B Complex-C (SUPER B COMPLEX PO) Take 1 tablet by mouth daily.     Blood Glucose Monitoring Suppl (ACCU-CHEK GUIDE) w/Device KIT Use as instructed  to check blood sugar once daily. 1 kit 0   cephALEXin  (KEFLEX ) 500 MG capsule Take 500 mg by mouth 4 (four) times daily.     Cholecalciferol (VITAMIN D3) 1.25 MG (50000 UT) CAPS TAKE 1 CAPSULE BY MOUTH EVERY FRIDAY IN THE MORNING 12 capsule 1   empagliflozin  (JARDIANCE ) 10 MG TABS tablet Take 1 tablet (10 mg total) by mouth daily. 90 tablet 4   furosemide  (LASIX ) 40 MG tablet Take 2 tablets (80 mg total) by mouth daily. 180 tablet 2   glucose blood (ACCU-CHEK AVIVA PLUS) test strip Use to check blood sugar once daily 100 each 1   labetalol  (NORMODYNE ) 200 MG tablet Take 1 tablet (200 mg  total) by mouth daily. 90 tablet 4   Multiple Vitamins-Minerals (MULTIVITAMIN ADULT PO) Take 1 tablet by mouth daily. MEN'S 50+ MULTIVITAMIN.     NIFEdipine  (PROCARDIA  XL/NIFEDICAL XL) 60 MG 24 hr tablet Take 1 tablet (60 mg total) by mouth daily. 90 tablet 4   omeprazole  (PRILOSEC) 40 MG capsule Take 1 capsule (40 mg total) by mouth daily. 90 capsule 4   potassium chloride  SA (KLOR-CON  M) 20 MEQ tablet Take 1 tablet (20 mEq total) by mouth daily. 90 tablet 4   promethazine -dextromethorphan (PROMETHAZINE -DM) 6.25-15 MG/5ML syrup Take 5 mLs by mouth 3 (three) times daily as needed for cough (sedation precautions). 118 mL 0   rosuvastatin  (CRESTOR ) 20 MG tablet Take 1 tablet (20 mg total) by mouth daily. 90 tablet 4   umeclidinium-vilanterol (ANORO ELLIPTA ) 62.5-25 MCG/ACT AEPB Inhale 1 puff into the lungs daily. 60 each 5   valsartan  (DIOVAN ) 80 MG tablet Take 1 tablet (80 mg total) by mouth daily. 90 tablet 4   Zinc  100 MG TABS Take 1 tablet (100 mg total) by mouth daily. 90 tablet 3   No facility-administered medications prior to visit.     Per HPI unless specifically indicated in ROS section below Review of Systems Objective:  BP 117/73   Pulse 80   Temp (!) 96.6 F (35.9 C)   Ht 5' 10 (1.778 m)   Wt (!) 314 lb (142.4 kg)   BMI 45.05 kg/m   Wt Readings from Last 3 Encounters:  02/28/24 (!) 314 lb (142.4 kg)  01/05/24 (!) 305 lb (138.3 kg)  10/13/23 299 lb (135.6 kg)       Physical exam: Gen: alert, NAD, not ill appearing Pulm: speaks in complete sentences without increased work of breathing Psych: normal mood, normal thought content      Results for orders placed or performed in visit on 01/05/24  Hemoglobin A1c   Collection Time: 10/28/23 12:00 AM  Result Value Ref Range   Hemoglobin A1C 6.8   Iron, TIBC and Ferritin Panel   Collection Time: 01/06/24  3:45 PM  Result Value Ref Range   Total Iron Binding Capacity 285 250 - 450 ug/dL   UIBC 783 888 - 656 ug/dL   Iron  69 38 - 830 ug/dL   Iron Saturation 24 15 - 55 %   Ferritin 143 30 - 400 ng/mL  Comprehensive metabolic panel with GFR   Collection Time: 01/06/24  3:45 PM  Result Value Ref Range   Glucose 200 (H) 70 - 99 mg/dL   BUN 14 8 - 27 mg/dL   Creatinine, Ser 8.63 (H) 0.76 - 1.27 mg/dL   eGFR 54 (L) >40 fO/fpw/8.26   BUN/Creatinine Ratio 10 10 - 24   Sodium 143 134 - 144 mmol/L   Potassium 3.9  3.5 - 5.2 mmol/L   Chloride 104 96 - 106 mmol/L   CO2 22 20 - 29 mmol/L   Calcium  9.3 8.6 - 10.2 mg/dL   Total Protein 6.0 6.0 - 8.5 g/dL   Albumin  4.1 3.8 - 4.8 g/dL   Globulin, Total 1.9 1.5 - 4.5 g/dL   Bilirubin Total 0.3 0.0 - 1.2 mg/dL   Alkaline Phosphatase 93 44 - 121 IU/L   AST 19 0 - 40 IU/L   ALT 17 0 - 44 IU/L  CBC with Differential/Platelet   Collection Time: 01/06/24  3:45 PM  Result Value Ref Range   WBC 8.3 3.4 - 10.8 x10E3/uL   RBC 4.88 4.14 - 5.80 x10E6/uL   Hemoglobin 14.8 13.0 - 17.7 g/dL   Hematocrit 55.5 62.4 - 51.0 %   MCV 91 79 - 97 fL   MCH 30.3 26.6 - 33.0 pg   MCHC 33.3 31.5 - 35.7 g/dL   RDW 86.7 88.3 - 84.5 %   Platelets 180 150 - 450 x10E3/uL   Neutrophils 76 Not Estab. %   Lymphs 13 Not Estab. %   Monocytes 8 Not Estab. %   Eos 1 Not Estab. %   Basos 1 Not Estab. %   Neutrophils Absolute 6.4 1.4 - 7.0 x10E3/uL   Lymphocytes Absolute 1.1 0.7 - 3.1 x10E3/uL   Monocytes Absolute 0.6 0.1 - 0.9 x10E3/uL   EOS (ABSOLUTE) 0.1 0.0 - 0.4 x10E3/uL   Basophils Absolute 0.1 0.0 - 0.2 x10E3/uL   Immature Granulocytes 1 Not Estab. %   Immature Grans (Abs) 0.1 0.0 - 0.1 x10E3/uL  TSH   Collection Time: 01/06/24  3:45 PM  Result Value Ref Range   TSH 2.220 0.450 - 4.500 uIU/mL   Assessment & Plan:   Gross hematuria Assessment & Plan: New, without typical UTI symptoms. Rec complete keflex  course and recheck UA in 2-3 weeks after finishing abx.  No h/o kidney stones or kidney stone pain.  He is on eliquis.  Merits further evaluation - discussed possible  contrasted CT and cystoscopy - will refer to local urology per their preference.  He is on Jardiance  10mg  - discussed possibly stopping if any recurrent UTI symptoms.   Orders: -     Urinalysis, Routine w reflex microscopic; Future -     Ambulatory referral to Urology  Type 2 diabetes mellitus with other specified complication, without long-term current use of insulin (HCC) Assessment & Plan: Chronic, overall stable only on low dose jardiance  10mg .  Metformin  intolerant - diarrhea and GI upset.  Reassess glycemic control in-person office visit next month.    Pulmonary nodule, right Assessment & Plan: Due for repeat - they have already scheduled for next month when back in town.  [place]    Paroxysmal atrial fibrillation [facility]) Assessment & Plan: Continues eliquis and nifedipine , followed by Cardiologist Dr Anola   Diarrhea, unspecified type Assessment & Plan: This started after abx - will monitor for resolution after completing abx course. Rec start probiotic.       I discussed the assessment and treatment plan with the patient. The patient was provided an opportunity to ask questions and all were answered. The patient agreed with the plan and demonstrated an understanding of the instructions. The patient was advised to call back or seek an in-person evaluation if the symptoms worsen or if the condition fails to improve as anticipated.  Follow up plan: No follow-ups on file.  Drae Mitzel  Rilla, MD

## 2024-02-29 ENCOUNTER — Encounter: Payer: Self-pay | Admitting: Family Medicine

## 2024-02-29 ENCOUNTER — Ambulatory Visit: Admitting: Acute Care

## 2024-02-29 DIAGNOSIS — R197 Diarrhea, unspecified: Secondary | ICD-10-CM | POA: Insufficient documentation

## 2024-02-29 DIAGNOSIS — R310 Gross hematuria: Secondary | ICD-10-CM | POA: Insufficient documentation

## 2024-02-29 NOTE — Assessment & Plan Note (Addendum)
 Due for repeat - they have already scheduled for next month when back in town.  [place]

## 2024-02-29 NOTE — Assessment & Plan Note (Signed)
 Chronic, overall stable only on low dose jardiance  10mg .  Metformin  intolerant - diarrhea and GI upset.  Reassess glycemic control in-person office visit next month.

## 2024-02-29 NOTE — Assessment & Plan Note (Signed)
 Continues eliquis and nifedipine , followed by Cardiologist Dr Anola

## 2024-02-29 NOTE — Assessment & Plan Note (Addendum)
 New, without typical UTI symptoms. Rec complete keflex  course and recheck UA in 2-3 weeks after finishing abx.  No h/o kidney stones or kidney stone pain.  He is on eliquis.  Merits further evaluation - discussed possible contrasted CT and cystoscopy - will refer to local urology per their preference.  He is on Jardiance  10mg  - discussed possibly stopping if any recurrent UTI symptoms.

## 2024-02-29 NOTE — Assessment & Plan Note (Signed)
 This started after abx - will monitor for resolution after completing abx course. Rec start probiotic.

## 2024-03-02 ENCOUNTER — Telehealth: Admitting: Internal Medicine

## 2024-03-02 ENCOUNTER — Ambulatory Visit: Payer: Self-pay

## 2024-03-02 ENCOUNTER — Encounter: Payer: Self-pay | Admitting: Internal Medicine

## 2024-03-02 VITALS — BP 95/71 | HR 78 | Temp 97.6°F | Ht 70.75 in | Wt 314.0 lb

## 2024-03-02 DIAGNOSIS — J441 Chronic obstructive pulmonary disease with (acute) exacerbation: Secondary | ICD-10-CM | POA: Diagnosis not present

## 2024-03-02 MED ORDER — PREDNISONE 20 MG PO TABS
40.0000 mg | ORAL_TABLET | Freq: Every day | ORAL | 0 refills | Status: DC
Start: 2024-03-02 — End: 2024-03-22

## 2024-03-02 MED ORDER — DOXYCYCLINE HYCLATE 100 MG PO TABS: 100.0000 mg | ORAL_TABLET | Freq: Two times a day (BID) | ORAL | 0 refills | Status: DC

## 2024-03-02 MED ORDER — HYDROCODONE BIT-HOMATROP MBR 5-1.5 MG/5ML PO SOLN
5.0000 mL | Freq: Four times a day (QID) | ORAL | 0 refills | Status: DC | PRN
Start: 2024-03-02 — End: 2024-03-22

## 2024-03-02 NOTE — Telephone Encounter (Signed)
 ERx. Will review at upcoming appt

## 2024-03-02 NOTE — Assessment & Plan Note (Signed)
 Clearly having some difficulty breathing but not serious distress On cephalexin  but not handling the apparent respiratory infection  Will add doxy 100  bid x 7 days Prednisone  40mg  daily x 5==then 20mg  daily x 5 Hycodan prn for the severe cough--stay upright for sleep, etc Won't take the anoro--but can use wife's albuterol  inhaler with spacer To ER if breathing gets worse

## 2024-03-02 NOTE — Telephone Encounter (Signed)
 Appreciate Dr Alphonsus Sias seeing pt

## 2024-03-02 NOTE — Progress Notes (Signed)
 Subjective:    Patient ID: William Hebert, male    DOB: 1946-04-29, 78 y.o.   MRN: [id number]  HPI Video virtual visit due to persistent cough Identification done Reviewed limitations and billing and he gave consent Participants---patient in his home (with wife who helps with history) and I am in my office  Was seen by Dr KANDICE with virtual visit 3 days ago---bad cough But concerned about ER visit for hematuria etc and normal CXR then  Up all night coughing--first 2 weeks ago (got from the rest of family) Creamy sputum--despite the cephalexin  No fever--97.6 today Some SOB Chronic bronchitis (since childhood) and COPD now Hasn't had anoro for years  Current Outpatient Medications on File Prior to Visit  Medication Sig Dispense Refill   Accu-Chek Softclix Lancets lancets Check blood sugar once daily 100 each 1   acetaminophen  (TYLENOL ) 500 MG tablet Take 1,000 mg by mouth every 6 (six) hours as needed for moderate pain or mild pain.     apixaban (ELIQUIS) 5 MG TABS tablet Take 5 mg by mouth 2 (two) times daily.     Ascorbic Acid (VITAMIN C PO) Take 1,000 mg by mouth daily. Super C     aspirin  EC 81 MG tablet Take 1 tablet (81 mg total) by mouth daily. Day after surgery 30 tablet 1   B Complex-C (SUPER B COMPLEX PO) Take 1 tablet by mouth daily.     Blood Glucose Monitoring Suppl (ACCU-CHEK GUIDE) w/Device KIT Use as instructed to check blood sugar once daily. 1 kit 0   cephALEXin  (KEFLEX ) 500 MG capsule Take 500 mg by mouth 4 (four) times daily.     Cholecalciferol (VITAMIN D3) 1.25 MG (50000 UT) CAPS TAKE 1 CAPSULE BY MOUTH EVERY FRIDAY IN THE MORNING 12 capsule 0   empagliflozin  (JARDIANCE ) 10 MG TABS tablet Take 1 tablet (10 mg total) by mouth daily. 90 tablet 4   furosemide  (LASIX ) 40 MG tablet Take 2 tablets (80 mg total) by mouth daily. 180 tablet 2   glucose blood (ACCU-CHEK AVIVA PLUS) test strip Use to check blood sugar once daily 100 each 1   labetalol  (NORMODYNE ) 200 MG tablet  Take 1 tablet (200 mg total) by mouth daily. 90 tablet 4   Multiple Vitamins-Minerals (MULTIVITAMIN ADULT PO) Take 1 tablet by mouth daily. MEN'S 50+ MULTIVITAMIN.     NIFEdipine  (PROCARDIA  XL/NIFEDICAL XL) 60 MG 24 hr tablet Take 1 tablet (60 mg total) by mouth daily. 90 tablet 4   omeprazole  (PRILOSEC) 40 MG capsule Take 1 capsule (40 mg total) by mouth daily. 90 capsule 4   potassium chloride  SA (KLOR-CON  M) 20 MEQ tablet Take 1 tablet (20 mEq total) by mouth daily. 90 tablet 4   rosuvastatin  (CRESTOR ) 20 MG tablet Take 1 tablet (20 mg total) by mouth daily. 90 tablet 4   umeclidinium-vilanterol (ANORO ELLIPTA ) 62.5-25 MCG/ACT AEPB Inhale 1 puff into the lungs daily. 60 each 5   valsartan  (DIOVAN ) 80 MG tablet Take 1 tablet (80 mg total) by mouth daily. 90 tablet 4   Zinc  100 MG TABS Take 1 tablet (100 mg total) by mouth daily. 90 tablet 3   promethazine -dextromethorphan (PROMETHAZINE -DM) 6.25-15 MG/5ML syrup Take 5 mLs by mouth 3 (three) times daily as needed for cough (sedation precautions). (Patient not taking: Reported on 03/02/2024) 118 mL 0   No current facility-administered medications on file prior to visit.    Allergies  Allergen Reactions   Multaq [Dronedarone] Diarrhea    Bad  diarrhea    Past Medical History:  Diagnosis Date   AAA (abdominal aortic aneurysm) without rupture (HCC) 07/10/2014   S/p endovascular repair 2012 with stent Soila), yearly f/u   Arthritis    Cancer [facility])    skin cancer non melanioma   Chronic bronchitis (HCC) 11/2016 AND 10-2016   Community acquired pneumonia 09/26/2023   COPD (chronic obstructive pulmonary disease) (HCC)    chronic bronchitis   GERD (gastroesophageal reflux disease)    Headache(784.0)    in 20's   Hepatitis    Carrier cant give blood gives a false positive   Hernia of abdominal wall    LARGE FROM BREAST BONE AREA TO BELLY BUTTON   History of irregular heartbeat    History of skin cancer 2014   HTN (hypertension)     Hyperlipidemia    Hypogonadism in male    prior on testosterone  cream   Knee pain    Left   Morbidly obese (HCC)    Peripheral edema    feet   Pneumonia    PONV (postoperative nausea and vomiting)    PONV AFTER AAA REPAIR, ALL OTHER SURGERIES WENT OK   Posterior vitreous detachment 02/2018   Shortness of breath    WITH EXERTION   Sleep apnea    CPAP     Past Surgical History:  Procedure Laterality Date   ABDOMINAL AORTIC ANEURYSM REPAIR  05/20/2011   enodvascular aorto bifem. stent graft Soila)   CATARACT EXTRACTION Bilateral 2014   X 2 BOTH EYES DONE TWICE   COLONOSCOPY  12/27/2023   12 polyps, 11 precancerous, planned repeat in 1 year ([facility] in [place])   COLONOSCOPY WITH PROPOFOL  N/A 02/23/2017   4 TA polyps, rpt 3-5 yrs ([place])   ELBOW SURGERY  2012   FOOT TENDON SURGERY Right    KNEE SURGERY Right remote   arthroscopy x2   MASS EXCISION N/A 05/10/2013   Procedure: EXCISION back MASS - cyst Cathy A. Cornett, MD)   SHOULDER ARTHROSCOPY WITH ROTATOR CUFF REPAIR AND SUBACROMIAL DECOMPRESSION Left 02/21/2021   Procedure: MINI OPEN  ROTATOR CUFF REPAIR AND SUBACROMIAL DECOMPRESSION;  Surgeon: Duwayne Purchase, MD;  Location: [facility];  Service: Orthopedics;  Laterality: Left;   SHOULDER OPEN ROTATOR CUFF REPAIR Right 07/08/2017   Procedure: Right shoulder mini open rotator cuff repair;  Surgeon: Duwayne Purchase, MD;  Location: [facility];  Service: Orthopedics;  Laterality: Right;  90 mins; Interscalene Block   SKIN SURGERY     2 different surgeries face    UMBILICAL HERNIA REPAIR  2002    Family History  Problem Relation Age of Onset   Cancer Mother 23       throat (nonsmoker)   Heart disease Maternal Aunt    Alcohol abuse Father        deceased 32yo    Social History   Socioeconomic History   Marital status: Married    Spouse name: Not on file   Number of children: 3   Years of education: Not on file   Highest education level: Not on file   Occupational History   Occupation: Academic librarian.    Occupation: works 6 months a year  Tobacco Use   Smoking status: Former    Current packs/day: 0.00    Average packs/day: 1.5 packs/day for 51.0 years (76.5 ttl pk-yrs)    Types: Cigarettes    Start date: 64    Quit date: 2013    Years since quitting:  12.5   Smokeless tobacco: Former    Types: Snuff, [place]    Quit date: 07/06/2006  Vaping Use   Vaping status: Never Used  Substance and Sexual Activity   Alcohol use: Yes    Alcohol/week: 0.0 standard drinks of alcohol    Comment: twice a year   Drug use: No   Sexual activity: Yes    Partners: Female  Other Topics Concern   Not on file  Social History Narrative   Lives with wife   Occ: semi-retired Biochemist, clinical, 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   Edu: HS   Activity: no regular exercise   Diet: no water, sweet tea, some fruits/vegetables   Social Drivers of Corporate investment banker Strain: Low Risk  (09/28/2023)   Overall Financial Resource Strain (CARDIA)    Difficulty of Paying Living Expenses: Not hard at all  Food Insecurity: No Food Insecurity (09/28/2023)   Hunger Vital Sign    Worried About Running Out of Food in the Last Year: Never true    Ran Out of Food in the Last Year: Never true  Transportation Needs: No Transportation Needs (09/28/2023)   PRAPARE - Administrator, Civil Service (Medical): No    Lack of Transportation (Non-Medical): No  Physical Activity: Inactive (01/27/2024)   Received from [facility]   Exercise Vital Sign    On average, how many days per week do you engage in moderate to strenuous exercise (like a brisk walk)?: 0 days    On average, how many minutes do you engage in exercise at this level?: 0 min  Stress: No Stress Concern Present (09/28/2023)   [employer] of Occupational Health - Occupational Stress Questionnaire    Feeling of Stress : Not at all  Social Connections: Socially Integrated  (01/31/2024)   Received from [facility]   Social Connection and Isolation Panel    In a typical week, how many times do you talk on the phone with family, friends, or neighbors?: Three times a week    How often do you get together with friends or relatives?: Twice a week    How often do you attend church or religious services?: More than 4 times per year    Do you belong to any clubs or organizations such as church groups, unions, fraternal or athletic groups, or school groups?: Yes    How often do you attend meetings of the clubs or organizations you belong to?: More than 4 times per year    Are you married, widowed, divorced, separated, never married, or living with a partner?: Married  Intimate Partner Violence: Not At Risk (09/28/2023)   Humiliation, Afraid, Rape, and Kick questionnaire    Fear of Current or Ex-Partner: No    Emotionally Abused: No    Physically Abused: No    Sexually Abused: No   Review of Systems No N/V Able to eat    Objective:   Physical Exam Pulmonary:     Comments: Paroxysms of severe cough during visit. Looks comfortable when not coughing (but still tachypneic) Neurological:     Mental Status: He is alert.            Assessment & Plan:

## 2024-03-02 NOTE — Telephone Encounter (Signed)
 FYI Only or Action Required?: FYI only for provider.  Patient was last seen in primary care on 02/28/2024 by Rilla Baller, MD. Called Nurse Triage reporting Cough. Symptoms began today. Interventions attempted: OTC medications: see note. Symptoms are: unchanged.  Triage Disposition: See Physician Within 24 Hours Scheduled   Patient/caregiver understands and will follow disposition?: Yes         Copied from CRM [id number]. Topic: Clinical - Red Word Triage >> Mar 02, 2024 12:48 PM Macario HERO wrote: Red Word that prompted transfer to Nurse Triage: Patient spouse called and said her spouse has a severe cough. Reason for Disposition  [1] Continuous (nonstop) coughing interferes with work or school AND [2] no improvement using cough treatment per Care Advice  Answer Assessment - Initial Assessment Questions 1. ONSET: When did the cough begin?      ---- Few days ago    2. SEVERITY: How bad is the cough today?      ----- continuous, keeps him up at night.    3. SPUTUM: Describe the color of your sputum (none, dry cough; clear, white, yellow, green)     ---------------- Clear    4. HEMOPTYSIS: Are you coughing up any blood? If so ask: How much? (flecks, streaks, tablespoons, etc.)     ---------------- Denies    5. DIFFICULTY BREATHING: Are you having difficulty breathing? If Yes, ask: How bad is it? (e.g., mild, moderate, severe)    - MILD: No SOB at rest, mild SOB with walking, speaks normally in sentences, can lie down, no retractions, pulse < 100.    - MODERATE: SOB at rest, SOB with minimal exertion and prefers to sit, cannot lie down flat, speaks in phrases, mild retractions, audible wheezing, pulse 100-120.    - SEVERE: Very SOB at rest, speaks in single words, struggling to breathe, sitting hunched forward, retractions, pulse > 120      -----------------------Denies   6. FEVER: Do you have a fever? If Yes, ask: What is your temperature, how was it measured,  and when did it start?     ---------------- Denies   10. OTHER SYMPTOMS: Do you have any other symptoms? (e.g., runny nose, wheezing, chest pain)       ------------------- Denies       Additional Infor:  Pt was recently seen 6/30--- Recommended OTC meds for the cough They have attempted multiple OTC interventions- No relief.  Pt seeking additional medication and guidance for continuous cough.    A Virtual appointment scheduled for patient appropriately.  Patient educated on pertinent s/s that would warrant emergent help/call 911/ ED/UC.  Patient verbalized understanding and agrees with plan No additional questions/concerns noted during the time of the call.  Protocols used: Cough - Acute Productive-A-AH

## 2024-03-17 ENCOUNTER — Ambulatory Visit: Payer: Self-pay | Admitting: Family Medicine

## 2024-03-17 DIAGNOSIS — R8290 Unspecified abnormal findings in urine: Secondary | ICD-10-CM

## 2024-03-17 LAB — MICROSCOPIC EXAMINATION
Casts: NONE SEEN /LPF
RBC, Urine: >30 /HPF — AB (ref 0–2)
WBC, UA: >30 /HPF — AB (ref 0–5)

## 2024-03-17 LAB — URINALYSIS, ROUTINE W REFLEX MICROSCOPIC
Bilirubin, UA: NEGATIVE
Ketones, UA: NEGATIVE
Nitrite, UA: NEGATIVE
Specific Gravity, UA: 1.015 (ref 1.005–1.030)
Urobilinogen, Ur: 0.2 mg/dL (ref 0.2–1.0)
pH, UA: 6.5 (ref 5.0–7.5)

## 2024-03-18 NOTE — Telephone Encounter (Signed)
 Ordered UCx to be drawn at local [facility]

## 2024-03-20 ENCOUNTER — Other Ambulatory Visit: Payer: Self-pay | Admitting: Family Medicine

## 2024-03-20 ENCOUNTER — Encounter: Payer: Self-pay | Admitting: Family Medicine

## 2024-03-20 DIAGNOSIS — R8290 Unspecified abnormal findings in urine: Secondary | ICD-10-CM

## 2024-03-20 MED ORDER — CEPHALEXIN 500 MG PO CAPS: 500.0000 mg | ORAL_CAPSULE | Freq: Two times a day (BID) | ORAL | 0 refills | Status: DC

## 2024-03-20 NOTE — Addendum Note (Signed)
 Addended by: HOPE VEVA PARAS on: 03/20/2024 11:20 AM   Modules accepted: Orders

## 2024-03-20 NOTE — Addendum Note (Signed)
 Addended by: RILLA BALLER on: 03/20/2024 11:10 AM   Modules accepted: Orders

## 2024-03-20 NOTE — Telephone Encounter (Signed)
 Ordered again

## 2024-03-20 NOTE — Addendum Note (Signed)
 Addended by: HOPE VEVA PARAS on: 03/20/2024 09:25 AM   Modules accepted: Orders

## 2024-03-20 NOTE — Addendum Note (Signed)
 Addended by: RILLA BALLER on: 03/20/2024 07:55 AM   Modules accepted: Orders

## 2024-03-21 NOTE — Telephone Encounter (Signed)
 I was able to talk to lab yesterday and send order that they needed to fax number that was verified with them.  No further action needed at this time.

## 2024-03-22 ENCOUNTER — Encounter: Payer: Self-pay | Admitting: Family Medicine

## 2024-03-22 ENCOUNTER — Ambulatory Visit (INDEPENDENT_AMBULATORY_CARE_PROVIDER_SITE_OTHER): Admitting: Family Medicine

## 2024-03-22 ENCOUNTER — Other Ambulatory Visit: Payer: Self-pay | Admitting: Family Medicine

## 2024-03-22 ENCOUNTER — Ambulatory Visit
Admission: RE | Admit: 2024-03-22 | Discharge: 2024-03-22 | Disposition: A | Discharge status: Home / Self Care | Source: Ambulatory Visit | Attending: Acute Care | Admitting: Acute Care

## 2024-03-22 VITALS — BP 136/82 | HR 82 | Temp 98.0°F | Ht 70.75 in | Wt 324.0 lb

## 2024-03-22 DIAGNOSIS — R911 Solitary pulmonary nodule: Secondary | ICD-10-CM | POA: Insufficient documentation

## 2024-03-22 DIAGNOSIS — E1169 Type 2 diabetes mellitus with other specified complication: Secondary | ICD-10-CM

## 2024-03-22 DIAGNOSIS — I480 Paroxysmal atrial fibrillation: Secondary | ICD-10-CM

## 2024-03-22 DIAGNOSIS — R310 Gross hematuria: Secondary | ICD-10-CM

## 2024-03-22 DIAGNOSIS — I5032 Chronic diastolic (congestive) heart failure: Secondary | ICD-10-CM

## 2024-03-22 DIAGNOSIS — Z7984 Long term (current) use of oral hypoglycemic drugs: Secondary | ICD-10-CM

## 2024-03-22 DIAGNOSIS — R32 Unspecified urinary incontinence: Secondary | ICD-10-CM

## 2024-03-22 LAB — POCT GLYCOSYLATED HEMOGLOBIN (HGB A1C): Hemoglobin A1C: 8 % — AB (ref 4.0–5.6)

## 2024-03-22 MED ORDER — METFORMIN HCL ER 500 MG PO TB24: 500.0000 mg | ORAL_TABLET | Freq: Every day | ORAL | 3 refills | Status: DC

## 2024-03-22 NOTE — Patient Instructions (Addendum)
 We will request latest diabetic eye exam.  Stop Jardiance .  Restart metformin  XR 500mg  once daily. Send me sugar readings in a few weeks.  Consider Ozempic weekly shot.  Schedule virtual visit in 3 months  Return in 6 months for physical

## 2024-03-22 NOTE — Progress Notes (Unsigned)
 Ph: [phone] Fax: [phone]   Patient ID: William Hebert, male    DOB: 07-13-1946, 78 y.o.   MRN: [id number]  This visit was conducted in person.  BP 136/82   Pulse 82   Temp 98 F (36.7 C) (Oral)   Ht 5' 10.75 (1.797 m)   Wt (!) 324 lb (147 kg)   SpO2 94%   BMI 45.51 kg/m    CC: 6 mo f/u visit  Subjective:   HPI: William Hebert is a 78 y.o. male presenting on 03/22/2024 for Medical Management of Chronic Issues   Just saw dentist today.   Completed chest CT today - results pending. Has pulm f/u next 1 wk.   Ongoing dysuria and frequency. No urgency, gross hematuria, fevers/chills, flank pain, nausea/vomiting.  Latest UA abnormal with ongoing hematuria - UCx preliminarily growing GNR. Restarted keflex  course.  Urology appt scheduled for 05/23/2024 (Dr Alaine).  New bladder incontinence - using bladder incontinence diapers.   Exertional dyspnea - treated for COPD exacerbation with doxycycline  antibiotic + prednisone  taper - this is better. Requests promethazine  cough syrup refilled.   DM - does regularly check sugars, 95 fasting last week. Compliant with antihyperglycemic regimen which includes: jardiance  10mg  daily. Denies low sugars or hypoglycemic symptoms. Denies paresthesias, blurry vision. Last diabetic eye exam 11/2023 - records requested. Glucometer brand: accu-chek. Last foot exam: DUE. DSME: has not completed. Lab Results  Component Value Date   HGBA1C 8.0 (A) 03/22/2024   Diabetic Foot Exam - Simple   Simple Foot Form Diabetic Foot exam was performed with the following findings: Yes 03/22/2024  4:36 PM  Visual Inspection No deformities, no ulcerations, no other skin breakdown bilaterally: Yes Sensation Testing Intact to touch and monofilament testing bilaterally: Yes Pulse Check Posterior Tibialis and Dorsalis pulse intact bilaterally: Yes Comments No claudication + edema    Lab Results  Component Value Date   MICROALBUR 56.7 (H) 03/22/2024      Fall 2d ago - dog pulled him down, landed on knees.   Ex smoker - quit ~2007. Prior smoked 2 ppd. ~77 PY hx      Relevant past medical, surgical, family and social history reviewed and updated as indicated. Interim medical history since our last visit reviewed. Allergies and medications reviewed and updated. Outpatient Medications Prior to Visit  Medication Sig Dispense Refill   Accu-Chek Softclix Lancets lancets Check blood sugar once daily 100 each 1   acetaminophen  (TYLENOL ) 500 MG tablet Take 1,000 mg by mouth every 6 (six) hours as needed for moderate pain or mild pain.     apixaban (ELIQUIS) 5 MG TABS tablet Take 5 mg by mouth 2 (two) times daily.     Ascorbic Acid (VITAMIN C PO) Take 1,000 mg by mouth daily. Super C     aspirin  EC 81 MG tablet Take 1 tablet (81 mg total) by mouth daily. Day after surgery 30 tablet 1   B Complex-C (SUPER B COMPLEX PO) Take 1 tablet by mouth daily.     Blood Glucose Monitoring Suppl (ACCU-CHEK GUIDE) w/Device KIT Use as instructed to check blood sugar once daily. 1 kit 0   cephALEXin  (KEFLEX ) 500 MG capsule Take 1 capsule (500 mg total) by mouth 2 (two) times daily. 14 capsule 0   Cholecalciferol (VITAMIN D3) 1.25 MG (50000 UT) CAPS TAKE 1 CAPSULE BY MOUTH EVERY FRIDAY IN THE MORNING 12 capsule 0   furosemide  (LASIX ) 40 MG tablet Take 2 tablets (80 mg total) by  mouth daily. 180 tablet 2   glucose blood (ACCU-CHEK AVIVA PLUS) test strip Use to check blood sugar once daily 100 each 1   labetalol  (NORMODYNE ) 200 MG tablet Take 1 tablet (200 mg total) by mouth daily. 90 tablet 4   Multiple Vitamins-Minerals (MULTIVITAMIN ADULT PO) Take 1 tablet by mouth daily. MEN'S 50+ MULTIVITAMIN.     NIFEdipine  (PROCARDIA  XL/NIFEDICAL XL) 60 MG 24 hr tablet Take 1 tablet (60 mg total) by mouth daily. 90 tablet 4   omeprazole  (PRILOSEC) 40 MG capsule Take 1 capsule (40 mg total) by mouth daily. 90 capsule 4   potassium chloride  SA (KLOR-CON  M) 20 MEQ tablet Take 1  tablet (20 mEq total) by mouth daily. 90 tablet 4   rosuvastatin  (CRESTOR ) 20 MG tablet Take 1 tablet (20 mg total) by mouth daily. 90 tablet 4   valsartan  (DIOVAN ) 80 MG tablet Take 1 tablet (80 mg total) by mouth daily. 90 tablet 4   Zinc  100 MG TABS Take 1 tablet (100 mg total) by mouth daily. 90 tablet 3   empagliflozin  (JARDIANCE ) 10 MG TABS tablet Take 1 tablet (10 mg total) by mouth daily. 90 tablet 4   doxycycline  (VIBRA -TABS) 100 MG tablet Take 1 tablet (100 mg total) by mouth 2 (two) times daily. 14 tablet 0   HYDROcodone  bit-homatropine (HYCODAN) 5-1.5 MG/5ML syrup Take 5 mLs by mouth every 6 (six) hours as needed for cough. 120 mL 0   predniSONE  (DELTASONE ) 20 MG tablet Take 2 tablets (40 mg total) by mouth daily. 15 tablet 0   No facility-administered medications prior to visit.     Per HPI unless specifically indicated in ROS section below Review of Systems  Objective:  BP 136/82   Pulse 82   Temp 98 F (36.7 C) (Oral)   Ht 5' 10.75 (1.797 m)   Wt (!) 324 lb (147 kg)   SpO2 94%   BMI 45.51 kg/m   Wt Readings from Last 3 Encounters:  03/22/24 (!) 324 lb (147 kg)  03/02/24 (!) 314 lb (142.4 kg)  02/28/24 (!) 314 lb (142.4 kg)      Physical Exam Vitals and nursing note reviewed.  Constitutional:      Appearance: Normal appearance. He is not ill-appearing.  Eyes:     Extraocular Movements: Extraocular movements intact.     Conjunctiva/sclera: Conjunctivae normal.     Pupils: Pupils are equal, round, and reactive to light.  Cardiovascular:     Rate and Rhythm: Normal rate and regular rhythm.     Pulses: Normal pulses.     Heart sounds: Normal heart sounds. No murmur heard. Pulmonary:     Effort: Pulmonary effort is normal. No respiratory distress.     Breath sounds: Normal breath sounds. No wheezing, rhonchi or rales.  Musculoskeletal:     Right lower leg: Edema (1-2+) present.     Left lower leg: Edema (1-2+) present.     Comments: See HPI for foot exam if  done  Skin:    General: Skin is warm and dry.     Findings: No rash.  Neurological:     Mental Status: He is alert.  Psychiatric:        Mood and Affect: Mood normal.        Behavior: Behavior normal.       Results for orders placed or performed in visit on 03/22/24  Microalbumin / creatinine urine ratio   Collection Time: 03/22/24  4:16 PM  Result Value Ref  Range   Microalb, Ur 56.7 (H) 0.0 - 1.9 mg/dL   Creatinine,U 06.4 mg/dL   Microalb Creat Ratio 606.7 (H) 0.0 - 30.0 mg/g  Renal function panel   Collection Time: 03/22/24  4:16 PM  Result Value Ref Range   Sodium 140 135 - 145 mEq/L   Potassium 3.7 3.5 - 5.1 mEq/L   Chloride 106 96 - 112 mEq/L   CO2 23 19 - 32 mEq/L   Albumin  3.6 3.5 - 5.2 g/dL   BUN 22 6 - 23 mg/dL   Creatinine, Ser 8.47 (H) 0.40 - 1.50 mg/dL   Glucose, Bld 869 (H) 70 - 99 mg/dL   Phosphorus 3.7 2.3 - 4.6 mg/dL   GFR 55.74 (L) >39.99 mL/min   Calcium  8.4 8.4 - 10.5 mg/dL  POCT glycosylated hemoglobin (Hb A1C)   Collection Time: 03/22/24  4:44 PM  Result Value Ref Range   Hemoglobin A1C 8.0 (A) 4.0 - 5.6 %   HbA1c POC (<> result, manual entry)     HbA1c, POC (prediabetic range)     HbA1c, POC (controlled diabetic range)     Lab Results  Component Value Date   PSA 1.08 09/21/2023   PSA 0.96 07/20/2022   PSA 0.79 07/24/2020   Assessment & Plan:   Problem List Items Addressed This Visit     Type 2 diabetes mellitus with other specified complication (HCC) - Primary   Chronic, uncontrolled with A1c 8%. Only on jardiance  10mg  daily - will stop this medication given new hematuria/UTI and concern for recurrence.  Previous metformin  intolerance (diarrhea/GI upset).  Recommend [id number] - however he is strongly averse to all injections.  He prefers to restart metformin  - start XR 500mg  daily with option to increase to 1000mg  daily. Consider sulfonylurea.       Relevant Medications   metFORMIN  (GLUCOPHAGE -XR) 500 MG 24 hr tablet   Other Relevant  Orders   Microalbumin / creatinine urine ratio (Completed)   Renal function panel (Completed)   POCT glycosylated hemoglobin (Hb A1C) (Completed)   Diastolic dysfunction with chronic heart failure (HCC)   Weight gain noted, worsening pedal edema.  He is on nifedipine .  He is on lasix  40mg  2 tablets daily - with significant UOP. Recommend trying 40mg  BID, discussed dosing.  Appreciate cardiology care.       Paroxysmal atrial fibrillation (HCC)   Continues eliquis, nifedipine , followed by [facility] cardiologist.      Gross hematuria   Episode of gross hematuria treated with keflex  [place] [facility] [place] UCx not done at that time.  Due to recurrent dysuria, UA repeated - abnormal, repeat UCx pending. 2nd keflex  course prescribed. Also with newly noted urinary incontinence.  [place]t [place] 05/2024.      Relevant Orders   Urinalysis, Routine w reflex microscopic   UA/M w/rflx Culture, Routine   Urinary incontinence   Pending urology eval.         Meds ordered this encounter  Medications   metFORMIN  (GLUCOPHAGE -XR) 500 MG 24 hr tablet    Sig: Take 1 tablet (500 mg total) by mouth daily with breakfast.    Dispense:  90 tablet    Refill:  3    In place of Jardiance     Orders Placed This Encounter  Procedures   Microalbumin / creatinine urine ratio   Renal function panel   Urinalysis, Routine w reflex microscopic    Standing Status:   Future  Number of Occurrences:   1    Expiration Date:   03/22/2025   UA/M w/rflx Culture, Routine    Standing Status:   Future    Number of Occurrences:   1    Expiration Date:   03/22/2025   POCT glycosylated hemoglobin (Hb A1C)    Patient Instructions  We will request latest diabetic eye exam.  Stop Jardiance .  Restart metformin  XR 500mg  once daily. Send me sugar readings in a few weeks.  Consider Ozempic weekly shot.  Schedule virtual visit in 3 months  Return in 6 months for  physical  Follow up plan: Return in about 6 months (around 09/22/2024) for annual exam, prior fasting for blood work, medicare wellness visit.  Anton Blas, MD

## 2024-03-23 LAB — MICROALBUMIN / CREATININE URINE RATIO
Creatinine,U: 93.5 mg/dL
Microalb Creat Ratio: 606.7 mg/g — ABNORMAL HIGH (ref 0.0–30.0)
Microalb, Ur: 56.7 mg/dL — ABNORMAL HIGH (ref 0.0–1.9)

## 2024-03-23 LAB — RENAL FUNCTION PANEL
Albumin: 3.6 g/dL (ref 3.5–5.2)
BUN: 22 mg/dL (ref 6–23)
CO2: 23 meq/L (ref 19–32)
Calcium: 8.4 mg/dL (ref 8.4–10.5)
Chloride: 106 meq/L (ref 96–112)
Creatinine, Ser: 1.52 mg/dL — ABNORMAL HIGH (ref 0.40–1.50)
GFR: 44.25 mL/min — ABNORMAL LOW (ref >60.00)
Glucose, Bld: 130 mg/dL — ABNORMAL HIGH (ref 70–99)
Phosphorus: 3.7 mg/dL (ref 2.3–4.6)
Potassium: 3.7 meq/L (ref 3.5–5.1)
Sodium: 140 meq/L (ref 135–145)

## 2024-03-23 LAB — URINE CULTURE

## 2024-03-24 ENCOUNTER — Encounter: Payer: Self-pay | Admitting: Family Medicine

## 2024-03-24 ENCOUNTER — Ambulatory Visit: Payer: Self-pay | Admitting: Family Medicine

## 2024-03-24 DIAGNOSIS — N3001 Acute cystitis with hematuria: Secondary | ICD-10-CM

## 2024-03-24 DIAGNOSIS — R32 Unspecified urinary incontinence: Secondary | ICD-10-CM | POA: Insufficient documentation

## 2024-03-24 DIAGNOSIS — N390 Urinary tract infection, site not specified: Secondary | ICD-10-CM | POA: Insufficient documentation

## 2024-03-24 NOTE — Assessment & Plan Note (Signed)
 Continues eliquis, nifedipine , followed by [facility] cardiologist.

## 2024-03-24 NOTE — Assessment & Plan Note (Signed)
 Pending urology eval.

## 2024-03-24 NOTE — Assessment & Plan Note (Signed)
 Chronic, uncontrolled with A1c 8%. Only on jardiance  10mg  daily - will stop this medication given new hematuria/UTI and concern for recurrence.  Previous metformin  intolerance (diarrhea/GI upset).  Recommend [id number] - however he is strongly averse to all injections.  He prefers to restart metformin  - start XR 500mg  daily with option to increase to 1000mg  daily. Consider sulfonylurea.

## 2024-03-24 NOTE — Assessment & Plan Note (Addendum)
 Weight gain noted, worsening pedal edema.  He is on nifedipine .  He is on lasix  40mg  2 tablets daily - with significant UOP. Recommend trying 40mg  BID, discussed dosing.  Appreciate cardiology care.

## 2024-03-24 NOTE — Assessment & Plan Note (Signed)
 Episode of gross hematuria treated with keflex  antibiotic course [place] [facility] [place]. UCx not done at that time.  Due to recurrent dysuria, UA repeated - abnormal, repeat UCx pending. 2nd keflex  course prescribed. Also with newly noted urinary incontinence.  [place] - appt pending 05/2024.

## 2024-03-26 LAB — MICROSCOPIC EXAMINATION
Bacteria, UA: NONE SEEN
Casts: NONE SEEN /LPF

## 2024-03-26 LAB — UA/M W/RFLX CULTURE, ROUTINE
Bilirubin, UA: NEGATIVE
Ketones, UA: NEGATIVE
Nitrite, UA: NEGATIVE
RBC, UA: NEGATIVE
Specific Gravity, UA: 1.023 (ref 1.005–1.030)
Urobilinogen, Ur: 1 mg/dL (ref 0.2–1.0)
pH, UA: 5.5 (ref 5.0–7.5)

## 2024-03-26 LAB — URINE CULTURE, REFLEX: Organism ID, Bacteria: NO GROWTH

## 2024-03-28 ENCOUNTER — Encounter: Payer: Self-pay | Admitting: Acute Care

## 2024-03-28 ENCOUNTER — Telehealth (INDEPENDENT_AMBULATORY_CARE_PROVIDER_SITE_OTHER): Admitting: Acute Care

## 2024-03-28 DIAGNOSIS — R911 Solitary pulmonary nodule: Secondary | ICD-10-CM

## 2024-03-28 NOTE — Progress Notes (Signed)
 Virtual Visit via Video Note  I connected with William Hebert on 03/28/24 at  2:00 PM EDT by a video enabled telemedicine application and verified that I am speaking with the correct person using two identifiers.  Location: Patient: At home Provider:  [address], [place], [place], [address]    I discussed the limitations of evaluation and management by telemedicine and the availability of in person appointments. The patient expressed understanding and agreed to proceed.  Synopsis 78 yo M,former smoker with COPD,  PMH listed below. He was enrolled in our LDCT program and was found to have a RUL nodule. Subsolid lesion. Slowly changing over time. Last seen on ct in 2021. This CT shows stable image but it does appear more solid in nature in comparison. He was sent for PET  scan 03/2022  that showed no significant uptake. But this likely due to fact the lesion is so small. He has had follow up surveillance imaging done each year with the exception of 2024. He presents today to review the results of his latest surveillance Ct chest.  History of Present Illness: Pt. Presents for follow up after surveillance scan to re-evaluate lung nodules. He had the scan done while he was sick with a virus that has passed around his family.  He has a congested cough.  He is taking Robitussin DM for this while he is waiting for an narcotic cough syrup prescribed by his PCP to be sent through a home delivery service.  He denies any fever or worsening shortness of breath.  Sometimes the cough is difficult to recover from.  We have reviewed his most recent CT chest.  There are several areas that favor infectious or inflammatory changes.  There are several nodules that favor stable nodules that require continued surveillance. He does have a 9 mm right upper lobe nodule that may be large enough for pet imaging.  He also has some stable groundglass nodules that are 14 mm in size. As it has been 2 years since PET scan  has been done we will repeat the PET scan the next time patient and his wife are in [place] .  [place]. Patient wife is going to call the lung cancer screening office and let them know the date they will be in [place] so that we can place an order for PET scan at that time. Follow-up after that PET scan will be done virtually as again the patient does not live in the [place] region.  Patient denies any weight loss or hemoptysis.  He has recently been treated with doxycycline  for upper respiratory infection as well as Keflex  for UTI.  He is compliant with his Eliquis.  Both patient and his wife are in agreement with follow-up PET scan to be done in October when patient and his wife return to [facility]  where imaging can be done in [facility] facility.   Observations/Objective: CT Chest 03/22/2024 New subtle clustered right middle lobe ground-glass pulmonary nodules are favored infectious or inflammatory. Suggest attention on short-term interval follow-up chest CT in 2-3 months. 2. Stable left upper lobe 8 mm ground-glass pulmonary nodule with 4 mm solid component. Suggest continued surveillance. 3. Stable solid irregular right upper lobe pulmonary nodule measuring 9 mm. Suggest continued surveillance. 4. Stable ground-glass pulmonary nodules measuring up to 14 mm in the lingula. Suggest continued surveillance. 5. Progressive clustered nodularity in the dependent bilateral lower lobes with new subtle clustered  ground-glass nodularity in the right middle lobe measuring up to 6 mm. Findings are favored to reflect an infectious or inflammatory process. 6. Chronic right-sided hydronephrosis with stable hyperdense 14 mm lesion in the right upper pole kidney this is stable dating back to 2020 compatible with a benign finding. 7. Aortic atherosclerosis.    Assessment and Plan: Stable lung nodules New progressive nodularities  most likely related to infection or inflammation 14 mm groundglass nodule in the lingula Plan We have reviewed your CT scan. The nodules of we have been following may now be big enough for PET imaging Plan will be for PET scan at [place] once you let me know date you are in town.  Per your schedule, this will Most likely mid October 2025 We will do a follow up video visit after the scan has been read to review the results  Go to Urgent care or ED  if  needed for any worsening respiratory issues .  Robitussin DM as needed for cough. Please contact office for sooner follow up if symptoms do not improve or worsen or seek emergency care      Follow Up Instructions: PET scan 05/2024, will video visit follow up after to review results .   I discussed the assessment and treatment plan with the patient. The patient was provided an opportunity to ask questions and all were answered. The patient agreed with the plan and demonstrated an understanding of the instructions.   The patient was advised to call back or seek an in-person evaluation if the symptoms worsen or if the condition fails to improve as anticipated.  I spent 20 minutes dedicated to the care of this patient on the date of this encounter to include pre-visit review of records, video face-to-face time with the patient discussing conditions above, post visit ordering of testing, clinical documentation with the electronic health record, making appropriate referrals as documented, and communicating necessary information to the patient's healthcare team.   [name], NP

## 2024-03-28 NOTE — Patient Instructions (Addendum)
 It is good to see you today.  We have reviewed your CT scan. The nodules of we have been following may not be big enough for pet imaging PET scan at [facility] once you let me know date you are in town.  Most likely mid October.  We will do a follow up video visit after the scan has been read.  Go to Urgent care as needed.  Robitussin DM as needed for cough. Please contact office for sooner follow up if symptoms do not improve or worsen or seek emergency care

## 2024-03-31 ENCOUNTER — Ambulatory Visit: Payer: Self-pay

## 2024-03-31 NOTE — Telephone Encounter (Signed)
 FYI Only or Action Required?: Action required by provider: update on patient condition.  Patient was last seen in primary care on 03/22/2024 by Rilla Baller, MD.  Called Nurse Triage reporting Diarrhea.  Symptoms began several days ago.  Interventions attempted: OTC medications: Imodium.  Symptoms are: unchanged.  Triage Disposition: See Physician Within 24 Hours  Patient/caregiver understands and will follow disposition?: No, wishes to speak with PCP  Patient will go to urgent care over the weekend if symptoms worsen.      Copied from CRM [id number]. Topic: Clinical - Red Word Triage >> Mar 31, 2024  2:42 PM Harlene ORN wrote: Red Word that prompted transfer to Nurse Triage: Wife called about husband. Having side effects to medication. Was prescribed new form of metformin  that is giving him diarrhea.        Reason for Disposition  [1] MODERATE diarrhea (e.g., 4-6 times / day more than normal) AND [2] present > 48 hours (2 days)  Answer Assessment - Initial Assessment Questions Patient reports he is considering GLP1RA  injections as discussed during their last visit instead of Metformin  due to side effects. Patient also wanted to discuss a PET scan as by pulmonology. They would like a call back with any suggestions Dr. Rilla would like him to do or if he would want to see him in the office. Please advise.        1. DIARRHEA SEVERITY: How bad is the diarrhea? How many more stools have you had in the past 24 hours than normal?      6-8 times in the last 24 hours  2. ONSET: When did the diarrhea begin?      3-4 days ago  3. STOOL DESCRIPTION:  How loose or watery is the diarrhea? What is the stool color? Is there any blood or mucous in the stool?     Watery  4. VOMITING: Are you also vomiting? If Yes, ask: How many times in the past 24 hours?      No 5. ABDOMEN PAIN: Are you having any abdomen pain? If Yes, ask: What does it feel like? (e.g.,  crampy, dull, intermittent, constant)      No 6. ABDOMEN PAIN SEVERITY: If present, ask: How bad is the pain?  (e.g., Scale 1-10; mild, moderate, or severe)     0/10 7. ORAL INTAKE: If vomiting, Have you been able to drink liquids? How much liquids have you had in the past 24 hours?     N/A 8. HYDRATION: Any signs of dehydration? (e.g., dry mouth [not just dry lips], too weak to stand, dizziness, new weight loss) When did you last urinate?     N/A 9. EXPOSURE: Have you traveled to a foreign country recently? Have you been exposed to anyone with diarrhea? Could you have eaten any food that was spoiled?     No 10. ANTIBIOTIC USE: Are you taking antibiotics now or have you taken antibiotics in the past 2 months?       No 11. OTHER SYMPTOMS: Do you have any other symptoms? (e.g., fever, blood in stool)       Nausea yesterday but none today  Protocols used: Diarrhea-A-AH

## 2024-04-02 ENCOUNTER — Other Ambulatory Visit: Payer: Self-pay | Admitting: Family Medicine

## 2024-04-02 ENCOUNTER — Encounter: Payer: Self-pay | Admitting: Family Medicine

## 2024-04-02 DIAGNOSIS — R310 Gross hematuria: Secondary | ICD-10-CM

## 2024-04-02 MED ORDER — OZEMPIC (0.25 OR 0.5 MG/DOSE) 2 MG/3ML ~~LOC~~ SOPN
0.2500 mg | PEN_INJECTOR | SUBCUTANEOUS | 0 refills | Status: DC
Start: 2024-04-02 — End: 2024-05-12

## 2024-04-02 MED ORDER — OZEMPIC (0.25 OR 0.5 MG/DOSE) 2 MG/1.5ML ~~LOC~~ SOPN
0.5000 mg | PEN_INJECTOR | SUBCUTANEOUS | 4 refills | Status: AC
Start: 2024-04-30 — End: ?

## 2024-04-02 NOTE — Telephone Encounter (Signed)
 Replied via mychart.

## 2024-04-02 NOTE — Progress Notes (Signed)
 ERx ozempic  due to metformin  XR causing diarrhea

## 2024-04-07 ENCOUNTER — Telehealth: Payer: Self-pay | Admitting: Family Medicine

## 2024-04-07 ENCOUNTER — Encounter: Payer: Self-pay | Admitting: Family Medicine

## 2024-04-07 LAB — UA/M W/RFLX CULTURE, ROUTINE
Bilirubin, UA: NEGATIVE
Glucose, UA: NEGATIVE
Ketones, UA: NEGATIVE
Leukocytes,UA: NEGATIVE
Nitrite, UA: NEGATIVE
RBC, UA: NEGATIVE
Specific Gravity, UA: 1.018 (ref 1.005–1.030)
Urobilinogen, Ur: 1 mg/dL (ref 0.2–1.0)
pH, UA: 7 (ref 5.0–7.5)

## 2024-04-07 LAB — MICROSCOPIC EXAMINATION
Bacteria, UA: NONE SEEN
Casts: NONE SEEN /LPF
RBC, Urine: NONE SEEN /HPF (ref 0–2)

## 2024-04-07 NOTE — Telephone Encounter (Signed)
 Added from CRM for patient  Copied from CRM [id number]. Topic: Clinical - Medication Question >> Apr 07, 2024 12:32 PM Shereese L wrote: Reason for CRM: Sugar increasing as he takes Ozempic  10-20pt each time 160(08/04), 143, 183, 188 Patient has a heart condition so she doesn't want it to get any high [phone] Patient wife stated that she doesn't know if he can take the metformin  While being on the Ozempic  Also wants to go over urine test

## 2024-04-07 NOTE — Telephone Encounter (Signed)
 Added information to open my chart message from patient. Please see for further documentation.

## 2024-04-07 NOTE — Telephone Encounter (Signed)
 Copied from CRM [id number]. Topic: Clinical - Medication Question >> Apr 07, 2024 12:32 PM Shereese L wrote: Reason for CRM: Sugar increasing as he takes Ozempic  10-20pt each time 160(08/04), 143, 183, 188 Patient has a heart condition so she doesn't want it to get any high [phone] Patient wife stated that she doesn't know if he can take the metformin  While being on the Ozempic  Also wants to go over urine test

## 2024-04-10 ENCOUNTER — Ambulatory Visit: Payer: Self-pay | Admitting: Family Medicine

## 2024-04-10 DIAGNOSIS — R801 Persistent proteinuria, unspecified: Secondary | ICD-10-CM

## 2024-04-10 DIAGNOSIS — E1122 Type 2 diabetes mellitus with diabetic chronic kidney disease: Secondary | ICD-10-CM

## 2024-04-10 DIAGNOSIS — E1121 Type 2 diabetes mellitus with diabetic nephropathy: Secondary | ICD-10-CM

## 2024-04-12 ENCOUNTER — Telehealth: Payer: Self-pay

## 2024-04-12 DIAGNOSIS — R911 Solitary pulmonary nodule: Secondary | ICD-10-CM

## 2024-04-12 NOTE — Telephone Encounter (Signed)
 Copied from CRM [id number]. Topic: Clinical - Request for Lab/Test Order >> Apr 12, 2024  3:56 PM Russell PARAS wrote: Reason for CRM:   Pt's wife is contacting clinic due to being advised at his last visit to have a PET scan performed in 05/2024. Reviewed chart and there is no order placed.   Pt's wife is requesting order be placed, so they can go ahead and schedule.   They will be in town for one of the wife's appts on 8/18, and would like to schedule his PET scan either that afternoon or Tues late morning before they head back home.   Requests call back once order has been placed CB# [phone]   Per virtual visit 7/29 with SG PET scan 05/2024, will video visit follow up after to review results  I have placed order.  Called and spoke with patients wife (DPR) pt states Lauraine told them to let her know when pt was back in town and they could have PET completed then. Pts wife stated they will be in town next Monday 8/18 & Tuesday (8/19) and would like to see if OK with Lauraine to get PET then.  Please advise

## 2024-04-13 NOTE — Telephone Encounter (Signed)
 ATC x1 LVM for patient to call our office back regarding prior message . Will send patient a mychart message.

## 2024-04-13 NOTE — Telephone Encounter (Signed)
 Routing to front staff to schedule pt virtual visit with SG after PET has been completed.  PET scan is scheduled for 8/18.  Thank you!

## 2024-04-13 NOTE — Telephone Encounter (Signed)
Follow up has been scheduled.

## 2024-04-13 NOTE — Telephone Encounter (Signed)
 Spoke with patient's wife and confirmed PET scan appointment. Printed and mailed appointment reminder. Follow-up will be scheduled as needed.

## 2024-04-17 ENCOUNTER — Encounter (HOSPITAL_COMMUNITY): Admission: RE | Admit: 2024-04-17 | Source: Ambulatory Visit

## 2024-04-18 ENCOUNTER — Encounter (HOSPITAL_COMMUNITY)
Admission: RE | Admit: 2024-04-18 | Discharge: 2024-04-18 | Disposition: A | Discharge status: Home / Self Care | Source: Ambulatory Visit | Attending: Acute Care | Admitting: Acute Care

## 2024-04-18 DIAGNOSIS — R911 Solitary pulmonary nodule: Secondary | ICD-10-CM | POA: Diagnosis present

## 2024-04-18 LAB — GLUCOSE, CAPILLARY: Glucose-Capillary: 135 mg/dL — ABNORMAL HIGH (ref 70–99)

## 2024-04-18 MED ORDER — FLUDEOXYGLUCOSE F - 18 (FDG) INJECTION
15.7500 | Freq: Once | INTRAVENOUS | Status: AC
  Administered 2024-04-18: 15.56 via INTRAVENOUS

## 2024-05-12 ENCOUNTER — Encounter: Payer: Self-pay | Admitting: Acute Care

## 2024-05-12 ENCOUNTER — Telehealth (INDEPENDENT_AMBULATORY_CARE_PROVIDER_SITE_OTHER): Admitting: Acute Care

## 2024-05-12 VITALS — BP 141/90 | HR 69 | Ht 71.0 in | Wt 316.0 lb

## 2024-05-12 DIAGNOSIS — R911 Solitary pulmonary nodule: Secondary | ICD-10-CM

## 2024-05-12 DIAGNOSIS — Z87891 Personal history of nicotine dependence: Secondary | ICD-10-CM | POA: Diagnosis not present

## 2024-05-12 DIAGNOSIS — R9389 Abnormal findings on diagnostic imaging of other specified body structures: Secondary | ICD-10-CM

## 2024-05-12 DIAGNOSIS — J449 Chronic obstructive pulmonary disease, unspecified: Secondary | ICD-10-CM

## 2024-05-12 DIAGNOSIS — R942 Abnormal results of pulmonary function studies: Secondary | ICD-10-CM

## 2024-05-12 NOTE — Progress Notes (Signed)
 Virtual Visit via Video Note  I connected with William Hebert on 05/12/24 at 11:30 AM EDT by a video enabled telemedicine application and verified that I am speaking with the correct person using two identifiers.  Location: Patient:  At home Provider: [address] [place], [place], [address]    I discussed the limitations of evaluation and management by telemedicine and the availability of in person appointments. The patient expressed understanding and agreed to proceed.  Pt. Has consented to use of [employer] soft wear to help capture the content of this OV    History of Present Illness: 78 yo M,former smoker with COPD,  PMH listed below. He was enrolled in our LDCT program and was found to have a RUL nodule. Subsolid lesion. Slowly changing over time. Last seen on ct in 2021. This CT shows stable image but it does appear more solid in nature in comparison. He was sent for PET  scan 03/2022  that showed no significant uptake. Most recent CT chest showed the nodule had grown to 9 mm, and PET scan done 03/2024 shows a low uptake of 2.9. Previous PET scan 03/2022 showed an SUV of 1.3. The nodule has grown 1 mm , and become more solid in 1.5 years, and on PET is is more hypermetabolic, but still low level. He is following up today to discuss next best steps in plan of care.  We discussed that the nodule has increased uptake from his previous PET scan. We discussed bronchoscopy with biopsy. Patient is on Eliquis for a fib and will need clearance for this to be stopped x 48 hours.We reviewed the risks to include bleeding, infection, pneumothorax and adverse reaction to anesthesia. Pt. Would prefer to do a 3 month follow up CT chest to monitor for stability vs committing to biopsy now. His wife will call me if he changes his mind. I did tell him I felt we needed to biopsy the nodule in the future.   He is otherwise at his baseline for his COPD.     Observations/Objective: Test Results: PET Scan  04/18/2024  9 mm posterior right upper lobe pulmonary nodule shows low level FDG uptake with SUV max = 2.9. This is indeterminate and could be infectious/inflammatory or neoplastic. 2. No discernible hypermetabolism in the remaining scattered tiny solid and ground-glass pulmonary nodules. These are small and likely below threshold for reliable resolution on PET imaging. Well differentiated or low-grade neoplasm can be poorly FDG avid. Close continued follow-up warranted. 3. No hypermetabolic lymphadenopathy in the chest. 4. No evidence for hypermetabolic metastatic disease in the abdomen or pelvis. 5. 13 mm hyper attenuating lesion in the upper pole right kidney is stable since lung cancer screening CT of 03/19/2022. No discernible hypermetabolism in this lesion. This is likely a cyst complicated by proteinaceous debris or hemorrhage. 6.  Aortic Atherosclerosis (ICD10-I70.0).      Assessment and Plan: Right Upper Lobe pulmonary Nodule with low uptake on PET scan COPD Former smoker  Plan Your PET scan shows the right upper lobe nodule of concern has a low SUV uptake on pet imaging. The SUV uptake onset most recent PET scan was 2.9. The PET scan done in August 2023 showed this right upper lobe nodule had an uptake of 1.3 at the time. While the nodule is stable in regard to its size it does have an increased metabolic activity on pet imaging over the last year and a half. We discussed the option of bronchoscopy with  biopsy now versus waiting 3 months with a CT scan to continue monitoring the nodule concern. You prefer to wait 3 months and reevaluate the nodule. I do feel we will end up biopsying this nodule at the 75-month follow-up therefore if you change your mind please give me a call so we can get it scheduled sooner. Call for any significant weight loss or blood in your sputum. The 78-month CT scan has been ordered for [name] as you will be in [place] in December  when it is due. Please call if you need a sooner. Please contact office for sooner follow up if symptoms do not improve or worsen or seek emergency care    Follow Up Instructions: Follow up virtual visit 1-2 weeks after 3 month follow up[ Ct Chest( Due 07/2024)     I discussed the assessment and treatment plan with the patient. The patient was provided an opportunity to ask questions and all were answered. The patient agreed with the plan and demonstrated an understanding of the instructions.   The patient was advised to call back or seek an in-person evaluation if the symptoms worsen or if the condition fails to improve as anticipated.  I provided 20 minutes of video face-to-face time during this encounter.   William JULIANNA Lites, NP

## 2024-05-12 NOTE — Patient Instructions (Addendum)
 It was good to see you today. Your PET scan shows the right upper lobe nodule of concern has a low SUV uptake on pet imaging. The SUV uptake onset most recent PET scan was 2.9. The PET scan done in August 2023 showed this right upper lobe nodule had an uptake of 1.3 at the time. While the nodule is stable in regard to its size it does have an increased metabolic activity on pet imaging over the last year and a half. We discussed the option of bronchoscopy with biopsy now versus waiting 3 months with a CT scan to continue monitoring the nodule concern. You prefer to wait 3 months and reevaluate the nodule. I do feel we will end up biopsying this nodule at the 55-month follow-up therefore if you change your mind please give me a call so we can get it scheduled sooner. Call for any significant weight loss or blood in your sputum. The 1-month CT scan has been ordered for [facility] regional hospital as you will be in [place] in December when it is due. Please call if you need a sooner. Please contact office for sooner follow up if symptoms do not improve or worsen or seek emergency care

## 2024-05-21 ENCOUNTER — Other Ambulatory Visit: Payer: Self-pay | Admitting: Family Medicine

## 2024-05-21 DIAGNOSIS — E559 Vitamin D deficiency, unspecified: Secondary | ICD-10-CM

## 2024-05-22 NOTE — Telephone Encounter (Signed)
 E-scribed refill.  Per 03/22/24, plz schedule VV for 3 mo f/u.   Also, plz schedule annual exam after 09/28/24.

## 2024-05-27 NOTE — Telephone Encounter (Signed)
 New nephrology referral placed for persistent proteinuria

## 2024-05-27 NOTE — Addendum Note (Signed)
 Addended by: RILLA BALLER on: 05/27/2024 11:45 AM   Modules accepted: Orders

## 2024-07-04 ENCOUNTER — Other Ambulatory Visit: Payer: Self-pay | Admitting: Family Medicine

## 2024-07-10 ENCOUNTER — Other Ambulatory Visit: Payer: Self-pay | Admitting: Family Medicine

## 2024-07-10 DIAGNOSIS — I10 Essential (primary) hypertension: Secondary | ICD-10-CM

## 2024-07-24 ENCOUNTER — Encounter: Payer: Self-pay | Admitting: *Deleted

## 2024-08-04 ENCOUNTER — Ambulatory Visit
Admission: RE | Admit: 2024-08-04 | Discharge: 2024-08-04 | Disposition: A | Source: Ambulatory Visit | Attending: Acute Care | Admitting: Acute Care

## 2024-08-04 DIAGNOSIS — R911 Solitary pulmonary nodule: Secondary | ICD-10-CM

## 2024-08-11 ENCOUNTER — Telehealth: Payer: Self-pay

## 2024-08-11 NOTE — Telephone Encounter (Signed)
 This was a one time prescription from summer 2025.  He is no longer on this medication.

## 2024-08-11 NOTE — Telephone Encounter (Signed)
 Copied from CRM #[id number]. Topic: Clinical - Medication Question >> Aug 11, 2024 11:55 AM Winona SAUNDERS wrote: [name] calling from [employer] requesting provider review medications below As they may have side effects for pts withing this age group. promethazine -dextromethorphan (PROMETHAZINE -DM) 6.25-15 MG/5ML syrup,

## 2024-09-14 ENCOUNTER — Telehealth: Payer: Self-pay

## 2024-09-14 NOTE — Telephone Encounter (Signed)
 Copied from CRM #[id number]. Topic: Clinical - Lab/Test Results >> Sep 14, 2024  1:19 PM Benton O wrote: Reason for CRM: patient wife ms darlene is calling because patient took a ct scan on December 5th and never heard anything else about it they are needing those results . Please reach out to patient wife ms darlene to give those results  [phone]  Called wife and got patient scheduled for video visit on  jan 27th.NFN

## 2024-09-19 ENCOUNTER — Other Ambulatory Visit: Payer: Self-pay | Admitting: Family Medicine

## 2024-09-19 DIAGNOSIS — K219 Gastro-esophageal reflux disease without esophagitis: Secondary | ICD-10-CM

## 2024-09-19 DIAGNOSIS — I10 Essential (primary) hypertension: Secondary | ICD-10-CM

## 2024-09-26 ENCOUNTER — Encounter: Payer: Self-pay | Admitting: Acute Care

## 2024-09-26 ENCOUNTER — Ambulatory Visit: Admitting: Acute Care

## 2024-09-26 VITALS — BP 152/83 | HR 79 | Ht 70.0 in | Wt 316.0 lb

## 2024-09-26 DIAGNOSIS — R911 Solitary pulmonary nodule: Secondary | ICD-10-CM

## 2024-09-26 DIAGNOSIS — J479 Bronchiectasis, uncomplicated: Secondary | ICD-10-CM

## 2024-09-26 DIAGNOSIS — R918 Other nonspecific abnormal finding of lung field: Secondary | ICD-10-CM

## 2024-09-26 DIAGNOSIS — R1909 Other intra-abdominal and pelvic swelling, mass and lump: Secondary | ICD-10-CM

## 2024-09-26 DIAGNOSIS — R942 Abnormal results of pulmonary function studies: Secondary | ICD-10-CM

## 2024-09-26 DIAGNOSIS — R9389 Abnormal findings on diagnostic imaging of other specified body structures: Secondary | ICD-10-CM

## 2024-09-26 DIAGNOSIS — D3502 Benign neoplasm of left adrenal gland: Secondary | ICD-10-CM

## 2024-09-26 DIAGNOSIS — Z87891 Personal history of nicotine dependence: Secondary | ICD-10-CM

## 2024-09-26 DIAGNOSIS — J449 Chronic obstructive pulmonary disease, unspecified: Secondary | ICD-10-CM

## 2024-09-26 NOTE — Patient Instructions (Addendum)
 It is good to see you today. Your CT chest shows stable lung nodules. We will do a 3 month follow up scan to follow the lung nodules. This will be due in March 2026. You will get a call to get this scheduled closer to the time.  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 We discussed this and you are going to follow up with your renal surgeon where you live. Recommendation by radiology is for a repeat PET scan to further evaluate. Please discuss this with your renal physician / surgeon in [facility]. It will be easier for them to work this up closer to home for you all.  I will send a copy of the CT Chest report to your address.  I will see you after the March of 2026 scan.  We will have you come by the office when you are in town next week to get a flutter valve.  Use this several times daily , 5-6 blows at a time.  Use with Mucinex, plain, 1200 mg in the morning, take with a full glass of water. Use the Mucinex and flutter valve together to help mobilize secretions. Follow up in 3 months after the CT Chest. Please let me know what the renal surgeon says about the adrenal mass. Please contact office for sooner follow up if symptoms do not improve or worsen or seek emergency care

## 2024-09-26 NOTE — Progress Notes (Signed)
 Virtual Visit via Video Note  I connected with William Hebert on 09/26/24 at  3:30 PM EST by a video enabled telemedicine application and verified that I am speaking with the correct person using two identifiers.  Location: Patient:  At home in [facility] Provider:  [address] [place], [place], [address]    I discussed the limitations of evaluation and management by telemedicine and the availability of in person appointments. The patient expressed understanding and agreed to proceed.  Synopsis 79 yo M,former smoker with COPD,  PMH listed below. He was enrolled in our LDCT program and was found to have a RUL nodule. Subsolid lesion. Slowly changing over time. Last seen on ct in 2021. This CT shows stable image but it does appear more solid in nature in comparison. He was sent for PET  scan 03/2022  that showed no significant uptake. Most recent CT chest showed the nodule had grown to 9 mm, and PET scan done 03/2024 shows a low uptake of 2.9. Previous PET scan 03/2022 showed an SUV of 1.3. The nodule has grown 1 mm , and become more solid in 1.5 years, and on PET is is more hypermetabolic, but still low level. He is following up today to discuss next best steps in plan of care.  We discussed that the nodule has increased uptake from his previous PET scan. We discussed bronchoscopy with biopsy. Patient is on Eliquis for a fib and will need clearance for this to be stopped x 48 hours.We reviewed the risks to include bleeding, infection, pneumothorax and adverse reaction to anesthesia. Pt. Would prefer to do a 3 month follow up CT chest to monitor for stability vs committing to biopsy now. His wife will call me if he changes his mind. I did tell him I felt we needed to biopsy the nodule in the future.    He is otherwise at his baseline for his COPD.      History of Present Illness: Pt. Presents for  3 month follow up to review surveillance CT Chest.  He has been doing well. He does have a chronic cough.    We have reviewed the results of his most recent CT imaging.  CT chest done 08/04/2024 shows multiple bilateral pulmonary nodules without significant change since prior exam.  As noted by radiology these have demonstrated intermediate characteristics on previous PET scans and will continue to require close follow-up.  I have discussed this with William Hebert at his previous appointment that I suspected he was given a to have these pulmonary nodules biopsy.  Additional finding on this CT chest that was done on previous chest CT was an enlarging left adrenal mass that now measures 2.6 x 4.3 cm and is concerning for an underlying neoplasm.  Radiology recommended repeat PET scan.  Patient and his wife live in [place] .  Apparently he had developed some hematuria and was was referred to a renal surgeon recently.  This renal surgeon has been following him , has done additional imaging, and has recently released him back to nephrology.  I explained that I thought it best that they receive care regarding this finding closer to their home in [place], especially since they are established with a renal surgeon.  I have sent a copy of the CT chest report to their home address so that they can share this with their surgeon.  I have asked that they let me know plan for follow-up of the left  adrenal mass.  As pulmonary nodule is currently stable we will reimage that in 3 months to reevaluate for need for bronchoscopy and biopsy.  Patient denies any weight loss or hemoptysis.  CT chest did show bronchiectasis.  We will start Mucinex 1200 mg daily with a full glass of water and flutter valve 4-6 times daily, 5-6 blows at a time.  I explained that this should help thin the mucus and then mobilize the mucus so he can cough it up.  We can reevaluate bronchiectasis on the 76-month follow-up CT that we will also be establishing follow-up of the pulmonary nodule.  Patient and his wife are in agreement with  this plan.  Patient's wife stated that she was going to call the renal surgeon today to make sure he is aware that the patient needs to have follow-up on the large left adrenal mass.  I have asked her to call me at the office if she has any issues or problems with follow-up so we can make arrangements to ensure that this is evaluated fully.   Observations/Objective: CT chest without contrast 08/04/2024 Multiple bilateral pulmonary nodules, without significant change since prior exam. These have demonstrated indeterminate characteristics on previous PET scan, and close continued follow-up is recommended. 2. Enlarging left adrenal mass, now measuring 2.6 x 4.3 cm, concerning for underlying neoplasm. Repeat PET scan may be useful. 3. Persistent bibasilar bronchiectasis and punctate nodularity, likely sequela of chronic infection or aspiration. 4. Aortic Atherosclerosis (ICD10-I70.0). Coronary artery atherosclerosis.  Assessment and Plan: Stable bilateral pulmonary nodules Former smoker Plan - 78-month follow-up CT chest to reevaluate pulmonary nodules - Call to be seen sooner for unexplained weight loss or hemoptysis - I explained I suspect that after the next surveillance imaging we will need to consider biopsy of the pulmonary nodules.   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 CT report results printed and sent to patient home so she can share them with renal surgeon in [place] Patient wife understands she needs to give me a call if she has any issues getting follow-up on this underlying and concerning that left adrenal mass  Bronchiectasis noted on CT chest Plan Start Mucinex 1200 mg once daily, please take with a full glass of water Please come by and pick up a flutter valve at the office when you are in town next  week. Use flutter valve 5-6 times a day, 4-6 blows at a time to help with nasal airway clearance Follow-up CT chest in 3 months to evaluate effectiveness of mucolytic and airway mobilization If ineffective we will consider chest vest    Follow Up Instructions: Follow-up in 3 months after surveillance CT scan Call to be seen sooner for unexplained weight loss or hemoptysis Please let me know plan for follow-up of the left adrenal mass.   I discussed the assessment and treatment plan with the patient. The patient was provided an opportunity to ask questions and all were answered. The patient agreed with the plan and demonstrated an understanding of the instructions.   The patient was advised to call back or seek an in-person evaluation if the symptoms worsen or if the condition fails to improve as anticipated.  I spent 35 minutes dedicated to the care of this patient on the date of this encounter to include pre-visit review of records, video face-to-face time with the patient  and his wife discussing conditions above, post visit ordering of testing, clinical documentation with the electronic health record, making appropriate referrals as documented, and communicating necessary information to the patient's healthcare team.   [name], NP

## 2024-09-28 ENCOUNTER — Ambulatory Visit: Payer: Medicare Other

## 2024-09-29 ENCOUNTER — Other Ambulatory Visit: Payer: Self-pay | Admitting: Family Medicine

## 2024-09-29 ENCOUNTER — Ambulatory Visit

## 2024-09-29 VITALS — BP 152/80 | HR 67 | Ht 70.0 in | Wt 316.0 lb

## 2024-09-29 DIAGNOSIS — Z0000 Encounter for general adult medical examination without abnormal findings: Secondary | ICD-10-CM | POA: Diagnosis not present

## 2024-09-29 DIAGNOSIS — I10 Essential (primary) hypertension: Secondary | ICD-10-CM

## 2024-09-29 NOTE — Patient Instructions (Addendum)
 Mr. Minchey,  Thank you for taking the time for your Medicare Wellness Visit. I appreciate your continued commitment to your health goals. Please review the care plan we discussed, and feel free to reach out if I can assist you further.  Please note that Annual Wellness Visits do not include a physical exam. Some assessments may be limited, especially if the visit was conducted virtually. If needed, we may recommend an in-person follow-up with your provider.  Ongoing Care Seeing your primary care provider every 3 to 6 months helps us  monitor your health and provide consistent, personalized care.  Referrals If a referral was made during today's visit and you haven't received any updates within two weeks, please contact the referred provider directly to check on the status.  Recommended Screenings:  Health Maintenance  Topic Date Due   Zoster (Shingles) Vaccine (1 of 2) Never done   Colon Cancer Screening  02/23/2022   Eye exam for diabetics  12/03/2023   DTaP/Tdap/Td vaccine (2 - Tdap) 03/06/2024   Kidney health urinalysis for diabetes  09/22/2024   Hemoglobin A1C  09/22/2024   COVID-19 Vaccine (3 - 2025-26 season) 10/12/2024*   Flu Shot  11/28/2024*   Yearly kidney function blood test for diabetes  03/22/2025   Complete foot exam   03/22/2025   Screening for Lung Cancer  08/04/2025   Medicare Annual Wellness Visit  09/29/2025   Pneumococcal Vaccine for age over 75  Completed   Hepatitis C Screening  Completed   Meningitis B Vaccine  Aged Out  *Topic was postponed. The date shown is not the original due date.       09/29/2024    1:06 PM  Advanced Directives  Does Patient Have a Medical Advance Directive? Yes  Type of Estate Agent of [place];Living will  Does patient want to make changes to medical advance directive? Yes (Inpatient - patient requests chaplain consult to change a medical advance directive)  Copy of Healthcare Power of Attorney in Chart? No -  copy requested    Vision: Annual vision screenings are recommended for early detection of glaucoma, cataracts, and diabetic retinopathy. These exams can also reveal signs of chronic conditions such as diabetes and high blood pressure.  Dental: Annual dental screenings help detect early signs of oral cancer, gum disease, and other conditions linked to overall health, including heart disease and diabetes.

## 2024-09-29 NOTE — Progress Notes (Addendum)
 "  Chief Complaint  Patient presents with   Medicare Wellness     Subjective:   William Hebert is a 79 y.o. male who presents for a Medicare Annual Wellness Visit.  Visit info / Clinical Intake: Medicare Wellness Visit Type:: Subsequent Annual Wellness Visit Persons participating in visit and providing information:: patient Medicare Wellness Visit Mode:: Video Since this visit was completed virtually, some vitals may be partially provided or unavailable. Missing vitals are due to the limitations of the virtual format.: Documented vitals are patient reported If Telephone or Video please confirm:: I connected with patient using audio/video enable telemedicine. I verified patient identity with two identifiers, discussed telehealth limitations, and patient agreed to proceed. Patient Location:: Home Provider Location:: Office Interpreter Needed?: No Pre-visit prep was completed: yes AWV questionnaire completed by patient prior to visit?: yes Date:: 09/29/24 Living arrangements:: lives with spouse/significant other Patient's Overall Health Status Rating: good Typical amount of pain: some Does pain affect daily life?: no Are you currently prescribed opioids?: no  Dietary Habits and Nutritional Risks How many meals a day?: 2 Eats fruit and vegetables daily?: (!) no Most meals are obtained by: preparing own meals In the last 2 weeks, have you had any of the following?: none Diabetic:: (!) yes Any non-healing wounds?: no How often do you check your BS?: 1; as needed Would you like to be referred to a Nutritionist or for Diabetic Management? : no  Functional Status Activities of Daily Living (to include ambulation/medication): Independent Ambulation: Independent with device- listed below Home Assistive Devices/Equipment: CPAP; Eyeglasses Medication Administration: Dependent Is this a change from baseline?: Change from baseline, expected to last >3 days (Spouse helps) Home Management  (perform basic housework or laundry): Dependent (Spouse helps) Manage your own finances?: (!) no (Spouse helps) Primary transportation is: driving Concerns about vision?: no *vision screening is required for WTM* Concerns about hearing?: no  Fall Screening Falls in the past year?: 0 Number of falls in past year: 0 Was there an injury with Fall?: 0 Fall Risk Category Calculator: 0 Patient Fall Risk Level: Low Fall Risk  Fall Risk Patient at Risk for Falls Due to: No Fall Risks Fall risk Follow up: Falls evaluation completed; Falls prevention discussed  Home and Transportation Safety: All rugs have non-skid backing?: yes All stairs or steps have railings?: yes (outside) Grab bars in the bathtub or shower?: yes Have non-skid surface in bathtub or shower?: (!) no Good home lighting?: yes Regular seat belt use?: yes Hospital stays in the last year:: (!) yes How many hospital stays:: 3 Reason: UTI  Cognitive Assessment Difficulty concentrating, remembering, or making decisions? : no (Spouse helps) Will 6CIT or Mini Cog be Completed: yes What year is it?: 0 points What month is it?: 0 points Give patient an address phrase to remember (5 components): [address] [place] About what time is it?: 0 points Count backwards from 20 to 1: 0 points Say the months of the year in reverse: 0 points Repeat the address phrase from earlier: 0 points 6 CIT Score: 0 points  Advance Directives (For Healthcare) Does Patient Have a Medical Advance Directive?: Yes Does patient want to make changes to medical advance directive?: Yes (Inpatient - patient requests chaplain consult to change a medical advance directive) Type of Advance Directive: Healthcare Power of [place]; Living will Copy of Healthcare Power of Attorney in Chart?: No - copy requested Copy of Living Will in Chart?: No - copy requested  Reviewed/Updated  Reviewed/Updated:  Reviewed All (Medical, Surgical, Family,  Medications, Allergies, Care Teams, Patient Goals)    Allergies (verified) Multaq [dronedarone]   Current Medications (verified) Outpatient Encounter Medications as of 09/29/2024  Medication Sig   Accu-Chek Softclix Lancets lancets Check blood sugar once daily   acetaminophen  (TYLENOL ) 500 MG tablet Take 1,000 mg by mouth every 6 (six) hours as needed for moderate pain or mild pain.   apixaban (ELIQUIS) 5 MG TABS tablet Take 5 mg by mouth 2 (two) times daily.   Ascorbic Acid (VITAMIN C PO) Take 1,000 mg by mouth daily. Super C   aspirin  EC 81 MG tablet Take 1 tablet (81 mg total) by mouth daily. Day after surgery   B Complex-C (SUPER B COMPLEX PO) Take 1 tablet by mouth daily.   Blood Glucose Monitoring Suppl (ACCU-CHEK GUIDE) w/Device KIT Use as instructed to check blood sugar once daily.   Cholecalciferol (VITAMIN D3) 1.25 MG (50000 UT) CAPS TAKE 1 CAPSULE BY MOUTH EVERY FRIDAY IN THE MORNING   CRANBERRY PO Take 1,000 mg by mouth.   furosemide  (LASIX ) 40 MG tablet TAKE 2 TABLETS BY MOUTH DAILY   glucose blood (ACCU-CHEK AVIVA PLUS) test strip Use to check blood sugar once daily   ibuprofen  (ADVIL ) 800 MG tablet TAKE 1 TABLET BY MOUTH 3 TIMES A DAY AS NEEDED WITH MEALS   labetalol  (NORMODYNE ) 200 MG tablet Take 1 tablet (200 mg total) by mouth daily.   Multiple Vitamins-Minerals (MULTIVITAMIN ADULT PO) Take 1 tablet by mouth daily. MEN'S 50+ MULTIVITAMIN.   neomycin-polymyxin b -dexamethasone  (MAXITROL) 3.5-10000-0.1 SUSP APPLY 1 DROP INTO RIGHT EYE FOUR TIMES A DAY SHAKE WELL BEFORE EACH USE   NIFEdipine  (PROCARDIA  XL/NIFEDICAL XL) 60 MG 24 hr tablet TAKE 1 TABLET BY MOUTH DAILY   omeprazole  (PRILOSEC) 40 MG capsule TAKE 1 CAPSULE BY MOUTH DAILY   potassium chloride  SA (KLOR-CON  M) 20 MEQ tablet TAKE 1 TABLET BY MOUTH DAILY   promethazine -dextromethorphan (PROMETHAZINE -DM) 6.25-15 MG/5ML syrup TAKE 5 ML BY MOUTH 3 TIMES DAILY AS NEEDED FOR COUGH (SEDATION  PRECAUTIONS)   rosuvastatin   (CRESTOR ) 20 MG tablet Take 1 tablet (20 mg total) by mouth daily.   Semaglutide ,0.25 or 0.5MG /DOS, (OZEMPIC , 0.25 OR 0.5 MG/DOSE,) 2 MG/1.5ML SOPN Inject 0.5 mg into the skin once a week.   valsartan  (DIOVAN ) 80 MG tablet TAKE 1 TABLET BY MOUTH DAILY   Zinc  100 MG TABS Take 1 tablet (100 mg total) by mouth daily.   No facility-administered encounter medications on file as of 09/29/2024.    History: Past Medical History:  Diagnosis Date   AAA (abdominal aortic aneurysm) without rupture 07/10/2014   S/p endovascular repair 2012 with stent Soila), yearly f/u   Arthritis    Cancer [facility])    skin cancer non melanioma   Chronic bronchitis (HCC) 11/2016 AND 10-2016   Community acquired pneumonia 09/26/2023   COPD (chronic obstructive pulmonary disease) (HCC)    chronic bronchitis   GERD (gastroesophageal reflux disease)    Headache(784.0)    in 20's   Hepatitis    Carrier cant give blood gives a false positive   Hernia of abdominal wall    LARGE FROM BREAST BONE AREA TO BELLY BUTTON   History of irregular heartbeat    History of skin cancer 2014   HTN (hypertension)    Hyperlipidemia    Hypogonadism in male    prior on testosterone  cream   Knee pain    Left   Morbidly obese (HCC)    Peripheral  edema    feet   Pneumonia    PONV (postoperative nausea and vomiting)    PONV AFTER AAA REPAIR, ALL OTHER SURGERIES WENT OK   Posterior vitreous detachment 02/2018   Shortness of breath    WITH EXERTION   Sleep apnea    CPAP    Past Surgical History:  Procedure Laterality Date   ABDOMINAL AORTIC ANEURYSM REPAIR  05/20/2011   enodvascular aorto bifem. stent graft Soila)   CATARACT EXTRACTION Bilateral 2014   X 2 BOTH EYES DONE TWICE   COLONOSCOPY  12/27/2023   12 polyps, 11 precancerous, planned repeat in 1 year ([facility] [place])   COLONOSCOPY WITH PROPOFOL  N/A 02/23/2017   4 TA polyps, rpt 3-5 yrs ([facility])   ELBOW SURGERY  2012   FOOT TENDON SURGERY  Right    KNEE SURGERY Right remote   arthroscopy x2   MASS EXCISION N/A 05/10/2013   Procedure: EXCISION back MASS - cyst Cathy A. Cornett, MD)   SHOULDER ARTHROSCOPY WITH ROTATOR CUFF REPAIR AND SUBACROMIAL DECOMPRESSION Left 02/21/2021   Procedure: MINI OPEN  ROTATOR CUFF REPAIR AND SUBACROMIAL DECOMPRESSION;  Surgeon: Duwayne Purchase, MD;  Location: [facility];  Service: Orthopedics;  Laterality: Left;   SHOULDER OPEN ROTATOR CUFF REPAIR Right 07/08/2017   Procedure: Right shoulder mini open rotator cuff repair;  Surgeon: Duwayne Purchase, MD;  Location: [facility];  Service: Orthopedics;  Laterality: Right;  90 mins; Interscalene Block   SKIN SURGERY     2 different surgeries face    UMBILICAL HERNIA REPAIR  2002   Family History  Problem Relation Age of Onset   Cancer Mother 56       throat (nonsmoker)   Heart disease Maternal Aunt    Alcohol abuse Father        deceased 77yo   Social History   Occupational History   Occupation: academic librarian.    Occupation: works 6 months a year  Tobacco Use   Smoking status: Former    Current packs/day: 0.00    Average packs/day: 1.5 packs/day for 51.0 years (76.5 ttl pk-yrs)    Types: Cigarettes    Start date: 41    Quit date: 2013    Years since quitting: 13.0   Smokeless tobacco: Former    Types: Snuff, Chew    Quit date: 07/06/2006  Vaping Use   Vaping status: Never Used  Substance and Sexual Activity   Alcohol use: Not Currently   Drug use: No   Sexual activity: Not Currently    Partners: Female   Tobacco Counseling Counseling given: Yes  SDOH Screenings   Food Insecurity: No Food Insecurity (09/29/2024)  Housing: Unknown (09/29/2024)  Transportation Needs: No Transportation Needs (09/29/2024)  Utilities: Not At Risk (09/29/2024)  Alcohol Screen: Low Risk (09/28/2023)  Depression (PHQ2-9): Low Risk (09/29/2024)  Financial Resource Strain: Low Risk (09/28/2023)  Physical Activity: Inactive (09/29/2024)  Social Connections:  Moderately Isolated (09/29/2024)  Stress: No Stress Concern Present (09/29/2024)  Tobacco Use: Medium Risk (09/29/2024)  Health Literacy: Inadequate Health Literacy (09/29/2024)   See flowsheets for full screening details  Depression Screen PHQ 2 & 9 Depression Scale- Over the past 2 weeks, how often have you been bothered by any of the following problems? Little interest or pleasure in doing things: 0 Feeling down, depressed, or hopeless (PHQ Adolescent also includes...irritable): 0 PHQ-2 Total Score: 0 Trouble falling or staying asleep, or sleeping too much: 0 Feeling tired or having little energy: 0  Poor appetite or overeating (PHQ Adolescent also includes...weight loss): 0 Feeling bad about yourself - or that you are a failure or have let yourself or your family down: 0 Trouble concentrating on things, such as reading the newspaper or watching television (PHQ Adolescent also includes...like school work): 0 Moving or speaking so slowly that other people could have noticed. Or the opposite - being so fidgety or restless that you have been moving around a lot more than usual: 0 Thoughts that you would be better off dead, or of hurting yourself in some way: 0 PHQ-9 Total Score: 0 If you checked off any problems, how difficult have these problems made it for you to do your work, take care of things at home, or get along with other people?: Not difficult at all  Depression Treatment Depression Interventions/Treatment : EYV7-0 Score <4 Follow-up Not Indicated     Goals Addressed               This Visit's Progress     Patient Stated (pt-stated)        Patient stated he plans to continue taking meds daily and managing sugar intake             Objective:    Today's Vitals   09/29/24 1303  BP: (!) 152/80  Pulse: 67  SpO2: 96%  Weight: (!) 316 lb (143.3 kg)  Height: 5' 10 (1.778 m)   Body mass index is 45.34 kg/m.  Hearing/Vision screen Hearing Screening - Comments::  Denies hearing difficulties   Vision Screening - Comments:: Wears rx glasses - up to date with routine eye exams with Norleen Numbers and Harlene Bray Immunizations and Health Maintenance Health Maintenance  Topic Date Due   Zoster Vaccines- Shingrix (1 of 2) Never done   Colonoscopy  02/23/2022   OPHTHALMOLOGY EXAM  12/03/2023   DTaP/Tdap/Td (2 - Tdap) 03/06/2024   Diabetic kidney evaluation - Urine ACR  09/22/2024   HEMOGLOBIN A1C  09/22/2024   COVID-19 Vaccine (3 - 2025-26 season) 10/12/2024 (Originally 05/01/2024)   Influenza Vaccine  11/28/2024 (Originally 03/31/2024)   Diabetic kidney evaluation - eGFR measurement  03/22/2025   FOOT EXAM  03/22/2025   Lung Cancer Screening  08/04/2025   Medicare Annual Wellness (AWV)  09/29/2025   Pneumococcal Vaccine: 50+ Years  Completed   Hepatitis C Screening  Completed   Meningococcal B Vaccine  Aged Out        Assessment/Plan:  This is a routine wellness examination for William Hebert.  Patient Care Team: Rilla Baller, MD as PCP - General (Family Medicine) Ladona Heinz, MD (Cardiology) Deans, Norleen SAUNDERS, MD as Referring Physician (Ophthalmology)  I have personally reviewed and noted the following in the patients chart:   Medical and social history Use of alcohol, tobacco or illicit drugs  Current medications and supplements including opioid prescriptions. Functional ability and status Nutritional status Physical activity Advanced directives List of other physicians Hospitalizations, surgeries, and ER visits in previous 12 months Vitals Screenings to include cognitive, depression, and falls Referrals and appointments  No orders of the defined types were placed in this encounter.  In addition, I have reviewed and discussed with patient certain preventive protocols, quality metrics, and best practice recommendations. A written personalized care plan for preventive services as well as general preventive health recommendations were provided to  patient.   Verdie CHRISTELLA Saba, CMA   09/29/2024   Return in 1 year (on 09/29/2025).  After Visit Summary: (MyChart) Due to this being a  telephonic visit, the after visit summary with patients personalized plan was offered to patient via MyChart   Nurse Notes: scheduled 2027 AWV appt. "

## 2024-10-02 ENCOUNTER — Encounter: Admitting: Family Medicine

## 2024-10-18 ENCOUNTER — Encounter: Admitting: Family Medicine

## 2025-10-22 ENCOUNTER — Ambulatory Visit

## 2025-10-22 ENCOUNTER — Encounter: Admitting: Family Medicine
